# Patient Record
Sex: Female | Born: 1940 | Race: White | Hispanic: No | Marital: Single | Smoking: Former smoker
Health system: Southern US, Community
[De-identification: ages and names within clinical notes are randomized; demographics above are authoritative.]

## PROBLEM LIST (undated history)

## (undated) DIAGNOSIS — E039 Hypothyroidism, unspecified: Secondary | ICD-10-CM

## (undated) DIAGNOSIS — Z860100 Personal history of colon polyps, unspecified: Secondary | ICD-10-CM

## (undated) DIAGNOSIS — K589 Irritable bowel syndrome without diarrhea: Secondary | ICD-10-CM

## (undated) DIAGNOSIS — K5790 Diverticulosis of intestine, part unspecified, without perforation or abscess without bleeding: Secondary | ICD-10-CM

## (undated) DIAGNOSIS — D509 Iron deficiency anemia, unspecified: Secondary | ICD-10-CM

## (undated) DIAGNOSIS — E785 Hyperlipidemia, unspecified: Secondary | ICD-10-CM

## (undated) DIAGNOSIS — I10 Essential (primary) hypertension: Secondary | ICD-10-CM

## (undated) DIAGNOSIS — R7989 Other specified abnormal findings of blood chemistry: Secondary | ICD-10-CM

## (undated) DIAGNOSIS — C801 Malignant (primary) neoplasm, unspecified: Secondary | ICD-10-CM

## (undated) DIAGNOSIS — F419 Anxiety disorder, unspecified: Secondary | ICD-10-CM

## (undated) DIAGNOSIS — R945 Abnormal results of liver function studies: Secondary | ICD-10-CM

## (undated) DIAGNOSIS — M810 Age-related osteoporosis without current pathological fracture: Secondary | ICD-10-CM

## (undated) DIAGNOSIS — M1990 Unspecified osteoarthritis, unspecified site: Secondary | ICD-10-CM

## (undated) DIAGNOSIS — T7840XA Allergy, unspecified, initial encounter: Secondary | ICD-10-CM

## (undated) DIAGNOSIS — K219 Gastro-esophageal reflux disease without esophagitis: Secondary | ICD-10-CM

## (undated) DIAGNOSIS — R011 Cardiac murmur, unspecified: Secondary | ICD-10-CM

## (undated) DIAGNOSIS — H269 Unspecified cataract: Secondary | ICD-10-CM

## (undated) DIAGNOSIS — R519 Headache, unspecified: Secondary | ICD-10-CM

## (undated) DIAGNOSIS — F32A Depression, unspecified: Secondary | ICD-10-CM

## (undated) DIAGNOSIS — F329 Major depressive disorder, single episode, unspecified: Secondary | ICD-10-CM

## (undated) HISTORY — PX: GASTRIC BYPASS: SHX52

## (undated) HISTORY — DX: Cardiac murmur, unspecified: R01.1

## (undated) HISTORY — PX: BLADDER REPAIR: SHX76

## (undated) HISTORY — DX: Essential (primary) hypertension: I10

## (undated) HISTORY — DX: Iron deficiency anemia, unspecified: D50.9

## (undated) HISTORY — DX: Irritable bowel syndrome, unspecified: K58.9

## (undated) HISTORY — DX: Age-related osteoporosis without current pathological fracture: M81.0

## (undated) HISTORY — PX: ABDOMINAL HYSTERECTOMY: SUR658

## (undated) HISTORY — DX: Major depressive disorder, single episode, unspecified: F32.9

## (undated) HISTORY — DX: Unspecified osteoarthritis, unspecified site: M19.90

## (undated) HISTORY — PX: ABDOMINAL HYSTERECTOMY: SHX81

## (undated) HISTORY — DX: Unspecified cataract: H26.9

## (undated) HISTORY — PX: TONSILLECTOMY AND ADENOIDECTOMY: SUR1326

## (undated) HISTORY — DX: Gastro-esophageal reflux disease without esophagitis: K21.9

## (undated) HISTORY — PX: BREAST BIOPSY: SHX20

## (undated) HISTORY — PX: CARPAL TUNNEL RELEASE: SHX101

## (undated) HISTORY — PX: APPENDECTOMY: SHX54

## (undated) HISTORY — DX: Allergy, unspecified, initial encounter: T78.40XA

## (undated) HISTORY — DX: Personal history of colonic polyps: Z86.010

## (undated) HISTORY — PX: COLONOSCOPY: SHX174

## (undated) HISTORY — DX: Diverticulosis of intestine, part unspecified, without perforation or abscess without bleeding: K57.90

## (undated) HISTORY — DX: Anxiety disorder, unspecified: F41.9

## (undated) HISTORY — DX: Hyperlipidemia, unspecified: E78.5

## (undated) HISTORY — PX: CHOLECYSTECTOMY: SHX55

## (undated) HISTORY — DX: Other specified abnormal findings of blood chemistry: R79.89

## (undated) HISTORY — PX: POLYPECTOMY: SHX149

## (undated) HISTORY — DX: Depression, unspecified: F32.A

## (undated) HISTORY — DX: Personal history of colon polyps, unspecified: Z86.0100

## (undated) HISTORY — PX: CORONARY ANGIOPLASTY: SHX604

## (undated) HISTORY — DX: Hypothyroidism, unspecified: E03.9

## (undated) HISTORY — DX: Abnormal results of liver function studies: R94.5

## (undated) HISTORY — PX: CATARACT EXTRACTION: SUR2

---

## 1998-12-22 ENCOUNTER — Other Ambulatory Visit: Admission: RE | Admit: 1998-12-22 | Discharge: 1998-12-22 | Payer: Self-pay | Admitting: Gynecology

## 2000-03-10 ENCOUNTER — Other Ambulatory Visit: Admission: RE | Admit: 2000-03-10 | Discharge: 2000-03-10 | Payer: Self-pay | Admitting: Gynecology

## 2001-05-24 ENCOUNTER — Other Ambulatory Visit: Admission: RE | Admit: 2001-05-24 | Discharge: 2001-05-24 | Payer: Self-pay | Admitting: Gynecology

## 2002-10-29 ENCOUNTER — Other Ambulatory Visit: Admission: RE | Admit: 2002-10-29 | Discharge: 2002-10-29 | Payer: Self-pay | Admitting: Gynecology

## 2003-07-07 ENCOUNTER — Emergency Department (HOSPITAL_COMMUNITY): Admission: EM | Admit: 2003-07-07 | Discharge: 2003-07-07 | Payer: Self-pay | Admitting: Emergency Medicine

## 2003-07-07 ENCOUNTER — Encounter: Payer: Self-pay | Admitting: Emergency Medicine

## 2004-05-05 ENCOUNTER — Inpatient Hospital Stay (HOSPITAL_BASED_OUTPATIENT_CLINIC_OR_DEPARTMENT_OTHER): Admission: RE | Admit: 2004-05-05 | Discharge: 2004-05-05 | Payer: Self-pay | Admitting: *Deleted

## 2004-05-26 ENCOUNTER — Other Ambulatory Visit: Admission: RE | Admit: 2004-05-26 | Discharge: 2004-05-26 | Payer: Self-pay | Admitting: Gynecology

## 2004-06-19 ENCOUNTER — Encounter (INDEPENDENT_AMBULATORY_CARE_PROVIDER_SITE_OTHER): Payer: Self-pay | Admitting: *Deleted

## 2004-06-19 ENCOUNTER — Encounter: Admission: RE | Admit: 2004-06-19 | Discharge: 2004-06-19 | Payer: Self-pay | Admitting: Internal Medicine

## 2004-10-07 ENCOUNTER — Ambulatory Visit: Payer: Self-pay | Admitting: Internal Medicine

## 2004-10-26 ENCOUNTER — Ambulatory Visit: Payer: Self-pay | Admitting: Endocrinology

## 2004-11-04 ENCOUNTER — Ambulatory Visit: Payer: Self-pay | Admitting: Internal Medicine

## 2004-11-19 ENCOUNTER — Ambulatory Visit: Payer: Self-pay | Admitting: Family Medicine

## 2005-01-21 ENCOUNTER — Ambulatory Visit: Payer: Self-pay | Admitting: Family Medicine

## 2005-03-11 ENCOUNTER — Ambulatory Visit: Payer: Self-pay | Admitting: Family Medicine

## 2005-05-12 ENCOUNTER — Ambulatory Visit: Payer: Self-pay | Admitting: Internal Medicine

## 2005-05-12 ENCOUNTER — Other Ambulatory Visit: Admission: RE | Admit: 2005-05-12 | Discharge: 2005-05-12 | Payer: Self-pay | Admitting: Gynecology

## 2005-05-31 ENCOUNTER — Ambulatory Visit: Payer: Self-pay | Admitting: Internal Medicine

## 2005-06-04 ENCOUNTER — Ambulatory Visit: Payer: Self-pay | Admitting: Endocrinology

## 2005-06-22 ENCOUNTER — Ambulatory Visit: Payer: Self-pay | Admitting: Internal Medicine

## 2005-06-23 ENCOUNTER — Ambulatory Visit: Payer: Self-pay | Admitting: Internal Medicine

## 2005-06-23 ENCOUNTER — Encounter (INDEPENDENT_AMBULATORY_CARE_PROVIDER_SITE_OTHER): Payer: Self-pay | Admitting: Specialist

## 2005-06-23 ENCOUNTER — Encounter (INDEPENDENT_AMBULATORY_CARE_PROVIDER_SITE_OTHER): Payer: Self-pay | Admitting: *Deleted

## 2005-07-06 ENCOUNTER — Ambulatory Visit: Payer: Self-pay | Admitting: Family Medicine

## 2005-07-19 ENCOUNTER — Ambulatory Visit: Payer: Self-pay | Admitting: Family Medicine

## 2005-07-29 ENCOUNTER — Ambulatory Visit: Payer: Self-pay | Admitting: Family Medicine

## 2005-09-20 ENCOUNTER — Ambulatory Visit: Payer: Self-pay | Admitting: Internal Medicine

## 2005-11-10 ENCOUNTER — Ambulatory Visit: Payer: Self-pay | Admitting: Family Medicine

## 2005-11-18 ENCOUNTER — Ambulatory Visit: Payer: Self-pay | Admitting: Family Medicine

## 2005-11-30 ENCOUNTER — Ambulatory Visit: Payer: Self-pay

## 2006-01-04 ENCOUNTER — Other Ambulatory Visit: Admission: RE | Admit: 2006-01-04 | Discharge: 2006-01-04 | Payer: Self-pay | Admitting: Gynecology

## 2006-01-06 ENCOUNTER — Ambulatory Visit: Payer: Self-pay | Admitting: Family Medicine

## 2006-01-25 ENCOUNTER — Ambulatory Visit: Payer: Self-pay | Admitting: Family Medicine

## 2006-02-02 ENCOUNTER — Ambulatory Visit: Payer: Self-pay | Admitting: Hematology and Oncology

## 2006-03-03 LAB — UIFE/LIGHT CHAINS/TP QN, 24-HR UR
Free Kappa Lt Chains,Ur: 0.84 mg/dL (ref 0.04–1.51)
Free Kappa/Lambda Ratio: 4.94 ratio — ABNORMAL HIGH (ref 0.46–4.00)
Free Lt Chn Excr Rate: 21 mg/d
Total Protein, Urine: 1.3 mg/dL
Volume, Urine: 2500 mL

## 2006-03-18 ENCOUNTER — Ambulatory Visit: Payer: Self-pay | Admitting: Internal Medicine

## 2006-03-21 ENCOUNTER — Ambulatory Visit: Payer: Self-pay | Admitting: Hematology and Oncology

## 2006-03-21 ENCOUNTER — Ambulatory Visit (HOSPITAL_COMMUNITY): Admission: RE | Admit: 2006-03-21 | Discharge: 2006-03-21 | Payer: Self-pay | Admitting: Hematology and Oncology

## 2006-03-21 LAB — CBC WITH DIFFERENTIAL/PLATELET
BASO%: 0.5 % (ref 0.0–2.0)
Basophils Absolute: 0 10*3/uL (ref 0.0–0.1)
EOS%: 1.7 % (ref 0.0–7.0)
Eosinophils Absolute: 0.1 10*3/uL (ref 0.0–0.5)
HCT: 35.6 % (ref 34.8–46.6)
HGB: 12.1 g/dL (ref 11.6–15.9)
LYMPH%: 22.8 % (ref 14.0–48.0)
MCH: 30.1 pg (ref 26.0–34.0)
MCHC: 34.1 g/dL (ref 32.0–36.0)
MCV: 88.2 fL (ref 81.0–101.0)
MONO#: 0.4 10*3/uL (ref 0.1–0.9)
MONO%: 6.2 % (ref 0.0–13.0)
NEUT#: 4.5 10*3/uL (ref 1.5–6.5)
NEUT%: 68.8 % (ref 39.6–76.8)
Platelets: 159 10*3/uL (ref 145–400)
RBC: 4.04 10*6/uL (ref 3.70–5.32)
RDW: 15.3 % — ABNORMAL HIGH (ref 11.3–14.5)
WBC: 6.6 10*3/uL (ref 3.9–10.0)
lymph#: 1.5 10*3/uL (ref 0.9–3.3)

## 2006-03-21 LAB — IRON AND TIBC: Iron: 133 ug/dL (ref 42–145)

## 2006-03-21 LAB — FERRITIN: Ferritin: 73 ng/mL (ref 10–291)

## 2006-04-04 ENCOUNTER — Ambulatory Visit: Payer: Self-pay | Admitting: Family Medicine

## 2006-04-21 ENCOUNTER — Ambulatory Visit: Payer: Self-pay | Admitting: Family Medicine

## 2006-04-21 LAB — CBC WITH DIFFERENTIAL/PLATELET
Eosinophils Absolute: 0.1 10*3/uL (ref 0.0–0.5)
MCV: 90.2 fL (ref 81.0–101.0)
MONO%: 5.7 % (ref 0.0–13.0)
NEUT#: 4.6 10*3/uL (ref 1.5–6.5)
RBC: 4 10*6/uL (ref 3.70–5.32)
RDW: 14.6 % — ABNORMAL HIGH (ref 11.3–14.5)
WBC: 6.4 10*3/uL (ref 3.9–10.0)
lymph#: 1.4 10*3/uL (ref 0.9–3.3)

## 2006-04-21 LAB — FERRITIN: Ferritin: 77 ng/mL (ref 10–291)

## 2006-04-21 LAB — VITAMIN B12: Vitamin B-12: 482 pg/mL (ref 211–911)

## 2006-04-28 ENCOUNTER — Ambulatory Visit: Payer: Self-pay | Admitting: Family Medicine

## 2006-05-13 ENCOUNTER — Ambulatory Visit: Payer: Self-pay | Admitting: Hematology and Oncology

## 2006-05-17 LAB — CBC WITH DIFFERENTIAL/PLATELET
Eosinophils Absolute: 0.1 10*3/uL (ref 0.0–0.5)
HCT: 36.5 % (ref 34.8–46.6)
LYMPH%: 15.5 % (ref 14.0–48.0)
MCHC: 34.6 g/dL (ref 32.0–36.0)
MONO#: 0.3 10*3/uL (ref 0.1–0.9)
NEUT#: 5.5 10*3/uL (ref 1.5–6.5)
NEUT%: 77.7 % — ABNORMAL HIGH (ref 39.6–76.8)
Platelets: 155 10*3/uL (ref 145–400)
WBC: 7 10*3/uL (ref 3.9–10.0)

## 2006-06-27 ENCOUNTER — Other Ambulatory Visit: Admission: RE | Admit: 2006-06-27 | Discharge: 2006-06-27 | Payer: Self-pay | Admitting: Gynecology

## 2006-08-30 ENCOUNTER — Ambulatory Visit: Payer: Self-pay | Admitting: Endocrinology

## 2006-11-03 ENCOUNTER — Ambulatory Visit: Payer: Self-pay | Admitting: Family Medicine

## 2007-01-31 ENCOUNTER — Other Ambulatory Visit: Admission: RE | Admit: 2007-01-31 | Discharge: 2007-01-31 | Payer: Self-pay | Admitting: Gynecology

## 2007-03-14 DIAGNOSIS — J45909 Unspecified asthma, uncomplicated: Secondary | ICD-10-CM | POA: Insufficient documentation

## 2007-03-14 DIAGNOSIS — J3800 Paralysis of vocal cords and larynx, unspecified: Secondary | ICD-10-CM

## 2007-03-14 DIAGNOSIS — J384 Edema of larynx: Secondary | ICD-10-CM | POA: Insufficient documentation

## 2007-03-14 DIAGNOSIS — I10 Essential (primary) hypertension: Secondary | ICD-10-CM

## 2007-03-14 DIAGNOSIS — D126 Benign neoplasm of colon, unspecified: Secondary | ICD-10-CM | POA: Insufficient documentation

## 2007-03-14 DIAGNOSIS — E785 Hyperlipidemia, unspecified: Secondary | ICD-10-CM | POA: Insufficient documentation

## 2007-03-14 DIAGNOSIS — K219 Gastro-esophageal reflux disease without esophagitis: Secondary | ICD-10-CM

## 2007-03-14 DIAGNOSIS — Z9089 Acquired absence of other organs: Secondary | ICD-10-CM

## 2007-03-14 DIAGNOSIS — D509 Iron deficiency anemia, unspecified: Secondary | ICD-10-CM

## 2007-03-14 DIAGNOSIS — Z862 Personal history of diseases of the blood and blood-forming organs and certain disorders involving the immune mechanism: Secondary | ICD-10-CM

## 2007-03-14 DIAGNOSIS — Z8639 Personal history of other endocrine, nutritional and metabolic disease: Secondary | ICD-10-CM

## 2007-06-14 ENCOUNTER — Ambulatory Visit: Payer: Self-pay | Admitting: Internal Medicine

## 2007-06-14 DIAGNOSIS — R3989 Other symptoms and signs involving the genitourinary system: Secondary | ICD-10-CM | POA: Insufficient documentation

## 2007-06-14 DIAGNOSIS — IMO0001 Reserved for inherently not codable concepts without codable children: Secondary | ICD-10-CM

## 2007-06-15 ENCOUNTER — Encounter: Payer: Self-pay | Admitting: Family Medicine

## 2007-06-16 ENCOUNTER — Telehealth: Payer: Self-pay | Admitting: Family Medicine

## 2007-06-19 ENCOUNTER — Encounter (INDEPENDENT_AMBULATORY_CARE_PROVIDER_SITE_OTHER): Payer: Self-pay | Admitting: *Deleted

## 2007-06-20 ENCOUNTER — Ambulatory Visit: Payer: Self-pay | Admitting: Family Medicine

## 2007-06-23 ENCOUNTER — Telehealth (INDEPENDENT_AMBULATORY_CARE_PROVIDER_SITE_OTHER): Payer: Self-pay | Admitting: *Deleted

## 2007-06-23 LAB — CONVERTED CEMR LAB
Alkaline Phosphatase: 58 units/L (ref 39–117)
Basophils Absolute: 0 10*3/uL (ref 0.0–0.1)
Bilirubin, Direct: 0.1 mg/dL (ref 0.0–0.3)
Cholesterol: 160 mg/dL (ref 0–200)
Eosinophils Absolute: 0.1 10*3/uL (ref 0.0–0.6)
Eosinophils Relative: 2.2 % (ref 0.0–5.0)
HDL: 31.5 mg/dL — ABNORMAL LOW (ref >39.0)
Lymphocytes Relative: 41.3 % (ref 12.0–46.0)
MCHC: 34.7 g/dL (ref 30.0–36.0)
MCV: 91.6 fL (ref 78.0–100.0)
Neutro Abs: 1.9 10*3/uL (ref 1.4–7.7)
Platelets: 155 10*3/uL (ref 150–400)
T3, Free: 2.4 pg/mL (ref 2.3–4.2)
TSH: 0.71 microintl units/mL (ref 0.35–5.50)
Total Protein: 6.5 g/dL (ref 6.0–8.3)
Triglycerides: 108 mg/dL (ref 0–149)
WBC: 3.9 10*3/uL — ABNORMAL LOW (ref 4.5–10.5)

## 2007-07-18 ENCOUNTER — Other Ambulatory Visit: Admission: RE | Admit: 2007-07-18 | Discharge: 2007-07-18 | Payer: Self-pay | Admitting: Gynecology

## 2007-09-02 ENCOUNTER — Encounter: Payer: Self-pay | Admitting: *Deleted

## 2007-10-31 ENCOUNTER — Ambulatory Visit: Payer: Self-pay | Admitting: Family Medicine

## 2007-10-31 ENCOUNTER — Telehealth (INDEPENDENT_AMBULATORY_CARE_PROVIDER_SITE_OTHER): Payer: Self-pay | Admitting: *Deleted

## 2007-11-01 ENCOUNTER — Encounter: Payer: Self-pay | Admitting: Endocrinology

## 2008-01-23 ENCOUNTER — Telehealth (INDEPENDENT_AMBULATORY_CARE_PROVIDER_SITE_OTHER): Payer: Self-pay | Admitting: *Deleted

## 2008-01-23 ENCOUNTER — Ambulatory Visit: Payer: Self-pay | Admitting: Family Medicine

## 2008-01-23 LAB — CONVERTED CEMR LAB
Glucose, Urine, Semiquant: NEGATIVE
Ketones, urine, test strip: NEGATIVE
Nitrite: NEGATIVE
Rapid Strep: POSITIVE
WBC Urine, dipstick: NEGATIVE
pH: 6.5

## 2008-02-23 ENCOUNTER — Telehealth (INDEPENDENT_AMBULATORY_CARE_PROVIDER_SITE_OTHER): Payer: Self-pay | Admitting: *Deleted

## 2008-02-26 ENCOUNTER — Telehealth (INDEPENDENT_AMBULATORY_CARE_PROVIDER_SITE_OTHER): Payer: Self-pay | Admitting: *Deleted

## 2008-02-27 ENCOUNTER — Ambulatory Visit: Payer: Self-pay | Admitting: Internal Medicine

## 2008-05-17 ENCOUNTER — Encounter: Payer: Self-pay | Admitting: Family Medicine

## 2008-08-12 ENCOUNTER — Ambulatory Visit: Payer: Self-pay | Admitting: Vascular Surgery

## 2008-08-12 ENCOUNTER — Telehealth (INDEPENDENT_AMBULATORY_CARE_PROVIDER_SITE_OTHER): Payer: Self-pay | Admitting: *Deleted

## 2008-08-12 ENCOUNTER — Emergency Department (HOSPITAL_COMMUNITY): Admission: EM | Admit: 2008-08-12 | Discharge: 2008-08-12 | Payer: Self-pay | Admitting: Emergency Medicine

## 2008-08-12 ENCOUNTER — Encounter (INDEPENDENT_AMBULATORY_CARE_PROVIDER_SITE_OTHER): Payer: Self-pay | Admitting: Emergency Medicine

## 2008-08-13 ENCOUNTER — Telehealth: Payer: Self-pay | Admitting: Family Medicine

## 2008-08-14 ENCOUNTER — Ambulatory Visit: Payer: Self-pay | Admitting: Family Medicine

## 2008-08-14 DIAGNOSIS — R5383 Other fatigue: Secondary | ICD-10-CM | POA: Insufficient documentation

## 2008-08-14 DIAGNOSIS — R5381 Other malaise: Secondary | ICD-10-CM

## 2008-08-14 DIAGNOSIS — J984 Other disorders of lung: Secondary | ICD-10-CM | POA: Insufficient documentation

## 2008-08-14 LAB — CONVERTED CEMR LAB
ALT: 16 units/L (ref 0–35)
Alkaline Phosphatase: 54 units/L (ref 39–117)
Bilirubin, Direct: 0.1 mg/dL (ref 0.0–0.3)
CO2: 29 meq/L (ref 19–32)
Chloride: 109 meq/L (ref 96–112)
Glucose, Bld: 82 mg/dL (ref 70–99)
Lymphocytes Relative: 31.1 % (ref 12.0–46.0)
Monocytes Relative: 6.6 % (ref 3.0–12.0)
Platelets: 150 10*3/uL (ref 150–400)
Potassium: 4.7 meq/L (ref 3.5–5.1)
RDW: 12.5 % (ref 11.5–14.6)
Sodium: 140 meq/L (ref 135–145)
Total Protein: 6.8 g/dL (ref 6.0–8.3)

## 2008-08-15 ENCOUNTER — Encounter (INDEPENDENT_AMBULATORY_CARE_PROVIDER_SITE_OTHER): Payer: Self-pay | Admitting: *Deleted

## 2008-08-26 ENCOUNTER — Encounter (INDEPENDENT_AMBULATORY_CARE_PROVIDER_SITE_OTHER): Payer: Self-pay | Admitting: *Deleted

## 2008-08-26 ENCOUNTER — Telehealth (INDEPENDENT_AMBULATORY_CARE_PROVIDER_SITE_OTHER): Payer: Self-pay | Admitting: *Deleted

## 2008-09-02 ENCOUNTER — Telehealth (INDEPENDENT_AMBULATORY_CARE_PROVIDER_SITE_OTHER): Payer: Self-pay | Admitting: *Deleted

## 2008-09-10 ENCOUNTER — Ambulatory Visit: Payer: Self-pay | Admitting: Family Medicine

## 2008-09-10 DIAGNOSIS — E559 Vitamin D deficiency, unspecified: Secondary | ICD-10-CM

## 2008-09-10 DIAGNOSIS — I8390 Asymptomatic varicose veins of unspecified lower extremity: Secondary | ICD-10-CM

## 2008-10-22 ENCOUNTER — Telehealth (INDEPENDENT_AMBULATORY_CARE_PROVIDER_SITE_OTHER): Payer: Self-pay | Admitting: *Deleted

## 2008-11-14 ENCOUNTER — Ambulatory Visit: Payer: Self-pay | Admitting: Family Medicine

## 2008-11-15 ENCOUNTER — Encounter: Payer: Self-pay | Admitting: Family Medicine

## 2008-11-18 ENCOUNTER — Encounter (INDEPENDENT_AMBULATORY_CARE_PROVIDER_SITE_OTHER): Payer: Self-pay | Admitting: *Deleted

## 2009-02-25 ENCOUNTER — Telehealth (INDEPENDENT_AMBULATORY_CARE_PROVIDER_SITE_OTHER): Payer: Self-pay | Admitting: *Deleted

## 2009-02-25 DIAGNOSIS — Z9884 Bariatric surgery status: Secondary | ICD-10-CM | POA: Insufficient documentation

## 2009-02-27 ENCOUNTER — Ambulatory Visit: Payer: Self-pay | Admitting: Family Medicine

## 2009-03-08 LAB — CONVERTED CEMR LAB
Albumin: 3.9 g/dL (ref 3.5–5.2)
CO2: 30 meq/L (ref 19–32)
Chloride: 105 meq/L (ref 96–112)
Creatinine, Ser: 0.6 mg/dL (ref 0.4–1.2)
Eosinophils Relative: 2 % (ref 0.0–5.0)
HCT: 36.1 % (ref 36.0–46.0)
HDL: 36.1 mg/dL — ABNORMAL LOW (ref >39.00)
Iron: 96 ug/dL (ref 42–145)
LDL Cholesterol: 129 mg/dL — ABNORMAL HIGH (ref 0–99)
Lymphs Abs: 1.4 10*3/uL (ref 0.7–4.0)
MCV: 91.3 fL (ref 78.0–100.0)
Monocytes Absolute: 0.3 10*3/uL (ref 0.1–1.0)
Platelets: 138 10*3/uL — ABNORMAL LOW (ref 150.0–400.0)
Potassium: 4.7 meq/L (ref 3.5–5.1)
Sodium: 140 meq/L (ref 135–145)
Total CHOL/HDL Ratio: 5
Triglycerides: 142 mg/dL (ref 0.0–149.0)
VLDL: 28.4 mg/dL (ref 0.0–40.0)
Vit D, 25-Hydroxy: 38 ng/mL (ref 30–89)
Vitamin B-12: 575 pg/mL (ref 211–911)
WBC: 4.2 10*3/uL — ABNORMAL LOW (ref 4.5–10.5)

## 2009-03-10 ENCOUNTER — Encounter (INDEPENDENT_AMBULATORY_CARE_PROVIDER_SITE_OTHER): Payer: Self-pay | Admitting: *Deleted

## 2009-04-03 ENCOUNTER — Ambulatory Visit: Payer: Self-pay | Admitting: Family Medicine

## 2009-04-03 LAB — CONVERTED CEMR LAB
Basophils Relative: 0.8 % (ref 0.0–3.0)
Hemoglobin: 12.6 g/dL (ref 12.0–15.0)
Lymphocytes Relative: 30.9 % (ref 12.0–46.0)
Monocytes Relative: 4.8 % (ref 3.0–12.0)
Neutro Abs: 2.9 10*3/uL (ref 1.4–7.7)
Neutrophils Relative %: 61.7 % (ref 43.0–77.0)
RBC: 4.01 M/uL (ref 3.87–5.11)
WBC: 4.6 10*3/uL (ref 4.5–10.5)

## 2009-04-04 ENCOUNTER — Encounter (INDEPENDENT_AMBULATORY_CARE_PROVIDER_SITE_OTHER): Payer: Self-pay | Admitting: *Deleted

## 2009-04-21 ENCOUNTER — Encounter: Payer: Self-pay | Admitting: Family Medicine

## 2009-05-14 ENCOUNTER — Ambulatory Visit: Payer: Self-pay | Admitting: Internal Medicine

## 2009-05-16 ENCOUNTER — Encounter: Payer: Self-pay | Admitting: Family Medicine

## 2009-05-26 ENCOUNTER — Telehealth (INDEPENDENT_AMBULATORY_CARE_PROVIDER_SITE_OTHER): Payer: Self-pay | Admitting: *Deleted

## 2009-06-03 ENCOUNTER — Telehealth (INDEPENDENT_AMBULATORY_CARE_PROVIDER_SITE_OTHER): Payer: Self-pay | Admitting: *Deleted

## 2009-06-26 ENCOUNTER — Encounter: Payer: Self-pay | Admitting: Family Medicine

## 2009-07-02 ENCOUNTER — Ambulatory Visit: Payer: Self-pay | Admitting: Family Medicine

## 2009-07-02 ENCOUNTER — Encounter: Admission: RE | Admit: 2009-07-02 | Discharge: 2009-07-02 | Payer: Self-pay | Admitting: Family Medicine

## 2009-07-02 LAB — CONVERTED CEMR LAB
Bilirubin Urine: NEGATIVE
Blood in Urine, dipstick: NEGATIVE
Ketones, urine, test strip: NEGATIVE
Nitrite: NEGATIVE
Specific Gravity, Urine: <1.005
Urobilinogen, UA: 1

## 2009-07-03 ENCOUNTER — Encounter (INDEPENDENT_AMBULATORY_CARE_PROVIDER_SITE_OTHER): Payer: Self-pay | Admitting: *Deleted

## 2009-07-03 ENCOUNTER — Telehealth (INDEPENDENT_AMBULATORY_CARE_PROVIDER_SITE_OTHER): Payer: Self-pay | Admitting: *Deleted

## 2009-07-04 LAB — CONVERTED CEMR LAB
ALT: 153 units/L — ABNORMAL HIGH (ref 0–35)
AST: 189 units/L — ABNORMAL HIGH (ref 0–37)
Albumin: 4.1 g/dL (ref 3.5–5.2)
Amylase: 75 units/L (ref 27–131)
BUN: 9 mg/dL (ref 6–23)
Basophils Relative: 0 % (ref 0.0–3.0)
Chloride: 101 meq/L (ref 96–112)
Eosinophils Relative: 0.3 % (ref 0.0–5.0)
GFR calc non Af Amer: 106.27 mL/min (ref >60)
Glucose, Bld: 115 mg/dL — ABNORMAL HIGH (ref 70–99)
HCT: 36.6 % (ref 36.0–46.0)
Hemoglobin: 12.9 g/dL (ref 12.0–15.0)
Lipase: 30 units/L (ref 11.0–59.0)
Lymphs Abs: 0.6 10*3/uL — ABNORMAL LOW (ref 0.7–4.0)
MCV: 89.7 fL (ref 78.0–100.0)
Monocytes Absolute: 0.3 10*3/uL (ref 0.1–1.0)
Monocytes Relative: 4.9 % (ref 3.0–12.0)
Neutro Abs: 5.5 10*3/uL (ref 1.4–7.7)
Platelets: 132 10*3/uL — ABNORMAL LOW (ref 150.0–400.0)
Potassium: 3.9 meq/L (ref 3.5–5.1)
Sodium: 138 meq/L (ref 135–145)
Total Bilirubin: 1.5 mg/dL — ABNORMAL HIGH (ref 0.3–1.2)
Total Protein: 7.2 g/dL (ref 6.0–8.3)
WBC: 6.4 10*3/uL (ref 4.5–10.5)

## 2009-07-14 ENCOUNTER — Encounter: Payer: Self-pay | Admitting: Family Medicine

## 2009-07-16 ENCOUNTER — Encounter: Admission: RE | Admit: 2009-07-16 | Discharge: 2009-07-16 | Payer: Self-pay | Admitting: General Surgery

## 2009-07-16 ENCOUNTER — Ambulatory Visit: Payer: Self-pay | Admitting: Internal Medicine

## 2009-07-16 LAB — CONVERTED CEMR LAB
Bilirubin Urine: NEGATIVE
Nitrite: POSITIVE
Protein, U semiquant: NEGATIVE
Urobilinogen, UA: 0.2

## 2009-07-21 ENCOUNTER — Telehealth (INDEPENDENT_AMBULATORY_CARE_PROVIDER_SITE_OTHER): Payer: Self-pay | Admitting: *Deleted

## 2009-07-23 ENCOUNTER — Encounter (INDEPENDENT_AMBULATORY_CARE_PROVIDER_SITE_OTHER): Payer: Self-pay | Admitting: *Deleted

## 2009-07-23 ENCOUNTER — Encounter: Payer: Self-pay | Admitting: Family Medicine

## 2009-07-30 ENCOUNTER — Encounter: Payer: Self-pay | Admitting: Family Medicine

## 2009-09-03 ENCOUNTER — Telehealth (INDEPENDENT_AMBULATORY_CARE_PROVIDER_SITE_OTHER): Payer: Self-pay | Admitting: *Deleted

## 2009-09-04 ENCOUNTER — Encounter: Payer: Self-pay | Admitting: Family Medicine

## 2009-09-29 ENCOUNTER — Telehealth: Payer: Self-pay | Admitting: Family Medicine

## 2009-10-09 ENCOUNTER — Ambulatory Visit: Payer: Self-pay | Admitting: Internal Medicine

## 2009-11-19 ENCOUNTER — Ambulatory Visit: Payer: Self-pay | Admitting: Internal Medicine

## 2009-11-24 ENCOUNTER — Encounter (INDEPENDENT_AMBULATORY_CARE_PROVIDER_SITE_OTHER): Payer: Self-pay | Admitting: *Deleted

## 2009-11-25 LAB — CONVERTED CEMR LAB: TSH: 0.59 microintl units/mL (ref 0.35–5.50)

## 2009-12-03 ENCOUNTER — Telehealth (INDEPENDENT_AMBULATORY_CARE_PROVIDER_SITE_OTHER): Payer: Self-pay | Admitting: *Deleted

## 2010-01-14 ENCOUNTER — Ambulatory Visit: Payer: Self-pay | Admitting: Internal Medicine

## 2010-01-14 DIAGNOSIS — E039 Hypothyroidism, unspecified: Secondary | ICD-10-CM

## 2010-01-29 ENCOUNTER — Telehealth (INDEPENDENT_AMBULATORY_CARE_PROVIDER_SITE_OTHER): Payer: Self-pay | Admitting: *Deleted

## 2010-02-16 ENCOUNTER — Telehealth: Payer: Self-pay | Admitting: Family Medicine

## 2010-02-16 DIAGNOSIS — J312 Chronic pharyngitis: Secondary | ICD-10-CM | POA: Insufficient documentation

## 2010-02-20 ENCOUNTER — Encounter: Payer: Self-pay | Admitting: Family Medicine

## 2010-03-09 ENCOUNTER — Encounter: Payer: Self-pay | Admitting: Family Medicine

## 2010-03-17 ENCOUNTER — Encounter: Payer: Self-pay | Admitting: Internal Medicine

## 2010-06-04 ENCOUNTER — Telehealth (INDEPENDENT_AMBULATORY_CARE_PROVIDER_SITE_OTHER): Payer: Self-pay | Admitting: *Deleted

## 2010-06-09 ENCOUNTER — Encounter (INDEPENDENT_AMBULATORY_CARE_PROVIDER_SITE_OTHER): Payer: Self-pay | Admitting: *Deleted

## 2010-08-05 ENCOUNTER — Observation Stay (HOSPITAL_COMMUNITY): Admission: EM | Admit: 2010-08-05 | Discharge: 2010-08-06 | Payer: Self-pay | Admitting: Emergency Medicine

## 2010-08-10 ENCOUNTER — Encounter: Payer: Self-pay | Admitting: Internal Medicine

## 2010-08-26 ENCOUNTER — Ambulatory Visit: Payer: Self-pay | Admitting: Internal Medicine

## 2010-10-13 ENCOUNTER — Ambulatory Visit: Payer: Self-pay | Admitting: Family Medicine

## 2010-10-13 ENCOUNTER — Telehealth: Payer: Self-pay | Admitting: Family Medicine

## 2010-10-13 ENCOUNTER — Encounter (INDEPENDENT_AMBULATORY_CARE_PROVIDER_SITE_OTHER): Payer: Self-pay | Admitting: *Deleted

## 2010-10-13 DIAGNOSIS — R197 Diarrhea, unspecified: Secondary | ICD-10-CM

## 2010-10-13 DIAGNOSIS — F411 Generalized anxiety disorder: Secondary | ICD-10-CM | POA: Insufficient documentation

## 2010-10-13 DIAGNOSIS — R233 Spontaneous ecchymoses: Secondary | ICD-10-CM

## 2010-10-14 ENCOUNTER — Encounter: Payer: Self-pay | Admitting: Family Medicine

## 2010-10-18 LAB — CONVERTED CEMR LAB
ALT: 14 units/L (ref 0–35)
AST: 20 units/L (ref 0–37)
Albumin: 4 g/dL (ref 3.5–5.2)
BUN: 14 mg/dL (ref 6–23)
Basophils Absolute: 0 10*3/uL (ref 0.0–0.1)
Chloride: 100 meq/L (ref 96–112)
Eosinophils Relative: 1.7 % (ref 0.0–5.0)
GFR calc non Af Amer: 84.42 mL/min (ref >60)
Glucose, Bld: 78 mg/dL (ref 70–99)
HCT: 34.4 % — ABNORMAL LOW (ref 36.0–46.0)
Hemoglobin: 11.8 g/dL — ABNORMAL LOW (ref 12.0–15.0)
Lymphs Abs: 1.8 10*3/uL (ref 0.7–4.0)
MCV: 91.9 fL (ref 78.0–100.0)
Monocytes Absolute: 0.3 10*3/uL (ref 0.1–1.0)
Monocytes Relative: 6.7 % (ref 3.0–12.0)
Neutro Abs: 2.8 10*3/uL (ref 1.4–7.7)
Platelets: 187 10*3/uL (ref 150.0–400.0)
Potassium: 4.2 meq/L (ref 3.5–5.1)
Prothrombin Time: 10.3 s (ref 9.7–11.8)
RDW: 13.9 % (ref 11.5–14.6)
Sodium: 134 meq/L — ABNORMAL LOW (ref 135–145)
TSH: 0.73 microintl units/mL (ref 0.35–5.50)

## 2010-11-10 ENCOUNTER — Ambulatory Visit: Payer: Self-pay | Admitting: Family Medicine

## 2010-11-11 ENCOUNTER — Telehealth: Payer: Self-pay | Admitting: Family Medicine

## 2010-12-02 ENCOUNTER — Ambulatory Visit: Admit: 2010-12-02 | Payer: Self-pay | Admitting: Internal Medicine

## 2010-12-17 ENCOUNTER — Telehealth: Payer: Self-pay | Admitting: Family Medicine

## 2010-12-29 NOTE — Progress Notes (Signed)
Summary: Phone-synthroid  Phone Note Call from Patient   Caller: other/Pharmacy Summary of Call: Patient is at the pharmacy stating her prescription for synthroid is suppose to be changed to a new strenght. She has some blood work done.on Dec 22,2011. Patient is requesting a call back. Pharmacy is [phone] and fax # [phone] Initial call taken by: Barb Merino,  December 03, 2009 4:38 PM  Follow-up for Phone Call        spoke w/ pharmacy per Hop recheck labs in 8 weeks..........Marland KitchenDoristine Devoid  December 03, 2009 4:58 PM     New/Updated Medications: SYNTHROID 125 MCG TABS (LEVOTHYROXINE SODIUM) 1 by mouth once daily except 1/2 on wed. Prescriptions: SYNTHROID 125 MCG TABS (LEVOTHYROXINE SODIUM) 1 by mouth once daily except 1/2 on wed.  #30 x 0   Entered by:   Doristine Devoid   Authorized by:   Marga Melnick MD   Signed by:   Doristine Devoid on 12/03/2009   Method used:   Electronically to        [facility]* (retail)       [address]       [place], [place]  [address]       Ph: [phone]       Fax: [phone]   RxID:   [id number]

## 2010-12-29 NOTE — Assessment & Plan Note (Signed)
Summary: digestion problems; bruising per ob-gyn///sph   Vital Signs:  Patient profile:   70 year old female Weight:      171.4 pounds Temp:     98.2 degrees F oral Pulse rate:   64 / minute Pulse rhythm:   regular BP sitting:   110 / 64  (left arm) Cuff size:   large  Vitals Entered By: Almeta Monas CMA Duncan Dull) (October 13, 2010 3:44 PM) CC: c/o anxiety, feeling exhausted, bruising and diarrhea   History of Present Illness: Pt here with multiple complaints.  Pt is still c/o diarrhea ---see last visit.  She has spoken with [facility] (gastric bypass) and they told her to take Immodium and speak to GB surgeon---gb taken out 1 year ago---but she has not spoken to her yet.  Pt did have diarrhea before gb out but not to the extreme it has been since GB surgery.   Pt is also due for colonoscopy.  Pt is also c/o increased anxiety since children and grandchildren have moved back. --- Her children have said she needs to do something about it because she is too nervous around grandchildren.     GyN noticed bruising and asked her to come here to have platlets checked.     Current Medications (verified): 1)  Prilosec Otc 20 Mg Tbec (Omeprazole Magnesium) .... Take 2 Tablets Daily As   Directed To Protectstomach Lining. 2)  Clarinex 5 Mg  Tabs (Desloratadine) .Marland Kitchen.. 1 By Mouth Once Daily Prn 3)  Synthroid 125 Mcg Tabs (Levothyroxine Sodium) .Marland Kitchen.. 1 By Mouth Once Daily . 4)  Proventil Hfa 108 (90 Base) Mcg/act  Aers (Albuterol Sulfate) .... 2 Puffs Qid As Needed 5)  Vitamin D 1000 Unit Tabs (Cholecalciferol) .Marland Kitchen.. 1 By Mouth Once Daily 6)  Symbicort 160-4.5 Mcg/act Aero (Budesonide-Formoterol Fumarate) .Marland Kitchen.. 1-2 Puffs Q 12 Hrs As Needed 7)  Gas-X Extra Strength 62.5 Mg Strp (Simethicone) .... As Needed 8)  Tylenol 325 Mg Tabs (Acetaminophen) .... As Needed 9)  Mucinex 600 Mg Xr12h-Tab (Guaifenesin) .Marland Kitchen.. 1 By Mouth Once Daily 10)  Vitamin B-12 250 Mcg Tabs (Cyanocobalamin) .Marland Kitchen.. 1 By Mouth Once Daily 11)   Multivitamins  Tabs (Multiple Vitamin) .Marland Kitchen.. 1 By Mouth Once Daily 12)  Hyoscyamine Sulfate 0.125 Mg Subl (Hyoscyamine Sulfate) .Marland Kitchen.. 1 Under [place] Every 6 Hrs As Needed For Cramping 13)  Celexa 20 Mg Tabs (Citalopram Hydrobromide) .Marland Kitchen.. 1 By Mouth Once Daily 14)  Alprazolam 0.25 Mg Tabs (Alprazolam) .Marland Kitchen.. 1 By Mouth Three Times A Day As Needed  Allergies (verified): 1)  ! * Seconal 2)  ! Codeine 3)  ! * Aqueous Penicillin 4)  ! * Singulair 5)  ! * Actos 6)  ! * Avandia 7)  ! 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) TONSILLECTOMY AND ADENOIDECTOMY, HX OF (ICD-V45.79) LIVER FUNCTION TESTS, ABNORMAL, HX OF (ICD-V12.2) HYPERTENSION (ICD-401.9) HYPERLIPIDEMIA (ICD-272.4) DIABETES MELLITUS, TYPE II (ICD-250.00) ASTHMA (ICD-493.90) ANEMIA-IRON DEFICIENCY (ICD-280.9)  Past Surgical History: Last updated: 09/02/2007 Hysterectomy Appendectomy Gastric bypass Percutaneous transluminal coronary angioplasty  Risk Factors: Smoking Status: quit (09/02/2007)  Review of Systems      See HPI  Physical Exam  General:  Well-developed,well-nourished,in no acute distress;  alert,appropriate and cooperative throughout examination Abdomen:  soft and non-tender.   Skin:  Intact without suspicious lesions or rashes Psych:  Oriented X3 and normally interactive.     Impression & Recommendations:  Problem # 1:  DIARRHEA, CHRONIC (ICD-787.91)  Orders: Venipuncture (21308) TLB-BMP (Basic Metabolic Panel-BMET) (80048-METABOL) TLB-CBC Platelet  - w/Differential (85025-CBCD) TLB-Hepatic/Liver Function Pnl (80076-HEPATIC) TLB-TSH (Thyroid Stimulating Hormone) (84443-TSH) TLB-PT (Protime) (85610-PTP) TLB-PTT (85730-PTTL) T-Culture, C-Diff Toxin A/B (65784-69629) Specimen Handling (52841) Gastroenterology Referral (GI)  Discussed symptom control and diet. Call if worsening of symptoms or signs of dehydration.   Problem # 2:  SPONTANEOUS ECCHYMOSES (ICD-782.7) Assessment: Improved  Orders: Venipuncture (32440) TLB-BMP (Basic Metabolic Panel-BMET) (80048-METABOL) TLB-CBC Platelet - w/Differential (85025-CBCD) TLB-Hepatic/Liver Function Pnl (80076-HEPATIC) TLB-TSH (Thyroid Stimulating Hormone) (84443-TSH) TLB-PT (Protime) (85610-PTP) TLB-PTT (85730-PTTL) Specimen Handling ([address])  Problem # 3:  GERD (ICD-530.81)  Her updated medication list for this problem includes:    Prilosec Otc 20 Mg Tbec (Omeprazole magnesium) .Marland Kitchen... Take 2 tablets daily as   directed to protectstomach lining.    Hyoscyamine Sulfate 0.125 Mg Subl (Hyoscyamine sulfate) .Marland Kitchen... 1 under tonge every 6 hrs as needed for cramping  Diagnostics Reviewed:  EGD: Location: [facility]  GERD (06/23/2005) Discussed lifestyle modifications, diet, antacids/medications, and preventive measures. Handout provided.   Problem # 4:  ANXIETY STATE, UNSPECIFIED (ICD-300.00)  Her updated medication list for this problem includes:    Celexa 20 Mg Tabs (Citalopram hydrobromide) .Marland Kitchen... 1 by mouth once daily    Alprazolam 0.25 Mg Tabs (Alprazolam) .Marland Kitchen... 1 by mouth three times a day as needed  Discussed medication use and relaxation techniques.   Orders: Prescription Created Electronically [phone]  Complete Medication List: 1)  Prilosec Otc 20 Mg Tbec (Omeprazole magnesium) .... Take 2 tablets daily as   directed to protectstomach lining. 2)  Clarinex 5 Mg Tabs (Desloratadine) .Marland Kitchen.. 1 by mouth once daily prn 3)  Synthroid 125 Mcg Tabs (Levothyroxine sodium) .Marland Kitchen..  1 by mouth once daily . 4)  Proventil Hfa 108 (90 Base) Mcg/act Aers (Albuterol sulfate) .... 2 puffs qid as needed 5)  Vitamin D 1000 Unit Tabs (cholecalciferol)  .Marland Kitchen.. 1 by mouth once daily 6)  Symbicort 160-4.5 Mcg/act Aero (Budesonide-formoterol fumarate) .Marland Kitchen.. 1-2 puffs q 12 hrs as needed 7)  Gas-x Extra Strength 62.5 Mg Strp (Simethicone) .... As needed 8)  Tylenol 325 Mg Tabs (Acetaminophen) .... As needed 9)  Mucinex 600 Mg Xr12h-tab (Guaifenesin) .Marland Kitchen.. 1 by mouth once daily 10)  Vitamin B-12 250 Mcg Tabs (Cyanocobalamin) .Marland Kitchen.. 1 by mouth once daily 11)  Multivitamins Tabs (Multiple vitamin) .Marland Kitchen.. 1 by mouth once daily 12)  Hyoscyamine Sulfate 0.125 Mg Subl (Hyoscyamine sulfate) .Marland Kitchen.. 1 under tonge every 6 hrs as needed for cramping 13)  Celexa 20 Mg Tabs (Citalopram hydrobromide) .Marland Kitchen.. 1 by mouth once daily 14)  Alprazolam 0.25 Mg Tabs (Alprazolam) .Marland Kitchen.. 1 by mouth three times a day as needed  Patient Instructions: 1)  take 1/2 celexa daily for 8 days then increase to 1 tab a day 2)  Please schedule a follow-up appointment in 1 month.  Prescriptions: SYMBICORT 160-4.5 MCG/ACT AERO (BUDESONIDE-FORMOTEROL FUMARATE) 1-2 puffs q 12 hrs as needed  #1 x 5   Entered and Authorized by:   Loreen Freud DO   Signed by:   Loreen Freud DO on 10/13/2010   Method used:   Electronically to        [facility]* (retail)       [address]  [place], [place]  [address]       Ph: [phone]       Fax: [phone]   RxID:   [id number] PROVENTIL HFA 108 (90 BASE) MCG/ACT  AERS (ALBUTEROL SULFATE) 2 puffs qid as needed  #1 x 1   Entered and Authorized by:   Loreen Freud DO   Signed by:   Loreen Freud DO on 10/13/2010   Method used:   Electronically to        [facility]* (retail)       [address]       [place], [place]  [id number]       Ph: [phone]       Fax: [phone]   RxID:   [id number] ALPRAZOLAM 0.25 MG TABS (ALPRAZOLAM) 1 by mouth three  times a day as needed  #60 x 0   Entered and Authorized by:   Loreen Freud DO   Signed by:   Loreen Freud DO on 10/13/2010   Method used:   Print then Give to Patient   RxID:   [id number] CELEXA 20 MG TABS (CITALOPRAM HYDROBROMIDE) 1 by mouth once daily  #30 x 1   Entered and Authorized by:   Loreen Freud DO   Signed by:   Loreen Freud DO on 10/13/2010   Method used:   Electronically to        [facility]* (retail)       [address]       [place], [place]  [address]       Ph: [phone]       Fax: [phone]   RxID:   [id number]    Orders Added: 1)  Venipuncture [93818] 2)  TLB-BMP (Basic Metabolic Panel-BMET) [80048-METABOL] 3)  TLB-CBC Platelet - w/Differential [85025-CBCD] 4)  TLB-Hepatic/Liver Function Pnl [80076-HEPATIC] 5)  TLB-TSH (Thyroid Stimulating Hormone) [84443-TSH] 6)  TLB-PT (Protime) [85610-PTP] 7)  TLB-PTT [85730-PTTL] 8)  T-Culture, C-Diff Toxin A/B [29937-16967] 9)  Specimen Handling [99000] 10)  Gastroenterology Referral [GI] 11)  Est. Patient Level III [89381] 12)  Prescription Created Electronically [phone] [phone]

## 2010-12-29 NOTE — Consult Note (Signed)
Summary: [facility]  [facility]   Imported By: Lanelle Bal 03/23/2010 13:11:52  _____________________________________________________________________  External Attachment:    Type:   Image     Comment:   External Document

## 2010-12-29 NOTE — Progress Notes (Signed)
Summary: -no better  Phone Note Call from Patient Call back at Home Phone [phone]   Caller: Patient Summary of Call: Pt was seen for sinius infection on 01-14-10 and is no bette . pt still c/o throat irrational and chest congestion.pt was rx CEFUROXIME AXETIL 500 MG...............Marland KitchenFelecia Deloach CMA  January 29, 2010 4:08 PM   Follow-up for Phone Call        per dr hopper METRONIDAZOLE 500 MG #21...............Marland KitchenFelecia Deloach CMA  January 29, 2010 4:33 PM   left message to call office...............Marland KitchenFelecia Deloach CMA  January 29, 2010 4:34 PM   pt aware rx sent to pharmacy...........Marland KitchenFelecia Deloach CMA  January 29, 2010 4:39 PM     New/Updated Medications: METRONIDAZOLE 500 MG TABS (METRONIDAZOLE) Take 1 tab three times a day Prescriptions: METRONIDAZOLE 500 MG TABS (METRONIDAZOLE) Take 1 tab three times a day  #21 x 0   Entered by:   Jeremy Johann CMA   Authorized by:   Marga Melnick MD   Signed by:   Jeremy Johann CMA on 01/29/2010   Method used:   Faxed to ...       [facility]* (retail)       [address]       [place], [place]  [address]       Ph: [phone]       Fax: [phone]   RxID:   [id number]

## 2010-12-29 NOTE — Progress Notes (Signed)
Summary: chest pains  Phone Note Call from Patient Call back at [facility] Phone [phone]   Caller: Patient Summary of Call: Dr. Alwyn Ren Patient called yesterday with chest pains.... and it was recommended to her that she go to the ED. She went yesterday and was told to follow up today or tomorrow with her doctor. Michon is still having chest pains today. Initial call taken by: Charolette Child,  August 13, 2008 10:42 AM  Follow-up for Phone Call        Spoke with patient, still with chest pain-same as yesterday and headache. Patient was told to follow-up with primary with-in a day or two (Dr.Hopper is out of the office this week). No avaliable  appointments today. Patient was instructed to return to Emergency Room being that she is still in pain. Patient refused and said she was just there and was told all reports normal. Patient would like to see Dr.Lowne (patient seen Dr.Lowne before). Records from hospital printed and placed on ledge for Dr.Lowne to review . Per Marcelino Duster ok to place patient in 11:15 slot for tomorrow. Follow-up by: Shonna Chock,  August 13, 2008 10:57 AM  Additional Follow-up for Phone Call Additional follow up Details #1::        Spoke with patient again, Dr.Lowne still in room with patient. Patient was instructed to return to emergency room again and refused. Patient said she is ok to see Dr.Lowne @ 11:15am tomorrow. I will still have Dr.Lowne review phone note and records from the hospital and if futher information/instruction given I will call patient back. Dr.Lowne please review. Additional Follow-up by: Shonna Chock,  August 13, 2008 11:02 AM    Additional Follow-up for Phone Call Additional follow up Details #2::    If pt refusing to go back to ER we will see her in the office. Follow-up by: Loreen Freud DO,  August 13, 2008 1:02 PM

## 2010-12-29 NOTE — Progress Notes (Signed)
Summary: REFILL REQUEST  Phone Note Refill Request Call back at [phone] Message from:  Pharmacy on June 04, 2010 2:36 PM  Refills Requested: Medication #1:  SYNTHROID 125 MCG TABS 1 by mouth once daily .   Dosage confirmed as above?Dosage Confirmed   Supply Requested: 3 months   Last Refilled: 03/17/2010 [facility]  Next Appointment Scheduled: NONE Initial call taken by: Lavell Islam,  June 04, 2010 2:36 PM    Prescriptions: SYNTHROID 125 MCG TABS (LEVOTHYROXINE SODIUM) 1 by mouth once daily .  #90 x 1   Entered by:   Jeremy Johann CMA   Authorized by:   Loreen Freud DO   Signed by:   Jeremy Johann CMA on 06/04/2010   Method used:   Re-Faxed to ...       [facility]* (retail)       [address]       [place], [place]  [address]       Ph: [phone]       Fax: [phone]   RxID:   [id number]

## 2010-12-29 NOTE — Letter (Signed)
Summary: New Patient letter  [facility]  [address] [place], [place] [address]   Phone: [phone]  Fax: [phone]       10/13/2010 MRN: [id number]  Stacy Ayers [address] [place], [place]  [address]  Dear [name],  Welcome to the Gastroenterology Division at [facility].    You are scheduled to see Dr. Marina Goodell on 12/02/2010 at 9:15AM on the 3rd floor at [facility], [address]  We ask that you try to arrive at our office 15 minutes prior to your appointment time to allow for check-in.  We would like you to complete the enclosed self-administered evaluation form prior to your visit and bring it with you on the day of your appointment.  We will review it with you.  Also, please bring a complete list of all your medications or, if you prefer, bring the medication bottles and we will list them.  Please bring your insurance card so that we may make a copy of it.  If your insurance requires a referral to see a specialist, please bring your referral form from your primary care physician.  Co-payments are due at the time of your visit and may be paid by cash, check or credit card.     Your office visit will consist of a consult with your physician (includes a physical exam), any laboratory testing he/she may order, scheduling of any necessary diagnostic testing (e.g. x-ray, ultrasound, CT-scan), and scheduling of a procedure (e.g. Endoscopy, Colonoscopy) if required.  Please allow enough time on your schedule to allow for any/all of these possibilities.    If you cannot keep your appointment, please call [phone] to cancel or reschedule prior to your appointment date.  This allows [place] the opportunity to schedule an appointment for another patient in need of care.  If you do not cancel or reschedule by 5 p.m. the business day prior to your appointment date, you will be charged a $50.00 late cancellation/no-show fee.    Thank you for choosing [facility] for your medical needs.  We appreciate the opportunity to care for you.  Please visit [place] at our website  to learn more about our practice.                     Sincerely,                                                             The Gastroenterology Division

## 2010-12-29 NOTE — Assessment & Plan Note (Signed)
Summary: HOSP FOLLOW UP//PH   Vital Signs:  Patient profile:   70 year old female Weight:      168.4 pounds BMI:     25.70 Temp:     98.8 degrees F oral Pulse rate:   64 / minute Resp:     15 per minute BP sitting:   122 / 84  (left arm) Cuff size:   large  Vitals Entered By: Shonna Chock CMA (August 26, 2010 1:14 PM) CC: Hospital Follow-up, Heartburn, Diarrhea   Primary Care Provider:  [facility]  CC:  Hospital Follow-up, Heartburn, and Diarrhea.  History of Present Illness:    D/C Summary reviewed ; symptoms lasted 15 min & occurred  45-60 min after large meal. No recurrence since D/C despite Omeprazole once daily only rather than two times a day as Rxed.  The patient reports occasional  trouble swallowing, but denies acid reflux, sour taste in mouth, epigastric pain, and chest pain.  The patient denies the following alarm features: melena, frank dysphagia, hematemesis, and vomiting.        The patient also presents with Diarrhea intermittently since bypass surgery 2007, but worse after cholecystectomy 06/2009.  The patient reports 3 stools or less per day, watery/unformed stools, voluminous stools, greasy stools, malodorous stools, fecal urgency,  fecal soiling X 1, bloating, gassiness, and gradual onset of symptoms, but denies mucus in stool and fasting diarrhea.  Associated symptoms include abdominal cramps and weight loss.  The patient denies fever, lightheadedness, increased thirst, joint pains, mouth ulcers, and eye redness.  The symptoms are worse  especially after b'fast. The symptoms are better with hypomotility agents (Immodium AD) & Gas-Ex.  Patient has a  history of cholecystectomy and gastric  bypass surgery as noted.    Current Medications (verified): 1)  Prilosec Otc 20 Mg Tbec (Omeprazole Magnesium) .... Take 1 Tablet Daily As   Directed To Protectstomach Lining. 2)  Clarinex 5 Mg  Tabs (Desloratadine) .Marland Kitchen.. 1 By Mouth Once Daily Prn 3)  Synthroid 125 Mcg Tabs  (Levothyroxine Sodium) .Marland Kitchen.. 1 By Mouth Once Daily . 4)  Proventil Hfa 108 (90 Base) Mcg/act  Aers (Albuterol Sulfate) .... 2 Puffs Qid As Needed 5)  Vitamin D 2000 Unit Tabs (Cholecalciferol) .Marland Kitchen.. 1 By Mouth Once Daily 6)  Symbicort 160-4.5 Mcg/act Aero (Budesonide-Formoterol Fumarate) .Marland Kitchen.. 1-2 Puffs Q 12 Hrs As Needed 7)  Gas-X Extra Strength 62.5 Mg Strp (Simethicone) .... As Needed 8)  Tylenol 325 Mg Tabs (Acetaminophen) .... As Needed 9)  Mucinex 600 Mg Xr12h-Tab (Guaifenesin) .Marland Kitchen.. 1 By Mouth Once Daily 10)  Vitamin B-12 250 Mcg Tabs (Cyanocobalamin) .Marland Kitchen.. 1 By Mouth Once Daily 11)  Multivitamins  Tabs (Multiple Vitamin) .Marland Kitchen.. 1 By Mouth Once Daily  Allergies: 1)  ! * Seconal 2)  ! Codeine 3)  ! * Aqueous Penicillin 4)  ! * Singulair 5)  ! * Actos 6)  ! * Avandia 7)  ! Doxycycline 8)  Sulfa 9)  * Cocaine  Physical Exam  General:  well-nourished,in no acute distress; alert,appropriate and cooperative throughout examination Head:  Minor boss R occiput Eyes:  No corneal or conjunctival inflammation noted.Perrla.  No icterus Mouth:  Oral mucosa and oropharynx without lesions or exudates.  Teeth in good repair. No pharyngeal erythema.   Lungs:  Normal respiratory effort, chest expands symmetrically. Lungs are clear to auscultation, no crackles or wheezes. Heart:  regular rhythm, no gallop, no rub, no JVD, no HJR, bradycardia, and grade 1 /6 systolic murmur.  Abdomen:  Bowel sounds positive,abdomen soft and non-tender without masses, organomegaly or hernias noted. Dullness RUQ Pulses:  R and L carotid,radial,dorsalis pedis and posterior tibial pulses are full and equal bilaterally Extremities:  No clubbing, cyanosis, edema. Neurologic:  alert & oriented X3.   Skin:  Intact without suspicious lesions or rashes. No jaundice. Minimal tenting. Abrasion L upper back Cervical Nodes:  No lymphadenopathy noted Axillary Nodes:  No palpable lymphadenopathy Psych:  memory intact for recent and  remote, normally interactive, and good eye contact.     Impression & Recommendations:  Problem # 1:  ABDOMINAL PAIN (ICD-789.00)  resolved Her updated medication list for this problem includes:    Gas-x Extra Strength 62.5 Mg Strp (Simethicone) .Marland Kitchen... As needed  Orders: Prescription Created Electronically [phone])  Problem # 2:  GERD (ICD-530.81)  Her updated medication list for this problem includes:    Prilosec Otc 20 Mg Tbec (Omeprazole magnesium) .Marland Kitchen... Take 1 tablet daily as   directed to protectstomach lining.    Hyoscyamine Sulfate 0.125 Mg Subl (Hyoscyamine sulfate) .Marland Kitchen... 1 under tonge every 6 hrs as needed for cramping  Problem # 3:  DIARRHEA (ICD-787.91) intermittent  Complete Medication List: 1)  Prilosec Otc 20 Mg Tbec (Omeprazole magnesium) .... Take 1 tablet daily as   directed to protectstomach lining. 2)  Clarinex 5 Mg Tabs (Desloratadine) .Marland Kitchen.. 1 by mouth once daily prn 3)  Synthroid 125 Mcg Tabs (Levothyroxine sodium) .Marland Kitchen.. 1 by mouth once daily . 4)  Proventil Hfa 108 (90 Base) Mcg/act Aers (Albuterol sulfate) .... 2 puffs qid as needed 5)  Vitamin D 2000 Unit Tabs (Cholecalciferol) .Marland Kitchen.. 1 by mouth once daily 6)  Symbicort 160-4.5 Mcg/act Aero (Budesonide-formoterol fumarate) .Marland Kitchen.. 1-2 puffs q 12 hrs as needed 7)  Gas-x Extra Strength 62.5 Mg Strp (Simethicone) .... As needed 8)  Tylenol 325 Mg Tabs (Acetaminophen) .... As needed 9)  Mucinex 600 Mg Xr12h-tab (Guaifenesin) .Marland Kitchen.. 1 by mouth once daily 10)  Vitamin B-12 250 Mcg Tabs (Cyanocobalamin) .Marland Kitchen.. 1 by mouth once daily 11)  Multivitamins Tabs (Multiple vitamin) .Marland Kitchen.. 1 by mouth once daily 12)  Hyoscyamine Sulfate 0.125 Mg Subl (Hyoscyamine sulfate) .Marland Kitchen.. 1 under tonge every 6 hrs as needed for cramping  Other Orders: Flu Vaccine 42yrs + MEDICARE PATIENTS (U0454) Administration Flu vaccine - MCR (U9811)  Patient Instructions: 1)  Keep Diary to establish any food triggers .Align once daily until bowels are  normal. 2)  Avoid foods high in acid (tomatoes, citrus juices, spicy foods). Avoid eating within two hours of lying down or before exercising. Do not over eat; try smaller more frequent meals. Elevate head of bed twelve inches when sleeping. Prescriptions: HYOSCYAMINE SULFATE 0.125 MG SUBL (HYOSCYAMINE SULFATE) 1 under tonge every 6 hrs as needed for cramping  #30 x 0   Entered and Authorized by:   Marga Melnick MD   Signed by:   Marga Melnick MD on 08/26/2010   Method used:   Faxed to ...       [facility]* (retail)       [address]       [place], [place]  [address]       Ph: [phone]       Fax: [phone]   RxID:   [id number]  Flu Vaccine Consent Questions     Do you have a history of severe allergic reactions to this vaccine? no    Any prior history of allergic reactions to egg and/or gelatin? no  Do you have a sensitivity to the preservative Thimersol? no    Do you have a past history of Guillan-Barre Syndrome? no    Do you currently have an acute febrile illness? no    Have you ever had a severe reaction to latex? no    Vaccine information given and explained to patient? yes    Are you currently pregnant? no    Lot Number:[id number]   Exp Date:05/29/2011   Site Given  Left Deltoid IM   Immunization History:  Pneumovax Immunization History:    Pneumovax:  historical (08/05/2010)  .lbmedflu

## 2010-12-29 NOTE — Progress Notes (Signed)
  Phone Note From Other Clinic   Caller: lab Call For: lowne Summary of Call: lab called after hours ---INR 1.0  and platlets are normal----rest of labs pending Initial call taken by: Loreen Freud DO,  October 13, 2010 9:16 PM

## 2010-12-29 NOTE — Assessment & Plan Note (Signed)
Summary: sinus infection/laryngitis/kdc   Vital Signs:  Patient profile:   70 year old female Weight:      173.6 pounds Temp:     98.9 degrees F oral Pulse rate:   64 / minute Resp:     14 per minute BP sitting:   116 / 70  (left arm) Cuff size:   large  Vitals Entered By: Shonna Chock (January 14, 2010 10:53 AM) CC: Sinus Infection x 3 days Comments REVIEWED MED LIST, PATIENT AGREED DOSE AND INSTRUCTION CORRECT    Primary Care Provider:  [facility]  CC:  Sinus Infection x 3 days.  History of Present Illness: Onset 01/09/2010 as ST followed by head congestion with green secretions by 02/13. Exposed to grandddaughter with URI  1 week before. Rx: Mucinex, Clarinex , Neti pot   Allergies: 1)  ! * Seconal 2)  ! Codeine 3)  ! * Aqueous Penicillin 4)  ! * Singulair 5)  ! * Actos 6)  ! * Avandia 7)  ! Doxycycline 8)  Sulfa 9)  * Cocaine  Review of Systems General:  Denies chills, fever, and sweats. ENT:  Complains of earache and nasal congestion; denies ear discharge and sinus pressure; No frontal headache or facial pain. Resp:  Complains of cough and wheezing; denies shortness of breath and sputum productive; Wheezing only 1 am.  Physical Exam  General:  in no acute distress; alert,appropriate and cooperative throughout examination Ears:  External ear exam shows no significant lesions or deformities.  Otoscopic examination reveals clear canals, tympanic membranes are intact bilaterally without bulging, retraction, inflammation or discharge. Hearing is grossly normal bilaterally. Nose:  External nasal examination shows no deformity or inflammation. Nasal mucosa are pink and moist without lesions or exudates.  Mouth:  Oral mucosa and oropharynx without lesions or exudates.  Teeth in good repair. Slightly hoarse Lungs:  Normal respiratory effort, chest expands symmetrically. Lungs are clear to auscultation, no crackles or wheezes. Cervical Nodes:  No lymphadenopathy noted Axillary  Nodes:  No palpable lymphadenopathy   Impression & Recommendations:  Problem # 1:  SINUSITIS- ACUTE-NOS (ICD-461.9)  The following medications were removed from the medication list:    Mucinex 600 Mg Xr12h-tab (Guaifenesin) .Marland Kitchen... As needed    Azithromycin 250 Mg Tabs (Azithromycin) .Marland Kitchen... As per pack Her updated medication list for this problem includes:    Mucinex 600 Mg Xr12h-tab (Guaifenesin) .Marland Kitchen... 1 by mouth once daily    Cefuroxime Axetil 500 Mg Tabs (Cefuroxime axetil) .Marland Kitchen... 1 two times a day  Orders: Prescription Created Electronically [id number])  Problem # 2:  HYPOTHYROIDISM (ICD-244.9)  Her updated medication list for this problem includes:    Synthroid 125 Mcg Tabs (Levothyroxine sodium) .Marland Kitchen... 1 by mouth once daily .  Orders: Venipuncture (60[address]) TLB-TSH (Thyroid Stimulating Hormone) (84443-TSH)  Complete Medication List: 1)  Prilosec Otc 20 Mg Tbec (Omeprazole magnesium) .... Take 1 tablet daily as   directed to protectstomach lining. 2)  Clarinex 5 Mg Tabs (Desloratadine) .Marland Kitchen.. 1 by mouth once daily prn 3)  Synthroid 125 Mcg Tabs (Levothyroxine sodium) .Marland Kitchen.. 1 by mouth once daily . 4)  Proventil Hfa 108 (90 Base) Mcg/act Aers (Albuterol sulfate) .... 2 puffs qid as needed 5)  Vitamin D 2000 Unit Tabs (Cholecalciferol) .Marland Kitchen.. 1 by mouth once daily 6)  Symbicort 160-4.5 Mcg/act Aero (Budesonide-formoterol fumarate) .Marland Kitchen.. 1-2 puffs q 12 hrs as needed 7)  Gas-x Extra Strength 62.5 Mg Strp (Simethicone) .... As needed 8)  Tylenol 325 Mg Tabs (Acetaminophen) .Marland KitchenMarland KitchenMarland Kitchen  As needed 9)  Mucinex 600 Mg Xr12h-tab (Guaifenesin) .Marland Kitchen.. 1 by mouth once daily 10)  Vitamin B-12 250 Mcg Tabs (Cyanocobalamin) .Marland Kitchen.. 1 by mouth once daily 11)  Multivitamins Tabs (Multiple vitamin) .Marland Kitchen.. 1 by mouth once daily 12)  Cefuroxime Axetil 500 Mg Tabs (Cefuroxime axetil) .Marland Kitchen.. 1 two times a day  Patient Instructions: 1)  Neti pot once daily as needed . 2)  Drink as much fluid as you can tolerate for the next few  days. Prescriptions: SYNTHROID 125 MCG TABS (LEVOTHYROXINE SODIUM) 1 by mouth once daily .  #90 x 1   Entered and Authorized by:   Marga Melnick MD   Signed by:   Marga Melnick MD on 01/14/2010   Method used:   Print then Give to Patient   RxID:   [id number] CEFUROXIME AXETIL 500 MG TABS (CEFUROXIME AXETIL) 1 two times a day  #20 x 0   Entered and Authorized by:   Marga Melnick MD   Signed by:   Marga Melnick MD on 01/14/2010   Method used:   Faxed to ...       [facility]* (retail)       [address]       [place], [place]  [address]       Ph: [phone]       Fax: [phone]   RxID:   [id number]

## 2010-12-29 NOTE — Letter (Signed)
Summary: Colonoscopy Letter  [facility]  [address] [place], [place] [address]   Phone: [phone]  Fax: [phone]      June 09, 2010 MRN: [id number]   [facility] [address] [place], [place]  [address]   Dear Ms. Guandique,   According to your medical record, it is time for you to schedule a Colonoscopy. The American Cancer Society recommends this procedure as a method to detect early colon cancer. Patients with a family history of colon cancer, or a personal history of colon polyps or inflammatory bowel disease are at increased risk.  This letter has been generated based on the recommendations made at the time of your procedure. If you feel that in your particular situation this may no longer apply, please contact our office.  Please call our office at [phone] to schedule this appointment or to update your records at your earliest convenience.  Thank you for cooperating with [place] to provide you with the very best care possible.   Sincerely,  Wilhemina Bonito. Marina Goodell, M.D.  [facility]enterology [facility] [phone]

## 2010-12-29 NOTE — Letter (Signed)
Summary: Encounter Notice/[facility]  Encounter [facility]   Imported By: Lanelle Bal 08/19/2010 10:47:09  _____________________________________________________________________  External Attachment:    Type:   Image     Comment:   External Document

## 2010-12-29 NOTE — Progress Notes (Signed)
Summary: Swollen Vocal Cords  Phone Note Call from Patient Call back at Home Phone [phone]   Caller: Patient Call For: Loreen Freud DO Summary of Call: Patient has completed her antibiotics but her vocal cords are still swollen.  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  What can she do about her swollen vocal cords?  Initial call taken by: Barnie Mort,  February 16, 2010 3:08 PM  Follow-up for Phone Call        we will refer to ent Follow-up by: Loreen Freud DO,  February 16, 2010 3:22 PM  Additional Follow-up for Phone Call Additional follow up Details #1::        Pt is aware. Army Fossa CMA  February 16, 2010 3:44 PM   New Problems: PHARYNGITIS, CHRONIC (ICD-472.1)   New Problems: PHARYNGITIS, CHRONIC (ICD-472.1)

## 2010-12-29 NOTE — Letter (Signed)
Summary: [facility]  [facility]   Imported By: Lanelle Bal 04/07/2010 07:50:35  _____________________________________________________________________  External Attachment:    Type:   Image     Comment:   External Document

## 2010-12-29 NOTE — Letter (Signed)
Summary: [facility]  [facility]   Imported By: Lanelle Bal 03/11/2010 09:11:54  _____________________________________________________________________  External Attachment:    Type:   Image     Comment:   External Document

## 2010-12-31 NOTE — Progress Notes (Signed)
Summary: med too strong  Phone Note Call from Patient Call back at [facility] Phone [phone]   Summary of Call: Pt left VM that she is currently taking CELEXA 20 MG TABS 1 1/2  by mouth once daily. Pt thinks that this is too much and would like to decrease back to 1 tab daily instead. Pt c/o loss of energy/fatigue, and increase in sleeping while on current regimen. Pls advise..............Marland KitchenFelecia Deloach CMA  December 17, 2010 8:58 AM   Follow-up for Phone Call        ok to decrease to 1 tab Follow-up by: Loreen Freud DO,  December 17, 2010 9:28 AM  Additional Follow-up for Phone Call Additional follow up Details #1::        spoke with patient and made her aware of the above...Marland KitchenMarland KitchenMarland Kitchen Almeta Monas CMA Duncan Dull)  December 17, 2010 9:41 AM     New/Updated Medications: CELEXA 20 MG TABS (CITALOPRAM HYDROBROMIDE) 1 by mouth once daily

## 2010-12-31 NOTE — Progress Notes (Signed)
Summary: Wants treatment for Diarrhea  Phone Note Call from Patient   Caller: Patient Call For: Loreen Freud DO Complaint: Breathing Problems Details for Reason: Wants Dr.Lowne to treat Diarrhea Summary of Call: Rcv'd mss from patient and she stated she had called Dr.Amber Freida Busman and her nurse said the patient needs to f/u with Dr.Lowne on Diarrhea and Dr.Lowne can go ahead and treat the patient for the Diarrhea.  Dr.Allen is the sureon who did the gall bladder surery. Pt c/b# is [phone]. Please advise.   Initial call taken by: Almeta Monas CMA Duncan Dull),  November 11, 2010 1:53 PM  Follow-up for Phone Call        cholestyramine 4 g by mouth two times a day  # 1 month2 refills Follow-up by: Loreen Freud DO,  November 11, 2010 2:04 PM    New/Updated Medications: CHOLESTYRAMINE 4 GM/DOSE POWD (CHOLESTYRAMINE) by mouth two times a day Prescriptions: CHOLESTYRAMINE 4 GM/DOSE POWD (CHOLESTYRAMINE) by mouth two times a day  #1 mo x 2   Entered by:   Almeta Monas CMA (AAMA)   Authorized by:   Loreen Freud DO   Signed by:   Almeta Monas CMA (AAMA) on 11/11/2010   Method used:   Faxed to ...       [facility]* (retail)       [address]       [place], [place]  [address]       Ph: [phone]       Fax: [phone]   RxID:   [id number]   Appended Document: Wants treatment for Diarrhea pt aware.Marland KitchenMarland KitchenMarland Kitchen

## 2010-12-31 NOTE — Assessment & Plan Note (Signed)
Summary: rto 1 month/cbs   Vital Signs:  Patient profile:   70 year old female Weight:      167.6 pounds Temp:     98.7 degrees F oral BP sitting:   114 / 60  (right arm) Cuff size:   large  Vitals Entered By: Almeta Monas CMA Duncan Dull) (November 10, 2010 1:13 PM) CC: 1 mo f/u on meds--- still having diarrhea   History of Present Illness: Pt here f/u anxiety.  Pt is better but still c/o anxiety.  Pt would like to increase dose of meds.    Current Medications (verified): 1)  Prilosec Otc 20 Mg Tbec (Omeprazole Magnesium) .... Take 2 Tablets Daily As   Directed To Protectstomach Lining. 2)  Clarinex 5 Mg  Tabs (Desloratadine) .Marland Kitchen.. 1 By Mouth Once Daily Prn 3)  Synthroid 125 Mcg Tabs (Levothyroxine Sodium) .Marland Kitchen.. 1 By Mouth Once Daily . 4)  Proventil Hfa 108 (90 Base) Mcg/act  Aers (Albuterol Sulfate) .... 2 Puffs Qid As Needed 5)  Vitamin D 1000 Unit Tabs (Cholecalciferol) .Marland Kitchen.. 1 By Mouth Once Daily 6)  Symbicort 160-4.5 Mcg/act Aero (Budesonide-Formoterol Fumarate) .Marland Kitchen.. 1-2 Puffs Q 12 Hrs As Needed 7)  Gas-X Extra Strength 62.5 Mg Strp (Simethicone) .... As Needed 8)  Tylenol 325 Mg Tabs (Acetaminophen) .... As Needed 9)  Mucinex 600 Mg Xr12h-Tab (Guaifenesin) .Marland Kitchen.. 1 By Mouth Once Daily 10)  Vitamin B-12 250 Mcg Tabs (Cyanocobalamin) .Marland Kitchen.. 1 By Mouth Once Daily 11)  Multivitamins  Tabs (Multiple Vitamin) .Marland Kitchen.. 1 By Mouth Once Daily 12)  Hyoscyamine Sulfate 0.125 Mg Subl (Hyoscyamine Sulfate) .Marland Kitchen.. 1 Under [place] Every 6 Hrs As Needed For Cramping 13)  Celexa 20 Mg Tabs (Citalopram Hydrobromide) .Marland Kitchen.. 1 1/2  By Mouth Once Daily 14)  Alprazolam 0.25 Mg Tabs (Alprazolam) .Marland Kitchen.. 1 By Mouth Three Times A Day As Needed  Allergies (verified): 1)  ! * Seconal 2)  ! Codeine 3)  ! * Aqueous Penicillin 4)  ! * Singulair 5)  ! * Actos 6)  ! * Avandia 7)  ! 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) TONSILLECTOMY AND ADENOIDECTOMY, HX OF (ICD-V45.79) LIVER FUNCTION TESTS, ABNORMAL, HX OF (ICD-V12.2) HYPERTENSION (ICD-401.9) HYPERLIPIDEMIA (ICD-272.4) DIABETES MELLITUS, TYPE II (ICD-250.00) ASTHMA (ICD-493.90) ANEMIA-IRON DEFICIENCY (ICD-280.9)  Past Surgical History: Last updated: 09/02/2007 Hysterectomy Appendectomy Gastric bypass Percutaneous transluminal coronary angioplasty  Risk Factors: Smoking Status: quit (09/02/2007)  Review of Systems      See HPI  Physical Exam  General:  Well-developed,well-nourished,in no acute distress; alert,appropriate and cooperative throughout examination Psych:  Oriented X3 and normally interactive.     Impression & Recommendations:  Problem # 1:  DIARRHEA, CHRONIC (ICD-787.91) use hycosamine call surgeon ----may try cholestyramine  Discussed symptom control and diet. Call if worsening of symptoms or signs of dehydration.   Problem # 2:  ANXIETY STATE, UNSPECIFIED (ICD-300.00)  Her updated medication list for this problem includes:    Celexa 20 Mg Tabs (Citalopram hydrobromide) .Marland Kitchen... 1 1/2  by mouth once daily    Alprazolam 0.25 Mg Tabs (Alprazolam) .Marland Kitchen... 1 by mouth three times a day as needed  Discussed medication use and relaxation techniques.   Complete Medication  List: 1)  Prilosec Otc 20 Mg Tbec (Omeprazole magnesium) .... Take 2 tablets daily as   directed to protectstomach lining. 2)  Clarinex 5 Mg Tabs (Desloratadine) .Marland Kitchen.. 1 by mouth once daily prn 3)  Synthroid 125 Mcg Tabs (Levothyroxine sodium) .Marland Kitchen.. 1 by mouth once daily . 4)   Proventil Hfa 108 (90 Base) Mcg/act Aers (Albuterol sulfate) .... 2 puffs qid as needed 5)  Vitamin D 1000 Unit Tabs (cholecalciferol)  .Marland Kitchen.. 1 by mouth once daily 6)  Symbicort 160-4.5 Mcg/act Aero (Budesonide-formoterol fumarate) .Marland Kitchen.. 1-2 puffs q 12 hrs as needed 7)  Gas-x Extra Strength 62.5 Mg Strp (Simethicone) .... As needed 8)  Tylenol 325 Mg Tabs (Acetaminophen) .... As needed 9)  Mucinex 600 Mg Xr12h-tab (Guaifenesin) .Marland Kitchen.. 1 by mouth once daily 10)  Vitamin B-12 250 Mcg Tabs (Cyanocobalamin) .Marland Kitchen.. 1 by mouth once daily 11)  Multivitamins Tabs (Multiple vitamin) .Marland Kitchen.. 1 by mouth once daily 12)  Hyoscyamine Sulfate 0.125 Mg Subl (Hyoscyamine sulfate) .Marland Kitchen.. 1 under tonge every 6 hrs as needed for cramping 13)  Celexa 20 Mg Tabs (Citalopram hydrobromide) .Marland Kitchen.. 1 1/2  by mouth once daily 14)  Alprazolam 0.25 Mg Tabs (Alprazolam) .Marland Kitchen.. 1 by mouth three times a day as needed  Patient Instructions: 1)  f/u surgeon about diarrhea 2)  increase celexa 1 1/2 tabs daily 3)  Please schedule a follow-up appointment in 3 months .  Prescriptions: CELEXA 20 MG TABS (CITALOPRAM HYDROBROMIDE) 1 1/2  by mouth once daily  #45 x 3   Entered and Authorized by:   Loreen Freud DO   Signed by:   Loreen Freud DO on 11/10/2010   Method used:   Electronically to        [facility]* (retail)       [address]       [place], [place]  [address]       Ph: [phone]       Fax: [phone]   RxID:   [id number]    Orders Added: 1)  Est. Patient Level III [[address]]

## 2011-01-18 ENCOUNTER — Ambulatory Visit (INDEPENDENT_AMBULATORY_CARE_PROVIDER_SITE_OTHER): Payer: Medicare Other | Admitting: Internal Medicine

## 2011-01-18 ENCOUNTER — Encounter: Payer: Self-pay | Admitting: Internal Medicine

## 2011-01-18 DIAGNOSIS — R1084 Generalized abdominal pain: Secondary | ICD-10-CM | POA: Insufficient documentation

## 2011-01-18 DIAGNOSIS — Z860100 Personal history of colon polyps, unspecified: Secondary | ICD-10-CM | POA: Insufficient documentation

## 2011-01-18 DIAGNOSIS — R197 Diarrhea, unspecified: Secondary | ICD-10-CM

## 2011-01-21 ENCOUNTER — Other Ambulatory Visit: Payer: Self-pay | Admitting: Internal Medicine

## 2011-01-21 ENCOUNTER — Other Ambulatory Visit (AMBULATORY_SURGERY_CENTER): Payer: Medicare Other | Admitting: Internal Medicine

## 2011-01-21 DIAGNOSIS — D126 Benign neoplasm of colon, unspecified: Secondary | ICD-10-CM

## 2011-01-21 DIAGNOSIS — K5730 Diverticulosis of large intestine without perforation or abscess without bleeding: Secondary | ICD-10-CM

## 2011-01-21 DIAGNOSIS — R197 Diarrhea, unspecified: Secondary | ICD-10-CM

## 2011-01-21 DIAGNOSIS — Z86010 Personal history of colonic polyps: Secondary | ICD-10-CM

## 2011-01-25 ENCOUNTER — Encounter: Payer: Self-pay | Admitting: Internal Medicine

## 2011-01-26 ENCOUNTER — Encounter: Payer: Self-pay | Admitting: Internal Medicine

## 2011-01-26 NOTE — Procedures (Addendum)
Summary: Colonoscopy  Patient: Stacy Ayers Note: All result statuses are Final unless otherwise noted.  Tests: (1) Colonoscopy (COL)   COL Colonoscopy           DONE     [facility]     [address]     [place], [place]  [address]           COLONOSCOPY PROCEDURE REPORT           PATIENT:  Stacy Ayers, Stacy Ayers  MR#:  [id number]     BIRTHDATE:  1941/07/27, 69 yrs. old  GENDER:  female     ENDOSCOPIST:  Wilhemina Bonito. Eda Keys, MD     REF. BY:  Surveillance Program Recall,     PROCEDURE DATE:  01/21/2011     PROCEDURE:  Colonoscopy with biopsies,     Colonoscopy with snare polypectomy     x 3     ASA CLASS:  Class II     INDICATIONS:  history of pre-cancerous (adenomatous) colon polyps,     surveillance and high-risk screening, unexplained diarrhea ; index     05-2005 w/ TA     MEDICATIONS:   Fentanyl 100 mcg IV, Versed 10 mg IV           DESCRIPTION OF PROCEDURE:   After the risks benefits and     alternatives of the procedure were thoroughly explained, informed     consent was obtained.  Digital rectal exam was performed and     revealed no abnormalities.   The LB 180AL [id number] endoscope was     introduced through the anus and advanced to the cecum, which was     identified by both the appendix and ileocecal valve, without     limitations.Time to cecum = 5:05 min.  The quality of the prep was     excellent, using MoviPrep.  The instrument was then slowly     withdrawn (time = 15:53 min) as the colon was fully examined.     <<PROCEDUREIMAGES>>           FINDINGS:  The terminal ileum appeared normal.  A 5mm sessile     polyp was found in the ascending colon. Polyp was snared without     cautery. Retrieval was successful.  Two 2mm polyps were found in     the proximal transverse colon. Polyps were snared without cautery.     Retrieval was successful.  Mild diverticulosis was found found     scattered throught the colon. The colonic mucosa was normal.     Random colon bx taken to  evaluated diarrhea.  Retroflexed views in     the rectum revealed no abnormalities.    The scope was then     withdrawn from the patient and the procedure completed.           COMPLICATIONS:  None     ENDOSCOPIC IMPRESSION:     1) Normal terminal ileum     2) Sessile polyp in the ascending colon - removed     3) Two polyps in the proximal transverse colon - removed     4) Mild diverticulosis found scattered throught the colon     5) Diarrhea           RECOMMENDATIONS:     1) Follow up colonoscopy in 5 years     2) Follow up biopsies     3) finish previously prescribed medication     4) Call  to make a follow up office visit with Dr. Marina Goodell in 4-6     weeks     ______________________________     Wilhemina Bonito. Eda Keys, MD           CC:  Lelon Perla, DO; The Patient           n.     eSIGNED:   Aleila Syverson N. Eda Keys at 01/21/2011 09:24 AM           Derrick Ravel, [id number]  Note: An exclamation mark (!) indicates a result that was not dispersed into the flowsheet. Document Creation Date: 01/21/2011 9:24 AM _______________________________________________________________________  (1) Order result status: Final Collection or observation date-time: 01/21/2011 09:14 Requested date-time:  Receipt date-time:  Reported date-time:  Referring Physician:   Ordering Physician: Fransico Setters [phone]) Specimen Source:  Source: Launa Grill Order Number: [id number] Lab site:   Appended Document: Colonoscopy recall 5 yrs     Procedures Next Due Date:    Colonoscopy: 12/2015

## 2011-01-26 NOTE — Assessment & Plan Note (Signed)
Summary: CHRONIC DIARRHEA.  Medications Added IMODIUM A-D 2 MG TABS (LOPERAMIDE HCL) take 1 tablet by mouth once daily FERROUS SULFATE 325 (65 FE) MG TBEC (FERROUS SULFATE) take 1 tablet by mouth once daily MOVIPREP 100 GM  SOLR (PEG-KCL-NACL-NASULF-NA ASC-C) As per prep instructions. 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 the patient was last seen in July of 2006 for screening colonoscopy. she was found to have mild sigmoid diverticulosis as well as a small polyp which was removed and found to be an adenoma. followup in 5 years recommended. recall letter previously sent. she presents today regarding problems with diarrhea. The patient tells me that her problems began postcholecystectomy approximately 18 months ago. She describes between zero and 6 bowel movements per day. There is cramping and urgency prior to defecation. Stools are mostly soft or loose. There is a postprandial component. No nocturnal component. She has been treated with probiotic  and  Levsin without relief. she does take 2 Imodium per day. she is most concerned with incontinent episodes. She has had 3 such episodes. Chills or reports 12 pound weight loss. Some mucus. No bleeding.. Review of outside records from November 2011 revealed an unremarkable  CBC, comprehensive metabolic panel, TSH, and pro time. also negative stool studies for C. difficile and enteric pathogens   GI Review of Systems    Reports abdominal pain,  bloating, and  nausea.     Location of  Abdominal pain: lower abdomen.    Denies acid reflux, belching, chest pain, dysphagia with liquids, dysphagia with solids, heartburn, loss of appetite, vomiting, vomiting blood, weight loss, and  weight gain.      Reports diarrhea and  fecal incontinence.     Denies anal fissure, black tarry stools, change in bowel habit, constipation, diverticulosis, heme positive stool, hemorrhoids, irritable bowel syndrome, jaundice, light color stool, liver problems, rectal bleeding, and  rectal pain. Preventive Screening-Counseling & Management  Alcohol-Tobacco     Smoking Status: quit      Drug Use:  no.      Current Medications (verified): 1)  Prilosec Otc 20 Mg Tbec (Omeprazole Magnesium) .... Take 2 Tablets Daily As   Directed To Protectstomach Lining. 2)  Clarinex 5 Mg  Tabs (Desloratadine) .Marland Kitchen.. 1 By Mouth Once Daily Prn 3)  Synthroid 125 Mcg Tabs (Levothyroxine Sodium) .Marland Kitchen.. 1 By Mouth Once Daily . 4)  Proventil Hfa 108 (90 Base) Mcg/act  Aers (Albuterol Sulfate) .... 2 Puffs Qid As Needed 5)  Vitamin D 1000 Unit Tabs (Cholecalciferol) .Marland Kitchen.. 1 By Mouth Once Daily 6)  Symbicort 160-4.5 Mcg/act Aero (Budesonide-Formoterol Fumarate) .Marland Kitchen.. 1-2 Puffs Q 12 Hrs As Needed 7)  Gas-X Extra Strength 62.5 Mg Strp (Simethicone) .... As Needed 8)  Tylenol 325 Mg Tabs (Acetaminophen) .... As Needed 9)  Mucinex 600 Mg Xr12h-Tab (Guaifenesin) .Marland Kitchen.. 1 By Mouth Once Daily 10)  Vitamin B-12 250 Mcg Tabs (Cyanocobalamin) .Marland Kitchen.. 1 By Mouth Once Daily 11)  Multivitamins  Tabs (Multiple Vitamin) .Marland Kitchen.. 1 By Mouth Once Daily 12)  Hyoscyamine Sulfate  0.125 Mg Subl (Hyoscyamine Sulfate) .Marland Kitchen.. 1 Under [place] Every 6 Hrs As Needed For Cramping 13)  Celexa 20 Mg Tabs (Citalopram Hydrobromide) .Marland Kitchen.. 1 By Mouth Once Daily 14)  Alprazolam 0.25 Mg Tabs (Alprazolam) .Marland Kitchen.. 1 By Mouth Three Times A Day As Needed 15)  Cholestyramine 4 Gm/dose Powd (Cholestyramine) .... By Mouth Two Times A Day 16)   Imodium A-D 2 Mg Tabs (Loperamide Hcl) .... Take 1 Tablet By Mouth Once Daily 17)  Ferrous Sulfate 325 (65 Fe) Mg Tbec (Ferrous Sulfate) .... Take 1 Tablet By Mouth Once Daily  Allergies (verified): 1)  ! * Seconal 2)  ! Codeine 3)  ! * Aqueous Penicillin 4)  ! * Singulair 5)  ! * Actos 6)  ! * Avandia 7)  ! 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  Alcohol Use - yes Daily Caffeine Use Illicit Drug Use - no Drug Use:  no  Review of Systems       The patient complains of allergy/sinus, anxiety-new, arthritis/joint pain, back pain, fatigue, and headaches-new.  The patient denies anemia, blood in urine, breast changes/lumps, change in vision, confusion, cough, coughing up blood, depression-new, fainting, fever, hearing problems, heart murmur, heart rhythm changes, itching, menstrual pain, muscle pains/cramps, night sweats, nosebleeds, pregnancy symptoms, shortness of breath, skin rash, sleeping problems, sore  throat, swelling of feet/legs, swollen lymph glands, thirst - excessive , urination - excessive , urination changes/pain, urine leakage, vision changes, and voice change.    Vital Signs:  Patient profile:   70 year old female Height:      68 inches Weight:      168 pounds BMI:     25.64 Pulse rate:   80 / minute Pulse rhythm:   regular BP sitting:   116 / 68  (left arm) Cuff size:   regular  Vitals Entered By: June McMurray CMA Duncan Dull) (January 18, 2011 9:31 AM)  Physical Exam  General:  Well developed, well nourished, no acute distress. Head:  Normocephalic and atraumatic. Eyes:  PERRLA, no icterus. Nose:  No deformity, discharge,  or lesions. Mouth:  No deformity or lesions, Neck:  Supple; no masses or thyromegaly. Lungs:  Clear throughout to auscultation. Heart:  Regular rate and rhythm; no murmurs, rubs,  or bruits. Abdomen:  Soft, nontender and nondistended. No masses, hepatosplenomegaly or hernias noted. Normal bowel sounds. Rectal:  DEFERRED Msk:  Symmetrical with no gross deformities. Normal posture. Pulses:  Normal pulses noted. Extremities:  No clubbing, cyanosis, edema or deformities noted. Neurologic:  Alert and  oriented x4 Skin:  Intact without significant lesions or rashes. Psych:  Alert and cooperative. Normal mood and affect.   Impression & Recommendations:  Problem # 1:  DIARRHEA, CHRONIC (ICD-787.91)  chronic problems with cramping, urgency, and loose stools. seems to be temporally related to  cholecystectomy. possible bile salt induced diarrhea. has irritable bowel features. Other possibilities include bacterial overgrowth post gastric bypass surgery , microscopic colitis , or irritable bowel.   Plan : #1. Empiric trial of metronidazole 250 mg 4 times a day x10 days  #2. Schedule colonoscopy with biopsies  #3. Office followup thereafter.  Problem # 2:  PERSONAL HISTORY OF COLONIC POLYPS (ICD-V12.72)  history of adenomatous colon polyps. due for  followup.   Plan : #1. colonoscopy. The nature of the procedure as well as the risks, benefits, and alternatives were reviewed. she understood and agreed to proceed  #2. movie prep prescribed. The patient instructed on  its use  Other Orders: Colonoscopy (Colon)  Patient Instructions: 1)  colonoscopy [facility] 01/21/11 8:30 am arrive at 7:30 am on 4 th floor 2)  Movi prep instructions given 3)  Movi prep prescription sent to pharmacy and Metronidazole 250 mg  sent to pharmacy for you to pick up. 4)  Colonoscopy and Flexible Sigmoidoscopy brochure given.  5)  Copy sent to : Loreen Freud DO 6)  The medication list was reviewed and reconciled.  All changed / newly prescribed medications were explained.  A complete medication list was provided to the patient / caregiver. Prescriptions: METRONIDAZOLE 250 MG TABS (METRONIDAZOLE) take 1 by mouth four times daily  #40 x 0   Entered by:   Milford Cage NCMA   Authorized by:   Hilarie Fredrickson MD   Signed by:   Milford Cage NCMA on 01/18/2011   Method used:   Electronically to        [facility]* (retail)       [address]       [place], [place]  [id number]       Ph: [phone]       Fax: [phone]   RxID:   [id number] MOVIPREP 100 GM  SOLR (PEG-KCL-NACL-NASULF-NA ASC-C) As per prep instructions.  #1 x 0   Entered by:   Milford Cage NCMA   Authorized by:   Hilarie Fredrickson MD   Signed by:   Milford Cage NCMA on 01/18/2011   Method used:   Electronically to        [facility]* (retail)       [address]       [place], [place]  [id number]       Ph: [phone]       Fax: [phone]   RxID:   [id number]

## 2011-01-26 NOTE — Letter (Signed)
Summary: [facility] Instructions  [facility]  [address] [place], [place] [address]   Phone: [phone]  Fax: [phone]       JAUNICE Ayers    68-30-1942    MRN: [id number]        Procedure Day /Date:THURSDAY 01/21/11     Arrival Time:7:30 AM     Procedure Time:8:30 AM     Location of Procedure:                    X  [facility] (4th Floor)  PREPARATION FOR COLONOSCOPY WITH MOVIPREP    STARTING TODAY do not eat nuts, seeds, popcorn, corn, beans, peas,  salads, or any raw vegetables.  Do not take any fiber supplements (e.g. Metamucil, Citrucel, and Benefiber).  THE DAY BEFORE YOUR PROCEDURE         DATE:01/20/11 DAY: WEDNESDAY  1.  Drink clear liquids the entire day-NO SOLID FOOD  2.  Do not drink anything colored red or purple.  Avoid juices with pulp.  No orange juice.  3.  Drink at least 64 oz. (8 glasses) of fluid/clear liquids during the day to prevent dehydration and help the prep work efficiently.  CLEAR LIQUIDS INCLUDE: Water Jello Ice Popsicles Tea (sugar ok, no milk/cream) Powdered fruit flavored drinks Coffee (sugar ok, no milk/cream) Gatorade Juice: apple, white grape, white cranberry  Lemonade Clear bullion, consomm, broth Carbonated beverages (any kind) Strained chicken noodle soup Hard Candy                             4.  In the morning, mix first dose of MoviPrep solution:    Empty 1 Pouch A and 1 Pouch B into the disposable container    Add lukewarm drinking water to the top line of the container. Mix to dissolve    Refrigerate (mixed solution should be used within 24 hrs)  5.  Begin drinking the prep at 5:00 p.m. The MoviPrep container is divided by 4 marks.   Every 15 minutes drink the solution down to the next mark (approximately 8 oz) until the full liter is complete.   6.  Follow completed prep with 16 oz of clear liquid of your choice (Nothing red or purple).  Continue to drink clear liquids until  bedtime.  7.  Before going to bed, mix second dose of MoviPrep solution:    Empty 1 Pouch A and 1 Pouch B into the disposable container    Add lukewarm drinking water to the top line of the container. Mix to dissolve    Refrigerate  THE DAY OF YOUR PROCEDURE      DATE: 01/21/11 DAY: THURSDAY_  Beginning at 3:30 a.m. (5 hours before procedure):         1. Every 15 minutes, drink the solution down to the next mark (approx 8 oz) until the full liter is complete.  2. Follow completed prep with 16 oz. of clear liquid of your choice.    3. You may drink clear liquids until 6:30 AM (2 HOURS BEFORE PROCEDURE).   MEDICATION INSTRUCTIONS  Unless otherwise instructed, you should take regular prescription medications with a small sip of water   as early as possible the morning of your procedure.         OTHER INSTRUCTIONS  You will need a responsible adult at least 68 years of age to accompany you and drive you home.  This person must remain in the waiting room during your procedure.  Wear loose fitting clothing that is easily removed.  Leave jewelry and other valuables at home.  However, you may wish to bring a book to read or  an iPod/MP3 player to listen to music as you wait for your procedure to start.  Remove all body piercing jewelry and leave at home.  Total time from sign-in until discharge is approximately 2-3 hours.  You should go home directly after your procedure and rest.  You can resume normal activities the  day after your procedure.  The day of your procedure you should not:   Drive   Make legal decisions   Operate machinery   Drink alcohol   Return to work  You will receive specific instructions about eating, activities and medications before you leave.    The above instructions have been reviewed and explained to me by   _______________________    I fully understand and can verbalize these instructions _____________________________ Date  _________

## 2011-02-04 NOTE — Letter (Signed)
Summary: Appt Reminder 2  [facility]  [address]   [place], [place] [address]   Phone: [phone]  Fax: [phone]        January 25, 2011 MRN: [id number]    [facility] [address] [place], [place]  [address]    Dear [name],   You have a return appointment with Dr. Marina Goodell on 03/04/11 at 8:30am.  Please remember to bring a complete list of the medicines you are taking, your insurance card and your co-pay.  If you have to cancel or reschedule this appointment, please call before 5:00 pm the evening before to avoid a cancellation fee.  If you have any questions or concerns, please call [phone].    Sincerely,    Selinda Michaels RN  Appended Document: Appt Reminder 2 .letter  Appended Document: Appt Reminder 2 Letter is mailed to the patient's home address

## 2011-02-04 NOTE — Letter (Signed)
Summary: Patient Notice- Polyp Results  [facility]  [address] [place], [place] [address]   Phone: [phone]  Fax: [phone]        January 26, 2011 MRN: [id number]    [facility] [address] [place], [place]  [address]    Dear Stacy Ayers,  I am pleased to inform you that the colon polyps removed during your recent colonoscopy were found to be benign (no cancer detected) upon pathologic examination.  Also, the random colon biopsies (to evaluate diarrhea) were normal.  I recommend you have a repeat colonoscopy examination in 5 years to look for recurrent polyps, as having colon polyps increases your risk for having recurrent polyps or even colon cancer in the future.  Should you develop new or worsening symptoms of abdominal pain, bowel habit changes or bleeding from the rectum or bowels, please schedule an evaluation with either your primary care physician or with me.  Additional information/recommendations:  __  Please follow-up with your primary care physician for your other      healthcare needs.  __ Please keep your follow-up visit as as requested.  __ Continue treatment plan as outlined the day of your exam.    Please call us if you are having persistent problems or have questions about your condition that have not been fully answered at this time.  Sincerely,  Hilarie Fredrickson MD  This letter has been electronically signed by your physician.  Appended Document: Patient Notice- Polyp Results letter mailed

## 2011-02-11 LAB — APTT: aPTT: 36 seconds (ref 24–37)

## 2011-02-11 LAB — CK TOTAL AND CKMB (NOT AT ARMC)
CK, MB: 1.6 ng/mL (ref 0.3–4.0)
Total CK: 37 U/L (ref 7–177)

## 2011-02-11 LAB — BASIC METABOLIC PANEL
Chloride: 106 mEq/L (ref 96–112)
GFR calc Af Amer: >60 mL/min (ref >60)
Potassium: 4.2 mEq/L (ref 3.5–5.1)

## 2011-02-11 LAB — COMPREHENSIVE METABOLIC PANEL
ALT: 21 U/L (ref 0–35)
Albumin: 3.5 g/dL (ref 3.5–5.2)
Albumin: 3.9 g/dL (ref 3.5–5.2)
Alkaline Phosphatase: 75 U/L (ref 39–117)
BUN: 11 mg/dL (ref 6–23)
Calcium: 9.1 mg/dL (ref 8.4–10.5)
Chloride: 105 mEq/L (ref 96–112)
Creatinine, Ser: 0.57 mg/dL (ref 0.4–1.2)
Glucose, Bld: 107 mg/dL — ABNORMAL HIGH (ref 70–99)
Potassium: 4.2 mEq/L (ref 3.5–5.1)
Sodium: 138 mEq/L (ref 135–145)
Total Bilirubin: 0.6 mg/dL (ref 0.3–1.2)
Total Protein: 5.9 g/dL — ABNORMAL LOW (ref 6.0–8.3)
Total Protein: 6.5 g/dL (ref 6.0–8.3)

## 2011-02-11 LAB — LIPID PANEL
HDL: 34 mg/dL — ABNORMAL LOW (ref >39)
Total CHOL/HDL Ratio: 3.9 RATIO
Triglycerides: 112 mg/dL (ref <150)

## 2011-02-11 LAB — CBC
HCT: 36.3 % (ref 36.0–46.0)
Hemoglobin: 12.6 g/dL (ref 12.0–15.0)
MCH: 30.8 pg (ref 26.0–34.0)
MCV: 89.4 fL (ref 78.0–100.0)
MCV: 89.6 fL (ref 78.0–100.0)
Platelets: 156 10*3/uL (ref 150–400)
RBC: 4.06 MIL/uL (ref 3.87–5.11)
RDW: 13.5 % (ref 11.5–15.5)
WBC: 6.1 10*3/uL (ref 4.0–10.5)

## 2011-02-11 LAB — CARDIAC PANEL(CRET KIN+CKTOT+MB+TROPI)
Relative Index: INVALID (ref 0.0–2.5)
Total CK: 35 U/L (ref 7–177)
Troponin I: 0.02 ng/mL (ref 0.00–0.06)

## 2011-02-11 LAB — DIFFERENTIAL
Basophils Absolute: 0 10*3/uL (ref 0.0–0.1)
Basophils Absolute: 0 10*3/uL (ref 0.0–0.1)
Basophils Relative: 1 % (ref 0–1)
Eosinophils Relative: 2 % (ref 0–5)
Lymphocytes Relative: 45 % (ref 12–46)
Lymphs Abs: 2 10*3/uL (ref 0.7–4.0)
Monocytes Absolute: 0.3 10*3/uL (ref 0.1–1.0)
Monocytes Absolute: 0.4 10*3/uL (ref 0.1–1.0)
Monocytes Relative: 6 % (ref 3–12)
Monocytes Relative: 7 % (ref 3–12)
Neutro Abs: 2 10*3/uL (ref 1.7–7.7)
Neutro Abs: 3.6 10*3/uL (ref 1.7–7.7)

## 2011-02-11 LAB — TROPONIN I: Troponin I: 0.01 ng/mL (ref 0.00–0.06)

## 2011-02-11 LAB — D-DIMER, QUANTITATIVE: D-Dimer, Quant: 0.22 ug/mL-FEU (ref 0.00–0.48)

## 2011-02-15 ENCOUNTER — Telehealth: Payer: Self-pay | Admitting: Family Medicine

## 2011-02-25 ENCOUNTER — Encounter: Payer: Self-pay | Admitting: *Deleted

## 2011-02-25 NOTE — Progress Notes (Signed)
Summary: reaction to med  Phone Note Refill Request Call back at [facility] Phone [phone] Message from:  Patient  Refills Requested: Medication #1:  CELEXA 20 MG TABS 1 by mouth once daily Pt states that med is causing her to be very tired and sleepy. Pt would like to decrease med or possible change to something else. Pt use [facility]. Pls advise.Marti Sleigh Deloach CMA  February 15, 2011 11:54 AM    Follow-up for Phone Call        decrease celexa to 1/2 daily for 1 week ----start prozac 20 mg 1 by mouth once daily #30  2 refills-----ov 4-6 weeks or sooner prn Follow-up by: Loreen Freud DO,  February 15, 2011 12:01 PM  Additional Follow-up for Phone Call Additional follow up Details #1::        Pt aware of the above and she voiced understanding.... Almeta Monas CMA Duncan Dull)  February 15, 2011 5:15 PM     New/Updated Medications: PROZAC 20 MG CAPS (FLUOXETINE HCL) 1 by mouth once daily Prescriptions: PROZAC 20 MG CAPS (FLUOXETINE HCL) 1 by mouth once daily  #30 x 2   Entered by:   Almeta Monas CMA (AAMA)   Authorized by:   Loreen Freud DO   Signed by:   Almeta Monas CMA (AAMA) on 02/15/2011   Method used:   Faxed to ...       [facility]* (retail)       [address]       [place], [place]  [id number]       Ph: [phone]       Fax: [phone]   RxID:   [id number]

## 2011-03-01 ENCOUNTER — Telehealth: Payer: Self-pay

## 2011-03-01 NOTE — Telephone Encounter (Signed)
Rcv'd letter from patient advising she is going on a mission trip, per Dr.Lowne she may need her 3rd Hep B, ? Hep A and Boosterix. She also documented that patient may need to call the health department and find out what vaccines are required for her trip out of the country.     KP

## 2011-03-03 ENCOUNTER — Encounter: Payer: Self-pay | Admitting: Internal Medicine

## 2011-03-03 ENCOUNTER — Ambulatory Visit (INDEPENDENT_AMBULATORY_CARE_PROVIDER_SITE_OTHER): Payer: Medicare Other | Admitting: Internal Medicine

## 2011-03-03 VITALS — BP 110/56 | HR 72 | Ht 68.0 in | Wt 170.6 lb

## 2011-03-03 DIAGNOSIS — R197 Diarrhea, unspecified: Secondary | ICD-10-CM

## 2011-03-03 DIAGNOSIS — Z860100 Personal history of colon polyps, unspecified: Secondary | ICD-10-CM

## 2011-03-03 MED ORDER — DIPHENOXYLATE-ATROPINE 2.5-0.025 MG PO TABS: 1.0000 | ORAL_TABLET | Freq: Three times a day (TID) | ORAL | Status: DC | PRN

## 2011-03-03 MED ORDER — DIPHENOXYLATE-ATROPINE 2.5-0.025 MG PO TABS: 1.0000 | ORAL_TABLET | Freq: Three times a day (TID) | ORAL | Status: AC | PRN

## 2011-03-03 NOTE — Patient Instructions (Signed)
Please take prescription of Lomotil to your pharmacy and take as directed. Follow-up as needed with Dr. Marina Goodell.

## 2011-03-03 NOTE — Progress Notes (Signed)
HISTORY OF PRESENT ILLNESS:  Stacy Ayers is a 70 y.o. 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 Patient was most recently evaluated in this office 01/26/2011 regarding a 6 month history of chronic diarrhea. See that dictation. She subsequently underwent colonoscopy. Diminutive polyps, both adenomatous and non-adenomatous were removed. Mild diverticulosis present. Normal terminal ileum. Normal colonic mucosa with normal biopsies. Followup in 5 years recommended. She was empirically treated with metronidazole 250 mg 4 times a day x10 days. She presents now for followup. Patient reports that she may have had some modest improvement in symptoms on metronidazole. She continues to take Imodium 2 or 3 daily. This helps. However, still with urgency and loose stools, generally postprandially. Reports about 3 bowel movements per day. As previously noted, one month of Questran did not help. No new complaints.  REVIEW OF SYSTEMS:  Non-GI review of systems remarkable for allergy and sinuses, anxiety, arthritis, back pain, cough, depression, fatigue, headaches, muscle pains, insomnia, and urinary leakage. All other systems negative  Past Medical History  Diagnosis Date  . Iron deficiency anemia   . Asthma   . Diabetes mellitus   . Hyperlipidemia   . History of colon polyps     adenomatous  . Hypothyroidism   . Abnormal liver function tests   . Hypertension   . GERD (gastroesophageal reflux disease)     Past Surgical History  Procedure Date  . Abdominal hysterectomy   . Appendectomy   . Gastric bypass   . Coronary angioplasty   . Cholecystectomy   . Tonsillectomy and adenoidectomy   . Bladder repair     Social History Stacy Ayers  reports that she has quit smoking. Her smoking use included Cigarettes. She has never used smokeless tobacco.  She reports that she drinks alcohol. She reports that she does not use illicit drugs.  family history includes Diabetes in her sister; Heart disease in her maternal grandmother; and Stroke in her mother.  Allergies  Allergen Reactions  . Codeine   . Doxycycline     REACTION: GI UPSET  . Montelukast Sodium   . Penicillins   . Pioglitazone   . Rosiglitazone Maleate   . Secobarbital Sodium   . Sulfonamide Derivatives        PHYSICAL EXAMINATION:   Vital signs: BP 110/56  Pulse 72  Ht 5\' 8"  (1.727 m)  Wt 170 lb 9.6 oz (77.384 kg)  BMI 25.94 kg/m2 General: Well-developed, well-nourished, no acute distress HEENT: Sclerae are anicteric, conjunctiva pink. Oral mucosa intact Lungs: Clear Heart: Regular Abdomen: soft, nontender, nondistended, no obvious ascites, no peritoneal signs, normal bowel sounds. No organomegaly. Extremities: No edema Psychiatric: alert and oriented x3. Cooperative     ASSESSMENT AND PLAN: #1). Diarrhea. Sounds like irritable bowel. Modest improvement with metronidazole. No alarm features. Negative colonoscopy.  Plan: #1. Prescribed Lomotil one or 2 by mouth 3 times a day when necessary. Titrate to need #2. GI followup when necessary  #2). Adenomatous polyps. Due for followup in 5 years

## 2011-03-04 ENCOUNTER — Ambulatory Visit: Payer: Medicare Other | Admitting: Internal Medicine

## 2011-03-04 NOTE — Telephone Encounter (Signed)
2nd time.Marland KitchenMarland KitchenMarland KitchenLeft message to call back     KP

## 2011-03-11 NOTE — Telephone Encounter (Signed)
Mssg left to call the office.---3rd attempt to contact patient, will mail letter at the end of day if patient doesn't return the call.... KP

## 2011-03-15 NOTE — Telephone Encounter (Signed)
Letter mailed to contact the office.     KP 

## 2011-04-16 NOTE — Cardiovascular Report (Signed)
NAME:  Stacy Ayers, Stacy Ayers                           ACCOUNT NO.:  [id number]   MEDICAL RECORD NO.:  [id number]                   PATIENT TYPE:  OIB   LOCATION:  6501                                 FACILITY:  [facility]   PHYSICIAN:  Carole Binning, M.D. [facility]         DATE OF BIRTH:  02-16-1941   DATE OF PROCEDURE:  05/05/2004  DATE OF DISCHARGE:  05/05/2004                              CARDIAC CATHETERIZATION   PROCEDURE PERFORMED:  Right and left heart catheterization with coronary  angiography and left ventriculography.   INDICATION:  [name] is a 70 year old woman with multiple cardiac risk  factors including diabetes, hypertension and hyperlipidemia.  She has had  progressive exertional dyspnea occurring with daily activity as well as  episodes of neck pain.  Because of the progressive nature of her dyspnea and  her multiple cardiac risk factors, we opted to proceed with cardiac  catheterization to assess her hemodynamics and rule out coronary artery  disease.   PROCEDURAL NOTE:  A 7 French sheath was placed in the right femoral vein, 4  French sheath in the right femoral artery.  Right heart catheterization was  performed with a Swan-Ganz catheter.  Left ventriculography was performed  with an angled pigtail catheter.  Coronary angiography was performed with  standard Judkins 4 French catheters.  Contrast was Omnipaque.  There were no  complications.   CATHETERIZATION RESULTS:   HEMODYNAMICS:  1. Right atrial pressure A 10, V 6, mean 6.  2. Right ventricular 27/7.  3. Pulmonary artery 24/12.  4. Pulmonary capillary wedge pressure A 10, V 8, mean 7.  5. Left ventricular pressure 122/12.  6. Aortic pressure 122/65.  7. There is no significant gradient measured across the mitral or aortic     valves.  8. Cardiac output by the thermodilution method is 6.1, cardiac index 2.8.  9. Cardiac output by the Fick method is 5.8, cardiac index 2.7.   LEFT VENTRICULOGRAM:  Wall motion is  normal.  Ejection fraction estimated at  greater than or equal to 60%.  There was no mitral regurgitation.   CORONARY ARTERIOGRAPHY (RIGHT DOMINANT):  Left main is normal.   Left anterior descending artery has minor luminal irregularities in the mid  vessel.  This LAD is otherwise normal giving rise to a large single diagonal  branch.   Left circumflex gives rise to a small ramus intermedius and ends as a large  obtuse marginal branch.  The circumflex is normal.   Right coronary artery is a large dominant vessel.  The distal right coronary  artery gives rise to a large bifurcating posterior descending artery and two  small posterior lateral branches.  The right coronary artery is normal.   IMPRESSION:  1. Normal right and left heart filling pressures with normal pulmonary     artery pressures.  2. Normal left ventricular systolic function.  3. No significant coronary artery disease identified.   In  summary, this is essentially normal catheterization.  The patient's  symptoms appear to be noncardiac in etiology.                                               Carole Binning, M.D. [facility]    MWP/MEDQ  D:  05/05/2004  T:  05/06/2004  Job:  [id number]   cc:   Titus Dubin. Alwyn Ren, M.D. [facility]

## 2011-05-22 ENCOUNTER — Other Ambulatory Visit: Payer: Self-pay | Admitting: Family Medicine

## 2011-06-28 ENCOUNTER — Other Ambulatory Visit: Payer: Self-pay | Admitting: Family Medicine

## 2011-07-12 ENCOUNTER — Encounter: Payer: Self-pay | Admitting: Family Medicine

## 2011-07-12 ENCOUNTER — Ambulatory Visit (INDEPENDENT_AMBULATORY_CARE_PROVIDER_SITE_OTHER): Payer: Medicare Other | Admitting: Family Medicine

## 2011-07-12 VITALS — BP 120/80 | HR 72 | Ht 67.5 in | Wt 173.4 lb

## 2011-07-12 DIAGNOSIS — I998 Other disorder of circulatory system: Secondary | ICD-10-CM

## 2011-07-12 DIAGNOSIS — R58 Hemorrhage, not elsewhere classified: Secondary | ICD-10-CM

## 2011-07-12 DIAGNOSIS — E039 Hypothyroidism, unspecified: Secondary | ICD-10-CM

## 2011-07-12 DIAGNOSIS — Z0000 Encounter for general adult medical examination without abnormal findings: Secondary | ICD-10-CM

## 2011-07-12 DIAGNOSIS — Z136 Encounter for screening for cardiovascular disorders: Secondary | ICD-10-CM

## 2011-07-12 DIAGNOSIS — E785 Hyperlipidemia, unspecified: Secondary | ICD-10-CM

## 2011-07-12 DIAGNOSIS — K219 Gastro-esophageal reflux disease without esophagitis: Secondary | ICD-10-CM

## 2011-07-12 DIAGNOSIS — Z23 Encounter for immunization: Secondary | ICD-10-CM

## 2011-07-12 LAB — CBC WITH DIFFERENTIAL/PLATELET
Basophils Absolute: 0 10*3/uL (ref 0.0–0.1)
Lymphocytes Relative: 30.7 % (ref 12.0–46.0)
Monocytes Relative: 5.9 % (ref 3.0–12.0)
Neutrophils Relative %: 60.5 % (ref 43.0–77.0)
Platelets: 167 10*3/uL (ref 150.0–400.0)
RDW: 13.2 % (ref 11.5–14.6)

## 2011-07-12 LAB — LIPID PANEL
Cholesterol: 189 mg/dL (ref 0–200)
HDL: 57.2 mg/dL (ref >39.00)
LDL Cholesterol: 107 mg/dL — ABNORMAL HIGH (ref 0–99)
Total CHOL/HDL Ratio: 3
Triglycerides: 122 mg/dL (ref 0.0–149.0)
VLDL: 24.4 mg/dL (ref 0.0–40.0)

## 2011-07-12 LAB — PROTIME-INR
INR: 1 ratio (ref 0.8–1.0)
Prothrombin Time: 11.6 s (ref 10.2–12.4)

## 2011-07-12 LAB — POCT URINALYSIS DIPSTICK
Protein, UA: NEGATIVE
Spec Grav, UA: 1.01
Urobilinogen, UA: 0.2

## 2011-07-12 LAB — HEPATIC FUNCTION PANEL
AST: 20 U/L (ref 0–37)
Alkaline Phosphatase: 62 U/L (ref 39–117)
Bilirubin, Direct: 0.1 mg/dL (ref 0.0–0.3)
Total Bilirubin: 0.9 mg/dL (ref 0.3–1.2)

## 2011-07-12 LAB — BASIC METABOLIC PANEL
Calcium: 9 mg/dL (ref 8.4–10.5)
GFR: 101.7 mL/min (ref >60.00)
Glucose, Bld: 95 mg/dL (ref 70–99)
Sodium: 136 mEq/L (ref 135–145)

## 2011-07-12 LAB — APTT: aPTT: 26 s (ref 21.7–28.8)

## 2011-07-12 LAB — TSH: TSH: 3.49 u[IU]/mL (ref 0.35–5.50)

## 2011-07-12 MED ORDER — OMEPRAZOLE 40 MG PO CPDR: 40.0000 mg | DELAYED_RELEASE_CAPSULE | Freq: Two times a day (BID) | ORAL | Status: DC

## 2011-07-12 MED ORDER — ZOSTER VACCINE LIVE 19400 UNT/0.65ML ~~LOC~~ SOLR: 0.6500 mL | Freq: Once | SUBCUTANEOUS | Status: DC

## 2011-07-12 NOTE — Patient Instructions (Signed)

## 2011-07-12 NOTE — Progress Notes (Signed)
Subjective:     Stacy Ayers is a 70 y.o. female and is here for a comprehensive physical exam. The patient reports no problems.  History   Social History  . Marital Status: Divorced    Spouse Name: N/A    Number of Children: 1  . Years of Education: N/A   Occupational History  . retired Runner, broadcasting/film/video   .     Social History Main Topics  . Smoking status: Former Smoker -- 0.3 packs/day for 2 years    Types: Cigarettes    Quit date: 07/11/1962  . Smokeless tobacco: Never Used  . Alcohol Use: 8.4 oz/week    14 Glasses of wine per week  . Drug Use: No  . Sexually Active: Not Currently   Other Topics Concern  . Not on file   Social History Narrative   Daily caffeine   Health Maintenance  Topic Date Due  . Zostavax  05/28/2003  . Influenza Vaccine  08/30/2011  . Mammogram  07/11/2012  . Tetanus/tdap  07/11/2013  . Colonoscopy  07/11/2020  . Pneumococcal Polysaccharide Vaccine Age 55 And Over  Completed    The following portions of the patient's history were reviewed and updated as appropriate: allergies, current medications, past family history, past medical history, past social history, past surgical history and problem list.  Review of Systems  Review of Systems  Constitutional: Negative for activity change, appetite change and fatigue.  HENT: Negative for hearing loss, congestion, tinnitus and ear discharge.  dentist due Eyes: Negative for visual disturbance (see optho q1y -- vision corrected with contacts ).  Respiratory: Negative for cough, chest tightness and shortness of breath.   Cardiovascular: Negative for chest pain, palpitations and leg swelling.  Gastrointestinal: Negative for abdominal pain, diarrhea, constipation and abdominal distention.  Genitourinary: Negative for urgency, frequency, decreased urine volume and difficulty urinating.  Musculoskeletal: Negative for back pain, arthralgias and gait problem.  Skin: Negative for color change, pallor and rash.    Neurological: Negative for dizziness, light-headedness, numbness and headaches.  Hematological: Negative for adenopathy. Does not bruise/bleed easily.  Psychiatric/Behavioral: Negative for suicidal ideas, confusion, sleep disturbance, self-injury, dysphoric mood, decreased concentration and agitation.  Pt is able to read and write and can do all ADLs No risk for falling No abuse/ violence in home     Objective:    BP 120/80  Pulse 72  Ht 5' 7.5" (1.715 m)  Wt 173 lb 6.4 oz (78.654 kg)  BMI 26.76 kg/m2 General appearance: alert, cooperative, appears stated age and no distress Head: Normocephalic, without obvious abnormality, atraumatic Eyes: conjunctivae/corneas clear. PERRL, EOM's intact. Fundi benign. Ears: normal TM's and external ear canals both ears Nose: Nares normal. Septum midline. Mucosa normal. No drainage or sinus tenderness. Throat: lips, mucosa, and tongue normal; teeth and gums normal Neck: no adenopathy, no carotid bruit, no JVD, supple, symmetrical, trachea midline and thyroid not enlarged, symmetric, no tenderness/mass/nodules Back: symmetric, no curvature. ROM normal. No CVA tenderness. Lungs: clear to auscultation bilaterally Breasts: normal appearance, no masses or tenderness Heart: regular rate and rhythm, S1, S2 normal, no murmur, click, rub or gallop Abdomen: soft, non-tender; bowel sounds normal; no masses,  no organomegaly Pelvic: gyn Extremities: extremities normal, atraumatic, no cyanosis or edema Pulses: 2+ and symmetric Skin: Skin color, texture, turgor normal. No rashes or lesions Lymph nodes: Cervical, supraclavicular, and axillary nodes normal. Neurologic: Alert and oriented X 3, normal strength and tone. Normal symmetric reflexes. Normal coordination and gait psych--no depression, anxiety  Assessment:    Healthy female exam.  Hypothyroidism Hyperlipidemia GERD--- refill omeprazole --if symptoms do not subside after restarting meds---f/u  GI   Plan:  check fasting labs  ghm utd con't current meds See After Visit Summary for Counseling Recommendations

## 2011-07-13 ENCOUNTER — Encounter: Payer: Self-pay | Admitting: *Deleted

## 2011-07-30 ENCOUNTER — Other Ambulatory Visit: Payer: Self-pay | Admitting: Family Medicine

## 2011-07-30 MED ORDER — FLUOXETINE HCL 20 MG PO CAPS: 20.0000 mg | ORAL_CAPSULE | Freq: Every day | ORAL | Status: DC

## 2011-07-30 NOTE — Telephone Encounter (Signed)
done

## 2011-08-03 ENCOUNTER — Encounter: Payer: Self-pay | Admitting: Internal Medicine

## 2011-08-03 ENCOUNTER — Ambulatory Visit (INDEPENDENT_AMBULATORY_CARE_PROVIDER_SITE_OTHER): Payer: Medicare Other | Admitting: Internal Medicine

## 2011-08-03 VITALS — BP 118/68 | HR 82 | Ht 67.0 in | Wt 175.0 lb

## 2011-08-03 DIAGNOSIS — Z86010 Personal history of colonic polyps: Secondary | ICD-10-CM

## 2011-08-03 DIAGNOSIS — K589 Irritable bowel syndrome without diarrhea: Secondary | ICD-10-CM

## 2011-08-03 DIAGNOSIS — R197 Diarrhea, unspecified: Secondary | ICD-10-CM

## 2011-08-03 NOTE — Patient Instructions (Signed)
Follow up as needed

## 2011-08-03 NOTE — Progress Notes (Signed)
HISTORY OF PRESENT ILLNESS:  Stacy Ayers is a 70 y.o. 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 She presents today for followup regarding chronic problems with diarrhea. She has had that problem for at least 1 year. Was initially evaluated in February 2012. She underwent complete colonoscopy with ileal intubation. Random colon biopsies were normal. Adenomatous non-adenomatous polyps removed. She was empirically treated with metronidazole for 10 days with transient modest improvement. She had been using Imodium when necessary. Questran did not help. She was last seen in April 2012 for followup. Lomotil prescribed. She did not feel this helped. Because of ongoing problems, she presents today. No bleeding or weight loss. She describes on average 3 bowel movements per day. 5-8 bowel movements on a "bad day". Bowel movements are generally the morning and occur 20 minutes after a meal. There is significant urgency. Occasional symptoms in the afternoon. She takes Imodium 4-5 times per week, generally 2 per day no more than 3. She did notice that her symptoms improved after discontinuing the artificial sweetener, splenda. She does not take Levsin. She does take magnesium tablets daily for cramps. She denies having a day without a bowel movement and denies constipation. She has had some incontinent episodes. Review of outside blood work from August 2012 finds a normal CBC, comprehensive metabolic panel, and thyroid stimulating hormone.  REVIEW OF SYSTEMS:  All non-GI ROS negative.  Past Medical History  Diagnosis Date  . Iron deficiency anemia   . Asthma   . Diabetes mellitus   . Hyperlipidemia   . History of colon polyps     adenomatous  . Hypothyroidism   . Abnormal liver function tests   . Hypertension   . GERD (gastroesophageal reflux disease)       Past Surgical History  Procedure Date  . Abdominal hysterectomy   . Appendectomy   . Gastric bypass   . Coronary angioplasty   . Cholecystectomy   . Tonsillectomy and adenoidectomy   . Bladder repair   . Abdominal hysterectomy     Social History Stacy Ayers  reports that she quit smoking about 49 years ago. Her smoking use included Cigarettes. She has a .6 pack-year smoking history. She has never used smokeless tobacco. She reports that she drinks about 8.4 ounces of alcohol per week. She reports that she does not use illicit drugs.  family history includes Diabetes in her sister; Heart disease in her maternal grandmother; and Stroke in her mother.  Allergies  Allergen Reactions  . Codeine   . Doxycycline     REACTION: GI UPSET  . Montelukast Sodium   . Penicillins   . Pioglitazone   . Rosiglitazone Maleate   . Secobarbital Sodium   . Sulfonamide Derivatives        PHYSICAL EXAMINATION:  Vital signs: BP 118/68  Pulse 82  Ht 5\' 7"  (1.702 m)  Wt 175 lb (79.379 kg)  BMI 27.41 kg/m2 General: Well-developed, well-nourished, no acute distress HEENT: Sclerae are anicteric, conjunctiva pink. Oral mucosa intact Lungs: Clear Heart: Regular Abdomen: soft, nontender, nondistended, no obvious ascites, no peritoneal signs, normal bowel sounds. No organomegaly. Extremities: No edema Psychiatric: alert and oriented x3. Cooperative    ASSESSMENT:  #1. Diarrhea predominant irritable bowel syndrome #2. History of adenomatous colon polyps #3. Status post Roux-en-Y gastric bypass 2007   PLAN:  #1. Stop magnesium as this can exacerbate diarrhea #2.  Use Imodium more regularly. Recommend to the morning and 2 in the afternoon to start. Hold for constipation #3. Surveillance colonoscopy due around 2017 #4. GI followup as needed

## 2011-08-05 ENCOUNTER — Encounter: Payer: Self-pay | Admitting: Internal Medicine

## 2011-09-01 LAB — COMPREHENSIVE METABOLIC PANEL
ALT: 17
AST: 22
CO2: 28
Chloride: 106
Creatinine, Ser: 0.62
GFR calc Af Amer: >60
GFR calc non Af Amer: >60
Glucose, Bld: 90
Total Bilirubin: 0.7

## 2011-09-01 LAB — URINALYSIS, ROUTINE W REFLEX MICROSCOPIC
Bilirubin Urine: NEGATIVE
Glucose, UA: NEGATIVE
Hgb urine dipstick: NEGATIVE
Protein, ur: NEGATIVE

## 2011-09-01 LAB — DIFFERENTIAL
Basophils Relative: 1
Lymphocytes Relative: 35
Lymphs Abs: 1.6
Monocytes Absolute: 0.3
Monocytes Relative: 7
Neutro Abs: 2.6

## 2011-09-01 LAB — POCT I-STAT, CHEM 8
BUN: 15
Calcium, Ion: 1.28
HCT: 35 — ABNORMAL LOW
Hemoglobin: 11.9 — ABNORMAL LOW
Sodium: 140
TCO2: 29

## 2011-09-01 LAB — POCT CARDIAC MARKERS: Myoglobin, poc: 34.8

## 2011-09-01 LAB — CBC
Hemoglobin: 12.2
MCHC: 34.4
RBC: 3.93
WBC: 4.6

## 2011-09-01 LAB — APTT: aPTT: 33

## 2011-09-08 ENCOUNTER — Ambulatory Visit (INDEPENDENT_AMBULATORY_CARE_PROVIDER_SITE_OTHER): Payer: Medicare Other

## 2011-09-08 DIAGNOSIS — Z23 Encounter for immunization: Secondary | ICD-10-CM

## 2011-10-27 ENCOUNTER — Other Ambulatory Visit: Payer: Self-pay | Admitting: Family Medicine

## 2011-12-13 ENCOUNTER — Other Ambulatory Visit: Payer: Self-pay | Admitting: Family Medicine

## 2012-02-03 ENCOUNTER — Other Ambulatory Visit: Payer: Self-pay | Admitting: Family Medicine

## 2012-03-02 ENCOUNTER — Other Ambulatory Visit: Payer: Self-pay | Admitting: Family Medicine

## 2012-03-07 ENCOUNTER — Ambulatory Visit (INDEPENDENT_AMBULATORY_CARE_PROVIDER_SITE_OTHER): Payer: Medicare Other | Admitting: Family Medicine

## 2012-03-07 ENCOUNTER — Encounter: Payer: Self-pay | Admitting: Family Medicine

## 2012-03-07 VITALS — BP 116/64 | HR 68 | Temp 98.5°F | Wt 167.2 lb

## 2012-03-07 DIAGNOSIS — R6889 Other general symptoms and signs: Secondary | ICD-10-CM

## 2012-03-07 DIAGNOSIS — R5383 Other fatigue: Secondary | ICD-10-CM

## 2012-03-07 DIAGNOSIS — Z9884 Bariatric surgery status: Secondary | ICD-10-CM

## 2012-03-07 DIAGNOSIS — R52 Pain, unspecified: Secondary | ICD-10-CM

## 2012-03-07 DIAGNOSIS — E039 Hypothyroidism, unspecified: Secondary | ICD-10-CM

## 2012-03-07 LAB — VITAMIN B12: Vitamin B-12: 193 pg/mL — ABNORMAL LOW (ref 211–911)

## 2012-03-07 LAB — CBC WITH DIFFERENTIAL/PLATELET
Basophils Relative: 0.8 % (ref 0.0–3.0)
Eosinophils Relative: 1.6 % (ref 0.0–5.0)
HCT: 37.7 % (ref 36.0–46.0)
Lymphs Abs: 1.2 10*3/uL (ref 0.7–4.0)
MCV: 95.6 fl (ref 78.0–100.0)
Monocytes Absolute: 0.3 10*3/uL (ref 0.1–1.0)
RBC: 3.94 Mil/uL (ref 3.87–5.11)
WBC: 4.3 10*3/uL — ABNORMAL LOW (ref 4.5–10.5)

## 2012-03-07 LAB — HEPATIC FUNCTION PANEL
ALT: 16 U/L (ref 0–35)
Total Protein: 6.8 g/dL (ref 6.0–8.3)

## 2012-03-07 LAB — TSH: TSH: 0.68 u[IU]/mL (ref 0.35–5.50)

## 2012-03-07 LAB — BASIC METABOLIC PANEL
Chloride: 103 mEq/L (ref 96–112)
Potassium: 4.1 mEq/L (ref 3.5–5.1)

## 2012-03-07 NOTE — Patient Instructions (Signed)

## 2012-03-07 NOTE — Progress Notes (Signed)
  Subjective:     Stacy Ayers is a 71 y.o. female who presents for evaluation of fatigue. Symptoms began several months ago. The patient feels the fatigue began with: exercise intolerance and loose stools. Symptoms of her fatigue have been diffuse soft tissue aches and pains. Patient describes the following psychological symptoms: none. Patient denies change in hair texture, cold intolerance, constipation, fever, GI blood loss, symptoms of arthritis, unusual rashes and witnessed or suspected sleep apnea. Symptoms have stabilized. Symptom severity: symptoms bothersome, but easily able to carry out all usual work/school/family activities. Previous visits for this problem: hx throid problems.   The following portions of the patient's history were reviewed and updated as appropriate: allergies, current medications, past family history, past medical history, past social history, past surgical history and problem list.  Review of Systems Pertinent items are noted in HPI.    Objective:    BP 116/64  Pulse 68  Temp(Src) 98.5 F (36.9 C) (Oral)  Wt 167 lb 3.2 oz (75.841 kg)  SpO2 97% General appearance: alert, cooperative, appears stated age and no distress Neck: no adenopathy, no carotid bruit, no JVD, supple, symmetrical, trachea midline and thyroid not enlarged, symmetric, no tenderness/mass/nodules Lungs: clear to auscultation bilaterally Heart: regular rate and rhythm, S1, S2 normal, no murmur, click, rub or gallop Extremities: extremities normal, atraumatic, no cyanosis or edema Skin: ganglion right wrist x2---mole on chest -- ? SCC Lymph nodes: Cervical, supraclavicular, and axillary nodes normal.    Assessment:    Fatigue, organic cause likely.  Differential diagnoses includes: hypothyroidism.   ganglion cyst-- R wrist--pt does not wish to do anything about it at this time Plan:    Discussed diagnosis with patient. See orders for lab evaluation. Follow up in 6 months or as needed.

## 2012-03-14 ENCOUNTER — Ambulatory Visit (INDEPENDENT_AMBULATORY_CARE_PROVIDER_SITE_OTHER): Payer: Medicare Other | Admitting: *Deleted

## 2012-03-14 DIAGNOSIS — E538 Deficiency of other specified B group vitamins: Secondary | ICD-10-CM

## 2012-03-14 MED ORDER — CYANOCOBALAMIN 1000 MCG/ML IJ SOLN
1000.0000 ug | Freq: Once | INTRAMUSCULAR | Status: AC
  Administered 2012-03-14: 1000 ug via INTRAMUSCULAR

## 2012-03-16 ENCOUNTER — Other Ambulatory Visit: Payer: Self-pay | Admitting: Family Medicine

## 2012-03-17 NOTE — Telephone Encounter (Signed)
Refill done.  

## 2012-03-21 ENCOUNTER — Telehealth: Payer: Self-pay | Admitting: *Deleted

## 2012-03-21 ENCOUNTER — Ambulatory Visit (INDEPENDENT_AMBULATORY_CARE_PROVIDER_SITE_OTHER): Payer: Medicare Other

## 2012-03-21 DIAGNOSIS — D518 Other vitamin B12 deficiency anemias: Secondary | ICD-10-CM

## 2012-03-21 MED ORDER — CYANOCOBALAMIN 1000 MCG/ML IJ SOLN
1000.0000 ug | Freq: Once | INTRAMUSCULAR | Status: AC
  Administered 2012-03-21: 1000 ug via INTRAMUSCULAR

## 2012-03-21 NOTE — Telephone Encounter (Signed)
Prior Auth approved 02-25-12 until 03-17-13, approval letter scan to chart, pharmacy notified via fax.

## 2012-03-22 ENCOUNTER — Telehealth: Payer: Self-pay

## 2012-03-22 NOTE — Telephone Encounter (Signed)
PA--Omeprazole approved from 02/28/2012 until 03/21/2013. Case ID # [id number].    Letter sent to be scanned       KP

## 2012-03-28 ENCOUNTER — Ambulatory Visit (INDEPENDENT_AMBULATORY_CARE_PROVIDER_SITE_OTHER): Payer: Medicare Other

## 2012-03-28 DIAGNOSIS — D518 Other vitamin B12 deficiency anemias: Secondary | ICD-10-CM

## 2012-03-28 MED ORDER — CYANOCOBALAMIN 1000 MCG/ML IJ SOLN
1000.0000 ug | Freq: Once | INTRAMUSCULAR | Status: AC
  Administered 2012-03-28: 1000 ug via INTRAMUSCULAR

## 2012-04-04 ENCOUNTER — Ambulatory Visit (INDEPENDENT_AMBULATORY_CARE_PROVIDER_SITE_OTHER): Payer: Medicare Other | Admitting: *Deleted

## 2012-04-04 DIAGNOSIS — E538 Deficiency of other specified B group vitamins: Secondary | ICD-10-CM

## 2012-04-04 MED ORDER — CYANOCOBALAMIN 1000 MCG/ML IJ SOLN
1000.0000 ug | Freq: Once | INTRAMUSCULAR | Status: AC
  Administered 2012-04-04: 1000 ug via INTRAMUSCULAR

## 2012-04-05 ENCOUNTER — Ambulatory Visit (INDEPENDENT_AMBULATORY_CARE_PROVIDER_SITE_OTHER): Payer: Medicare Other | Admitting: Family Medicine

## 2012-04-05 ENCOUNTER — Encounter: Payer: Self-pay | Admitting: Family Medicine

## 2012-04-05 VITALS — BP 114/72 | HR 67 | Temp 98.6°F | Wt 168.6 lb

## 2012-04-05 DIAGNOSIS — F418 Other specified anxiety disorders: Secondary | ICD-10-CM | POA: Insufficient documentation

## 2012-04-05 DIAGNOSIS — F329 Major depressive disorder, single episode, unspecified: Secondary | ICD-10-CM

## 2012-04-05 MED ORDER — FLUOXETINE HCL 10 MG PO CAPS: ORAL_CAPSULE | ORAL | Status: DC

## 2012-04-05 NOTE — Patient Instructions (Signed)

## 2012-04-05 NOTE — Assessment & Plan Note (Signed)
Increase prozac 10 mg  3 po qd  F/u 4-6 weeks

## 2012-04-05 NOTE — Progress Notes (Signed)
  Subjective:    Patient ID: Stacy Ayers, female    DOB: August 22, 1941, 71 y.o.   MRN: [id number]  HPI Pt here c/o feeling a little more depressed and she would like to increase her prozac a little.   No other complaints.   Pt is not suicidal.    Review of Systems    as above Objective:   Physical Exam  Constitutional: She is oriented to person, place, and time. She appears well-developed and well-nourished.  Neurological: She is alert and oriented to person, place, and time.  Psychiatric: She has a normal mood and affect. Her behavior is normal. Judgment and thought content normal.          Assessment & Plan:

## 2012-04-11 ENCOUNTER — Other Ambulatory Visit: Payer: Medicare Other

## 2012-04-13 ENCOUNTER — Other Ambulatory Visit: Payer: Self-pay | Admitting: Family Medicine

## 2012-04-25 ENCOUNTER — Encounter: Payer: Self-pay | Admitting: Family Medicine

## 2012-04-25 ENCOUNTER — Ambulatory Visit (INDEPENDENT_AMBULATORY_CARE_PROVIDER_SITE_OTHER): Payer: Medicare Other | Admitting: Family Medicine

## 2012-04-25 VITALS — BP 122/70 | HR 96 | Temp 98.8°F | Wt 167.2 lb

## 2012-04-25 DIAGNOSIS — R062 Wheezing: Secondary | ICD-10-CM

## 2012-04-25 DIAGNOSIS — J209 Acute bronchitis, unspecified: Secondary | ICD-10-CM

## 2012-04-25 DIAGNOSIS — J329 Chronic sinusitis, unspecified: Secondary | ICD-10-CM

## 2012-04-25 DIAGNOSIS — J40 Bronchitis, not specified as acute or chronic: Secondary | ICD-10-CM

## 2012-04-25 DIAGNOSIS — H109 Unspecified conjunctivitis: Secondary | ICD-10-CM

## 2012-04-25 MED ORDER — MOXIFLOXACIN HCL 400 MG PO TABS: 400.0000 mg | ORAL_TABLET | Freq: Every day | ORAL | Status: AC

## 2012-04-25 MED ORDER — PREDNISONE 10 MG PO TABS: ORAL_TABLET | ORAL | Status: DC

## 2012-04-25 MED ORDER — ALBUTEROL SULFATE HFA 108 (90 BASE) MCG/ACT IN AERS: 2.0000 | INHALATION_SPRAY | Freq: Four times a day (QID) | RESPIRATORY_TRACT | Status: DC | PRN

## 2012-04-25 MED ORDER — ALBUTEROL SULFATE (5 MG/ML) 0.5% IN NEBU
2.5000 mg | INHALATION_SOLUTION | Freq: Once | RESPIRATORY_TRACT | Status: AC
  Administered 2012-04-25: 2.5 mg via RESPIRATORY_TRACT

## 2012-04-25 MED ORDER — BUDESONIDE-FORMOTEROL FUMARATE 160-4.5 MCG/ACT IN AERO: 2.0000 | INHALATION_SPRAY | Freq: Two times a day (BID) | RESPIRATORY_TRACT | Status: DC

## 2012-04-25 MED ORDER — MOXIFLOXACIN HCL 0.5 % OP SOLN: 1.0000 [drp] | Freq: Three times a day (TID) | OPHTHALMIC | Status: AC

## 2012-04-25 MED ORDER — METHYLPREDNISOLONE ACETATE 80 MG/ML IJ SUSP
80.0000 mg | Freq: Once | INTRAMUSCULAR | Status: AC
  Administered 2012-04-25: 80 mg via INTRAMUSCULAR

## 2012-04-25 NOTE — Progress Notes (Signed)
  Subjective:     Stacy Ayers is a 71 y.o. female who presents for evaluation of sinus pain. Symptoms include: congestion, cough, facial pain, headaches, nasal congestion and sinus pressure. Onset of symptoms was 7 days ago. Symptoms have been gradually worsening since that time. Past history is significant for no history of pneumonia or bronchitis. Patient is a non-smoker.  The following portions of the patient's history were reviewed and updated as appropriate: allergies, current medications, past family history, past medical history, past social history, past surgical history and problem list.  Review of Systems Pertinent items are noted in HPI.   Objective:    BP 122/70  Pulse 96  Temp(Src) 98.8 F (37.1 C) (Oral)  Wt 167 lb 3.2 oz (75.841 kg)  SpO2 95% General appearance: alert, cooperative, appears stated age and no distress Eyes: positive findings: + green d/c and eye injected Ears: normal TM's and external ear canals both ears Nose: green discharge, moderate congestion, turbinates red, swollen, sinus tenderness bilateral Throat: lips, mucosa, and tongue normal; teeth and gums normal Neck: mild anterior cervical adenopathy, supple, symmetrical, trachea midline and thyroid not enlarged, symmetric, no tenderness/mass/nodules Lungs: diminished breath sounds bilaterally and wheezes bilaterally Heart: S1, S2 normal    Assessment:    Acute bacterial sinusitis.  bronchitis  Plan:    Nasal steroids per medication orders. Antihistamines per medication orders. avelox per medication orders. depomedrol and pred taper

## 2012-04-25 NOTE — Patient Instructions (Signed)

## 2012-05-05 ENCOUNTER — Ambulatory Visit (INDEPENDENT_AMBULATORY_CARE_PROVIDER_SITE_OTHER): Payer: Medicare Other

## 2012-05-05 ENCOUNTER — Other Ambulatory Visit (INDEPENDENT_AMBULATORY_CARE_PROVIDER_SITE_OTHER): Payer: Medicare Other

## 2012-05-05 DIAGNOSIS — D518 Other vitamin B12 deficiency anemias: Secondary | ICD-10-CM

## 2012-05-05 DIAGNOSIS — D509 Iron deficiency anemia, unspecified: Secondary | ICD-10-CM

## 2012-05-05 LAB — VITAMIN B12: Vitamin B-12: 317 pg/mL (ref 211–911)

## 2012-05-05 MED ORDER — CYANOCOBALAMIN 1000 MCG/ML IJ SOLN
1000.0000 ug | Freq: Once | INTRAMUSCULAR | Status: AC
  Administered 2012-05-05: 1000 ug via INTRAMUSCULAR

## 2012-05-08 ENCOUNTER — Other Ambulatory Visit: Payer: Medicare Other

## 2012-08-07 LAB — HM DEXA SCAN

## 2012-08-14 ENCOUNTER — Other Ambulatory Visit: Payer: Self-pay | Admitting: Family Medicine

## 2012-08-25 ENCOUNTER — Encounter: Payer: Self-pay | Admitting: Family Medicine

## 2012-08-25 ENCOUNTER — Ambulatory Visit (INDEPENDENT_AMBULATORY_CARE_PROVIDER_SITE_OTHER): Payer: Medicare Other | Admitting: Family Medicine

## 2012-08-25 VITALS — BP 110/70 | HR 68 | Temp 98.6°F | Wt 169.2 lb

## 2012-08-25 DIAGNOSIS — R1013 Epigastric pain: Secondary | ICD-10-CM

## 2012-08-25 DIAGNOSIS — Z23 Encounter for immunization: Secondary | ICD-10-CM

## 2012-08-25 DIAGNOSIS — K3189 Other diseases of stomach and duodenum: Secondary | ICD-10-CM

## 2012-08-25 LAB — HEPATIC FUNCTION PANEL
AST: 88 U/L — ABNORMAL HIGH (ref 0–37)
Albumin: 4.1 g/dL (ref 3.5–5.2)
Alkaline Phosphatase: 171 U/L — ABNORMAL HIGH (ref 39–117)
Bilirubin, Direct: 0.1 mg/dL (ref 0.0–0.3)
Total Bilirubin: 0.7 mg/dL (ref 0.3–1.2)

## 2012-08-25 LAB — CBC WITH DIFFERENTIAL/PLATELET
Basophils Absolute: 0 10*3/uL (ref 0.0–0.1)
Hemoglobin: 12.3 g/dL (ref 12.0–15.0)
Lymphocytes Relative: 35.2 % (ref 12.0–46.0)
Monocytes Relative: 6 % (ref 3.0–12.0)
Neutro Abs: 2.7 10*3/uL (ref 1.4–7.7)
Neutrophils Relative %: 55.7 % (ref 43.0–77.0)
RDW: 13.1 % (ref 11.5–14.6)

## 2012-08-25 LAB — BASIC METABOLIC PANEL
Calcium: 9.2 mg/dL (ref 8.4–10.5)
GFR: 94.31 mL/min (ref >60.00)
Glucose, Bld: 102 mg/dL — ABNORMAL HIGH (ref 70–99)
Sodium: 137 mEq/L (ref 135–145)

## 2012-08-25 MED ORDER — GI COCKTAIL ~~LOC~~
30.0000 mL | Freq: Once | ORAL | Status: AC
  Administered 2012-08-25: 30 mL via ORAL

## 2012-08-25 MED ORDER — PANTOPRAZOLE SODIUM 40 MG PO TBEC: 40.0000 mg | DELAYED_RELEASE_TABLET | Freq: Every day | ORAL | Status: DC

## 2012-08-25 NOTE — Patient Instructions (Signed)
Diet for GERD or PUD Nutrition therapy can help ease the discomfort of gastroesophageal reflux disease (GERD) and peptic ulcer disease (PUD).  HOME CARE INSTRUCTIONS   Eat your meals slowly, in a relaxed setting.   Eat 5 to 6 small meals per day.   If a food causes distress, stop eating it for a period of time.  FOODS TO AVOID  Coffee, regular or decaffeinated.   Cola beverages, regular or low calorie.   Tea, regular or decaffeinated.   Pepper.   Cocoa.   High fat foods, including meats.   Butter, margarine, hydrogenated oil (trans fats).   Peppermint or spearmint (if you have GERD).   Fruits and vegetables if not tolerated.   Alcohol.   Nicotine (smoking or chewing). This is one of the most potent stimulants to acid production in the gastrointestinal tract.   Any food that seems to aggravate your condition.  If you have questions regarding your diet, ask your caregiver or a registered dietitian. TIPS  Lying flat may make symptoms worse. Keep the head of your bed raised 6 to 9 inches (15 to 23 cm) by using a foam wedge or blocks under the legs of the bed.   Do not lay down until 3 hours after eating a meal.   Daily physical activity may help reduce symptoms.  MAKE SURE YOU:   Understand these instructions.   Will watch your condition.   Will get help right away if you are not doing well or get worse.  Document Released: 11/15/2005 Document Revised: 11/04/2011 Document Reviewed: 10/01/2011 [employer] Patient Information 2012 [employer]. 

## 2012-08-26 ENCOUNTER — Encounter: Payer: Self-pay | Admitting: Family Medicine

## 2012-08-26 NOTE — Progress Notes (Signed)
  Subjective:     Stacy Ayers is an 71 y.o. female who presents for evaluation of heartburn. This has been associated with midepigastric pain and pressure at base of neck.  . She denies n/v, weight loss. . Symptoms have been present for several weeks. She c/o dysphagia. She denies weight loss.. She denies bleeding.   Medical therapy in the past has included: omeprazole. Past Medical History  Diagnosis Date  . Iron deficiency anemia   . Asthma   . Diabetes mellitus   . Hyperlipidemia   . History of colon polyps     adenomatous  . Hypothyroidism   . Abnormal liver function tests   . Hypertension   . GERD (gastroesophageal reflux disease)    Current Outpatient Prescriptions on File Prior to Visit  Medication Sig Dispense Refill  . albuterol (PROVENTIL HFA) 108 (90 BASE) MCG/ACT inhaler Inhale 2 puffs into the lungs every 6 (six) hours as needed.  1 Inhaler  2  . budesonide-formoterol (SYMBICORT) 160-4.5 MCG/ACT inhaler Inhale 2 puffs into the lungs 2 (two) times daily.  1 Inhaler  5  . cholecalciferol (VITAMIN D) 1000 UNITS tablet Take 1,000 Units by mouth daily.        Marland Kitchen desloratadine (CLARINEX) 5 MG tablet Take 5 mg by mouth daily.        Marland Kitchen FLUoxetine (PROZAC) 10 MG capsule 3 po qd  30 capsule  11  . loperamide (IMODIUM) 2 MG capsule Take 2 mg by mouth as needed. daily      . PRILOSEC 40 MG capsule TAKE (1) CAPSULE TWICE DAILY.  60 capsule  2  . Probiotic Product (ACIDOPHILUS PROBIOTIC BLEND PO) Take by mouth.        . Simethicone (GAS-X EXTRA STRENGTH) 62.5 MG STRP Take by mouth. As needed       . SYNTHROID 125 MCG tablet TAKE 1 TABLET EACH DAY.  90 each  3  . zoster vaccine live, PF, (ZOSTAVAX) 16109 UNT/0.65ML injection Inject 19,400 Units into the skin once.  1 vial  0  . ferrous sulfate 325 (65 FE) MG tablet Take 325 mg by mouth daily with breakfast.        . guaiFENesin (MUCINEX) 600 MG 12 hr tablet Take 600 mg by mouth as needed. daily      . hyoscyamine (LEVSIN SL) 0.125 MG SL  tablet Place 0.125 mg under the tongue every 6 (six) hours as needed.        . Magnesium 250 MG TABS Take by mouth.        . pantoprazole (PROTONIX) 40 MG tablet Take 1 tablet (40 mg total) by mouth daily.  30 tablet  3  . predniSONE (DELTASONE) 10 MG tablet 3 po qd for 3 days then 2 po qd for 3 days the 1 po qd for 3 days  18 tablet  0     Review of Systems As above  Objective:     BP 110/70  Pulse 68  Temp 98.6 F (37 C) (Oral)  Wt 169 lb 3.2 oz (76.749 kg)  SpO2 98% /gen--  AAOx3 nad Cor--S1S2 normal Lungs---CTAB/L   Abd--soft ,  + midepigastric tenderness   Assessment:    Gastroesophageal Reflux Disease, worsening    Plan:    check labs Refer GI protonix  See AVS

## 2012-08-31 ENCOUNTER — Telehealth: Payer: Self-pay

## 2012-08-31 NOTE — Telephone Encounter (Signed)
Advised pt of results. Scheduled recheck LFT and referred to [facility] to schedule [place] abd. Pt stated understanding.      MW

## 2012-09-04 ENCOUNTER — Other Ambulatory Visit: Payer: Medicare Other

## 2012-09-05 ENCOUNTER — Ambulatory Visit (INDEPENDENT_AMBULATORY_CARE_PROVIDER_SITE_OTHER)
Admission: RE | Admit: 2012-09-05 | Discharge: 2012-09-05 | Disposition: A | Discharge status: Home / Self Care | Payer: BC Managed Care – PPO | Source: Ambulatory Visit | Attending: Family Medicine | Admitting: Family Medicine

## 2012-09-05 ENCOUNTER — Ambulatory Visit (INDEPENDENT_AMBULATORY_CARE_PROVIDER_SITE_OTHER): Payer: Medicare Other | Admitting: Family Medicine

## 2012-09-05 ENCOUNTER — Encounter: Payer: Self-pay | Admitting: Family Medicine

## 2012-09-05 VITALS — BP 120/68 | HR 66 | Temp 98.9°F | Wt 169.2 lb

## 2012-09-05 DIAGNOSIS — M79609 Pain in unspecified limb: Secondary | ICD-10-CM

## 2012-09-05 DIAGNOSIS — W19XXXA Unspecified fall, initial encounter: Secondary | ICD-10-CM

## 2012-09-05 DIAGNOSIS — D509 Iron deficiency anemia, unspecified: Secondary | ICD-10-CM

## 2012-09-05 DIAGNOSIS — M25559 Pain in unspecified hip: Secondary | ICD-10-CM

## 2012-09-05 DIAGNOSIS — M79672 Pain in left foot: Secondary | ICD-10-CM

## 2012-09-05 DIAGNOSIS — D518 Other vitamin B12 deficiency anemias: Secondary | ICD-10-CM

## 2012-09-05 LAB — VITAMIN B12: Vitamin B-12: 231 pg/mL (ref 211–911)

## 2012-09-05 LAB — HEPATIC FUNCTION PANEL
ALT: 42 U/L — ABNORMAL HIGH (ref 0–35)
Bilirubin, Direct: 0.1 mg/dL (ref 0.0–0.3)
Total Bilirubin: 0.8 mg/dL (ref 0.3–1.2)

## 2012-09-05 NOTE — Progress Notes (Signed)
  Subjective:    Patient ID: Stacy Ayers, female    DOB: 1941-01-31, 71 y.o.   MRN: [id number]  HPI Pt here c/o fall on Saturday night.  She was trying to save a stray dog and he pulled her down and she landed on her left hip.  Pain in L hip and L foot, the toes.  Pt states she has fallen 3 x in the last month.  Prior to Saturday she tripped on a curb and on steps.   She is concerned about inc falls even thought there are good reasons for them.    Pt leaving tomorrow until 24th ---to [place]     Review of Systems As above    Objective:   Physical Exam  Constitutional: She is oriented to person, place, and time. She appears well-developed and well-nourished.  Musculoskeletal:       Legs: Neurological: She is alert and oriented to person, place, and time.   Filed Vitals:   09/05/12 1012  BP: 120/68  Pulse: 66  Temp: 98.9 F (37.2 C)  TempSrc: Oral  Weight: 169 lb 3.2 oz (76.749 kg)  SpO2: 98%         Assessment & Plan:

## 2012-09-05 NOTE — Patient Instructions (Addendum)
Fall Prevention and Home Safety  Falls cause injuries and can affect all age groups. It is possible to prevent falls.    HOW TO PREVENT FALLS   Wear shoes with rubber soles that do not have an opening for your toes.   Keep the inside and outside of your house well lit.   Use night lights throughout your home.   Remove clutter from floors.   Clean up floor spills.   Remove throw rugs or fasten them to the floor with carpet tape.   Do not place electrical cords across pathways.   Put grab bars by your tub, shower, and toilet. Do not use towel bars as grab bars.   Put handrails on both sides of the stairway. Fix loose handrails.   Do not climb on stools or stepladders, if possible.   Do not wax your floors.   Repair uneven or unsafe sidewalks, walkways, or stairs.   Keep items you use a lot within reach.   Be aware of pets.   Keep emergency numbers next to the telephone.   Put smoke detectors in your home and near bedrooms.  Ask your doctor what other things you can do to prevent falls.  Document Released: 09/11/2009 Document Revised: 05/16/2012 Document Reviewed: 02/15/2012  ExitCare Patient Information 2013 [employer].

## 2012-09-06 DIAGNOSIS — M79672 Pain in left foot: Secondary | ICD-10-CM | POA: Insufficient documentation

## 2012-09-06 NOTE — Assessment & Plan Note (Signed)
Check xrays Ace, boot given Rest, elevation and ice

## 2012-09-15 ENCOUNTER — Ambulatory Visit: Payer: Medicare Other | Admitting: Internal Medicine

## 2012-09-22 ENCOUNTER — Ambulatory Visit: Payer: Medicare Other | Admitting: Internal Medicine

## 2012-09-26 ENCOUNTER — Ambulatory Visit (INDEPENDENT_AMBULATORY_CARE_PROVIDER_SITE_OTHER): Payer: Medicare Other

## 2012-09-26 DIAGNOSIS — E538 Deficiency of other specified B group vitamins: Secondary | ICD-10-CM

## 2012-09-26 MED ORDER — CYANOCOBALAMIN 1000 MCG/ML IJ SOLN
1000.0000 ug | Freq: Once | INTRAMUSCULAR | Status: AC
  Administered 2012-09-26: 1000 ug via INTRAMUSCULAR

## 2012-10-03 ENCOUNTER — Ambulatory Visit (INDEPENDENT_AMBULATORY_CARE_PROVIDER_SITE_OTHER): Payer: Medicare Other

## 2012-10-03 DIAGNOSIS — E538 Deficiency of other specified B group vitamins: Secondary | ICD-10-CM

## 2012-10-03 MED ORDER — CYANOCOBALAMIN 1000 MCG/ML IJ SOLN
1000.0000 ug | Freq: Once | INTRAMUSCULAR | Status: AC
  Administered 2012-10-03: 1000 ug via INTRAMUSCULAR

## 2012-10-06 ENCOUNTER — Ambulatory Visit: Payer: BC Managed Care – PPO

## 2012-10-06 ENCOUNTER — Encounter: Payer: Self-pay | Admitting: Internal Medicine

## 2012-10-06 ENCOUNTER — Ambulatory Visit (INDEPENDENT_AMBULATORY_CARE_PROVIDER_SITE_OTHER): Payer: Medicare Other | Admitting: Internal Medicine

## 2012-10-06 VITALS — BP 110/68 | HR 76 | Ht 67.0 in | Wt 168.0 lb

## 2012-10-06 DIAGNOSIS — Z86010 Personal history of colonic polyps: Secondary | ICD-10-CM

## 2012-10-06 DIAGNOSIS — R1013 Epigastric pain: Secondary | ICD-10-CM

## 2012-10-06 DIAGNOSIS — K219 Gastro-esophageal reflux disease without esophagitis: Secondary | ICD-10-CM

## 2012-10-06 NOTE — Progress Notes (Signed)
HISTORY OF PRESENT ILLNESS:  Stacy Ayers is a 71 y.o. 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 Patient presents today regarding new problems with upper abdominal pain. She reports developing problems with significant epigastric pain with radiation to the right side and right back. This began about 6 months ago. She states that the discomfort is reminiscent of "gallbladder pain". She underwent cholecystectomy in August of 2010. At that time, a cholangiogram was performed. Initial cholangiogram questioned distal filling defect. Subsequent cholangiogram reported as being adequate without obvious stone. She also had a suboptimal liver biopsy of the subcapsular region revealing nonspecific lymphocytic infiltrate. I last saw the patient in September 2012 regarding chronic diarrhea. She that dictation. She did undergo upper endoscopy in 2006 prior to her bypass surgery. This was unremarkable she does have a history of adenomatous colon polyps and has undergone previous colonoscopy in 2006 and most recently 2012. She is due for routine followup in 2017. Recent evaluation with her primary provider during episode of pain was performed. At that time (08/25/2012) her liver tests were abnormal with AST 88, ALT 131, alkaline phosphatase 171, total bilirubin 0.7. Patient had repeat liver tests when she was pain-free on 07/06/2012. These were normal except for mildly elevated ALT of 42. She states that episodes of pain generally last 20-30 minutes and then abruptly resolved. She has had no jaundice, dark urine, nausea, or vomiting. She did have her PPI change. She thinks this helps. Episodes of pain can occur several times per week her several times per day. There unpredictable in terms of time a day. No relation to food. May awaken her at night. Weight has been  stable. Possibly some transient fevers when she was on vacation recently in [place]. She continues with chronic diarrhea managed with antidiarrheals.  REVIEW OF SYSTEMS:  All non-GI ROS negative except for Back pain  Past Medical History  Diagnosis Date  . Iron deficiency anemia   . Asthma   . Diabetes mellitus   . Hyperlipidemia   . History of colon polyps     adenomatous  . Hypothyroidism   . Abnormal liver function tests   . Hypertension   . GERD (gastroesophageal reflux disease)   . IBS (irritable bowel syndrome)   . Diverticulosis     Past Surgical History  Procedure Date  . Abdominal hysterectomy   . Appendectomy   . Gastric bypass   . Coronary angioplasty   . Cholecystectomy   . Tonsillectomy and adenoidectomy   . Bladder repair   . Abdominal hysterectomy     Social History Stacy Ayers  reports that she quit smoking about 50 years ago. Her smoking use included Cigarettes. She has a .6 pack-year smoking history. She has never used smokeless tobacco. She reports that she drinks about 8.4 ounces of alcohol per week. She reports that she does not use illicit drugs.  family history includes Diabetes in her sister; Heart disease in her maternal grandmother; and Stroke in her mother.  Allergies  Allergen Reactions  . Codeine   . Doxycycline     REACTION: GI UPSET  . Montelukast Sodium   . Penicillins   . Pioglitazone   . Rosiglitazone Maleate   . Secobarbital Sodium   . Sulfonamide Derivatives        PHYSICAL EXAMINATION: Vital signs: BP 110/68  Pulse 76  Ht 5\' 7"  (1.702 m)  Wt 168 lb (76.204 kg)  BMI 26.31 kg/m2  Constitutional: generally well-appearing, no acute distress Psychiatric: alert and oriented x3, cooperative Eyes: extraocular movements intact, anicteric, conjunctiva pink Mouth: oral pharynx moist, no lesions Neck: supple no lymphadenopathy Cardiovascular: heart regular rate and rhythm, no murmur Lungs: clear to auscultation  bilaterally Abdomen: soft, mild epigastric right upper quadrant tenderness without guarding or rebound, nondistended, no obvious ascites, no peritoneal signs, normal bowel sounds, no organomegaly Extremities: no lower extremity edema bilaterally Skin: no lesions on visible extremities Neuro: No focal deficits.  ASSESSMENT:  #1. Recurrent epigastric, right upper quadrant, and right subscapular pain as described. Episode associated with elevated liver tests. Highly suspicious of choledocholithiasis. #2. Status post Roux-en-Y gastric bypass 2007 #3. GERD. On PPI. #4. History of adenomatous colon polyps. Surveillance up-to-date #5. Chronic diarrhea previously evaluated   PLAN:  #1. Normal ultrasound to rule out choledocholithiasis #2. If ultrasound negative, then MRCP #3. Should the patient need ERCP, then tertiary referral given her altered anatomy. She had her bypass at [facility] #4. Continue PPI  #5. Surveillance colonoscopy around 2017.

## 2012-10-06 NOTE — Patient Instructions (Addendum)
You have been scheduled for an abdominal ultrasound at [facility] (1st floor of hospital) on 10-13-12 at 9:00am. Please arrive 15 minutes prior to your appointment for registration. Make certain not to have anything to eat or drink after midnight prior to your appointment. Should you need to reschedule your appointment, please contact radiology at [phone]. This test typically takes about 30 minutes to perform.

## 2012-10-09 ENCOUNTER — Ambulatory Visit: Payer: BC Managed Care – PPO

## 2012-10-12 ENCOUNTER — Ambulatory Visit: Payer: BC Managed Care – PPO

## 2012-10-13 ENCOUNTER — Telehealth: Payer: Self-pay | Admitting: Internal Medicine

## 2012-10-13 ENCOUNTER — Other Ambulatory Visit: Payer: Self-pay | Admitting: Internal Medicine

## 2012-10-13 ENCOUNTER — Ambulatory Visit (INDEPENDENT_AMBULATORY_CARE_PROVIDER_SITE_OTHER): Payer: Medicare Other

## 2012-10-13 ENCOUNTER — Ambulatory Visit (HOSPITAL_COMMUNITY)
Admission: RE | Admit: 2012-10-13 | Discharge: 2012-10-13 | Disposition: A | Discharge status: Home / Self Care | Payer: Medicare Other | Source: Ambulatory Visit | Attending: Internal Medicine | Admitting: Internal Medicine

## 2012-10-13 DIAGNOSIS — Z9089 Acquired absence of other organs: Secondary | ICD-10-CM | POA: Insufficient documentation

## 2012-10-13 DIAGNOSIS — E538 Deficiency of other specified B group vitamins: Secondary | ICD-10-CM

## 2012-10-13 DIAGNOSIS — K8050 Calculus of bile duct without cholangitis or cholecystitis without obstruction: Secondary | ICD-10-CM

## 2012-10-13 DIAGNOSIS — R1013 Epigastric pain: Secondary | ICD-10-CM

## 2012-10-13 DIAGNOSIS — Q619 Cystic kidney disease, unspecified: Secondary | ICD-10-CM | POA: Insufficient documentation

## 2012-10-13 MED ORDER — CYANOCOBALAMIN 1000 MCG/ML IJ SOLN
1000.0000 ug | Freq: Once | INTRAMUSCULAR | Status: AC
  Administered 2012-10-13: 1000 ug via INTRAMUSCULAR

## 2012-10-13 NOTE — Telephone Encounter (Signed)
Spoke with pt regarding results, see result note. 

## 2012-10-18 ENCOUNTER — Telehealth: Payer: Self-pay | Admitting: Internal Medicine

## 2012-10-18 MED ORDER — TRAMADOL HCL 50 MG PO TABS: ORAL_TABLET | ORAL | Status: DC

## 2012-10-18 NOTE — Telephone Encounter (Signed)
Please increase Protonix to 40 mg po bid, send Tramadol 50mg , #15, 1 or 2 po q 4-6 hrs prn pain, keep her appointment for MRCP

## 2012-10-18 NOTE — Telephone Encounter (Signed)
Pt aware and rx sent to the pharmacy. 

## 2012-10-18 NOTE — Telephone Encounter (Signed)
Pt states she is having pain she thinks from a gallstone. Pt had an ultrasound that did not show a cause for the pain but she has an MRCP scheduled for tomorrow. Pt states the pain is on her right side under her shoulder blade area. Pt wants to know what she can do for the pain and discomfort. States that every time she eats she gets nauseated and she has the pain. States the pain episodes are starting to last longer. Dr. Juanda Chance as doc of the day please advise.

## 2012-10-19 ENCOUNTER — Ambulatory Visit (HOSPITAL_COMMUNITY)
Admission: RE | Admit: 2012-10-19 | Discharge: 2012-10-19 | Disposition: A | Discharge status: Home / Self Care | Payer: Medicare Other | Source: Ambulatory Visit | Attending: Internal Medicine | Admitting: Internal Medicine

## 2012-10-19 ENCOUNTER — Other Ambulatory Visit: Payer: Self-pay | Admitting: Internal Medicine

## 2012-10-19 ENCOUNTER — Other Ambulatory Visit: Payer: Self-pay | Admitting: *Deleted

## 2012-10-19 ENCOUNTER — Encounter: Payer: Self-pay | Admitting: *Deleted

## 2012-10-19 DIAGNOSIS — K8050 Calculus of bile duct without cholangitis or cholecystitis without obstruction: Secondary | ICD-10-CM

## 2012-10-19 DIAGNOSIS — Z9089 Acquired absence of other organs: Secondary | ICD-10-CM | POA: Insufficient documentation

## 2012-10-19 DIAGNOSIS — N281 Cyst of kidney, acquired: Secondary | ICD-10-CM | POA: Insufficient documentation

## 2012-10-19 DIAGNOSIS — K838 Other specified diseases of biliary tract: Secondary | ICD-10-CM | POA: Insufficient documentation

## 2012-10-19 DIAGNOSIS — R7989 Other specified abnormal findings of blood chemistry: Secondary | ICD-10-CM | POA: Insufficient documentation

## 2012-10-19 DIAGNOSIS — D3000 Benign neoplasm of unspecified kidney: Secondary | ICD-10-CM | POA: Insufficient documentation

## 2012-10-19 LAB — CREATININE, SERUM: Creatinine, Ser: 0.7 mg/dL (ref 0.50–1.10)

## 2012-10-19 MED ORDER — GADOBENATE DIMEGLUMINE 529 MG/ML IV SOLN
16.0000 mL | Freq: Once | INTRAVENOUS | Status: AC | PRN
  Administered 2012-10-19: 16 mL via INTRAVENOUS

## 2012-10-19 NOTE — Telephone Encounter (Signed)
error 

## 2012-10-20 ENCOUNTER — Ambulatory Visit (INDEPENDENT_AMBULATORY_CARE_PROVIDER_SITE_OTHER): Payer: Medicare Other | Admitting: Internal Medicine

## 2012-10-20 ENCOUNTER — Telehealth: Payer: Self-pay | Admitting: Internal Medicine

## 2012-10-20 ENCOUNTER — Encounter: Payer: Self-pay | Admitting: Internal Medicine

## 2012-10-20 ENCOUNTER — Ambulatory Visit: Payer: BC Managed Care – PPO

## 2012-10-20 ENCOUNTER — Other Ambulatory Visit (INDEPENDENT_AMBULATORY_CARE_PROVIDER_SITE_OTHER): Payer: Medicare Other

## 2012-10-20 VITALS — BP 128/80 | HR 78 | Temp 98.6°F | Ht 67.0 in | Wt 165.2 lb

## 2012-10-20 DIAGNOSIS — K8020 Calculus of gallbladder without cholecystitis without obstruction: Secondary | ICD-10-CM

## 2012-10-20 DIAGNOSIS — R1011 Right upper quadrant pain: Secondary | ICD-10-CM

## 2012-10-20 DIAGNOSIS — R7401 Elevation of levels of liver transaminase levels: Secondary | ICD-10-CM

## 2012-10-20 DIAGNOSIS — K8050 Calculus of bile duct without cholangitis or cholecystitis without obstruction: Secondary | ICD-10-CM

## 2012-10-20 LAB — HEPATIC FUNCTION PANEL
ALT: 299 U/L — ABNORMAL HIGH (ref 0–35)
Bilirubin, Direct: 0.5 mg/dL — ABNORMAL HIGH (ref 0.0–0.3)
Total Bilirubin: 1.6 mg/dL — ABNORMAL HIGH (ref 0.3–1.2)

## 2012-10-20 LAB — CBC WITH DIFFERENTIAL/PLATELET
Basophils Absolute: 0 10*3/uL (ref 0.0–0.1)
Eosinophils Absolute: 0.1 10*3/uL (ref 0.0–0.7)
Eosinophils Relative: 1 % (ref 0.0–5.0)
HCT: 37.8 % (ref 36.0–46.0)
Lymphs Abs: 0.8 10*3/uL (ref 0.7–4.0)
MCV: 93.7 fl (ref 78.0–100.0)
Monocytes Absolute: 0.4 10*3/uL (ref 0.1–1.0)
Neutrophils Relative %: 74.4 % (ref 43.0–77.0)
Platelets: 142 10*3/uL — ABNORMAL LOW (ref 150.0–400.0)
RDW: 13.5 % (ref 11.5–14.6)
WBC: 5.3 10*3/uL (ref 4.5–10.5)

## 2012-10-20 MED ORDER — CIPROFLOXACIN HCL 500 MG PO TABS: 500.0000 mg | ORAL_TABLET | Freq: Two times a day (BID) | ORAL | Status: DC

## 2012-10-20 NOTE — Progress Notes (Signed)
HISTORY OF PRESENT ILLNESS:  Stacy Ayers is a 71 y.o. female who has been seen in this office for adenomatous colon polyps. More recently for recurrent upper abdominal pain (see that dictation) associated with elevated liver tests. She is status post cholecystectomy as well as Roux-en-Y gastric bypass. Abdominal ultrasound was negative. MRCP revealed choledocholithiasis. Because of her surgically altered anatomy, I referred her to [facility] biliary service, Dr. Wyline Mood. She is to see him Monday. However, yesterday she developed abdominal pain and subjective fevers. Objective temperature measurement of 100. Still with symptoms today. Told to come in for blood work and an office visit. Blood work today reveals elevated liver tests. Normal white blood cell count. Temperature 99.6. She is accompanied by her daughter. No vomiting.  REVIEW OF SYSTEMS:  All non-GI ROS negative except for  Past Medical History  Diagnosis Date  . Iron deficiency anemia   . Asthma   . Diabetes mellitus   . Hyperlipidemia   . History of colon polyps     adenomatous  . Hypothyroidism   . Abnormal liver function tests   . Hypertension   . GERD (gastroesophageal reflux disease)   . IBS (irritable bowel syndrome)   . Diverticulosis     Past Surgical History  Procedure Date  . Abdominal hysterectomy   . Appendectomy   . Gastric bypass   . Coronary angioplasty   . Cholecystectomy   . Tonsillectomy and adenoidectomy   . Bladder repair   . Abdominal hysterectomy     Social History Stacy Ayers  reports that she quit smoking about 50 years ago. Her smoking use included Cigarettes. She has a .6 pack-year smoking history. She has never used smokeless tobacco. She reports that she drinks about 8.4 ounces of alcohol per week. She reports that she does not use illicit drugs.  family history includes Diabetes in her sister; Heart disease in her maternal grandmother; and Stroke in her mother.  Allergies  Allergen  Reactions  . Codeine   . Doxycycline     REACTION: GI UPSET  . Montelukast Sodium   . Penicillins   . Pioglitazone   . Rosiglitazone Maleate   . Secobarbital Sodium   . Sulfonamide Derivatives        PHYSICAL EXAMINATION:  Vital signs: BP 128/80  Pulse 78  Temp 98.6 F (37 C)  Ht 5\' 7"  (1.702 m)  Wt 165 lb 3.2 oz (74.934 kg)  BMI 25.87 kg/m2  SpO2 98% General: Somewhat fatigued appearing but otherwise Well-developed, well-nourished, no acute distress HEENT: Sclerae are anicteric, conjunctiva pink. Oral mucosa intact Lungs: Clear Heart: Regular Abdomen: soft, moderate tenderness in the right upper quadrant without rebound, nondistended, no obvious ascites, no peritoneal signs, normal bowel sounds. No organomegaly. Extremities: No edema Psychiatric: alert and oriented x3. Cooperative    ASSESSMENT:  #1. Symptomatic choledocholithiasis. Low-grade fever, rule out early cholangitis. #2. Status post remote cholecystectomy #3. Status post Roux-en-Y gastric bypass #4. History of adenomatous colon polyps  PLAN:  #1. Prescribe Cipro 500 mg twice a day. Take 2 doses today #2. Advise regarding signs or symptoms of significant cholangitis. If such, ER immediately. My partner, Dr. Christella Hartigan is on this weekend and is aware. There are where that he is aware and have been instructed to call for questions or problems. #3. Keep appointment at [facility] on Monday. They have copies of my office note, MRCP report, and labs

## 2012-10-20 NOTE — Telephone Encounter (Signed)
Spoke with pt and she is coming for labs now and will be seen by Dr. Marina Goodell at 2:30pm.

## 2012-10-20 NOTE — Telephone Encounter (Signed)
What?Fevers, chills, and pain???. She needs to come in today to be seen. Have her go to the lab for a CBC and liver function tests now. Send stat. Put her on my schedule for about 2:30 as an add on.

## 2012-10-20 NOTE — Patient Instructions (Addendum)
We have sent the following medications to your pharmacy for you to pick up at your convenience:  Cipro    

## 2012-10-20 NOTE — Telephone Encounter (Signed)
Spoke with pt. And let her know that I just spoke with [facility]. [facility] is going to call the pt today and get her in on Monday to be seen. Per [facility] pt is to take Tylenol for her fever and if it responds she is good. If the fever does not respond to Tylenol they advised pt to go to the ER. Pt verbalized understanding. Spoke with Candace at [facility] [phone].  Instructed pt to call me back if she did not hear from [facility] today, she verbalized understanding.

## 2012-11-03 ENCOUNTER — Telehealth: Payer: Self-pay | Admitting: Family Medicine

## 2012-11-03 MED ORDER — GLUCOSE BLOOD VI STRP: ORAL_STRIP | Status: DC

## 2012-11-03 NOTE — Telephone Encounter (Signed)
Pt states she needs test strips for the one touch ultra mini that Dr. Laury Axon gave her. Pt uses [employer]

## 2012-11-06 ENCOUNTER — Ambulatory Visit (INDEPENDENT_AMBULATORY_CARE_PROVIDER_SITE_OTHER): Payer: Medicare Other

## 2012-11-06 DIAGNOSIS — D518 Other vitamin B12 deficiency anemias: Secondary | ICD-10-CM

## 2012-11-06 MED ORDER — CYANOCOBALAMIN 1000 MCG/ML IJ SOLN
1000.0000 ug | Freq: Once | INTRAMUSCULAR | Status: AC
  Administered 2012-11-06: 1000 ug via INTRAMUSCULAR

## 2012-11-09 ENCOUNTER — Other Ambulatory Visit: Payer: Self-pay | Admitting: Family Medicine

## 2012-11-30 ENCOUNTER — Telehealth: Payer: Self-pay

## 2012-11-30 NOTE — Telephone Encounter (Signed)
Spoke with Marylene Land at [facility] regarding records we have been anticipating on pt.  Marylene Land stated Wyatt Mage was the correct contact and that she would send a message to [place] telling her the records we needed.  I gave Marylene Land the phone and fax number and she agreed to give this information to [place].

## 2012-12-25 ENCOUNTER — Other Ambulatory Visit: Payer: Self-pay | Admitting: Family Medicine

## 2013-03-29 ENCOUNTER — Other Ambulatory Visit: Payer: Self-pay | Admitting: Family Medicine

## 2013-04-18 ENCOUNTER — Encounter: Payer: Self-pay | Admitting: Family Medicine

## 2013-04-18 ENCOUNTER — Ambulatory Visit (INDEPENDENT_AMBULATORY_CARE_PROVIDER_SITE_OTHER): Payer: Medicare Other | Admitting: Family Medicine

## 2013-04-18 VITALS — BP 112/60 | HR 73 | Temp 99.0°F | Wt 176.4 lb

## 2013-04-18 DIAGNOSIS — R252 Cramp and spasm: Secondary | ICD-10-CM

## 2013-04-18 DIAGNOSIS — R5381 Other malaise: Secondary | ICD-10-CM

## 2013-04-18 DIAGNOSIS — R5383 Other fatigue: Secondary | ICD-10-CM

## 2013-04-18 DIAGNOSIS — F329 Major depressive disorder, single episode, unspecified: Secondary | ICD-10-CM

## 2013-04-18 DIAGNOSIS — M791 Myalgia, unspecified site: Secondary | ICD-10-CM

## 2013-04-18 DIAGNOSIS — IMO0001 Reserved for inherently not codable concepts without codable children: Secondary | ICD-10-CM

## 2013-04-18 DIAGNOSIS — R209 Unspecified disturbances of skin sensation: Secondary | ICD-10-CM

## 2013-04-18 LAB — CBC WITH DIFFERENTIAL/PLATELET
Basophils Absolute: 0 10*3/uL (ref 0.0–0.1)
HCT: 34.3 % — ABNORMAL LOW (ref 36.0–46.0)
Hemoglobin: 12 g/dL (ref 12.0–15.0)
Lymphs Abs: 1.4 10*3/uL (ref 0.7–4.0)
MCV: 93 fl (ref 78.0–100.0)
Monocytes Absolute: 0.2 10*3/uL (ref 0.1–1.0)
Monocytes Relative: 5.9 % (ref 3.0–12.0)
Neutro Abs: 2.2 10*3/uL (ref 1.4–7.7)
Platelets: 161 10*3/uL (ref 150.0–400.0)
RDW: 13.5 % (ref 11.5–14.6)

## 2013-04-18 LAB — BASIC METABOLIC PANEL
BUN: 11 mg/dL (ref 6–23)
CO2: 26 mEq/L (ref 19–32)
Chloride: 104 mEq/L (ref 96–112)
GFR: 87.95 mL/min (ref >60.00)
Glucose, Bld: 88 mg/dL (ref 70–99)
Potassium: 4 mEq/L (ref 3.5–5.1)
Sodium: 137 mEq/L (ref 135–145)

## 2013-04-18 LAB — HEPATIC FUNCTION PANEL
ALT: 18 U/L (ref 0–35)
AST: 20 U/L (ref 0–37)
Albumin: 3.8 g/dL (ref 3.5–5.2)

## 2013-04-18 LAB — TSH: TSH: 0.65 u[IU]/mL (ref 0.35–5.50)

## 2013-04-18 MED ORDER — BUPROPION HCL ER (XL) 150 MG PO TB24: ORAL_TABLET | ORAL | Status: DC

## 2013-04-18 MED ORDER — BUPROPION HCL ER (XL) 300 MG PO TB24: 300.0000 mg | ORAL_TABLET | Freq: Every day | ORAL | Status: DC

## 2013-04-18 NOTE — Progress Notes (Signed)
  Subjective:     Stacy Ayers is a 72 y.o. female who presents for evaluation of fatigue. Symptoms began several weeks ago. The patient feels the fatigue began with: a significant change in weight and she also admits to drinking a lot of wine and she is not sleeping well.. Symptoms of her fatigue have been diffuse soft tissue aches and pains and fatigue with paradoxical insomnia. Patient describes the following psychological symptoms: none. Patient denies exercise intolerance and significant change in weight. Symptoms have gradually worsened. Symptom severity: symptoms bothersome, but easily able to carry out all usual work/school/family activities. Previous visits for this problem: none.   The following portions of the patient's history were reviewed and updated as appropriate: allergies, current medications, past family history, past medical history, past social history, past surgical history and problem list.  Review of Systems Pertinent items are noted in HPI.    Objective:    BP 112/60  Pulse 73  Temp(Src) 99 F (37.2 C) (Oral)  Wt 176 lb 6.4 oz (80.015 kg)  BMI 27.62 kg/m2  SpO2 98% General appearance: alert, cooperative, appears stated age and no distress Throat: lips, mucosa, and tongue normal; teeth and gums normal Neck: no adenopathy, no carotid bruit, no JVD, supple, symmetrical, trachea midline and thyroid not enlarged, symmetric, no tenderness/mass/nodules Lungs: clear to auscultation bilaterally Heart: S1, S2 normal Extremities: extremities normal, atraumatic, no cyanosis or edema    Assessment:    fatigue with muscle aches   insomnia Plan:    Discussed diagnosis with patient. See orders for lab evaluation. Follow up in 3 weeks or as needed.  Cut down on wine intake Encouraged her to restart water aerobic Check labs

## 2013-04-18 NOTE — Patient Instructions (Addendum)

## 2013-04-19 LAB — POCT URINALYSIS DIPSTICK
Bilirubin, UA: NEGATIVE
Leukocytes, UA: NEGATIVE
Nitrite, UA: NEGATIVE
Protein, UA: NEGATIVE
pH, UA: 6

## 2013-04-22 LAB — VITAMIN D 1,25 DIHYDROXY
Vitamin D2 1, 25 (OH)2: <8 pg/mL
Vitamin D3 1, 25 (OH)2: 67 pg/mL

## 2013-04-25 ENCOUNTER — Ambulatory Visit (INDEPENDENT_AMBULATORY_CARE_PROVIDER_SITE_OTHER): Payer: Medicare Other | Admitting: *Deleted

## 2013-04-25 DIAGNOSIS — E538 Deficiency of other specified B group vitamins: Secondary | ICD-10-CM

## 2013-04-25 MED ORDER — CYANOCOBALAMIN 1000 MCG/ML IJ SOLN
1000.0000 ug | Freq: Once | INTRAMUSCULAR | Status: AC
  Administered 2013-04-25: 1000 ug via INTRAMUSCULAR

## 2013-04-30 ENCOUNTER — Other Ambulatory Visit: Payer: Self-pay | Admitting: Family Medicine

## 2013-05-03 ENCOUNTER — Other Ambulatory Visit: Payer: Self-pay | Admitting: Dermatology

## 2013-05-14 ENCOUNTER — Telehealth: Payer: Self-pay | Admitting: Family Medicine

## 2013-05-14 NOTE — Telephone Encounter (Signed)
Patient is returning call from office. Missed call on Friday, 6/13.

## 2013-05-15 NOTE — Telephone Encounter (Signed)
Notes Recorded by Lelon Perla, DO on 05/08/2013 at 8:41 AM Benign---- keratosis      Patient aware     KP

## 2013-05-22 ENCOUNTER — Ambulatory Visit (INDEPENDENT_AMBULATORY_CARE_PROVIDER_SITE_OTHER): Payer: Medicare Other | Admitting: Family Medicine

## 2013-05-22 ENCOUNTER — Encounter: Payer: Self-pay | Admitting: Family Medicine

## 2013-05-22 VITALS — BP 140/80 | HR 70 | Temp 98.2°F | Wt 174.4 lb

## 2013-05-22 DIAGNOSIS — F341 Dysthymic disorder: Secondary | ICD-10-CM

## 2013-05-22 DIAGNOSIS — E538 Deficiency of other specified B group vitamins: Secondary | ICD-10-CM

## 2013-05-22 DIAGNOSIS — F419 Anxiety disorder, unspecified: Secondary | ICD-10-CM

## 2013-05-22 DIAGNOSIS — F418 Other specified anxiety disorders: Secondary | ICD-10-CM

## 2013-05-22 DIAGNOSIS — F32A Depression, unspecified: Secondary | ICD-10-CM

## 2013-05-22 DIAGNOSIS — F329 Major depressive disorder, single episode, unspecified: Secondary | ICD-10-CM

## 2013-05-22 MED ORDER — BUPROPION HCL ER (XL) 150 MG PO TB24: 150.0000 mg | ORAL_TABLET | Freq: Every day | ORAL | Status: DC

## 2013-05-22 MED ORDER — VENLAFAXINE HCL ER 37.5 MG PO CP24: ORAL_CAPSULE | ORAL | Status: DC

## 2013-05-22 MED ORDER — VENLAFAXINE HCL ER 75 MG PO CP24: 75.0000 mg | ORAL_CAPSULE | Freq: Every day | ORAL | Status: DC

## 2013-05-22 MED ORDER — CYANOCOBALAMIN 1000 MCG/ML IJ SOLN
1000.0000 ug | Freq: Once | INTRAMUSCULAR | Status: AC
  Administered 2013-05-22: 1000 ug via INTRAMUSCULAR

## 2013-05-22 NOTE — Assessment & Plan Note (Signed)
b12 #2 given today rto 1 week

## 2013-05-22 NOTE — Patient Instructions (Signed)

## 2013-05-22 NOTE — Assessment & Plan Note (Signed)
Dec wellbutrin 150 mg for 1-2 weeks Start effexor 37.5 1 po qd for 1 week then 75 mg daily rto 1 month or sooner prn

## 2013-05-22 NOTE — Progress Notes (Signed)
  Subjective:    Patient ID: Stacy Ayers, female    DOB: [birth year], 72 y.o.   MRN: [id number]  HPI Pt here to discuss depression/ anxiety.  The wellbutrin is making her feel anxious and drying out her mouth.  It has been a month and things are getting worse.   Pt also would like her b12 shot if we have it.     Review of Systems As above    Objective:   Physical Exam BP 140/80  Pulse 70  Temp(Src) 98.2 F (36.8 C) (Oral)  Wt 174 lb 6.4 oz (79.107 kg)  BMI 27.31 kg/m2  SpO2 97% General appearance: alert, cooperative, appears stated age and no distress Neurologic: Mental status: Alert, oriented, thought content appropriate Psych-- normal appearance ,  Pt does not anxious or severely depressed at this time                Pt is not suicidal or homicidal       Assessment & Plan:

## 2013-05-23 ENCOUNTER — Other Ambulatory Visit: Payer: Self-pay | Admitting: Family Medicine

## 2013-06-26 ENCOUNTER — Other Ambulatory Visit: Payer: Self-pay | Admitting: Family Medicine

## 2013-06-29 NOTE — Telephone Encounter (Signed)
Refill request for  Last filled by MD on - Last seen: F/U appt: 

## 2013-07-17 ENCOUNTER — Ambulatory Visit (INDEPENDENT_AMBULATORY_CARE_PROVIDER_SITE_OTHER): Payer: Medicare Other | Admitting: Family Medicine

## 2013-07-17 ENCOUNTER — Encounter: Payer: Self-pay | Admitting: Family Medicine

## 2013-07-17 VITALS — BP 128/62 | HR 74 | Temp 98.9°F | Wt 176.2 lb

## 2013-07-17 DIAGNOSIS — M549 Dorsalgia, unspecified: Secondary | ICD-10-CM

## 2013-07-17 DIAGNOSIS — M1710 Unilateral primary osteoarthritis, unspecified knee: Secondary | ICD-10-CM

## 2013-07-17 DIAGNOSIS — E538 Deficiency of other specified B group vitamins: Secondary | ICD-10-CM

## 2013-07-17 DIAGNOSIS — IMO0002 Reserved for concepts with insufficient information to code with codable children: Secondary | ICD-10-CM

## 2013-07-17 MED ORDER — CYANOCOBALAMIN 1000 MCG/ML IJ SOLN
1000.0000 ug | Freq: Once | INTRAMUSCULAR | Status: AC
  Administered 2013-07-17: 1000 ug via INTRAMUSCULAR

## 2013-07-17 MED ORDER — GLUCOSE BLOOD VI STRP: ORAL_STRIP | Status: DC

## 2013-07-17 NOTE — Patient Instructions (Signed)
Back Pain, Adult  Low back pain is very common. About 1 in 5 people have back pain. The cause of low back pain is rarely dangerous. The pain often gets better over time. About half of people with a sudden onset of back pain feel better in just 2 weeks. About 8 in 10 people feel better by 6 weeks.   CAUSES  Some common causes of back pain include:  · Strain of the muscles or ligaments supporting the spine.  · Wear and tear (degeneration) of the spinal discs.  · Arthritis.  · Direct injury to the back.  DIAGNOSIS  Most of the time, the direct cause of low back pain is not known. However, back pain can be treated effectively even when the exact cause of the pain is unknown. Answering your caregiver's questions about your overall health and symptoms is one of the most accurate ways to make sure the cause of your pain is not dangerous. If your caregiver needs more information, he or she may order lab work or imaging tests (X-rays or MRIs). However, even if imaging tests show changes in your back, this usually does not require surgery.  HOME CARE INSTRUCTIONS  For many people, back pain returns. Since low back pain is rarely dangerous, it is often a condition that people can learn to manage on their own.   · Remain active. It is stressful on the back to sit or stand in one place. Do not sit, drive, or stand in one place for more than 30 minutes at a time. Take short walks on level surfaces as soon as pain allows. Try to increase the length of time you walk each day.  · Do not stay in bed. Resting more than 1 or 2 days can delay your recovery.  · Do not avoid exercise or work. Your body is made to move. It is not dangerous to be active, even though your back may hurt. Your back will likely heal faster if you return to being active before your pain is gone.  · Pay attention to your body when you  bend and lift. Many people have less discomfort when lifting if they bend their knees, keep the load close to their bodies, and  avoid twisting. Often, the most comfortable positions are those that put less stress on your recovering back.  · Find a comfortable position to sleep. Use a firm mattress and lie on your side with your knees slightly bent. If you lie on your back, put a pillow under your knees.  · Only take over-the-counter or prescription medicines as directed by your caregiver. Over-the-counter medicines to reduce pain and inflammation are often the most helpful. Your caregiver may prescribe muscle relaxant drugs. These medicines help dull your pain so you can more quickly return to your normal activities and healthy exercise.  · Put ice on the injured area.  · Put ice in a plastic bag.  · Place a towel between your skin and the bag.  · Leave the ice on for 15-20 minutes, 3-4 times a day for the first 2 to 3 days. After that, ice and heat may be alternated to reduce pain and spasms.  · Ask your caregiver about trying back exercises and gentle massage. This may be of some benefit.  · Avoid feeling anxious or stressed. Stress increases muscle tension and can worsen back pain. It is important to recognize when you are anxious or stressed and learn ways to manage it. Exercise is a great option.  SEEK MEDICAL CARE IF:  · You have pain that is not relieved with rest or   medicine.  · You have pain that does not improve in 1 week.  · You have new symptoms.  · You are generally not feeling well.  SEEK IMMEDIATE MEDICAL CARE IF:   · You have pain that radiates from your back into your legs.  · You develop new bowel or bladder control problems.  · You have unusual weakness or numbness in your arms or legs.  · You develop nausea or vomiting.  · You develop abdominal pain.  · You feel faint.  Document Released: 11/15/2005 Document Revised: 05/16/2012 Document Reviewed: 04/05/2011  ExitCare® Patient Information ©2014 [employer].

## 2013-07-17 NOTE — Progress Notes (Signed)
  Subjective:    Stacy Ayers is a 72 y.o. female who presents for follow up of low back problems. Current symptoms include: pain in L hip (burning in character; 5/10 in severity) and weakness in L leg with L knee pain too.. Symptoms have significantly worsened from the previous visit. Exacerbating factors identified by the patient are walking steps and getting up and down.. Pt knees has bothered her for years.   No known injury.     The following portions of the patient's history were reviewed and updated as appropriate: allergies, current medications, past family history, past medical history, past social history, past surgical history and problem list.    Objective:    BP 128/62  Pulse 74  Temp(Src) 98.9 F (37.2 C) (Oral)  Wt 176 lb 3.2 oz (79.924 kg)  BMI 27.59 kg/m2  SpO2 97% General appearance: alert, cooperative, appears stated age and no distress Extremities: extremities normal, atraumatic, no cyanosis or edema Neurologic: Motor: + weakness L hip flexors and Llow ext  Reflexes: 2+ and symmetric Gait: Antalgic    Assessment:    Nonspecific acute low back pain    Plan:    Short (2-4 day) period of relative rest recommended until acute symptoms improve. Ice to affected area as needed for local pain relief. Heat to affected area as needed for local pain relief. MRI of the affected area due to presence of L leg weakness and radiculopathy. Orthopedic referral due to pain and weakness.

## 2013-07-21 ENCOUNTER — Ambulatory Visit (HOSPITAL_BASED_OUTPATIENT_CLINIC_OR_DEPARTMENT_OTHER)
Admission: RE | Admit: 2013-07-21 | Discharge: 2013-07-21 | Disposition: A | Discharge status: Home / Self Care | Payer: Medicare Other | Source: Ambulatory Visit | Attending: Family Medicine | Admitting: Family Medicine

## 2013-07-21 DIAGNOSIS — M5137 Other intervertebral disc degeneration, lumbosacral region: Secondary | ICD-10-CM | POA: Insufficient documentation

## 2013-07-21 DIAGNOSIS — M5380 Other specified dorsopathies, site unspecified: Secondary | ICD-10-CM | POA: Insufficient documentation

## 2013-07-21 DIAGNOSIS — M51379 Other intervertebral disc degeneration, lumbosacral region without mention of lumbar back pain or lower extremity pain: Secondary | ICD-10-CM | POA: Insufficient documentation

## 2013-07-21 DIAGNOSIS — M549 Dorsalgia, unspecified: Secondary | ICD-10-CM

## 2013-08-01 ENCOUNTER — Other Ambulatory Visit: Payer: Self-pay | Admitting: Family Medicine

## 2013-08-01 DIAGNOSIS — M545 Low back pain: Secondary | ICD-10-CM

## 2013-10-04 ENCOUNTER — Other Ambulatory Visit: Payer: Self-pay

## 2013-11-24 ENCOUNTER — Other Ambulatory Visit: Payer: Self-pay | Admitting: Family Medicine

## 2013-12-13 ENCOUNTER — Encounter: Payer: Self-pay | Admitting: Family Medicine

## 2013-12-13 ENCOUNTER — Ambulatory Visit (INDEPENDENT_AMBULATORY_CARE_PROVIDER_SITE_OTHER): Payer: Medicare Other | Admitting: Family Medicine

## 2013-12-13 VITALS — BP 120/72 | HR 72 | Temp 98.9°F | Wt 180.4 lb

## 2013-12-13 DIAGNOSIS — J019 Acute sinusitis, unspecified: Secondary | ICD-10-CM

## 2013-12-13 DIAGNOSIS — J45901 Unspecified asthma with (acute) exacerbation: Secondary | ICD-10-CM

## 2013-12-13 MED ORDER — CLARITHROMYCIN ER 500 MG PO TB24
1000.0000 mg | ORAL_TABLET | Freq: Every day | ORAL | Status: AC
Start: 2013-12-13 — End: 2013-12-23

## 2013-12-13 MED ORDER — FLUTICASONE PROPIONATE 50 MCG/ACT NA SUSP: 2.0000 | Freq: Every day | NASAL | Status: DC

## 2013-12-13 NOTE — Progress Notes (Signed)
  Subjective:     Stacy Ayers is a 73 y.o. female who presents for evaluation of sinus pain. Symptoms include: congestion, cough, facial pain, nasal congestion, sinus pressure and wheezing and ear pain. Onset of symptoms was 1 week ago. Symptoms have been gradually worsening since that time.--- getting somewhat better but not completely.  Past history is significant for asthma. Patient is a non-smoker.    The following portions of the patient's history were reviewed and updated as appropriate: allergies, current medications, past family history, past medical history, past social history, past surgical history and problem list.  Review of Systems Pertinent items are noted in HPI.   Objective:    BP 120/72  Pulse 72  Temp(Src) 98.9 F (37.2 C) (Oral)  Wt 180 lb 6.4 oz (81.829 kg)  SpO2 97% General appearance: alert, cooperative, appears stated age and no distress Ears: normal TM's and external ear canals both ears Nose: green discharge, moderate congestion, turbinates red, swollen, sinus tenderness bilateral Throat: lips, mucosa, and tongue normal; teeth and gums normal Neck: mild anterior cervical adenopathy, supple, symmetrical, trachea midline and thyroid not enlarged, symmetric, no tenderness/mass/nodules Lungs: diminished breath sounds bilaterally Heart: S1, S2 normal Extremities: extremities normal, atraumatic, no cyanosis or edema    Assessment:    Acute bacterial sinusitis.    Plan:    Neti pot recommended. Instructions given. Nasal steroids per medication orders. Antihistamines per medication orders. Biaxin per medication orders. f/u prn

## 2013-12-13 NOTE — Progress Notes (Signed)
Pre visit review using our clinic review tool, if applicable. No additional management support is needed unless otherwise documented below in the visit note. 

## 2013-12-13 NOTE — Patient Instructions (Signed)

## 2013-12-19 ENCOUNTER — Ambulatory Visit (INDEPENDENT_AMBULATORY_CARE_PROVIDER_SITE_OTHER): Payer: Medicare Other | Admitting: Family Medicine

## 2013-12-19 VITALS — BP 142/88 | HR 85 | Temp 98.8°F | Resp 14 | Wt 175.4 lb

## 2013-12-19 DIAGNOSIS — J019 Acute sinusitis, unspecified: Secondary | ICD-10-CM

## 2013-12-19 DIAGNOSIS — J309 Allergic rhinitis, unspecified: Secondary | ICD-10-CM | POA: Insufficient documentation

## 2013-12-19 NOTE — Progress Notes (Signed)
Pre visit review using our clinic review tool, if applicable. No additional management support is needed unless otherwise documented below in the visit note. 

## 2013-12-19 NOTE — Assessment & Plan Note (Signed)
Pt currently being treated appropriately for infxn.  Will not change abx as pt needs to give this more time to work.  Add supportive care in form of antihistamine and Coricidin.  Reviewed supportive care and red flags that should prompt return.  Pt expressed understanding and is in agreement w/ plan.

## 2013-12-19 NOTE — Patient Instructions (Signed)
Follow up as needed Finish the Biaxin as directed Start Coricidin HBP for the congestion Add Claritin or Zyrtec daily for the allergy component Drink plenty of fluids REST! Call with any questions or concerns Hang in there!

## 2013-12-19 NOTE — Progress Notes (Signed)
   Subjective:    Patient ID: Stacy Ayers, female    DOB: July 20, 1941, 73 y.o.   MRN: [id number]  HPI URI- was seen earlier w/ laryngitis and sinusitis and tx'd w/ abx.  L side is now 'running and i can't get it to stop'.  Took antihistamine.  L ear pain, swollen glands, throat pain, sinus pressure.  Currently on Biaxin and using Flonase.   Review of Systems For ROS see HPI     Objective:   Physical Exam  Vitals reviewed. Constitutional: She appears well-developed and well-nourished. No distress.  HENT:  Head: Normocephalic and atraumatic.  Right Ear: Tympanic membrane normal.  Left Ear: Tympanic membrane normal.  Nose: Mucosal edema and rhinorrhea present. Right sinus exhibits no maxillary sinus tenderness and no frontal sinus tenderness. Left sinus exhibits maxillary sinus tenderness. Left sinus exhibits no frontal sinus tenderness.  Mouth/Throat: Uvula is midline and mucous membranes are normal. Posterior oropharyngeal erythema present. No oropharyngeal exudate.  Eyes: Conjunctivae and EOM are normal. Pupils are equal, round, and reactive to light.  Neck: Normal range of motion. Neck supple.  Cardiovascular: Normal rate, regular rhythm and normal heart sounds.   Pulmonary/Chest: Effort normal and breath sounds normal. No respiratory distress. She has no wheezes.  Lymphadenopathy:    She has no cervical adenopathy.          Assessment & Plan:

## 2013-12-19 NOTE — Assessment & Plan Note (Signed)
New.  Add Coricidin HBP and OTC antihistamine for better control.  Pt expressed understanding and is in agreement w/ plan.

## 2013-12-31 ENCOUNTER — Telehealth: Payer: Self-pay

## 2013-12-31 NOTE — Telephone Encounter (Signed)
Granddaughter has been diagnosed with the flu.  Wanted to know what she should do to keep from getting the flu.  She was advised to maintain hand hygiene, eat healthy, drink fluids to keep immune system up and to monitor for symptoms.  If she starts to exhibit symptoms, to call for an appointment.  Client stated understanding.

## 2014-02-11 ENCOUNTER — Other Ambulatory Visit: Payer: Self-pay

## 2014-02-11 DIAGNOSIS — F419 Anxiety disorder, unspecified: Principal | ICD-10-CM

## 2014-02-11 DIAGNOSIS — F329 Major depressive disorder, single episode, unspecified: Secondary | ICD-10-CM

## 2014-02-11 MED ORDER — VENLAFAXINE HCL ER 75 MG PO CP24: 75.0000 mg | ORAL_CAPSULE | Freq: Every day | ORAL | Status: DC

## 2014-03-07 ENCOUNTER — Other Ambulatory Visit: Payer: Self-pay | Admitting: Family Medicine

## 2014-03-11 ENCOUNTER — Encounter: Payer: Self-pay | Admitting: Family Medicine

## 2014-03-11 ENCOUNTER — Ambulatory Visit (INDEPENDENT_AMBULATORY_CARE_PROVIDER_SITE_OTHER): Payer: Medicare Other | Admitting: Family Medicine

## 2014-03-11 VITALS — BP 110/72 | HR 71 | Temp 97.5°F | Wt 175.0 lb

## 2014-03-11 DIAGNOSIS — R5383 Other fatigue: Principal | ICD-10-CM

## 2014-03-11 DIAGNOSIS — E2839 Other primary ovarian failure: Secondary | ICD-10-CM

## 2014-03-11 DIAGNOSIS — E538 Deficiency of other specified B group vitamins: Secondary | ICD-10-CM

## 2014-03-11 DIAGNOSIS — R5381 Other malaise: Secondary | ICD-10-CM

## 2014-03-11 LAB — BASIC METABOLIC PANEL
BUN: 17 mg/dL (ref 6–23)
CO2: 26 mEq/L (ref 19–32)
CREATININE: 0.7 mg/dL (ref 0.4–1.2)
Calcium: 9.1 mg/dL (ref 8.4–10.5)
Chloride: 104 mEq/L (ref 96–112)
GFR: 93.89 mL/min (ref >60.00)
GLUCOSE: 90 mg/dL (ref 70–99)
POTASSIUM: 3.9 meq/L (ref 3.5–5.1)
Sodium: 138 mEq/L (ref 135–145)

## 2014-03-11 LAB — CBC WITH DIFFERENTIAL/PLATELET
Basophils Absolute: 0 10*3/uL (ref 0.0–0.1)
Basophils Relative: 0.6 % (ref 0.0–3.0)
Eosinophils Absolute: 0 10*3/uL (ref 0.0–0.7)
Eosinophils Relative: 0.8 % (ref 0.0–5.0)
HEMATOCRIT: 35.7 % — AB (ref 36.0–46.0)
Hemoglobin: 12.1 g/dL (ref 12.0–15.0)
Lymphocytes Relative: 34.2 % (ref 12.0–46.0)
Lymphs Abs: 1.9 10*3/uL (ref 0.7–4.0)
MCHC: 33.9 g/dL (ref 30.0–36.0)
MCV: 94.7 fl (ref 78.0–100.0)
MONO ABS: 0.3 10*3/uL (ref 0.1–1.0)
Monocytes Relative: 5.4 % (ref 3.0–12.0)
NEUTROS PCT: 59 % (ref 43.0–77.0)
Neutro Abs: 3.3 10*3/uL (ref 1.4–7.7)
PLATELETS: 193 10*3/uL (ref 150.0–400.0)
RBC: 3.77 Mil/uL — ABNORMAL LOW (ref 3.87–5.11)
RDW: 12.9 % (ref 11.5–14.6)
WBC: 5.7 10*3/uL (ref 4.5–10.5)

## 2014-03-11 LAB — HEPATIC FUNCTION PANEL
ALT: 26 U/L (ref 0–35)
AST: 29 U/L (ref 0–37)
Albumin: 3.9 g/dL (ref 3.5–5.2)
Alkaline Phosphatase: 54 U/L (ref 39–117)
BILIRUBIN DIRECT: 0.1 mg/dL (ref 0.0–0.3)
BILIRUBIN TOTAL: 0.4 mg/dL (ref 0.3–1.2)
Total Protein: 6.8 g/dL (ref 6.0–8.3)

## 2014-03-11 LAB — TSH: TSH: 1.39 u[IU]/mL (ref 0.35–5.50)

## 2014-03-11 LAB — VITAMIN B12: VITAMIN B 12: 189 pg/mL — AB (ref 211–911)

## 2014-03-11 MED ORDER — CYANOCOBALAMIN 1000 MCG/ML IJ SOLN
1000.0000 ug | Freq: Once | INTRAMUSCULAR | Status: AC
  Administered 2014-03-11: 1000 ug via INTRAMUSCULAR

## 2014-03-11 NOTE — Addendum Note (Signed)
Addended by: Rudene Anda on: 03/11/2014 02:37 PM   Modules accepted: Orders

## 2014-03-11 NOTE — Progress Notes (Signed)
Pre visit review using our clinic review tool, if applicable. No additional management support is needed unless otherwise documented below in the visit note. 

## 2014-03-11 NOTE — Progress Notes (Signed)
  Subjective:     Stacy Ayers is a 73 y.o. female who presents for evaluation of fatigue. Symptoms began several months ago. The patient feels the fatigue began with: exercise intolerance. Symptoms of her fatigue have been general malaise. Patient describes the following psychological symptoms: none. Patient denies cold intolerance, constipation, excessive menstrual bleeding, fever, GI blood loss, significant change in weight, symptoms of arthritis, unusual rashes and witnessed or suspected sleep apnea. Symptoms have gradually worsened. Symptom severity: symptoms bothersome, but easily able to carry out all usual work/school/family activities. Previous visits for this problem: yes-- see last visit. Pt was dx with b12 def but never con't with b12 injections.  The following portions of the patient's history were reviewed and updated as appropriate: allergies, current medications, past family history, past medical history, past social history, past surgical history and problem list.  Review of Systems Pertinent items are noted in HPI.    Objective:    BP 110/72  Pulse 71  Temp(Src) 97.5 F (36.4 C) (Oral)  Wt 175 lb (79.379 kg)  SpO2 96% General appearance: alert, cooperative, appears stated age and no distress Neck: no adenopathy, supple, symmetrical, trachea midline and thyroid not enlarged, symmetric, no tenderness/mass/nodules Lungs: clear to auscultation bilaterally Heart: S1, S2 normal Extremities: extremities normal, atraumatic, no cyanosis or edema    Assessment:    Fatigue, organic cause likely.  Differential diagnoses includes: anemia due to b12 deficiency.    Plan:    Discussed diagnosis with patient. See orders for lab evaluation. Follow up in 1 week or as needed. ---b12 injections

## 2014-03-11 NOTE — Patient Instructions (Signed)

## 2014-03-12 LAB — POCT URINALYSIS DIPSTICK
BILIRUBIN UA: NEGATIVE
Blood, UA: NEGATIVE
Glucose, UA: NEGATIVE
KETONES UA: NEGATIVE
LEUKOCYTES UA: NEGATIVE
Nitrite, UA: NEGATIVE
Protein, UA: NEGATIVE
Urobilinogen, UA: 0.2
pH, UA: 6

## 2014-03-18 ENCOUNTER — Ambulatory Visit (INDEPENDENT_AMBULATORY_CARE_PROVIDER_SITE_OTHER): Payer: Medicare Other | Admitting: *Deleted

## 2014-03-18 DIAGNOSIS — E538 Deficiency of other specified B group vitamins: Secondary | ICD-10-CM

## 2014-03-18 LAB — VITAMIN D 1,25 DIHYDROXY
VITAMIN D 1, 25 (OH) TOTAL: 51 pg/mL (ref 18–72)
VITAMIN D3 1, 25 (OH): 51 pg/mL
Vitamin D2 1, 25 (OH)2: <8 pg/mL

## 2014-03-18 MED ORDER — CYANOCOBALAMIN 1000 MCG/ML IJ SOLN
1000.0000 ug | Freq: Once | INTRAMUSCULAR | Status: AC
  Administered 2014-03-18: 1000 ug via INTRAMUSCULAR

## 2014-03-25 ENCOUNTER — Ambulatory Visit (INDEPENDENT_AMBULATORY_CARE_PROVIDER_SITE_OTHER): Payer: Medicare Other

## 2014-03-25 DIAGNOSIS — E538 Deficiency of other specified B group vitamins: Secondary | ICD-10-CM

## 2014-03-25 MED ORDER — CYANOCOBALAMIN 1000 MCG/ML IJ SOLN
1000.0000 ug | Freq: Once | INTRAMUSCULAR | Status: AC
  Administered 2014-03-25: 1000 ug via INTRAMUSCULAR

## 2014-04-02 ENCOUNTER — Ambulatory Visit (INDEPENDENT_AMBULATORY_CARE_PROVIDER_SITE_OTHER): Payer: Medicare Other

## 2014-04-02 DIAGNOSIS — E538 Deficiency of other specified B group vitamins: Secondary | ICD-10-CM

## 2014-04-02 MED ORDER — CYANOCOBALAMIN 1000 MCG/ML IJ SOLN
1000.0000 ug | Freq: Once | INTRAMUSCULAR | Status: AC
  Administered 2014-04-02: 1000 ug via INTRAMUSCULAR

## 2014-05-03 ENCOUNTER — Ambulatory Visit (INDEPENDENT_AMBULATORY_CARE_PROVIDER_SITE_OTHER): Payer: Medicare Other | Admitting: *Deleted

## 2014-05-03 ENCOUNTER — Telehealth: Payer: Self-pay | Admitting: Family Medicine

## 2014-05-03 DIAGNOSIS — E538 Deficiency of other specified B group vitamins: Secondary | ICD-10-CM

## 2014-05-03 MED ORDER — CYANOCOBALAMIN 1000 MCG/ML IJ SOLN
1000.0000 ug | Freq: Once | INTRAMUSCULAR | Status: AC
  Administered 2014-05-03: 1000 ug via INTRAMUSCULAR

## 2014-05-03 MED ORDER — LEVOTHYROXINE SODIUM 125 MCG PO TABS
ORAL_TABLET | ORAL | Status: DC
Start: 2014-05-03 — End: 2015-07-03

## 2014-05-03 NOTE — Telephone Encounter (Signed)
Pt came in today for her monthly B12 inj and requested at time of check out a refill on her Synthroid.  Her pharmacy is [facility].  Please advise.

## 2014-05-03 NOTE — Telephone Encounter (Signed)
Rx has been faxed.    KP 

## 2014-05-23 ENCOUNTER — Telehealth: Payer: Self-pay | Admitting: Family Medicine

## 2014-05-23 NOTE — Telephone Encounter (Signed)
Patient called to schedule a sick appt for Laryngitis possibly. I informed patient that we did not have any appointments available but we could try other offices. Patient declined.

## 2014-06-04 ENCOUNTER — Ambulatory Visit (INDEPENDENT_AMBULATORY_CARE_PROVIDER_SITE_OTHER): Payer: Medicare Other

## 2014-06-04 DIAGNOSIS — E538 Deficiency of other specified B group vitamins: Secondary | ICD-10-CM

## 2014-06-04 MED ORDER — CYANOCOBALAMIN 1000 MCG/ML IJ SOLN
1000.0000 ug | Freq: Once | INTRAMUSCULAR | Status: AC
  Administered 2014-06-04: 1000 ug via INTRAMUSCULAR

## 2014-06-04 NOTE — Patient Instructions (Signed)
Return on July 09, 2014 @ 9:15 AM for monthly Vitamin B-12 injection.

## 2014-07-09 ENCOUNTER — Ambulatory Visit (INDEPENDENT_AMBULATORY_CARE_PROVIDER_SITE_OTHER): Payer: Medicare Other

## 2014-07-09 DIAGNOSIS — E538 Deficiency of other specified B group vitamins: Secondary | ICD-10-CM

## 2014-07-09 MED ORDER — CYANOCOBALAMIN 1000 MCG/ML IJ SOLN
1000.0000 ug | Freq: Once | INTRAMUSCULAR | Status: AC
  Administered 2014-07-09: 1000 ug via INTRAMUSCULAR

## 2014-07-09 NOTE — Progress Notes (Signed)
Pre visit review using our clinic review tool, if applicable. No additional management support is needed unless otherwise documented below in the visit note. 

## 2014-07-09 NOTE — Progress Notes (Signed)
Pt tolerated injection well. No signs of a reaction noted.

## 2014-07-22 ENCOUNTER — Telehealth: Payer: Self-pay

## 2014-07-22 NOTE — Telephone Encounter (Signed)
Mail box was full.   Will send letter  Diabetic bundle pt.

## 2014-08-09 ENCOUNTER — Ambulatory Visit (INDEPENDENT_AMBULATORY_CARE_PROVIDER_SITE_OTHER): Payer: Medicare Other

## 2014-08-09 DIAGNOSIS — Z23 Encounter for immunization: Secondary | ICD-10-CM

## 2014-08-09 DIAGNOSIS — E538 Deficiency of other specified B group vitamins: Secondary | ICD-10-CM

## 2014-08-09 MED ORDER — CYANOCOBALAMIN 1000 MCG/ML IJ SOLN
1000.0000 ug | Freq: Once | INTRAMUSCULAR | Status: AC
  Administered 2014-08-09: 1000 ug via INTRAMUSCULAR

## 2014-08-09 NOTE — Progress Notes (Signed)
Pt came in for b12 injection and also requested a flu shot.  Both vitamin b12 injection and flu shot given.  Pt tolerated both injections well.  No signs of reaction upon leaving the clinic.

## 2014-08-20 ENCOUNTER — Other Ambulatory Visit: Payer: Self-pay | Admitting: Family Medicine

## 2014-08-21 ENCOUNTER — Telehealth: Payer: Self-pay

## 2014-08-21 NOTE — Telephone Encounter (Signed)
LVM for pt to return my call.   RE: schedule AWV.

## 2014-09-10 ENCOUNTER — Ambulatory Visit (INDEPENDENT_AMBULATORY_CARE_PROVIDER_SITE_OTHER): Payer: Medicare Other

## 2014-09-10 DIAGNOSIS — E538 Deficiency of other specified B group vitamins: Secondary | ICD-10-CM

## 2014-09-10 MED ORDER — CYANOCOBALAMIN 1000 MCG/ML IJ SOLN: 1000.0000 ug | Freq: Once | INTRAMUSCULAR | Status: DC

## 2014-09-10 MED ORDER — CYANOCOBALAMIN 1000 MCG/ML IJ SOLN
1000.0000 ug | Freq: Once | INTRAMUSCULAR | Status: AC
  Administered 2014-09-10: 1000 ug via INTRAMUSCULAR

## 2014-09-15 ENCOUNTER — Other Ambulatory Visit: Payer: Self-pay | Admitting: Family Medicine

## 2014-10-10 ENCOUNTER — Ambulatory Visit (INDEPENDENT_AMBULATORY_CARE_PROVIDER_SITE_OTHER): Payer: Medicare Other

## 2014-10-10 ENCOUNTER — Ambulatory Visit: Payer: Medicare Other

## 2014-10-10 DIAGNOSIS — E538 Deficiency of other specified B group vitamins: Secondary | ICD-10-CM

## 2014-10-10 MED ORDER — CYANOCOBALAMIN 1000 MCG/ML IJ SOLN
1000.0000 ug | Freq: Once | INTRAMUSCULAR | Status: AC
  Administered 2014-10-10: 1000 ug via INTRAMUSCULAR

## 2014-10-10 NOTE — Progress Notes (Signed)
Pt tolerated injection well

## 2014-10-10 NOTE — Progress Notes (Signed)
Pre visit review using our clinic review tool, if applicable. No additional management support is needed unless otherwise documented below in the visit note. 

## 2014-11-08 ENCOUNTER — Ambulatory Visit (INDEPENDENT_AMBULATORY_CARE_PROVIDER_SITE_OTHER): Payer: Medicare Other | Admitting: *Deleted

## 2014-11-08 DIAGNOSIS — E538 Deficiency of other specified B group vitamins: Secondary | ICD-10-CM

## 2014-11-11 MED ORDER — CYANOCOBALAMIN 1000 MCG/ML IJ SOLN
1000.0000 ug | Freq: Once | INTRAMUSCULAR | Status: AC
  Administered 2014-11-11: 1000 ug via INTRAMUSCULAR

## 2014-11-11 NOTE — Progress Notes (Signed)
Pt came for monthly Vit 12 injection.  Pt brought in her own medication,and tolerated injection well.   Pt will schedule next injection in one month.//AB/CMA

## 2014-11-30 ENCOUNTER — Other Ambulatory Visit: Payer: Self-pay | Admitting: Family Medicine

## 2014-12-10 ENCOUNTER — Ambulatory Visit (INDEPENDENT_AMBULATORY_CARE_PROVIDER_SITE_OTHER): Payer: Medicare Other

## 2014-12-10 DIAGNOSIS — E538 Deficiency of other specified B group vitamins: Secondary | ICD-10-CM

## 2014-12-10 MED ORDER — CYANOCOBALAMIN 1000 MCG/ML IJ SOLN
1000.0000 ug | Freq: Once | INTRAMUSCULAR | Status: AC
  Administered 2014-12-10: 1000 ug via INTRAMUSCULAR

## 2014-12-10 NOTE — Progress Notes (Signed)
Pre visit review using our clinic review tool, if applicable. No additional management support is needed unless otherwise documented below in the visit note. 

## 2014-12-10 NOTE — Progress Notes (Signed)
Pt tolerated injection well

## 2014-12-22 ENCOUNTER — Other Ambulatory Visit: Payer: Self-pay | Admitting: Family Medicine

## 2014-12-25 ENCOUNTER — Other Ambulatory Visit: Payer: Self-pay | Admitting: Family Medicine

## 2014-12-26 NOTE — Telephone Encounter (Signed)
Last filled: 12/02/14 Amt: 30, 0 refills Last OV: 03/11/14  Needs OV.    Left a message for call back.

## 2014-12-27 ENCOUNTER — Encounter: Payer: Self-pay | Admitting: Family Medicine

## 2014-12-27 ENCOUNTER — Ambulatory Visit (INDEPENDENT_AMBULATORY_CARE_PROVIDER_SITE_OTHER): Payer: Medicare Other | Admitting: Family Medicine

## 2014-12-27 VITALS — BP 146/86 | HR 74 | Temp 98.4°F | Wt 180.6 lb

## 2014-12-27 DIAGNOSIS — K219 Gastro-esophageal reflux disease without esophagitis: Secondary | ICD-10-CM

## 2014-12-27 DIAGNOSIS — Z23 Encounter for immunization: Secondary | ICD-10-CM

## 2014-12-27 DIAGNOSIS — F32A Depression, unspecified: Secondary | ICD-10-CM

## 2014-12-27 DIAGNOSIS — F418 Other specified anxiety disorders: Secondary | ICD-10-CM

## 2014-12-27 DIAGNOSIS — F329 Major depressive disorder, single episode, unspecified: Secondary | ICD-10-CM

## 2014-12-27 MED ORDER — PANTOPRAZOLE SODIUM 40 MG PO TBEC: 40.0000 mg | DELAYED_RELEASE_TABLET | Freq: Every day | ORAL | Status: DC

## 2014-12-27 MED ORDER — VENLAFAXINE HCL ER 75 MG PO CP24: ORAL_CAPSULE | ORAL | Status: DC

## 2014-12-27 NOTE — Progress Notes (Signed)
Pre visit review using our clinic review tool, if applicable. No additional management support is needed unless otherwise documented below in the visit note. 

## 2014-12-27 NOTE — Progress Notes (Signed)
Subjective:    Patient ID: Stacy Ayers, female    DOB: 1941/01/16, 74 y.o.   MRN: [id number]  HPI  Patient here for f/u depression, gerd.  Past Medical History  Diagnosis Date  . Iron deficiency anemia   . Asthma   . Diabetes mellitus   . Hyperlipidemia   . History of colon polyps     adenomatous  . Hypothyroidism   . Abnormal liver function tests   . Hypertension   . GERD (gastroesophageal reflux disease)   . IBS (irritable bowel syndrome)   . Diverticulosis     Review of Systems  Constitutional: Negative for activity change, appetite change, fatigue and unexpected weight change.  Respiratory: Negative for cough and shortness of breath.   Cardiovascular: Negative for chest pain and palpitations.  Psychiatric/Behavioral: Negative for behavioral problems and dysphoric mood. The patient is not nervous/anxious.        Objective:    Physical Exam  Constitutional: She is oriented to person, place, and time. She appears well-developed and well-nourished. No distress.  HENT:  Right Ear: External ear normal.  Left Ear: External ear normal.  Nose: Nose normal.  Mouth/Throat: Oropharynx is clear and moist.  Eyes: EOM are normal. Pupils are equal, round, and reactive to light.  Neck: Normal range of motion. Neck supple.  Cardiovascular: Normal rate, regular rhythm and normal heart sounds.   No murmur heard. Pulmonary/Chest: Effort normal and breath sounds normal. No respiratory distress. She has no wheezes. She has no rales. She exhibits no tenderness.  Neurological: She is alert and oriented to person, place, and time.  Psychiatric: She has a normal mood and affect. Her behavior is normal. Judgment and thought content normal.    BP 146/86 mmHg  Pulse 74  Temp(Src) 98.4 F (36.9 C) (Oral)  Wt 180 lb 9.6 oz (81.92 kg)  SpO2 98% Wt Readings from Last 3 Encounters:  12/27/14 180 lb 9.6 oz (81.92 kg)  03/11/14 175 lb (79.379 kg)  12/19/13 175 lb 6.4 oz (79.561 kg)      Lab Results  Component Value Date   WBC 5.7 03/11/2014   HGB 12.1 03/11/2014   HCT 35.7* 03/11/2014   PLT 193.0 03/11/2014   GLUCOSE 90 03/11/2014   CHOL 189 07/12/2011   TRIG 122.0 07/12/2011   HDL 57.20 07/12/2011   LDLCALC 107* 07/12/2011   ALT 26 03/11/2014   AST 29 03/11/2014   NA 138 03/11/2014   K 3.9 03/11/2014   CL 104 03/11/2014   CREATININE 0.7 03/11/2014   BUN 17 03/11/2014   CO2 26 03/11/2014   TSH 1.39 03/11/2014   INR 1.0 07/12/2011    Mr Lumbar Spine Wo Contrast  07/22/2013   CLINICAL DATA:  Back pain with radiation to the left hip and buttocks. Patient reports falling last year. No previous relevant surgery.  EXAM: MRI LUMBAR SPINE WITHOUT CONTRAST  TECHNIQUE: Multiplanar, multisequence MR imaging was performed. No intravenous contrast was administered.  COMPARISON:  Lumbar spine radiographs 09/05/2012.  FINDINGS: Radiographs demonstrate 5 lumbar type vertebral bodies. The alignment is stable with a mild convex left scoliosis. There is advanced disc space loss at L4-5 with chronic endplate degeneration. No fracture or pars defect is demonstrated.  The conus medullaris extends to the T12 level and appears normal. No paraspinal abnormalities are identified.  T12-L1: There is a small right paracentral disc protrusion without resulting spinal stenosis or nerve root encroachment.  L1-2:  No significant findings.  L2-3: Mild disc  bulging. No spinal stenosis or nerve root encroachment.  L3-4: There is moderate disc bulging with mild facet and ligamentous hypertrophy. There is mild triangulation of the thecal sac. The lateral recesses and foramina are patent. There is no nerve root encroachment.  L4-5: There is chronic degenerative disc disease with loss of disc height, annular bulging and posterior osteophytes. There is mild facet and ligamentous hypertrophy. There is mild narrowing of the left lateral recess without L5 nerve root encroachment. The foramina are sufficiently  patent.  L5-S1: There is mild disc bulging with left paraspinal osteophyte formation. Although there is no significant foraminal compromise, these osteophytes could encroach on the extra foraminal portion of the left L5 nerve root. There is mild facet and ligamentous hypertrophy. The central canal and lateral recesses are widely patent.  IMPRESSION: 1. No acute findings, significant spinal stenosis or definite nerve root encroachment identified.  2. Chronic paraspinal osteophytes on the left could encroach on the extra foraminal portion of the left L5 nerve root. These are grossly unchanged from prior radiographs.  3. 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  4.  No acute osseous findings or significant malalignment.   Electronically Signed   By: Camie Patience   On: 07/22/2013 14:52       Assessment & Plan:   Problem List Items Addressed This Visit    None    Visit Diagnoses    Depression    -  Primary    Relevant Medications    venlafaxine (EFFEXOR-XR) 24 hr capsule    Gastroesophageal reflux disease without esophagitis        Relevant Medications    pantoprazole (PROTONIX) EC tablet        Garnet Koyanagi, DO

## 2014-12-27 NOTE — Addendum Note (Signed)
Addended by: Leticia Penna A on: 12/27/2014 05:05 PM   Modules accepted: Orders

## 2014-12-27 NOTE — Patient Instructions (Signed)

## 2014-12-27 NOTE — Assessment & Plan Note (Signed)
Refill meds-protonix

## 2014-12-27 NOTE — Assessment & Plan Note (Signed)
con't meds stable 

## 2015-01-14 ENCOUNTER — Ambulatory Visit (INDEPENDENT_AMBULATORY_CARE_PROVIDER_SITE_OTHER): Payer: Medicare Other | Admitting: *Deleted

## 2015-01-14 DIAGNOSIS — E538 Deficiency of other specified B group vitamins: Secondary | ICD-10-CM

## 2015-01-14 MED ORDER — CYANOCOBALAMIN 1000 MCG/ML IJ SOLN
1000.0000 ug | Freq: Once | INTRAMUSCULAR | Status: AC
  Administered 2015-01-14: 1000 ug via INTRAMUSCULAR

## 2015-01-14 NOTE — Progress Notes (Signed)
Pre visit review using our clinic review tool, if applicable. No additional management support is needed unless otherwise documented below in the visit note.  Patient tolerated injection well.  

## 2015-02-04 ENCOUNTER — Other Ambulatory Visit: Payer: Self-pay | Admitting: Family Medicine

## 2015-03-07 ENCOUNTER — Telehealth: Payer: Self-pay | Admitting: *Deleted

## 2015-03-07 ENCOUNTER — Ambulatory Visit (INDEPENDENT_AMBULATORY_CARE_PROVIDER_SITE_OTHER): Payer: Medicare Other | Admitting: *Deleted

## 2015-03-07 DIAGNOSIS — E538 Deficiency of other specified B group vitamins: Secondary | ICD-10-CM

## 2015-03-07 DIAGNOSIS — R5383 Other fatigue: Secondary | ICD-10-CM

## 2015-03-07 DIAGNOSIS — R5381 Other malaise: Secondary | ICD-10-CM

## 2015-03-07 MED ORDER — CYANOCOBALAMIN 1000 MCG/ML IJ SOLN
1000.0000 ug | Freq: Once | INTRAMUSCULAR | Status: AC
  Administered 2015-03-07: 1000 ug via INTRAMUSCULAR

## 2015-03-07 NOTE — Progress Notes (Signed)
Patient requested TSH and B12 rechecked.  [place] with Dr. Etter Sjogren per phone note.  TSH and B12 ordered and lab visit scheduled.

## 2015-03-07 NOTE — Progress Notes (Signed)
Pre visit review using our clinic review tool, if applicable. No additional management support is needed unless otherwise documented below in the visit note.  Patient tolerated injection well.  Next injection scheduled for 04/04/15.   

## 2015-03-07 NOTE — Telephone Encounter (Signed)
Labs ordered and visit scheduled.

## 2015-03-07 NOTE — Addendum Note (Signed)
Addended by: Leticia Penna A on: 03/07/2015 10:51 AM   Modules accepted: Orders

## 2015-03-07 NOTE — Telephone Encounter (Signed)
Patient came in for nurse visit and is requesting labs (TSH and B12) be rechecked.  May she get these drawn at next nurse visit?  Last was 03/11/14 for both.

## 2015-03-07 NOTE — Telephone Encounter (Signed)
That is fine 

## 2015-04-04 ENCOUNTER — Other Ambulatory Visit (INDEPENDENT_AMBULATORY_CARE_PROVIDER_SITE_OTHER): Payer: Medicare Other

## 2015-04-04 ENCOUNTER — Ambulatory Visit (INDEPENDENT_AMBULATORY_CARE_PROVIDER_SITE_OTHER): Payer: Medicare Other | Admitting: *Deleted

## 2015-04-04 DIAGNOSIS — R5383 Other fatigue: Secondary | ICD-10-CM | POA: Diagnosis not present

## 2015-04-04 DIAGNOSIS — E538 Deficiency of other specified B group vitamins: Secondary | ICD-10-CM | POA: Diagnosis not present

## 2015-04-04 DIAGNOSIS — R5381 Other malaise: Secondary | ICD-10-CM

## 2015-04-04 LAB — TSH: TSH: 1.47 u[IU]/mL (ref 0.35–4.50)

## 2015-04-04 LAB — VITAMIN B12: Vitamin B-12: >1500 pg/mL — ABNORMAL HIGH (ref 211–911)

## 2015-04-04 MED ORDER — CYANOCOBALAMIN 1000 MCG/ML IJ SOLN
1000.0000 ug | Freq: Once | INTRAMUSCULAR | Status: AC
  Administered 2015-04-04: 1000 ug via INTRAMUSCULAR

## 2015-04-04 NOTE — Progress Notes (Signed)
Pre visit review using our clinic review tool, if applicable. No additional management support is needed unless otherwise documented below in the visit note.  Patient tolerated injection well.  Escorted patient to lab for appointment.

## 2015-05-06 ENCOUNTER — Ambulatory Visit: Payer: Medicare Other

## 2015-05-08 ENCOUNTER — Ambulatory Visit (INDEPENDENT_AMBULATORY_CARE_PROVIDER_SITE_OTHER): Payer: Medicare Other | Admitting: *Deleted

## 2015-05-08 DIAGNOSIS — E538 Deficiency of other specified B group vitamins: Secondary | ICD-10-CM | POA: Diagnosis not present

## 2015-05-08 MED ORDER — CYANOCOBALAMIN 1000 MCG/ML IJ SOLN
1000.0000 ug | Freq: Once | INTRAMUSCULAR | Status: AC
  Administered 2015-05-08: 1000 ug via INTRAMUSCULAR

## 2015-05-08 NOTE — Progress Notes (Signed)
Pre visit review using our clinic review tool, if applicable. No additional management support is needed unless otherwise documented below in the visit note.  Per lab note:  Notes Recorded by Rosalita Chessman, DO on 04/04/2015 at 8:27 PM Normal--- b12 is high but normally excess is excreted in urine  Patient tolerated injection well.  Next injection scheduled 06/10/15.

## 2015-05-26 ENCOUNTER — Other Ambulatory Visit: Payer: Self-pay

## 2015-06-10 ENCOUNTER — Ambulatory Visit (INDEPENDENT_AMBULATORY_CARE_PROVIDER_SITE_OTHER): Payer: Medicare Other | Admitting: *Deleted

## 2015-06-10 DIAGNOSIS — E538 Deficiency of other specified B group vitamins: Secondary | ICD-10-CM

## 2015-06-10 MED ORDER — CYANOCOBALAMIN 1000 MCG/ML IJ SOLN
1000.0000 ug | Freq: Once | INTRAMUSCULAR | Status: AC
  Administered 2015-06-10: 1000 ug via INTRAMUSCULAR

## 2015-06-10 NOTE — Progress Notes (Signed)
Pre visit review using our clinic review tool, if applicable. No additional management support is needed unless otherwise documented below in the visit note.  Per lab note:     Notes Recorded by Rosalita Chessman, DO on 04/04/2015 at 8:27 PM Normal--- b12 is high but normally excess is excreted in urine       Patient tolerated injection well.  Next injection scheduled 07/08/15.

## 2015-07-03 ENCOUNTER — Other Ambulatory Visit: Payer: Self-pay | Admitting: Family Medicine

## 2015-07-08 ENCOUNTER — Ambulatory Visit (INDEPENDENT_AMBULATORY_CARE_PROVIDER_SITE_OTHER): Payer: Medicare Other

## 2015-07-08 DIAGNOSIS — E538 Deficiency of other specified B group vitamins: Secondary | ICD-10-CM

## 2015-08-07 ENCOUNTER — Telehealth: Payer: Self-pay

## 2015-08-07 NOTE — Telephone Encounter (Signed)
Called to schedule Medicare Wellness Visit with [facility] Coach.  Left a message for call back.  

## 2015-08-08 ENCOUNTER — Ambulatory Visit (INDEPENDENT_AMBULATORY_CARE_PROVIDER_SITE_OTHER): Payer: Medicare Other

## 2015-08-08 VITALS — BP 148/78 | HR 69 | Ht 67.75 in | Wt 171.0 lb

## 2015-08-08 DIAGNOSIS — Z0000 Encounter for general adult medical examination without abnormal findings: Secondary | ICD-10-CM

## 2015-08-08 DIAGNOSIS — Z23 Encounter for immunization: Secondary | ICD-10-CM

## 2015-08-08 DIAGNOSIS — E538 Deficiency of other specified B group vitamins: Secondary | ICD-10-CM

## 2015-08-08 NOTE — Progress Notes (Signed)
Subjective:   Stacy Ayers is a 74 y.o. female who presents for Medicare Annual (Subsequent) preventive examination.  Review of Systems: No ROS  Sleep patterns:  Sleeps at least 7 hours each night. Home Safety/Smoke Alarms:  Feels safe at home.  Lives alone with dog in 2 story townhome.  Looking into moving to [place]. Smoke alarms present.   Firearm Safety: No firearms.   Seat Belt Safety/Bike Helmet:  Always wears seat belt.     Counseling:   Eye Exam- 08/05/15-Dr. [place] regularly. Female:  Pap-UTD      Mammo-Scheduled 08/29/15      Dexa scan- 2-3 years ago     CCS- 01/21/11, repeat in 5 years (12/2015)     Objective:     Vitals: BP 148/78 mmHg  Pulse 69  Ht 5' 7.75" (1.721 m)  Wt 171 lb (77.565 kg)  BMI 26.19 kg/m2  SpO2 97%  Tobacco History  Smoking status  . Former Smoker -- 0.30 packs/day for 2 years  . Types: Cigarettes  . Quit date: 07/11/1962  Smokeless tobacco  . Never Used     Counseling given: Not Answered   Past Medical History  Diagnosis Date  . Iron deficiency anemia   . Asthma   . Diabetes mellitus   . Hyperlipidemia   . History of colon polyps     adenomatous  . Hypothyroidism   . Abnormal liver function tests   . Hypertension   . GERD (gastroesophageal reflux disease)   . IBS (irritable bowel syndrome)   . Diverticulosis    Past Surgical History  Procedure Laterality Date  . Abdominal hysterectomy    . Appendectomy    . Gastric bypass    . Coronary angioplasty    . Cholecystectomy    . Tonsillectomy and adenoidectomy    . Bladder repair    . Abdominal hysterectomy     Family History  Problem Relation Age of Onset  . Diabetes Sister   . Heart disease Maternal Grandmother   . Stroke Mother    History  Sexual Activity  . Sexual Activity: Not Currently    Outpatient Encounter Prescriptions as of 08/08/2015  Medication Sig  . acetaminophen (TYLENOL) 650 MG CR tablet Take 650 mg by mouth every 8 (eight) hours  as needed for pain.  Marland Kitchen albuterol (PROVENTIL HFA) 108 (90 BASE) MCG/ACT inhaler Inhale 2 puffs into the lungs every 6 (six) hours as needed.  . budesonide-formoterol (SYMBICORT) 160-4.5 MCG/ACT inhaler Inhale 2 puffs into the lungs 2 (two) times daily.  . cholecalciferol (VITAMIN D) 1000 UNITS tablet Take 1,000 Units by mouth daily.    Marland Kitchen desloratadine (CLARINEX) 5 MG tablet Take 5 mg by mouth daily.    . fluticasone (FLONASE) 50 MCG/ACT nasal spray USE 2 SPRAYS IN EACH NOSTRIL ONCE DAILY  . Inulin (FIBER CHOICE FRUITY BITES PO) Take 2 tablets by mouth daily.  Marland Kitchen loperamide (IMODIUM) 2 MG capsule Take 2 mg by mouth as needed. 2 in the morning and 2 in the afternoon  . Multiple Vitamins-Minerals (MULTIVITAMIN WITH MINERALS) tablet Take 2 tablets by mouth daily.  . pantoprazole (PROTONIX) 40 MG tablet Take 1 tablet (40 mg total) by mouth daily. Office visit due now  . Probiotic Product (ACIDOPHILUS PROBIOTIC BLEND PO) Take by mouth.    . Simethicone (GAS-X EXTRA STRENGTH) 62.5 MG STRP Take by mouth. As needed   . SYNTHROID 125 MCG tablet TAKE 1 TABLET EACH DAY.  Marland Kitchen venlafaxine  XR (EFFEXOR-XR) 75 MG 24 hr capsule TAKE 1 CAPSULE EVERY DAY.  Marland Kitchen glucose blood (ONE TOUCH ULTRA TEST) test strip Check Blood sugar daily (Patient not taking: Reported on 08/08/2015)  . [DISCONTINUED] acetaminophen (TYLENOL) 325 MG tablet Take 650 mg by mouth every 6 (six) hours as needed.   No facility-administered encounter medications on file as of 08/08/2015.    Activities of Daily Living In your present state of health, do you have any difficulty performing the following activities: 08/08/2015  Hearing? N  Vision? Y  Difficulty concentrating or making decisions? Y  Walking or climbing stairs? Y  Dressing or bathing? N  Doing errands, shopping? N  Preparing Food and eating ? N  Using the Toilet? N  In the past six months, have you accidently leaked urine? Y  Do you have problems with loss of bowel control? Y  Managing  your Medications? N  Managing your Finances? N  Housekeeping or managing your Housekeeping? N    Patient Care Team: Rosalita Chessman, DO as PCP - General Delila Pereyra, MD as Consulting Physician (Gynecology) Amy Martinique, MD as Consulting Physician (Dermatology) Gaynelle Arabian, MD as Consulting Physician (Orthopedic Surgery) Marica Otter, [place] as Consulting Physician (Optometry) Nyra Capes as Consulting Physician (Dentistry)      Assessment:  Hypertension-slightly elevated.  Not currently on medication.  Encouraged low sodium heart healthy diet and exercise.  Will follow up with Dr. Etter Sjogren.   Hyperlipidemia- Last lipid panel 07/12/11. LDL slightly elevated. Encouraged heart healthy diet and exercise and limit saturated fats and simple sugars.    Exercise Activities and Dietary recommendations Current Exercise Habits:: The patient does not participate in regular exercise at present (Has [employer] membership)   Diet- Medi-fast Diet; eats relatively healthy except wine.  Admits to drinking a lot of wine lately.    Goals    . Increase physical activity    . Lose 20 lbs by next year.       Fall Risk Fall Risk  08/08/2015 12/27/2014 12/19/2013 04/18/2013  Falls in the past year? No Yes No Yes  Number falls in past yr: - 2 or more - 2 or more  Risk for fall due to : - - - Impaired balance/gait   Depression Screen PHQ 2/9 Scores 08/08/2015 12/27/2014 12/19/2013  PHQ - 2 Score 0 0 0     Cognitive Testing MMSE - Mini Mental State Exam 08/08/2015  Orientation to time 5  Orientation to Place 5  Registration 3  Attention/ Calculation 5  Recall 3  Language- name 2 objects 2  Language- repeat 1  Language- follow 3 step command 3  Language- read & follow direction 1  Write a sentence 1  Copy design 1  Total score 30    Immunization History  Administered Date(s) Administered  . Influenza Split 09/08/2011, 08/25/2012  . Influenza Whole 08/26/2010  . Influenza,inj,Quad PF,36+ Mos 08/09/2014,  08/08/2015  . Pneumococcal Conjugate-13 12/27/2014  . Pneumococcal Polysaccharide-23 08/05/2010  . Tdap 07/12/2011  . Zoster 07/21/2011   Screening Tests Health Maintenance  Topic Date Due  . HEMOGLOBIN A1C  December 19, 1940  . URINE MICROALBUMIN  05/27/1953  . MAMMOGRAM  07/11/2012  . OPHTHALMOLOGY EXAM  12/25/2015  . INFLUENZA VACCINE  06/29/2016  . FOOT EXAM  08/07/2016  . COLONOSCOPY  07/11/2020  . TETANUS/TDAP  07/11/2021  . DEXA SCAN  Completed  . ZOSTAVAX  Completed  . PNA vac Low Risk Adult  Completed      Plan:  Follow up with Dr. Etter Sjogren as scheduled.  Increase physical activity (walking, water aerobics, dancing, yoga, etc.)  Continue healthy eating.    Schedule colonoscopy and mammogram.   During the course of the visit the patient was educated and counseled about the following appropriate screening and preventive services:   Vaccines to include Pneumoccal, Influenza, Hepatitis B, Td, Zostavax, HCV  Electrocardiogram  Cardiovascular Disease  Colorectal cancer screening  Bone density screening  Diabetes screening  Glaucoma screening  Mammography/PAP  Nutrition counseling   Patient Instructions (the written plan) was given to the patient.   Rudene Anda, RN  08/08/2015

## 2015-08-08 NOTE — Progress Notes (Signed)
Pre visit review using our clinic review tool, if applicable. No additional management support is needed unless otherwise documented below in the visit note. 

## 2015-08-08 NOTE — Patient Instructions (Addendum)
Follow up with Dr. Etter Sjogren as scheduled.  Increase physical activity (walking, water aerobics, dancing, yoga, etc.)  Continue healthy eating.    Schedule colonoscopy and mammogram.     Diabetes and Foot Care Diabetes may cause you to have problems because of poor blood supply (circulation) to your feet and legs. This may cause the skin on your feet to become thinner, break easier, and heal more slowly. Your skin may become dry, and the skin may peel and crack. You may also have nerve damage in your legs and feet causing decreased feeling in them. You may not notice minor injuries to your feet that could lead to infections or more serious problems. Taking care of your feet is one of the most important things you can do for yourself.  HOME CARE INSTRUCTIONS  Wear shoes at all times, even in the house. Do not go barefoot. Bare feet are easily injured.  Check your feet daily for blisters, cuts, and redness. If you cannot see the bottom of your feet, use a mirror or ask someone for help.  Wash your feet with warm water (do not use hot water) and mild soap. Then pat your feet and the areas between your toes until they are completely dry. Do not soak your feet as this can dry your skin.  Apply a moisturizing lotion or petroleum jelly (that does not contain alcohol and is unscented) to the skin on your feet and to dry, brittle toenails. Do not apply lotion between your toes.  Trim your toenails straight across. Do not dig under them or around the cuticle. File the edges of your nails with an emery board or nail file.  Do not cut corns or calluses or try to remove them with medicine.  Wear clean socks or stockings every day. Make sure they are not too tight. Do not wear knee-high stockings since they may decrease blood flow to your legs.  Wear shoes that fit properly and have enough cushioning. To break in new shoes, wear them for just a few hours a day. This prevents you from injuring your feet.  Always look in your shoes before you put them on to be sure there are no objects inside.  Do not cross your legs. This may decrease the blood flow to your feet.  If you find a minor scrape, cut, or break in the skin on your feet, keep it and the skin around it clean and dry. These areas may be cleansed with mild soap and water. Do not cleanse the area with peroxide, alcohol, or iodine.  When you remove an adhesive bandage, be sure not to damage the skin around it.  If you have a wound, look at it several times a day to make sure it is healing.  Do not use heating pads or hot water bottles. They may burn your skin. If you have lost feeling in your feet or legs, you may not know it is happening until it is too late.  Make sure your health care provider performs a complete foot exam at least annually or more often if you have foot problems. Report any cuts, sores, or bruises to your health care provider immediately. SEEK MEDICAL CARE IF:   You have an injury that is not healing.  You have cuts or breaks in the skin.  You have an ingrown nail.  You notice redness on your legs or feet.  You feel burning or tingling in your legs or feet.  You have  pain or cramps in your legs and feet.  Your legs or feet are numb.  Your feet always feel cold. SEEK IMMEDIATE MEDICAL CARE IF:   There is increasing redness, swelling, or pain in or around a wound.  There is a red line that goes up your leg.  Pus is coming from a wound.  You develop a fever or as directed by your health care provider.  You notice a bad smell coming from an ulcer or wound. Document Released: 11/12/2000 Document Revised: 07/18/2013 Document Reviewed: 04/24/2013 [facility] Patient Information 2015 [place], [place]. This information is not intended to replace advice given to you by your health care provider. Make sure you discuss any questions you have with your health care provider.  Screening for Type 2 Diabetes Screening  is a way to check for type 2 diabetes in people who do not have symptoms of the disease, but who may likely develop diabetes in the future. Diabetes can lead to serious health problems, but finding diabetes early allows for early treatment. DIABETES RISK FACTORS   Family history of diabetes.  Diseases of the pancreas.  Obesity or being overweight.  Certain racial or ethnic groups:  American [place].  Pacific Islander.  Hispanic.  Asian.  African American.  High blood pressure (hypertension).  History of diabetes while pregnant (gestational diabetes).  Delivering a baby that weighed over 9 pounds.  Being inactive.  High cholesterol or triglycerides.  Age, especially over 19 years of age. WHO IS SCREENED Adults  Adults who have no risk factors and no symptoms should be screened starting at age 1. If the screening tests are normal, they should be repeated every 3 years.  Adults who do not have symptoms, but are overweight, should be screened before age 66.  Adults who do not have symptoms, but have 1 or more risk factors, should be screened.  Adults who have an A1c (3 month average of blood glucose) greater than 5.7% or who had an impaired glucose tolerance (IGT) or impaired fasting glucose (IFG) on a previous test should be screened.  Pregnant women with or without risk factors should be screened.  Women who gave birth and had gestational diabetes should be screened. This testing should be done 6 to 12 weeks after the child is born. Children or Adolescents  Children and adolescents should be screened for type 2 diabetes if they are overweight and have 2 of the following risk factors:  Having a family history of type 2 diabetes.  Being a member of a high risk race or ethnic group.  Having signs of insulin resistance or conditions associated with insulin resistance.  Having a mother who had gestational diabetes while pregnant with him or her.  Screening should  start at age 36 or at the onset of puberty, whichever comes first. This should be repeated every 2 years. SCREENING In a screening, your caregiver may:  Ask questions about your overall health. This will include questions about the health of close family members, too.  Ask about any diabetes-like symptoms you may have.  Perform a physical exam.  Order some tests that may include:  A fasting plasma glucose test. This measures the level of glucose in your blood. It is done after you have had nothing to eat but water (fasted) for 8 hours.  A random blood glucose test. This test is done without the need to fast.  An oral glucose tolerance test. This is a blood test done in 2 parts. First, a  blood sample is taken after you have fasted. Then, another sample is taken after you drink a liquid that contains a lot of sugar.  An A1c test. This test shows how much glucose has been in your blood over the past 2 to 3 months. Document Released: 09/11/2009 Document Revised: 02/07/2012 Document Reviewed: 06/23/2011 [facility] Patient Information 2015 [place], [place]. This information is not intended to replace advice given to you by your health care provider. Make sure you discuss any questions you have with your health care provider.  Health Maintenance Adopting a healthy lifestyle and getting preventive care can go a long way to promote health and wellness. Talk with your health care provider about what schedule of regular examinations is right for you. This is a good chance for you to check in with your provider about disease prevention and staying healthy. In between checkups, there are plenty of things you can do on your own. Experts have done a lot of research about which lifestyle changes and preventive measures are most likely to keep you healthy. Ask your health care provider for more information. WEIGHT AND DIET  Eat a healthy diet  Be sure to include plenty of vegetables, fruits, low-fat dairy  products, and lean protein.  Do not eat a lot of foods high in solid fats, added sugars, or salt.  Get regular exercise. This is one of the most important things you can do for your health.  Most adults should exercise for at least 150 minutes each week. The exercise should increase your heart rate and make you sweat (moderate-intensity exercise).  Most adults should also do strengthening exercises at least twice a week. This is in addition to the moderate-intensity exercise.  Maintain a healthy weight  Body mass index (BMI) is a measurement that can be used to identify possible weight problems. It estimates body fat based on height and weight. Your health care provider can help determine your BMI and help you achieve or maintain a healthy weight.  For females 22 years of age and older:   A BMI below 18.5 is considered underweight.  A BMI of 18.5 to 24.9 is normal.  A BMI of 25 to 29.9 is considered overweight.  A BMI of 30 and above is considered obese.  Watch levels of cholesterol and blood lipids  You should start having your blood tested for lipids and cholesterol at 72 years of age, then have this test every 5 years.  You may need to have your cholesterol levels checked more often if:  Your lipid or cholesterol levels are high.  You are older than 72 years of age.  You are at high risk for heart disease.  CANCER SCREENING   Lung Cancer  Lung cancer screening is recommended for adults 67-57 years old who are at high risk for lung cancer because of a history of smoking.  A yearly low-dose CT scan of the lungs is recommended for people who:  Currently smoke.  Have quit within the past 15 years.  Have at least a 30-pack-year history of smoking. A pack year is smoking an average of one pack of cigarettes a day for 1 year.  Yearly screening should continue until it has been 15 years since you quit.  Yearly screening should stop if you develop a health problem that  would prevent you from having lung cancer treatment.  Breast Cancer  Practice breast self-awareness. This means understanding how your breasts normally appear and feel.  It also means doing  regular breast self-exams. Let your health care provider know about any changes, no matter how small.  If you are in your 20s or 30s, you should have a clinical breast exam (CBE) by a health care provider every 1-3 years as part of a regular health exam.  If you are 25 or older, have a CBE every year. Also consider having a breast X-ray (mammogram) every year.  If you have a family history of breast cancer, talk to your health care provider about genetic screening.  If you are at high risk for breast cancer, talk to your health care provider about having an MRI and a mammogram every year.  Breast cancer gene (BRCA) assessment is recommended for women who have family members with BRCA-related cancers. BRCA-related cancers include:  Breast.  Ovarian.  Tubal.  Peritoneal cancers.  Results of the assessment will determine the need for genetic counseling and BRCA1 and BRCA2 testing. Cervical Cancer Routine pelvic examinations to screen for cervical cancer are no longer recommended for nonpregnant women who are considered low risk for cancer of the pelvic organs (ovaries, uterus, and vagina) and who do not have symptoms. A pelvic examination may be necessary if you have symptoms including those associated with pelvic infections. Ask your health care provider if a screening pelvic exam is right for you.   The Pap test is the screening test for cervical cancer for women who are considered at risk.  If you had a hysterectomy for a problem that was not cancer or a condition that could lead to cancer, then you no longer need Pap tests.  If you are older than 65 years, and you have had normal Pap tests for the past 10 years, you no longer need to have Pap tests.  If you have had past treatment for cervical  cancer or a condition that could lead to cancer, you need Pap tests and screening for cancer for at least 20 years after your treatment.  If you no longer get a Pap test, assess your risk factors if they change (such as having a new sexual partner). This can affect whether you should start being screened again.  Some women have medical problems that increase their chance of getting cervical cancer. If this is the case for you, your health care provider may recommend more frequent screening and Pap tests.  The human papillomavirus (HPV) test is another test that may be used for cervical cancer screening. The HPV test looks for the virus that can cause cell changes in the cervix. The cells collected during the Pap test can be tested for HPV.  The HPV test can be used to screen women 58 years of age and older. Getting tested for HPV can extend the interval between normal Pap tests from three to five years.  An HPV test also should be used to screen women of any age who have unclear Pap test results.  After 72 years of age, women should have HPV testing as often as Pap tests.  Colorectal Cancer  This type of cancer can be detected and often prevented.  Routine colorectal cancer screening usually begins at 72 years of age and continues through 72 years of age.  Your health care provider may recommend screening at an earlier age if you have risk factors for colon cancer.  Your health care provider may also recommend using home test kits to check for hidden blood in the stool.  A small camera at the end of a tube can  be used to examine your colon directly (sigmoidoscopy or colonoscopy). This is done to check for the earliest forms of colorectal cancer.  Routine screening usually begins at age 64.  Direct examination of the colon should be repeated every 5-10 years through 72 years of age. However, you may need to be screened more often if early forms of precancerous polyps or small growths are  found. Skin Cancer  Check your skin from head to toe regularly.  Tell your health care provider about any new moles or changes in moles, especially if there is a change in a mole's shape or color.  Also tell your health care provider if you have a mole that is larger than the size of a pencil eraser.  Always use sunscreen. Apply sunscreen liberally and repeatedly throughout the day.  Protect yourself by wearing long sleeves, pants, a wide-brimmed hat, and sunglasses whenever you are outside. HEART DISEASE, DIABETES, AND HIGH BLOOD PRESSURE   Have your blood pressure checked at least every 1-2 years. High blood pressure causes heart disease and increases the risk of stroke.  If you are between 31 years and 68 years old, ask your health care provider if you should take aspirin to prevent strokes.  Have regular diabetes screenings. This involves taking a blood sample to check your fasting blood sugar level.  If you are at a normal weight and have a low risk for diabetes, have this test once every three years after 72 years of age.  If you are overweight and have a high risk for diabetes, consider being tested at a younger age or more often. PREVENTING INFECTION  Hepatitis B  If you have a higher risk for hepatitis B, you should be screened for this virus. You are considered at high risk for hepatitis B if:  You were born in a country where hepatitis B is common. Ask your health care provider which countries are considered high risk.  Your parents were born in a high-risk country, and you have not been immunized against hepatitis B (hepatitis B vaccine).  You have HIV or AIDS.  You use needles to inject street drugs.  You live with someone who has hepatitis B.  You have had sex with someone who has hepatitis B.  You get hemodialysis treatment.  You take certain medicines for conditions, including cancer, organ transplantation, and autoimmune conditions. Hepatitis C  Blood  testing is recommended for:  Everyone born from [birth year] through 1963.  Anyone with known risk factors for hepatitis C. Sexually transmitted infections (STIs)  You should be screened for sexually transmitted infections (STIs) including gonorrhea and chlamydia if:  You are sexually active and are younger than 72 years of age.  You are older than 72 years of age and your health care provider tells you that you are at risk for this type of infection.  Your sexual activity has changed since you were last screened and you are at an increased risk for chlamydia or gonorrhea. Ask your health care provider if you are at risk.  If you do not have HIV, but are at risk, it may be recommended that you take a prescription medicine daily to prevent HIV infection. This is called pre-exposure prophylaxis (PrEP). You are considered at risk if:  You are sexually active and do not regularly use condoms or know the HIV status of your partner(s).  You take drugs by injection.  You are sexually active with a partner who has HIV. Talk with your health  care provider about whether you are at high risk of being infected with HIV. If you choose to begin PrEP, you should first be tested for HIV. You should then be tested every 3 months for as long as you are taking PrEP.  PREGNANCY   If you are premenopausal and you may become pregnant, ask your health care provider about preconception counseling.  If you may become pregnant, take 400 to 800 micrograms (mcg) of folic acid every day.  If you want to prevent pregnancy, talk to your health care provider about birth control (contraception). OSTEOPOROSIS AND MENOPAUSE   Osteoporosis is a disease in which the bones lose minerals and strength with aging. This can result in serious bone fractures. Your risk for osteoporosis can be identified using a bone density scan.  If you are 37 years of age or older, or if you are at risk for osteoporosis and fractures, ask your  health care provider if you should be screened.  Ask your health care provider whether you should take a calcium or vitamin D supplement to lower your risk for osteoporosis.  Menopause may have certain physical symptoms and risks.  Hormone replacement therapy may reduce some of these symptoms and risks. Talk to your health care provider about whether hormone replacement therapy is right for you.  HOME CARE INSTRUCTIONS   Schedule regular health, dental, and eye exams.  Stay current with your immunizations.   Do not use any tobacco products including cigarettes, chewing tobacco, or electronic cigarettes.  If you are pregnant, do not drink alcohol.  If you are breastfeeding, limit how much and how often you drink alcohol.  Limit alcohol intake to no more than 1 drink per day for nonpregnant women. One drink equals 12 ounces of beer, 5 ounces of wine, or 1 ounces of hard liquor.  Do not use street drugs.  Do not share needles.  Ask your health care provider for help if you need support or information about quitting drugs.  Tell your health care provider if you often feel depressed.  Tell your health care provider if you have ever been abused or do not feel safe at home. Document Released: 05/31/2011 Document Revised: 04/01/2014 Document Reviewed: 10/17/2013 [facility] Patient Information 2015 [place], [place]. This information is not intended to replace advice given to you by your health care provider. Make sure you discuss any questions you have with your health care provider.

## 2015-08-08 NOTE — Telephone Encounter (Signed)
Appointment scheduled.

## 2015-08-11 ENCOUNTER — Encounter: Payer: Self-pay | Admitting: Family Medicine

## 2015-08-11 ENCOUNTER — Ambulatory Visit (INDEPENDENT_AMBULATORY_CARE_PROVIDER_SITE_OTHER): Payer: Medicare Other | Admitting: Family Medicine

## 2015-08-11 VITALS — BP 136/71 | HR 78 | Temp 99.2°F | Ht 68.0 in | Wt 169.6 lb

## 2015-08-11 DIAGNOSIS — F1010 Alcohol abuse, uncomplicated: Secondary | ICD-10-CM | POA: Diagnosis not present

## 2015-08-11 DIAGNOSIS — G5600 Carpal tunnel syndrome, unspecified upper limb: Secondary | ICD-10-CM | POA: Insufficient documentation

## 2015-08-11 DIAGNOSIS — G5602 Carpal tunnel syndrome, left upper limb: Secondary | ICD-10-CM | POA: Diagnosis not present

## 2015-08-11 DIAGNOSIS — J302 Other seasonal allergic rhinitis: Secondary | ICD-10-CM | POA: Diagnosis not present

## 2015-08-11 DIAGNOSIS — F1020 Alcohol dependence, uncomplicated: Secondary | ICD-10-CM | POA: Diagnosis not present

## 2015-08-11 MED ORDER — LORATADINE 10 MG PO TABS: 10.0000 mg | ORAL_TABLET | Freq: Every day | ORAL | Status: DC

## 2015-08-11 NOTE — Assessment & Plan Note (Signed)
flonase otc anthistamine

## 2015-08-11 NOTE — Assessment & Plan Note (Signed)
Referred to psych Pt given name and number of psych that does substance abuse Pt is not at risk of Dts She is not depressed/ suicidal etc.  Call or rto prn

## 2015-08-11 NOTE — Progress Notes (Signed)
Pre visit review using our clinic review tool, if applicable. No additional management support is needed unless otherwise documented below in the visit note. 

## 2015-08-11 NOTE — Patient Instructions (Signed)
Alcohol Use Disorder Alcohol use disorder is a mental disorder. It is not a one-time incident of heavy drinking. Alcohol use disorder is the excessive and uncontrollable use of alcohol over time that leads to problems with functioning in one or more areas of daily living. People with this disorder risk harming themselves and others when they drink to excess. Alcohol use disorder also can cause other mental disorders, such as mood and anxiety disorders, and serious physical problems. People with alcohol use disorder often misuse other drugs.  Alcohol use disorder is common and widespread. Some people with this disorder drink alcohol to cope with or escape from negative life events. Others drink to relieve chronic pain or symptoms of mental illness. People with a family history of alcohol use disorder are at higher risk of losing control and using alcohol to excess.  SYMPTOMS  Signs and symptoms of alcohol use disorder may include the following:   Consumption ofalcohol inlarger amounts or over a longer period of time than intended.  Multiple unsuccessful attempts to cutdown or control alcohol use.   A great deal of time spent obtaining alcohol, using alcohol, or recovering from the effects of alcohol (hangover).  A strong desire or urge to use alcohol (cravings).   Continued use of alcohol despite problems at work, school, or home because of alcohol use.   Continued use of alcohol despite problems in relationships because of alcohol use.  Continued use of alcohol in situations when it is physically hazardous, such as driving a car.  Continued use of alcohol despite awareness of a physical or psychological problem that is likely related to alcohol use. Physical problems related to alcohol use can involve the brain, heart, liver, stomach, and intestines. Psychological problems related to alcohol use include intoxication, depression, anxiety, psychosis, delirium, and dementia.   The need for  increased amounts of alcohol to achieve the same desired effect, or a decreased effect from the consumption of the same amount of alcohol (tolerance).  Withdrawal symptoms upon reducing or stopping alcohol use, or alcohol use to reduce or avoid withdrawal symptoms. Withdrawal symptoms include:  Racing heart.  Hand tremor.  Difficulty sleeping.  Nausea.  Vomiting.  Hallucinations.  Restlessness.  Seizures. DIAGNOSIS Alcohol use disorder is diagnosed through an assessment by your health care provider. Your health care provider may start by asking three or four questions to screen for excessive or problematic alcohol use. To confirm a diagnosis of alcohol use disorder, at least two symptoms must be present within a 12-month period. The severity of alcohol use disorder depends on the number of symptoms:  Mild--two or three.  Moderate--four or five.  Severe--six or more. Your health care provider may perform a physical exam or use results from lab tests to see if you have physical problems resulting from alcohol use. Your health care provider may refer you to a mental health professional for evaluation. TREATMENT  Some people with alcohol use disorder are able to reduce their alcohol use to low-risk levels. Some people with alcohol use disorder need to quit drinking alcohol. When necessary, mental health professionals with specialized training in substance use treatment can help. Your health care provider can help you decide how severe your alcohol use disorder is and what type of treatment you need. The following forms of treatment are available:   Detoxification. Detoxification involves the use of prescription medicines to prevent alcohol withdrawal symptoms in the first week after quitting. This is important for people with a history of symptoms   of withdrawal and for heavy drinkers who are likely to have withdrawal symptoms. Alcohol withdrawal can be dangerous and, in severe cases, cause  death. Detoxification is usually provided in a hospital or in-patient substance use treatment facility.  Counseling or talk therapy. Talk therapy is provided by substance use treatment counselors. It addresses the reasons people use alcohol and ways to keep them from drinking again. The goals of talk therapy are to help people with alcohol use disorder find healthy activities and ways to cope with life stress, to identify and avoid triggers for alcohol use, and to handle cravings, which can cause relapse.  Medicines.Different medicines can help treat alcohol use disorder through the following actions:  Decrease alcohol cravings.  Decrease the positive reward response felt from alcohol use.  Produce an uncomfortable physical reaction when alcohol is used (aversion therapy).  Support groups. Support groups are run by people who have quit drinking. They provide emotional support, advice, and guidance. These forms of treatment are often combined. Some people with alcohol use disorder benefit from intensive combination treatment provided by specialized substance use treatment centers. Both inpatient and outpatient treatment programs are available. Document Released: 12/23/2004 Document Revised: 04/01/2014 Document Reviewed: 02/22/2013 Exit[employer] Patient Information 2015 [employer]. This information is not intended to replace advice given to you by your health care provider. Make sure you discuss any questions you have with your health care provider.  

## 2015-08-11 NOTE — Assessment & Plan Note (Signed)
Wrist splint If symptoms do not improve--- hand surgeon

## 2015-08-11 NOTE — Progress Notes (Signed)
Patient ID: Stacy Ayers, female    DOB: [birth year]  Age: 74 y.o. MRN: [id number]    Subjective:  Subjective HPI Stacy Ayers presents for body aches and numbness in L hand.  X several months.  Symptoms occurring more frequently.   She has trouble holding her choir folder and will need to fling wrist to get feeling back.  achiness in both shoulders.  No fevers but she feels like she is coming down with something.  Pain comes and goes.    At the end of the visit pt brought up her drinking and feeling like she needs help --- since her bypass surgery she states it is so much easier to drink more.  She is asking for [place] to find her help.  She can go days without drinking but when she does drink she feels she has too much.   Review of Systems  Constitutional: Negative for diaphoresis, appetite change, fatigue and unexpected weight change.  Eyes: Negative for pain, redness and visual disturbance.  Respiratory: Negative for cough, chest tightness, shortness of breath and wheezing.   Cardiovascular: Negative for chest pain, palpitations and leg swelling.  Endocrine: Negative for cold intolerance, heat intolerance, polydipsia, polyphagia and polyuria.  Genitourinary: Negative for dysuria, frequency and difficulty urinating.  Musculoskeletal: Positive for arthralgias. Negative for joint swelling and gait problem.  Neurological: Positive for numbness. Negative for dizziness, tremors, weakness, light-headedness and headaches.  Psychiatric/Behavioral: Negative for suicidal ideas, sleep disturbance, dysphoric mood and decreased concentration. The patient is not nervous/anxious.     History Past Medical History  Diagnosis Date  . Iron deficiency anemia   . Asthma   . Diabetes mellitus   . Hyperlipidemia   . History of colon polyps     adenomatous  . Hypothyroidism   . Abnormal liver function tests   . Hypertension   . GERD (gastroesophageal reflux disease)   . IBS (irritable bowel syndrome)   .  Diverticulosis     She has past surgical history that includes Abdominal hysterectomy; Appendectomy; Gastric bypass; Coronary angioplasty; Cholecystectomy; Tonsillectomy and adenoidectomy; Bladder repair; and Abdominal hysterectomy.   Her family history includes Diabetes in her sister; Heart disease in her maternal grandmother; Stroke in her mother.She reports that she quit smoking about 53 years ago. Her smoking use included Cigarettes. She has a .6 pack-year smoking history. She has never used smokeless tobacco. She reports that she drinks about 8.4 oz of alcohol per week. She reports that she does not use illicit drugs.  Current Outpatient Prescriptions on File Prior to Visit  Medication Sig Dispense Refill  . acetaminophen (TYLENOL) 650 MG CR tablet Take 650 mg by mouth every 8 (eight) hours as needed for pain.    Marland Kitchen albuterol (PROVENTIL HFA) 108 (90 BASE) MCG/ACT inhaler Inhale 2 puffs into the lungs every 6 (six) hours as needed. 1 Inhaler 2  . budesonide-formoterol (SYMBICORT) 160-4.5 MCG/ACT inhaler Inhale 2 puffs into the lungs 2 (two) times daily. 1 Inhaler 5  . cholecalciferol (VITAMIN D) 1000 UNITS tablet Take 1,000 Units by mouth daily.      . fluticasone (FLONASE) 50 MCG/ACT nasal spray USE 2 SPRAYS IN EACH NOSTRIL ONCE DAILY 16 g 2  . Inulin (FIBER CHOICE FRUITY BITES PO) Take 2 tablets by mouth daily.    Marland Kitchen loperamide (IMODIUM) 2 MG capsule Take 2 mg by mouth as needed. 2 in the morning and 2 in the afternoon    . Multiple Vitamins-Minerals (MULTIVITAMIN WITH MINERALS) tablet  Take 2 tablets by mouth daily.    . pantoprazole (PROTONIX) 40 MG tablet Take 1 tablet (40 mg total) by mouth daily. Office visit due now 90 tablet 3  . Probiotic Product (ACIDOPHILUS PROBIOTIC BLEND PO) Take by mouth.      . Simethicone (GAS-X EXTRA STRENGTH) 62.5 MG STRP Take by mouth. As needed     . SYNTHROID 125 MCG tablet TAKE 1 TABLET EACH DAY. 90 tablet 3  . venlafaxine XR (EFFEXOR-XR) 75 MG 24 hr  capsule TAKE 1 CAPSULE EVERY DAY. 90 capsule 3   No current facility-administered medications on file prior to visit.     Objective:  Objective Physical Exam  Constitutional: She is oriented to person, place, and time. She appears well-developed and well-nourished.  HENT:  Head: Normocephalic and atraumatic.  Eyes: Conjunctivae and EOM are normal.  Neck: Normal range of motion. Neck supple. No JVD present. Carotid bruit is not present. No thyromegaly present.  Cardiovascular: Normal rate, regular rhythm and normal heart sounds.   No murmur heard. Pulmonary/Chest: Effort normal and breath sounds normal. No respiratory distress. She has no wheezes. She has no rales. She exhibits no tenderness.  Musculoskeletal: She exhibits no edema.  Neurological: She is alert and oriented to person, place, and time. She has normal strength and normal reflexes. No sensory deficit. Gait normal.  Numbness in L hand-- all fingers--   Psychiatric: She has a normal mood and affect. Her behavior is normal. Thought content normal.   BP 136/71 mmHg  Pulse 78  Temp(Src) 99.2 F (37.3 C) (Oral)  Ht 5\' 8"  (1.727 m)  Wt 169 lb 9.6 oz (76.93 kg)  BMI 25.79 kg/m2  SpO2 98% Wt Readings from Last 3 Encounters:  08/11/15 169 lb 9.6 oz (76.93 kg)  08/08/15 171 lb (77.565 kg)  12/27/14 180 lb 9.6 oz (81.92 kg)     Lab Results  Component Value Date   WBC 5.7 03/11/2014   HGB 12.1 03/11/2014   HCT 35.7* 03/11/2014   PLT 193.0 03/11/2014   GLUCOSE 90 03/11/2014   CHOL 189 07/12/2011   TRIG 122.0 07/12/2011   HDL 57.20 07/12/2011   LDLCALC 107* 07/12/2011   ALT 26 03/11/2014   AST 29 03/11/2014   NA 138 03/11/2014   K 3.9 03/11/2014   CL 104 03/11/2014   CREATININE 0.7 03/11/2014   BUN 17 03/11/2014   CO2 26 03/11/2014   TSH 1.47 04/04/2015   INR 1.0 07/12/2011    Mr Lumbar Spine Wo Contrast  07/22/2013   CLINICAL DATA:  Back pain with radiation to the left hip and buttocks. Patient reports  falling last year. No previous relevant surgery.  EXAM: MRI LUMBAR SPINE WITHOUT CONTRAST  TECHNIQUE: Multiplanar, multisequence MR imaging was performed. No intravenous contrast was administered.  COMPARISON:  Lumbar spine radiographs 09/05/2012.  FINDINGS: Radiographs demonstrate 5 lumbar type vertebral bodies. The alignment is stable with a mild convex left scoliosis. There is advanced disc space loss at L4-5 with chronic endplate degeneration. No fracture or pars defect is demonstrated.  The conus medullaris extends to the T12 level and appears normal. No paraspinal abnormalities are identified.  T12-L1: There is a small right paracentral disc protrusion without resulting spinal stenosis or nerve root encroachment.  L1-2:  No significant findings.  L2-3: Mild disc bulging. No spinal stenosis or nerve root encroachment.  L3-4: There is moderate disc bulging with mild facet and ligamentous hypertrophy. There is mild triangulation of the thecal sac. The lateral  recesses and foramina are patent. There is no nerve root encroachment.  L4-5: There is chronic degenerative disc disease with loss of disc height, annular bulging and posterior osteophytes. There is mild facet and ligamentous hypertrophy. There is mild narrowing of the left lateral recess without L5 nerve root encroachment. The foramina are sufficiently patent.  L5-S1: There is mild disc bulging with left paraspinal osteophyte formation. Although there is no significant foraminal compromise, these osteophytes could encroach on the extra foraminal portion of the left L5 nerve root. There is mild facet and ligamentous hypertrophy. The central canal and lateral recesses are widely patent.  IMPRESSION: 1. No acute findings, significant spinal stenosis or definite nerve root encroachment identified.  2. Chronic paraspinal osteophytes on the left could encroach on the extra foraminal portion of the left L5 nerve root. These are grossly unchanged from prior  radiographs.  3. 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  4.  No acute osseous findings or significant malalignment.   Electronically Signed   By: Camie Patience   On: 07/22/2013 14:52     Assessment & Plan:  Plan I have discontinued Ms. Pilson's desloratadine and glucose blood. I am also having her start on loratadine. Additionally, I am having her maintain her Simethicone, loperamide, cholecalciferol, Probiotic Product (ACIDOPHILUS PROBIOTIC BLEND PO), budesonide-formoterol, albuterol, venlafaxine XR, pantoprazole, fluticasone, SYNTHROID, acetaminophen, multivitamin with minerals, Inulin (FIBER CHOICE FRUITY BITES PO), and GRAPE SEED EXTRACT PO.  Meds ordered this encounter  Medications  . GRAPE SEED EXTRACT PO    Sig: Take 1 tablet by mouth daily.  Marland Kitchen loratadine (CLARITIN) 10 MG tablet    Sig: Take 1 tablet (10 mg total) by mouth daily.    Dispense:  30 tablet    Refill:  11    Problem List Items Addressed This Visit    None    Visit Diagnoses    Seasonal allergies    -  Primary    Relevant Medications    loratadine (CLARITIN) 10 MG tablet    Alcohol abuse        Relevant Orders    Ambulatory referral to Psychiatry    Carpal tunnel syndrome, left           Follow-up: Return if symptoms worsen or fail to improve.  Garnet Koyanagi, DO

## 2015-08-11 NOTE — Progress Notes (Signed)
Fitted and applied left wrist splint.  Provided information for Dr. Reddy's office per physician request.

## 2015-08-14 ENCOUNTER — Telehealth: Payer: Self-pay | Admitting: Family Medicine

## 2015-08-14 NOTE — Telephone Encounter (Signed)
Relation to QT:TCNG Call back number: [phone]   Reason for call:  Patient states Dr. Reece Levy is not accepting new patients and would like PCP to refer another physician

## 2015-08-15 NOTE — Telephone Encounter (Signed)
Spoke with patient and I gave her the numbers to [facility], [facility], [facility] and [facility]. She said she will try to get in with someone and thanked me for the call.     KP

## 2015-08-24 ENCOUNTER — Other Ambulatory Visit: Payer: Self-pay | Admitting: Family Medicine

## 2015-08-27 NOTE — Progress Notes (Signed)
reviewed

## 2015-09-04 ENCOUNTER — Telehealth: Payer: Self-pay | Admitting: Family Medicine

## 2015-09-04 ENCOUNTER — Other Ambulatory Visit: Payer: Self-pay | Admitting: Gynecology

## 2015-09-04 DIAGNOSIS — R928 Other abnormal and inconclusive findings on diagnostic imaging of breast: Secondary | ICD-10-CM

## 2015-09-04 NOTE — Telephone Encounter (Signed)
Patient wants the doctor to know that she is trying to get into [facility] and they are going to call the doctor for a physical report and she wants to give the doctor a heads up on it. The patient also want you to know that she has stopped drinking all alcohol on Aug 15, 2015.

## 2015-09-04 NOTE — Telephone Encounter (Signed)
Great!

## 2015-09-11 ENCOUNTER — Ambulatory Visit
Admission: RE | Admit: 2015-09-11 | Discharge: 2015-09-11 | Disposition: A | Discharge status: Home / Self Care | Payer: Medicare Other | Source: Ambulatory Visit | Attending: Gynecology | Admitting: Gynecology

## 2015-09-11 ENCOUNTER — Other Ambulatory Visit: Payer: Self-pay | Admitting: Gynecology

## 2015-09-11 DIAGNOSIS — R928 Other abnormal and inconclusive findings on diagnostic imaging of breast: Secondary | ICD-10-CM

## 2015-09-15 ENCOUNTER — Ambulatory Visit
Admission: RE | Admit: 2015-09-15 | Discharge: 2015-09-15 | Disposition: A | Discharge status: Home / Self Care | Payer: Medicare Other | Source: Ambulatory Visit | Attending: Gynecology | Admitting: Gynecology

## 2015-09-15 ENCOUNTER — Other Ambulatory Visit: Payer: Self-pay | Admitting: Gynecology

## 2015-09-15 DIAGNOSIS — R928 Other abnormal and inconclusive findings on diagnostic imaging of breast: Secondary | ICD-10-CM

## 2015-09-22 NOTE — Telephone Encounter (Signed)
Caller name: Stacy Ayers   Relationship to patient: Self   Can be reached: [phone]  Reason for call: pt called in to check the status of her [facility] forms for retirement home. She says that they would be coming directly from them. She would like to have them filled out and faxed back as soon as possible.    Please advise receipt of forms.

## 2015-09-22 NOTE — Telephone Encounter (Signed)
Not received yet.

## 2015-10-07 ENCOUNTER — Ambulatory Visit (INDEPENDENT_AMBULATORY_CARE_PROVIDER_SITE_OTHER): Payer: Medicare Other

## 2015-10-07 DIAGNOSIS — E538 Deficiency of other specified B group vitamins: Secondary | ICD-10-CM | POA: Diagnosis not present

## 2015-10-07 MED ORDER — CYANOCOBALAMIN 1000 MCG/ML IJ SOLN
1000.0000 ug | Freq: Once | INTRAMUSCULAR | Status: AC
  Administered 2015-10-07: 1000 ug via INTRAMUSCULAR

## 2015-10-07 NOTE — Progress Notes (Signed)
Pre visit review using our clinic review tool, if applicable. No additional management support is needed unless otherwise documented below in the visit note.  Patient in for B12 injection 

## 2015-10-16 ENCOUNTER — Telehealth: Payer: Self-pay | Admitting: Family Medicine

## 2015-10-16 NOTE — Telephone Encounter (Signed)
Caller name: Judson Roch @ [facility] Ph# [phone]  Reason for call: Judson Roch stating that she faxed documents yesterday but forgot a page and faxed that page in this morning. She is wanting to make sure that documents were received. It was a total of 8 pages yesterday (including cover page) and 2 pages today (including cover page). Please call and notify that it was received. These are medical assessment for the patient to be moved into the [employer].

## 2015-10-16 NOTE — Telephone Encounter (Signed)
Received all forms. Returned [employer] call and could not reach her. Forms forwarded to Dr. Etter Sjogren. JG/CMA

## 2015-10-17 NOTE — Telephone Encounter (Signed)
Forms completed and faxed back. Sent for scanning. JG//CMA

## 2015-11-07 ENCOUNTER — Ambulatory Visit (INDEPENDENT_AMBULATORY_CARE_PROVIDER_SITE_OTHER): Payer: Medicare Other | Admitting: Behavioral Health

## 2015-11-07 DIAGNOSIS — E538 Deficiency of other specified B group vitamins: Secondary | ICD-10-CM

## 2015-11-07 MED ORDER — CYANOCOBALAMIN 1000 MCG/ML IJ SOLN
1000.0000 ug | Freq: Once | INTRAMUSCULAR | Status: AC
  Administered 2015-11-07: 1000 ug via INTRAMUSCULAR

## 2015-11-07 NOTE — Progress Notes (Signed)
Pre visit review using our clinic review tool, if applicable. No additional management support is needed unless otherwise documented below in the visit note.  Patient tolerated injection well. Next appointment scheduled for 12/10/15 at 9:00 AM.

## 2015-11-18 ENCOUNTER — Other Ambulatory Visit: Payer: Self-pay | Admitting: Family Medicine

## 2015-12-10 ENCOUNTER — Ambulatory Visit (INDEPENDENT_AMBULATORY_CARE_PROVIDER_SITE_OTHER): Payer: Medicare Other | Admitting: Behavioral Health

## 2015-12-10 DIAGNOSIS — E538 Deficiency of other specified B group vitamins: Secondary | ICD-10-CM | POA: Diagnosis not present

## 2015-12-10 MED ORDER — CYANOCOBALAMIN 1000 MCG/ML IJ SOLN
1000.0000 ug | Freq: Once | INTRAMUSCULAR | Status: AC
  Administered 2015-12-10: 1000 ug via INTRAMUSCULAR

## 2015-12-10 NOTE — Progress Notes (Signed)
Pre visit review using our clinic review tool, if applicable. No additional management support is needed unless otherwise documented below in the visit note.  Patient tolerated injection well.  Next injection scheduled for 01/13/16 at 9:00 AM.

## 2016-01-13 ENCOUNTER — Ambulatory Visit (INDEPENDENT_AMBULATORY_CARE_PROVIDER_SITE_OTHER): Payer: Medicare Other | Admitting: *Deleted

## 2016-01-13 DIAGNOSIS — E538 Deficiency of other specified B group vitamins: Secondary | ICD-10-CM | POA: Diagnosis not present

## 2016-01-13 MED ORDER — CYANOCOBALAMIN 1000 MCG/ML IJ SOLN
1000.0000 ug | Freq: Once | INTRAMUSCULAR | Status: AC
  Administered 2016-01-13: 1000 ug via INTRAMUSCULAR

## 2016-01-13 NOTE — Progress Notes (Signed)
Pre visit review using our clinic review tool, if applicable. No additional management support is needed unless otherwise documented below in the visit note.  Pt tolerated injection well.   Next appt: 02/10/16

## 2016-01-22 ENCOUNTER — Ambulatory Visit (INDEPENDENT_AMBULATORY_CARE_PROVIDER_SITE_OTHER): Payer: Medicare Other | Admitting: Family Medicine

## 2016-01-22 ENCOUNTER — Encounter: Payer: Self-pay | Admitting: Family Medicine

## 2016-01-22 VITALS — BP 118/72 | HR 85 | Temp 98.9°F | Wt 179.0 lb

## 2016-01-22 DIAGNOSIS — J0140 Acute pansinusitis, unspecified: Secondary | ICD-10-CM | POA: Diagnosis not present

## 2016-01-22 MED ORDER — METHYLPREDNISOLONE ACETATE 80 MG/ML IJ SUSP
80.0000 mg | Freq: Once | INTRAMUSCULAR | Status: AC
  Administered 2016-01-22: 80 mg via INTRAMUSCULAR

## 2016-01-22 MED ORDER — CLARITHROMYCIN ER 500 MG PO TB24: 1000.0000 mg | ORAL_TABLET | Freq: Every day | ORAL | Status: AC

## 2016-01-22 NOTE — Progress Notes (Signed)
Pre visit review using our clinic review tool, if applicable. No additional management support is needed unless otherwise documented below in the visit note. 

## 2016-01-22 NOTE — Progress Notes (Signed)
Subjective:     Stacy Ayers is a 75 y.o. female who presents for evaluation of sinus pain. Symptoms include: cough, headaches, nasal congestion, post nasal drip, purulent rhinorrhea, sinus pressure, sore throat and wheezing. Onset of symptoms was 1 week ago. Symptoms have been gradually worsening since that time. Past history is significant for no history of pneumonia or bronchitis. Patient is a non-smoker.  Pt went to urgent care on Sunday and was given a nasal spray and tessalon perles.  She is still taking the antihistamine.    The following portions of the patient's history were reviewed and updated as appropriate:  She  has a past medical history of Iron deficiency anemia; Asthma; Diabetes mellitus; Hyperlipidemia; History of colon polyps; Hypothyroidism; Abnormal liver function tests; Hypertension; GERD (gastroesophageal reflux disease); IBS (irritable bowel syndrome); and Diverticulosis. She  does not have any pertinent problems on file. She  has past surgical history that includes Abdominal hysterectomy; Appendectomy; Gastric bypass; Coronary angioplasty; Cholecystectomy; Tonsillectomy and adenoidectomy; Bladder repair; and Abdominal hysterectomy. Her family history includes Diabetes in her sister; Heart disease in her maternal grandmother; Stroke in her mother. She  reports that she quit smoking about 53 years ago. Her smoking use included Cigarettes. She has a .6 pack-year smoking history. She has never used smokeless tobacco. She reports that she drinks about 8.4 oz of alcohol per week. She reports that she does not use illicit drugs. She has a current medication list which includes the following prescription(s): acetaminophen, albuterol, benzonatate, budesonide-formoterol, cholecalciferol, fluticasone, grape seed, inulin, ipratropium, loperamide, loratadine, multivitamin with minerals, pantoprazole, probiotic product, simethicone, synthroid, venlafaxine xr, and clarithromycin. Current  Outpatient Prescriptions on File Prior to Visit  Medication Sig Dispense Refill  . acetaminophen (TYLENOL) 650 MG CR tablet Take 650 mg by mouth every 8 (eight) hours as needed for pain.    Marland Kitchen albuterol (PROVENTIL HFA) 108 (90 BASE) MCG/ACT inhaler Inhale 2 puffs into the lungs every 6 (six) hours as needed. 1 Inhaler 2  . budesonide-formoterol (SYMBICORT) 160-4.5 MCG/ACT inhaler Inhale 2 puffs into the lungs 2 (two) times daily. 1 Inhaler 5  . cholecalciferol (VITAMIN D) 1000 UNITS tablet Take 1,000 Units by mouth daily.      . fluticasone (FLONASE) 50 MCG/ACT nasal spray USE 2 SPRAYS IN EACH NOSTRIL ONCE DAILY 16 g 1  . GRAPE SEED EXTRACT PO Take 1 tablet by mouth daily.    . Inulin (FIBER CHOICE FRUITY BITES PO) Take 2 tablets by mouth daily.    Marland Kitchen loperamide (IMODIUM) 2 MG capsule Take 2 mg by mouth as needed. 2 in the morning and 2 in the afternoon    . loratadine (CLARITIN) 10 MG tablet Take 1 tablet (10 mg total) by mouth daily. 30 tablet 11  . Multiple Vitamins-Minerals (MULTIVITAMIN WITH MINERALS) tablet Take 2 tablets by mouth daily.    . pantoprazole (PROTONIX) 40 MG tablet Take 1 tablet (40 mg total) by mouth daily. Office visit due now 90 tablet 3  . Probiotic Product (ACIDOPHILUS PROBIOTIC BLEND PO) Take by mouth.      . Simethicone (GAS-X EXTRA STRENGTH) 62.5 MG STRP Take by mouth. As needed     . SYNTHROID 125 MCG tablet TAKE 1 TABLET EACH DAY. 90 tablet 3  . venlafaxine XR (EFFEXOR-XR) 75 MG 24 hr capsule TAKE 1 CAPSULE EVERY DAY. 90 capsule 3   No current facility-administered medications on file prior to visit.   She is allergic to codeine; doxycycline; montelukast sodium; penicillins; pioglitazone; rosiglitazone  maleate; secobarbital sodium; and sulfonamide derivatives..  Review of Systems As above  Objective:    BP 118/72 mmHg  Pulse 85  Temp(Src) 98.9 F (37.2 C) (Oral)  Wt 179 lb (81.194 kg)  SpO2 98% General appearance: alert, cooperative, appears stated age and  no distress Ears: normal TM's and external ear canals both ears Nose: green discharge, moderate congestion, sinus tenderness bilateral Throat: lips, mucosa, and tongue normal; teeth and gums normal Neck: mild anterior cervical adenopathy, supple, symmetrical, trachea midline and thyroid not enlarged, symmetric, no tenderness/mass/nodules Lungs: clear to auscultation bilaterally Heart: S1, S2 normal    Assessment:    Acute bacterial sinusitis.    Plan:    Nasal steroids per medication orders. Antihistamines per medication orders. Biaxin per medication orders.   Depo medrol given

## 2016-01-22 NOTE — Patient Instructions (Signed)

## 2016-02-03 ENCOUNTER — Encounter: Payer: Self-pay | Admitting: Internal Medicine

## 2016-02-10 ENCOUNTER — Ambulatory Visit (INDEPENDENT_AMBULATORY_CARE_PROVIDER_SITE_OTHER): Payer: Medicare Other | Admitting: *Deleted

## 2016-02-10 DIAGNOSIS — E538 Deficiency of other specified B group vitamins: Secondary | ICD-10-CM

## 2016-02-10 LAB — VITAMIN B12: Vitamin B-12: >1500 pg/mL — ABNORMAL HIGH (ref 211–911)

## 2016-02-10 MED ORDER — CYANOCOBALAMIN 1000 MCG/ML IJ SOLN
1000.0000 ug | Freq: Once | INTRAMUSCULAR | Status: AC
  Administered 2016-02-10: 1000 ug via INTRAMUSCULAR

## 2016-02-10 NOTE — Progress Notes (Signed)
Pre visit review using our clinic review tool, if applicable. No additional management support is needed unless otherwise documented below in the visit note.  Pt tolerated injection well.   Vint Pola J Wyona Neils, RN  

## 2016-02-17 ENCOUNTER — Other Ambulatory Visit: Payer: Self-pay | Admitting: Family Medicine

## 2016-02-23 ENCOUNTER — Other Ambulatory Visit: Payer: Self-pay | Admitting: Family Medicine

## 2016-03-16 ENCOUNTER — Ambulatory Visit: Payer: Medicare Other

## 2016-05-03 ENCOUNTER — Telehealth: Payer: Self-pay | Admitting: Family Medicine

## 2016-05-03 NOTE — Telephone Encounter (Signed)
Please advise      KP 

## 2016-05-03 NOTE — Telephone Encounter (Signed)
Pt left VM 05/03/16 12:59pm to cancel appt and RX for b12. Nothing specific noted in appt. LM for pt to call in.

## 2016-05-03 NOTE — Telephone Encounter (Signed)
Ok to b12 injections monthly

## 2016-05-03 NOTE — Telephone Encounter (Signed)
Can be reached: [phone] Pharmacy: [facility]  Reason for call: Pt said that she is wanting to get RX for b12 injectible. She is at [facility] facility now and they can give it to her there. She hasn't had it for 2 months.

## 2016-05-04 ENCOUNTER — Other Ambulatory Visit: Payer: Self-pay | Admitting: Family Medicine

## 2016-05-04 MED ORDER — CYANOCOBALAMIN 1000 MCG/ML IJ SOLN: 1000.0000 ug | INTRAMUSCULAR | Status: DC

## 2016-05-04 NOTE — Telephone Encounter (Signed)
Medication has been faxed.       KP 

## 2016-05-07 ENCOUNTER — Telehealth: Payer: Self-pay | Admitting: Family Medicine

## 2016-05-07 NOTE — Telephone Encounter (Signed)
Pt dropped off document to be filled out by PCP (Physician Release Form). Pt would like to have it mailed to home address on file.

## 2016-05-07 NOTE — Telephone Encounter (Signed)
Form completed as much as possibe and forwarded to Dr. Carollee Herter. JG//CMA

## 2016-05-10 NOTE — Telephone Encounter (Signed)
Completed/signed form mailed to pt's home address as requested. Copy sent for scanning. JG//CMA

## 2016-05-24 ENCOUNTER — Telehealth: Payer: Self-pay | Admitting: Family Medicine

## 2016-05-24 NOTE — Telephone Encounter (Signed)
Labs was high this time--- would like to try to go every other month  Or she can try b12 sublingual otc----- as stated on labs in my chart

## 2016-05-24 NOTE — Telephone Encounter (Signed)
Please advise. JG/CMA 

## 2016-05-24 NOTE — Telephone Encounter (Signed)
Caller name: Relationship to patient: [facility] [employer] Can be reached: [phone]  Reason for call: Request order to give B12 injections once a month

## 2016-06-10 ENCOUNTER — Encounter: Payer: Medicare Other | Admitting: Family Medicine

## 2016-07-20 ENCOUNTER — Other Ambulatory Visit: Payer: Self-pay | Admitting: Family Medicine

## 2016-07-30 ENCOUNTER — Encounter: Payer: Medicare Other | Admitting: Family Medicine

## 2016-08-06 ENCOUNTER — Ambulatory Visit (INDEPENDENT_AMBULATORY_CARE_PROVIDER_SITE_OTHER): Payer: Medicare Other | Admitting: Family Medicine

## 2016-08-06 ENCOUNTER — Encounter: Payer: Self-pay | Admitting: Family Medicine

## 2016-08-06 ENCOUNTER — Other Ambulatory Visit (HOSPITAL_COMMUNITY)
Admission: RE | Admit: 2016-08-06 | Discharge: 2016-08-06 | Disposition: A | Discharge status: Home / Self Care | Payer: Medicare Other | Source: Ambulatory Visit | Attending: Family Medicine | Admitting: Family Medicine

## 2016-08-06 VITALS — BP 136/78 | HR 81 | Temp 98.1°F | Ht 67.5 in | Wt 181.6 lb

## 2016-08-06 DIAGNOSIS — F32A Depression, unspecified: Secondary | ICD-10-CM

## 2016-08-06 DIAGNOSIS — Z0000 Encounter for general adult medical examination without abnormal findings: Secondary | ICD-10-CM | POA: Diagnosis not present

## 2016-08-06 DIAGNOSIS — F329 Major depressive disorder, single episode, unspecified: Secondary | ICD-10-CM

## 2016-08-06 DIAGNOSIS — Z23 Encounter for immunization: Secondary | ICD-10-CM | POA: Diagnosis not present

## 2016-08-06 DIAGNOSIS — E1151 Type 2 diabetes mellitus with diabetic peripheral angiopathy without gangrene: Secondary | ICD-10-CM

## 2016-08-06 DIAGNOSIS — K219 Gastro-esophageal reflux disease without esophagitis: Secondary | ICD-10-CM

## 2016-08-06 DIAGNOSIS — E785 Hyperlipidemia, unspecified: Secondary | ICD-10-CM

## 2016-08-06 DIAGNOSIS — Z124 Encounter for screening for malignant neoplasm of cervix: Secondary | ICD-10-CM | POA: Insufficient documentation

## 2016-08-06 DIAGNOSIS — F418 Other specified anxiety disorders: Secondary | ICD-10-CM

## 2016-08-06 DIAGNOSIS — Z1272 Encounter for screening for malignant neoplasm of vagina: Secondary | ICD-10-CM

## 2016-08-06 DIAGNOSIS — Z1239 Encounter for other screening for malignant neoplasm of breast: Secondary | ICD-10-CM

## 2016-08-06 DIAGNOSIS — E538 Deficiency of other specified B group vitamins: Secondary | ICD-10-CM | POA: Diagnosis not present

## 2016-08-06 DIAGNOSIS — E038 Other specified hypothyroidism: Secondary | ICD-10-CM

## 2016-08-06 DIAGNOSIS — F419 Anxiety disorder, unspecified: Secondary | ICD-10-CM

## 2016-08-06 DIAGNOSIS — E2839 Other primary ovarian failure: Secondary | ICD-10-CM

## 2016-08-06 LAB — HEMOGLOBIN A1C: Hgb A1c MFr Bld: 5.2 % (ref 4.6–6.5)

## 2016-08-06 LAB — TSH: TSH: 2.13 u[IU]/mL (ref 0.35–4.50)

## 2016-08-06 LAB — CBC WITH DIFFERENTIAL/PLATELET
BASOS PCT: 0.8 % (ref 0.0–3.0)
Basophils Absolute: 0 10*3/uL (ref 0.0–0.1)
EOS PCT: 1 % (ref 0.0–5.0)
Eosinophils Absolute: 0.1 10*3/uL (ref 0.0–0.7)
HEMATOCRIT: 36.3 % (ref 36.0–46.0)
HEMOGLOBIN: 12.6 g/dL (ref 12.0–15.0)
LYMPHS PCT: 30.3 % (ref 12.0–46.0)
Lymphs Abs: 1.8 10*3/uL (ref 0.7–4.0)
MCHC: 34.7 g/dL (ref 30.0–36.0)
MCV: 94.9 fl (ref 78.0–100.0)
MONOS PCT: 5.3 % (ref 3.0–12.0)
Monocytes Absolute: 0.3 10*3/uL (ref 0.1–1.0)
NEUTROS ABS: 3.7 10*3/uL (ref 1.4–7.7)
Neutrophils Relative %: 62.6 % (ref 43.0–77.0)
PLATELETS: 243 10*3/uL (ref 150.0–400.0)
RBC: 3.83 Mil/uL — ABNORMAL LOW (ref 3.87–5.11)
RDW: 13.6 % (ref 11.5–15.5)
WBC: 5.9 10*3/uL (ref 4.0–10.5)

## 2016-08-06 LAB — POCT URINALYSIS DIPSTICK
Bilirubin, UA: NEGATIVE
Blood, UA: NEGATIVE
GLUCOSE UA: NEGATIVE
Ketones, UA: NEGATIVE
LEUKOCYTES UA: NEGATIVE
Nitrite, UA: NEGATIVE
PROTEIN UA: NEGATIVE
SPEC GRAV UA: 1.02
Urobilinogen, UA: NEGATIVE
pH, UA: 6

## 2016-08-06 LAB — COMPREHENSIVE METABOLIC PANEL
ALBUMIN: 4.3 g/dL (ref 3.5–5.2)
ALK PHOS: 53 U/L (ref 39–117)
ALT: 36 U/L — AB (ref 0–35)
AST: 36 U/L (ref 0–37)
BILIRUBIN TOTAL: 0.5 mg/dL (ref 0.2–1.2)
BUN: 14 mg/dL (ref 6–23)
CALCIUM: 9.1 mg/dL (ref 8.4–10.5)
CHLORIDE: 103 meq/L (ref 96–112)
CO2: 31 mEq/L (ref 19–32)
CREATININE: 0.65 mg/dL (ref 0.40–1.20)
GFR: 94.91 mL/min (ref >60.00)
Glucose, Bld: 107 mg/dL — ABNORMAL HIGH (ref 70–99)
Potassium: 4.1 mEq/L (ref 3.5–5.1)
Sodium: 138 mEq/L (ref 135–145)
TOTAL PROTEIN: 7.1 g/dL (ref 6.0–8.3)

## 2016-08-06 LAB — LIPID PANEL
CHOLESTEROL: 172 mg/dL (ref 0–200)
HDL: 55.6 mg/dL (ref >39.00)
LDL Cholesterol: 91 mg/dL (ref 0–99)
NonHDL: 116.44
TRIGLYCERIDES: 129 mg/dL (ref 0.0–149.0)
Total CHOL/HDL Ratio: 3
VLDL: 25.8 mg/dL (ref 0.0–40.0)

## 2016-08-06 LAB — VITAMIN B12: VITAMIN B 12: 395 pg/mL (ref 211–911)

## 2016-08-06 MED ORDER — PANTOPRAZOLE SODIUM 40 MG PO TBEC: 40.0000 mg | DELAYED_RELEASE_TABLET | Freq: Every day | ORAL | 1 refills | Status: DC

## 2016-08-06 MED ORDER — VENLAFAXINE HCL ER 75 MG PO CP24: 75.0000 mg | ORAL_CAPSULE | Freq: Every day | ORAL | 1 refills | Status: DC

## 2016-08-06 MED ORDER — CYANOCOBALAMIN 1000 MCG/ML IJ SOLN: 1000.0000 ug | INTRAMUSCULAR | 4 refills | Status: DC

## 2016-08-06 NOTE — Progress Notes (Signed)
Pre visit review using our clinic review tool, if applicable. No additional management support is needed unless otherwise documented below in the visit note. 

## 2016-08-06 NOTE — Progress Notes (Signed)
Subjective:   Stacy Ayers is a 75 y.o. female who presents for Medicare Annual (Subsequent) preventive examination.  Review of Systems:   Review of Systems  Constitutional: Negative for activity change, appetite change and fatigue.  HENT: Negative for hearing loss, congestion, tinnitus and ear discharge.   Eyes: Negative for visual disturbance (see optho q1y -- vision corrected to 20/20 with glasses).  Respiratory: Negative for cough, chest tightness and shortness of breath.   Cardiovascular: Negative for chest pain, palpitations and leg swelling.  Gastrointestinal: Negative for abdominal pain, diarrhea, constipation and abdominal distention.  Genitourinary: Negative for urgency, frequency, decreased urine volume and difficulty urinating.  Musculoskeletal: Negative for back pain, arthralgias and gait problem.  Skin: Negative for color change, pallor and rash.  Neurological: Negative for dizziness, light-headedness, numbness and headaches.  Hematological: Negative for adenopathy. Does not bruise/bleed easily.  Psychiatric/Behavioral: Negative for suicidal ideas, confusion, sleep disturbance, self-injury, dysphoric mood, decreased concentration and agitation.  Pt is able to read and write and can do all ADLs No risk for falling No abuse/ violence in home          Objective:     Vitals: BP 136/78 (BP Location: Left Arm, Patient Position: Sitting, Cuff Size: Normal)   Pulse 81   Temp 98.1 F (36.7 C) (Oral)   Ht 5' 7.5" (1.715 m)   Wt 181 lb 9.6 oz (82.4 kg)   SpO2 98%   BMI 28.02 kg/m   Body mass index is 28.02 kg/m. BP 136/78 (BP Location: Left Arm, Patient Position: Sitting, Cuff Size: Normal)   Pulse 81   Temp 98.1 F (36.7 C) (Oral)   Ht 5' 7.5" (1.715 m)   Wt 181 lb 9.6 oz (82.4 kg)   SpO2 98%   BMI 28.02 kg/m  General appearance: alert, cooperative, appears stated age and no distress Head: Normocephalic, without obvious abnormality, atraumatic Eyes:  conjunctivae/corneas clear. PERRL, EOM's intact. Fundi benign. Ears: normal TM's and external ear canals both ears Nose: Nares normal. Septum midline. Mucosa normal. No drainage or sinus tenderness. Throat: lips, mucosa, and tongue normal; teeth and gums normal Neck: no adenopathy, no carotid bruit, no JVD, supple, symmetrical, trachea midline and thyroid not enlarged, symmetric, no tenderness/mass/nodules Back: symmetric, no curvature. ROM normal. No CVA tenderness. Lungs: clear to auscultation bilaterally Breasts: normal appearance, no masses or tenderness Heart: regular rate and rhythm, S1, S2 normal, no murmur, click, rub or gallop Abdomen: soft, non-tender; bowel sounds normal; no masses,  no organomegaly Pelvic: cervix normal in appearance, external genitalia normal, no adnexal masses or tenderness, no cervical motion tenderness, rectovaginal septum normal, uterus normal size, shape, and consistency, vagina normal without discharge and pap done, rectal -- heme neg brown stool Extremities: extremities normal, atraumatic, no cyanosis or edema Pulses: 2+ and symmetric Skin: Skin color, texture, turgor normal. No rashes or lesions Lymph nodes: Cervical, supraclavicular, and axillary nodes normal. Neurologic: Alert and oriented X 3, normal strength and tone. Normal symmetric reflexes. Normal coordination and gait  Tobacco History  Smoking Status  . Former Smoker  . Packs/day: 0.30  . Years: 2.00  . Types: Cigarettes  . Quit date: 07/11/1962  Smokeless Tobacco  . Never Used     Counseling given: Not Answered   Past Medical History:  Diagnosis Date  . Abnormal liver function tests   . Asthma   . Diabetes mellitus   . Diverticulosis   . GERD (gastroesophageal reflux disease)   . History of colon polyps  adenomatous  . Hyperlipidemia   . Hypertension   . Hypothyroidism   . IBS (irritable bowel syndrome)   . Iron deficiency anemia    Past Surgical History:  Procedure  Laterality Date  . ABDOMINAL HYSTERECTOMY    . ABDOMINAL HYSTERECTOMY    . APPENDECTOMY    . BLADDER REPAIR    . CHOLECYSTECTOMY    . CORONARY ANGIOPLASTY    . GASTRIC BYPASS    . TONSILLECTOMY AND ADENOIDECTOMY     Family History  Problem Relation Age of Onset  . Diabetes Sister   . Heart disease Maternal Grandmother   . Stroke Mother    History  Sexual Activity  . Sexual activity: Not Currently    Outpatient Encounter Prescriptions as of 08/06/2016  Medication Sig  . acetaminophen (TYLENOL) 650 MG CR tablet Take 650 mg by mouth every 8 (eight) hours as needed for pain.  Marland Kitchen albuterol (PROVENTIL HFA) 108 (90 BASE) MCG/ACT inhaler Inhale 2 puffs into the lungs every 6 (six) hours as needed.  . B-COMPLEX-C PO Take 1 tablet by mouth daily.  . benzonatate (TESSALON) 100 MG capsule   . budesonide-formoterol (SYMBICORT) 160-4.5 MCG/ACT inhaler Inhale 2 puffs into the lungs 2 (two) times daily.  . cholecalciferol (VITAMIN D) 1000 UNITS tablet Take 1,000 Units by mouth daily.    . cyanocobalamin (,VITAMIN B-12,) 1000 MCG/ML injection Inject 1 mL (1,000 mcg total) into the muscle every 30 (thirty) days.  . fluticasone (FLONASE) 50 MCG/ACT nasal spray USE 2 SPRAYS IN EACH NOSTRIL ONCE DAILY  . GRAPE SEED EXTRACT PO Take 1 tablet by mouth daily.  . Inulin (FIBER CHOICE FRUITY BITES PO) Take 2 tablets by mouth daily.  Marland Kitchen ipratropium (ATROVENT) 0.06 % nasal spray   . loperamide (IMODIUM) 2 MG capsule Take 2 mg by mouth as needed. 2 in the morning and 2 in the afternoon  . loratadine (CLARITIN) 10 MG tablet Take 1 tablet (10 mg total) by mouth daily.  . pantoprazole (PROTONIX) 40 MG tablet Take 1 tablet (40 mg total) by mouth daily.  . Probiotic Product (ACIDOPHILUS PROBIOTIC BLEND PO) Take by mouth.    . Simethicone (GAS-X EXTRA STRENGTH) 62.5 MG STRP Take by mouth. As needed   . SYNTHROID 125 MCG tablet TAKE 1 TABLET EACH DAY.  Marland Kitchen venlafaxine XR (EFFEXOR-XR) 75 MG 24 hr capsule Take 1 capsule  (75 mg total) by mouth daily.  . [DISCONTINUED] cyanocobalamin (,VITAMIN B-12,) 1000 MCG/ML injection Inject 1 mL (1,000 mcg total) into the muscle every 30 (thirty) days.  . [DISCONTINUED] pantoprazole (PROTONIX) 40 MG tablet TAKE 1 TABLET ONCE DAILY.  . [DISCONTINUED] venlafaxine XR (EFFEXOR-XR) 75 MG 24 hr capsule TAKE 1 CAPSULE EVERY DAY.  . [DISCONTINUED] Multiple Vitamins-Minerals (MULTIVITAMIN WITH MINERALS) tablet Take 2 tablets by mouth daily.   No facility-administered encounter medications on file as of 08/06/2016.     Activities of Daily Living In your present state of health, do you have any difficulty performing the following activities: 08/06/2016 08/08/2015  Hearing? N N  Vision? N Y  Difficulty concentrating or making decisions? N Y  Walking or climbing stairs? N Y  Dressing or bathing? N N  Doing errands, shopping? N N  Preparing Food and eating ? - N  Using the Toilet? - N  In the past six months, have you accidently leaked urine? - Y  Do you have problems with loss of bowel control? - Y  Managing your Medications? - N  Managing your Finances? -  N  Housekeeping or managing your Housekeeping? - N  Some recent data might be hidden    Patient Care Team: [name], DO as PCP - General Gaynelle Arabian, MD as Consulting Physician (Orthopedic Surgery) Marica Otter, Bowler as Consulting Physician (Optometry) Nyra Capes as Consulting Physician (Dentistry) Marica Otter, OD (Optometry)    Assessment:    cpe Exercise Activities and Dietary recommendations Current Exercise Habits: Structured exercise class, Type of exercise: Other - see comments;walking (water aerobics ), Time (Minutes): > 60, Frequency (Times/Week): 5, Weekly Exercise (Minutes/Week): 0, Intensity: Moderate, Exercise limited by: None identified  Goals    . Increase physical activity    . Lose 20 lbs by next year.  (pt-stated)      Fall Risk Fall Risk  08/06/2016 08/08/2015 12/27/2014 12/19/2013  04/18/2013  Falls in the past year? Yes No Yes No Yes  Number falls in past yr: 1 - 2 or more - 2 or more  Injury with Fall? No - - - -  Risk for fall due to : Other (Comment) - - - Impaired balance/gait  Follow up Falls prevention discussed - - - -   Depression Screen PHQ 2/9 Scores 08/06/2016 08/08/2015 12/27/2014 12/19/2013  PHQ - 2 Score 0 0 0 0     Cognitive Testing MMSE - Mini Mental State Exam 08/06/2016 08/08/2015  Orientation to time 5 5  Orientation to Place 5 5  Registration 3 3  Attention/ Calculation 5 5  Recall 3 3  Language- name 2 objects 2 2  Language- repeat 1 1  Language- follow 3 step command 3 3  Language- read & follow direction 1 1  Write a sentence 1 1  Copy design 1 1  Total score 30 30    Immunization History  Administered Date(s) Administered  . Influenza Split 09/08/2011, 08/25/2012  . Influenza Whole 08/26/2010  . Influenza, High Dose Seasonal PF 08/06/2016  . Influenza,inj,Quad PF,36+ Mos 08/09/2014, 08/08/2015  . Pneumococcal Conjugate-13 12/27/2014  . Pneumococcal Polysaccharide-23 08/05/2010  . Tdap 07/12/2011  . Zoster 07/21/2011   Screening Tests Health Maintenance  Topic Date Due  . HEMOGLOBIN A1C  [birth year]  . URINE MICROALBUMIN  05/27/1953  . FOOT EXAM  08/07/2016  . OPHTHALMOLOGY EXAM  12/17/2016  . MAMMOGRAM  09/14/2017  . COLONOSCOPY  07/11/2020  . TETANUS/TDAP  07/11/2021  . INFLUENZA VACCINE  Completed  . DEXA SCAN  Completed  . ZOSTAVAX  Completed  . PNA vac Low Risk Adult  Completed      Plan:    see AVS During the course of the visit the patient was educated and counseled about the following appropriate screening and preventive services:   Vaccines to include Pneumoccal, Influenza, Hepatitis B, Td, Zostavax, HCV  Electrocardiogram  Cardiovascular Disease  Colorectal cancer screening  Bone density screening  Diabetes screening  Glaucoma screening  Mammography/PAP  Nutrition counseling   Patient  Instructions (the written plan) was given to the patient.  - venlafaxine XR (EFFEXOR-XR) 75 MG 24 hr capsule; Take 1 capsule (75 mg total) by mouth daily.  Dispense: 90 capsule; Refill: 1 1. Need for prophylactic vaccination and inoculation against influenza   - Flu vaccine HIGH DOSE PF (Fluzone High dose)  2. Other specified hypothyroidism   - TSH  3. Anxiety and depression   - venlafaxine XR (EFFEXOR-XR) 75 MG 24 hr capsule; Take 1 capsule (75 mg total) by mouth daily.  Dispense: 90 capsule; Refill: 1  4. B12 deficiency   -  cyanocobalamin (,VITAMIN B-12,) 1000 MCG/ML injection; Inject 1 mL (1,000 mcg total) into the muscle every 30 (thirty) days.  Dispense: 3 mL; Refill: 4 - Vitamin B12  5. Gastroesophageal reflux disease, esophagitis presence not specified   - pantoprazole (PROTONIX) 40 MG tablet; Take 1 tablet (40 mg total) by mouth daily.  Dispense: 90 tablet; Refill: 1  6. DM (diabetes mellitus) type II controlled peripheral vascular disorder (HCC)   - Comprehensive metabolic panel - CBC with Differential/Platelet - POCT urinalysis dipstick - Hemoglobin A1c  7. Hyperlipidemia LDL goal <70   - Lipid panel - CBC with Differential/Platelet  8. Breast cancer screening   - MM DIGITAL SCREENING BILATERAL; Future  9. Estrogen deficiency   - DG Bone Density; Future  10. Routine history and physical examination of adult   - Cytology - PAP  11. Vaginal Pap smear   - Cytology - PAP  [name], DO  08/06/2016

## 2016-08-10 LAB — CYTOLOGY - PAP

## 2016-09-17 ENCOUNTER — Telehealth: Payer: Self-pay | Admitting: Family Medicine

## 2016-09-17 DIAGNOSIS — E2839 Other primary ovarian failure: Secondary | ICD-10-CM

## 2016-09-17 NOTE — Telephone Encounter (Signed)
Caller name: Tresa Moore Relationship to patient: [facility] Can be reached: [phone] Pharmacy:  Reason for call:  [facility] needs an order for the Bone Density Test for patient

## 2016-09-17 NOTE — Telephone Encounter (Signed)
The new order has been placed.    KP

## 2016-09-27 ENCOUNTER — Other Ambulatory Visit: Payer: Self-pay | Admitting: Family Medicine

## 2016-11-01 ENCOUNTER — Other Ambulatory Visit: Payer: Self-pay | Admitting: Family Medicine

## 2016-12-21 ENCOUNTER — Encounter: Payer: Self-pay | Admitting: Internal Medicine

## 2016-12-31 ENCOUNTER — Ambulatory Visit
Admission: RE | Admit: 2016-12-31 | Discharge: 2016-12-31 | Disposition: A | Discharge status: Home / Self Care | Payer: Medicare Other | Source: Ambulatory Visit | Attending: Family Medicine | Admitting: Family Medicine

## 2016-12-31 DIAGNOSIS — Z1239 Encounter for other screening for malignant neoplasm of breast: Secondary | ICD-10-CM

## 2016-12-31 DIAGNOSIS — E2839 Other primary ovarian failure: Secondary | ICD-10-CM

## 2017-01-03 ENCOUNTER — Other Ambulatory Visit: Payer: Self-pay | Admitting: Family Medicine

## 2017-01-03 MED ORDER — ALENDRONATE SODIUM 70 MG PO TABS: 70.0000 mg | ORAL_TABLET | ORAL | 11 refills | Status: DC

## 2017-01-04 ENCOUNTER — Other Ambulatory Visit: Payer: Self-pay | Admitting: Family Medicine

## 2017-01-04 DIAGNOSIS — J40 Bronchitis, not specified as acute or chronic: Secondary | ICD-10-CM

## 2017-01-04 DIAGNOSIS — J329 Chronic sinusitis, unspecified: Secondary | ICD-10-CM

## 2017-02-02 ENCOUNTER — Other Ambulatory Visit: Payer: Self-pay | Admitting: Family Medicine

## 2017-02-23 ENCOUNTER — Encounter: Payer: Medicare Other | Admitting: Internal Medicine

## 2017-03-03 ENCOUNTER — Other Ambulatory Visit: Payer: Self-pay | Admitting: Family Medicine

## 2017-03-03 DIAGNOSIS — F329 Major depressive disorder, single episode, unspecified: Secondary | ICD-10-CM

## 2017-03-03 DIAGNOSIS — F419 Anxiety disorder, unspecified: Principal | ICD-10-CM

## 2017-03-03 DIAGNOSIS — F32A Depression, unspecified: Secondary | ICD-10-CM

## 2017-03-03 DIAGNOSIS — K219 Gastro-esophageal reflux disease without esophagitis: Secondary | ICD-10-CM

## 2017-04-01 ENCOUNTER — Encounter: Payer: Self-pay | Admitting: Medical

## 2017-04-01 ENCOUNTER — Telehealth: Payer: Self-pay

## 2017-04-01 ENCOUNTER — Telehealth: Payer: Self-pay | Admitting: Medical

## 2017-04-01 ENCOUNTER — Ambulatory Visit (INDEPENDENT_AMBULATORY_CARE_PROVIDER_SITE_OTHER): Payer: Medicare Other | Admitting: Medical

## 2017-04-01 VITALS — BP 134/71 | HR 74 | Temp 98.3°F | Resp 16 | Ht 67.0 in | Wt 184.6 lb

## 2017-04-01 DIAGNOSIS — J029 Acute pharyngitis, unspecified: Secondary | ICD-10-CM

## 2017-04-01 DIAGNOSIS — J301 Allergic rhinitis due to pollen: Secondary | ICD-10-CM | POA: Diagnosis not present

## 2017-04-01 DIAGNOSIS — R002 Palpitations: Secondary | ICD-10-CM

## 2017-04-01 DIAGNOSIS — E039 Hypothyroidism, unspecified: Secondary | ICD-10-CM | POA: Diagnosis not present

## 2017-04-01 DIAGNOSIS — I499 Cardiac arrhythmia, unspecified: Secondary | ICD-10-CM

## 2017-04-01 DIAGNOSIS — J3489 Other specified disorders of nose and nasal sinuses: Secondary | ICD-10-CM | POA: Diagnosis not present

## 2017-04-01 LAB — COMPREHENSIVE METABOLIC PANEL
ALBUMIN: 4.2 g/dL (ref 3.5–5.2)
ALT: 45 U/L — ABNORMAL HIGH (ref 0–35)
AST: 52 U/L — ABNORMAL HIGH (ref 0–37)
Alkaline Phosphatase: 48 U/L (ref 39–117)
BUN: 12 mg/dL (ref 6–23)
CO2: 28 mEq/L (ref 19–32)
Calcium: 9.6 mg/dL (ref 8.4–10.5)
Chloride: 102 mEq/L (ref 96–112)
Creatinine, Ser: 0.67 mg/dL (ref 0.40–1.20)
GFR: 91.49 mL/min (ref >60.00)
GLUCOSE: 93 mg/dL (ref 70–99)
POTASSIUM: 4.2 meq/L (ref 3.5–5.1)
Sodium: 137 mEq/L (ref 135–145)
TOTAL PROTEIN: 6.9 g/dL (ref 6.0–8.3)
Total Bilirubin: 0.5 mg/dL (ref 0.2–1.2)

## 2017-04-01 LAB — POCT INR: INR: POSITIVE

## 2017-04-01 LAB — POCT RAPID STREP A (OFFICE): Rapid Strep A Screen: POSITIVE — AB

## 2017-04-01 LAB — TROPONIN I: TNIDX: 0.03 ug/l (ref 0.00–0.06)

## 2017-04-01 LAB — TSH: TSH: 2.86 u[IU]/mL (ref 0.35–4.50)

## 2017-04-01 LAB — T4, FREE: Free T4: 0.87 ng/dL (ref 0.60–1.60)

## 2017-04-01 MED ORDER — LOSARTAN POTASSIUM 50 MG PO TABS: 50.0000 mg | ORAL_TABLET | Freq: Every day | ORAL | 0 refills | Status: DC

## 2017-04-01 MED ORDER — AZITHROMYCIN 250 MG PO TABS: ORAL_TABLET | ORAL | 0 refills | Status: DC

## 2017-04-01 MED ORDER — FLUTICASONE PROPIONATE 50 MCG/ACT NA SUSP: 2.0000 | Freq: Every day | NASAL | 1 refills | Status: DC

## 2017-04-01 NOTE — Telephone Encounter (Signed)
Patient returned call. Informed her that Rapid Strep Test was normal. States she will start ABT she picked up from pharmacy.

## 2017-04-01 NOTE — Telephone Encounter (Signed)
See cardiologist referral. Can you get her in by next week.

## 2017-04-01 NOTE — Addendum Note (Signed)
Addended by: Vernie Shanks E on: 04/01/2017 01:51 PM   Modules accepted: Orders

## 2017-04-01 NOTE — Progress Notes (Signed)
Subjective:    Patient ID: Stacy Ayers, female    DOB: 1941-10-20, 76 y.o.   MRN: [id number]  HPI   Pt in reporting last night her bp was 144/79, 156/71 last night. She had mild headache.  Pt blood pressure most of the time in past was controlled. Sometimes in past systolic was high. Last night she had mild ha. But ha was very low level  and transient.  Pt has some nasal congestion and her face feels puffy. Her lids feel little swollen. She feels pnd. Pt not using flonase or antihistamine. Some sinus pain.  2 days ago woke up and had red conjunctiva. Pt though subconjunctival hemorrhage maybe bp related.    Pt states Sunday and Monday she would have intermittent sensation of skipped beat/palpitations. Pt in past states when thyroid not well controlled had palpitation. She describes as intermittnet and not severe. No sustained fast heart rate. No chest pain or sob associated.   Review of Systems  HENT: Positive for congestion, postnasal drip, sinus pain, sinus pressure and sore throat. Negative for ear pain.   Respiratory: Negative for apnea, cough, choking, shortness of breath and wheezing.   Cardiovascular: Positive for palpitations. Negative for chest pain and leg swelling.       None since Monday and Tuesday.  Gastrointestinal: Negative for abdominal pain, constipation and vomiting.  Genitourinary: Negative for difficulty urinating, dyspareunia and dysuria.  Musculoskeletal: Negative for arthralgias, back pain, joint swelling, neck pain and neck stiffness.  Neurological: Positive for headaches. Negative for dizziness.       Yesterday but none today.  Hematological: Negative for adenopathy. Does not bruise/bleed easily.  Psychiatric/Behavioral: Negative for behavioral problems, confusion and sleep disturbance. The patient is not nervous/anxious.    Past Medical History:  Diagnosis Date  . Abnormal liver function tests   . Asthma   . Diabetes mellitus   . Diverticulosis   .  GERD (gastroesophageal reflux disease)   . History of colon polyps    adenomatous  . Hyperlipidemia   . Hypertension   . Hypothyroidism   . IBS (irritable bowel syndrome)   . Iron deficiency anemia      Social History   Social History  . Marital status: Single    Spouse name: N/A  . Number of children: 1  . Years of education: N/A   Occupational History  . retired Pharmacist, hospital   .  Retired   Social History Main Topics  . Smoking status: Former Smoker    Packs/day: 0.30    Years: 2.00    Types: Cigarettes    Quit date: 07/11/1962  . Smokeless tobacco: Never Used  . Alcohol use 8.4 oz/week    14 Glasses of wine per week  . Drug use: No  . Sexual activity: Not Currently   Other Topics Concern  . Not on file   Social History Narrative   Daily caffeine    Past Surgical History:  Procedure Laterality Date  . ABDOMINAL HYSTERECTOMY    . ABDOMINAL HYSTERECTOMY    . APPENDECTOMY    . BLADDER REPAIR    . CHOLECYSTECTOMY    . CORONARY ANGIOPLASTY    . GASTRIC BYPASS    . TONSILLECTOMY AND ADENOIDECTOMY      Family History  Problem Relation Age of Onset  . Diabetes Sister   . Heart disease Maternal Grandmother   . Stroke Mother     Allergies  Allergen Reactions  . Sulfonamide Derivatives Diarrhea  .  Codeine   . Doxycycline     REACTION: GI UPSET  . Montelukast Sodium   . Penicillins   . Pioglitazone   . Rosiglitazone Maleate   . Secobarbital Sodium     Current Outpatient Prescriptions on File Prior to Visit  Medication Sig Dispense Refill  . acetaminophen (TYLENOL) 650 MG CR tablet Take 650 mg by mouth every 8 (eight) hours as needed for pain.    Marland Kitchen alendronate (FOSAMAX) 70 MG tablet Take 1 tablet (70 mg total) by mouth once a week. Take with a full glass of water on an empty stomach. 4 tablet 11  . B-COMPLEX-C PO Take 1 tablet by mouth daily.    . benzonatate (TESSALON) 100 MG capsule     . cholecalciferol (VITAMIN D) 1000 UNITS tablet Take 1,000 Units by  mouth daily.      . cyanocobalamin (,VITAMIN B-12,) 1000 MCG/ML injection Inject 1 mL (1,000 mcg total) into the muscle every 30 (thirty) days. 3 mL 4  . fluticasone (FLONASE) 50 MCG/ACT nasal spray USE 2 SPRAYS IN EACH NOSTRIL ONCE DAILY 16 g 0  . Inulin (FIBER CHOICE FRUITY BITES PO) Take 2 tablets by mouth daily.    Marland Kitchen ipratropium (ATROVENT) 0.06 % nasal spray     . loperamide (IMODIUM) 2 MG capsule Take 2 mg by mouth as needed. 2 in the morning and 2 in the afternoon    . loratadine (CLARITIN) 10 MG tablet Take 1 tablet (10 mg total) by mouth daily. 30 tablet 11  . pantoprazole (PROTONIX) 40 MG tablet TAKE 1 TABLET ONCE DAILY. 90 tablet 0  . PROAIR HFA 108 (90 Base) MCG/ACT inhaler USE 2 PUFFS EVERY 6 HOURS AS NEEDED. 8.5 g 0  . Probiotic Product (ACIDOPHILUS PROBIOTIC BLEND PO) Take by mouth.      . Simethicone (GAS-X EXTRA STRENGTH) 62.5 MG STRP Take by mouth. As needed     . SYMBICORT 160-4.5 MCG/ACT inhaler USE 2 PUFFS TWICE A DAY. 10.2 g 0  . SYNTHROID 125 MCG tablet TAKE 1 TABLET EACH DAY. 30 tablet 0  . venlafaxine XR (EFFEXOR-XR) 75 MG 24 hr capsule TAKE 1 CAPSULE EVERY DAY. 90 capsule 0   No current facility-administered medications on file prior to visit.     BP 134/71 (BP Location: Left Arm, Patient Position: Sitting, Cuff Size: Normal)   Pulse 74   Temp 98.3 F (36.8 C) (Oral)   Resp 16   Ht 5\' 7"  (1.702 m)   Wt 184 lb 9.6 oz (83.7 kg)   SpO2 98%   BMI 28.91 kg/m       Objective:   Physical Exam  General  Mental Status - Alert. General Appearance - Well groomed. Not in acute distress.  Skin Rashes- No Rashes.  HEENT Head- Normal. Ear Auditory Canal - Left- Normal. Right - Normal.Tympanic Membrane- Left- dull. Right- Normal. Eye Sclera/Conjunctiva- Left- moderate sized subconjunctival hemorrhage Right- Normal. Nose & Sinuses Nasal Mucosa- Left-  Boggy and Congested. Right-  Boggy and  Congested.Bilateral maxillary but no  frontal sinus pressure. Mouth &  Throat Lips: Upper Lip- Normal: no dryness, cracking, pallor, cyanosis, or vesicular eruption. Lower Lip-Normal: no dryness, cracking, pallor, cyanosis or vesicular eruption. Buccal Mucosa- Bilateral- No Aphthous ulcers. Oropharynx- No Discharge or Erythema. Tonsils: Characteristics- Bilateral- mild Erythema or Congestion. Size/Enlargement- Bilateral- No enlargement. Discharge- bilateral-None.  Neck Neck- Supple. No Masses. No jvd. No carotid bruits.   Chest and Lung Exam Auscultation: Breath Sounds:-Clear even and unlabored.  Cardiovascular  Auscultation:Rythm- Regular, rate and rhythm. Murmurs & Other Heart Sounds:Ausculatation of the heart reveal- No Murmurs.  Lymphatic Head & Neck General Head & Neck Lymphatics: Bilateral: Description- No Localized lymphadenopathy.  Lower ext- no pedal edema.   General Mental Status- Alert. General Appearance- Not in acute distress.   Skin General: Color- Normal Color. Moisture- Normal Moisture.  Neck Carotid Arteries- Normal color. Moisture- Normal Moisture. No carotid bruits. No JVD.  Chest and Lung Exam Auscultation: Breath Sounds:-Normal.  Cardiovascular Auscultation:Rythm- Regular. Murmurs & Other Heart Sounds:Auscultation of the heart reveals- No Murmurs.  Abdomen Inspection:-Inspeection Normal. Palpation/Percussion:Note:No mass. Palpation and Percussion of the abdomen reveal- Non Tender, Non Distended + BS, no rebound or guarding.    Neurologic Cranial Nerve exam:- CN III-XII intact(No nystagmus), symmetric smile. .Finger to Nose:- Normal/Intact Strength:- 5/5 equal and symmetric strength both upper and lower extremities.     Assessment & Plan:  ekg show sinus rhythm but appeared double p wave lead I an Lead avl. Artifact lead v5.   Rapid strep was negative.  For recent palpitations, double p wave in I and AVL will get labs and refer you to cardiologist. You may need holter monitor and echo. Recommend in light of  palpitations recently that you stop any caffeine, sudafed or stimulant type products.  If over weekend any recurrent sustained palpitation sensation then ED evaluation.  For allergies rx flonase.  For sinus pressure/possible infectin, exposure to strep, and dull ear drum will rx azithromycin.  For htn will rx losartan 50 mg a day.  Your subconjunctival hemorrhage should absorb with time.2-3 week estimate.  Follow up in 7 days or as needed  Graelyn Bihl, Percell Miller, [employer]

## 2017-04-01 NOTE — Addendum Note (Signed)
Addended by: Bunnie Domino on: 04/01/2017 04:44 PM   Modules accepted: Orders

## 2017-04-01 NOTE — Progress Notes (Signed)
Pre visit review using our clinic review tool, if applicable. No additional management support is needed unless otherwise documented below in the visit note. 

## 2017-04-01 NOTE — Telephone Encounter (Signed)
Called patient to inform her that Rapid Strep Test was positive on later reading. ABT started by E. Saguier, PA-C during office visit for other symptoms. Left message for patient to return call.

## 2017-04-01 NOTE — Telephone Encounter (Signed)
fwded request to cardiology

## 2017-04-01 NOTE — Patient Instructions (Addendum)
For recent palpitations, double p wave in I and AVL will get labs and refer you to cardiologist. You may need holter monitor and echo. Recommend in light of palpitations recently that you stop any caffeine, sudafed or stimulant type products.  If over weekend any  recurrent sustained palpitation sensation then ED evaluation.  For allergies rx flonase.  For sinus pressure/possible infectin, exposure to strep, and dull ear drum will rx azithromycin.  For htn will rx losartan 50 mg a day.  Your subconjunctival hemorrhage should absorb with time.2-3 week estimate.  Follow up in 7 days or as needed

## 2017-04-06 ENCOUNTER — Other Ambulatory Visit: Payer: Self-pay | Admitting: Family Medicine

## 2017-04-07 ENCOUNTER — Encounter: Payer: Self-pay | Admitting: Internal Medicine

## 2017-04-07 ENCOUNTER — Ambulatory Visit (AMBULATORY_SURGERY_CENTER): Payer: Self-pay | Admitting: *Deleted

## 2017-04-07 VITALS — Ht 67.5 in | Wt 186.4 lb

## 2017-04-07 DIAGNOSIS — Z86010 Personal history of colonic polyps: Secondary | ICD-10-CM

## 2017-04-07 MED ORDER — NA SULFATE-K SULFATE-MG SULF 17.5-3.13-1.6 GM/177ML PO SOLN: 1.0000 | Freq: Once | ORAL | 0 refills | Status: AC

## 2017-04-07 NOTE — Progress Notes (Signed)
No egg or soy allergy known to patient  No issues with past sedation with any surgeries  or procedures, no intubation problems  No diet pills per patient No home 02 use per patient  No blood thinners per patient  Pt denies issues with constipation  No A fib or A flutter  EMMI video sent to pt's e mail  Pt has been having irregular heart beats- has OV with cardio 04-18-17- instructed pt after this cardio OV, she needs to call and inform us of his plan- may need to cancel colon if further cardiology work up is needed. Pt verbalized understanding

## 2017-04-08 ENCOUNTER — Encounter: Payer: Self-pay | Admitting: Medical

## 2017-04-08 ENCOUNTER — Ambulatory Visit: Payer: Medicare Other | Admitting: Medical

## 2017-04-08 ENCOUNTER — Ambulatory Visit (INDEPENDENT_AMBULATORY_CARE_PROVIDER_SITE_OTHER): Payer: Medicare Other | Admitting: Medical

## 2017-04-08 VITALS — BP 131/54 | HR 82 | Temp 98.1°F | Resp 16 | Ht 67.0 in | Wt 183.8 lb

## 2017-04-08 DIAGNOSIS — R002 Palpitations: Secondary | ICD-10-CM | POA: Diagnosis not present

## 2017-04-08 DIAGNOSIS — I10 Essential (primary) hypertension: Secondary | ICD-10-CM

## 2017-04-08 DIAGNOSIS — H938X3 Other specified disorders of ear, bilateral: Secondary | ICD-10-CM

## 2017-04-08 DIAGNOSIS — J301 Allergic rhinitis due to pollen: Secondary | ICD-10-CM

## 2017-04-08 MED ORDER — LOSARTAN POTASSIUM 50 MG PO TABS: 50.0000 mg | ORAL_TABLET | Freq: Every day | ORAL | 0 refills | Status: DC

## 2017-04-08 MED ORDER — OLOPATADINE HCL 0.1 % OP SOLN: 1.0000 [drp] | Freq: Two times a day (BID) | OPHTHALMIC | 3 refills | Status: DC

## 2017-04-08 NOTE — Patient Instructions (Addendum)
Your bp is better than your readings you had before last visit. Continue losartan.  Your heart sounds to be in good rhythm now on exam. Cardiologist might order holter monitor to determine if palpitation can be captured. Still minimize or stop caffeine use.   For ear pressure use flonase. This appears most likley to be eustachian tube pressure.  You may have allergic conjunctivitis symptom as well. Can continue your oral anithistamine and will rx patanol eye drops.  Subconjunctival hemorrhage resolved.  Follow up 2-3 months with pcp or as needed

## 2017-04-08 NOTE — Progress Notes (Signed)
Pre visit review using our clinic review tool, if applicable. No additional management support is needed unless otherwise documented below in the visit note. 

## 2017-04-08 NOTE — Progress Notes (Signed)
Subjective:    Patient ID: Stacy Ayers, female    DOB: 1941-07-31, 76 y.o.   MRN: [id number]  HPI  Pt in for follow up.  Pt bp is better than her readings. Not having any significant ha. No gross motor or sensory function deficits. Pt on losartan daily. Pt at home her bp reading have been controlled mostly.  Pt states she has felt palpitation/skipped heart sensation 3 times since last week. She has appointment with cardiologist on Apr 18, 2017. Each event lasted 5-8 minutes(maybe less). Pt has cut back on coffee. Only on one cup a day now. Was on 3 cups.   Pt eyes area little red at times. Pt sinus pressure eased up. Faint pressure left sinus at time. Some ear pressure. Pt did take azithrornycin.    Throat pain resolved.    Review of Systems  HENT: Positive for ear pain and sinus pressure.        Ear pressure.  Eyes: Positive for itching.       At times gritty feel.  Mild red at times.  Respiratory: Negative for cough, chest tightness and wheezing.   Cardiovascular: Negative for chest pain and palpitations.       No palpitations presently..  Gastrointestinal: Negative for abdominal pain.  Musculoskeletal: Negative for back pain and myalgias.  Neurological: Negative for dizziness, speech difficulty, weakness and headaches.  Hematological: Negative for adenopathy. Does not bruise/bleed easily.  Psychiatric/Behavioral: Negative for behavioral problems and confusion.    Past Medical History:  Diagnosis Date  . Abnormal liver function tests   . Allergy   . Anxiety   . Arthritis    knees, lower back   . Asthma   . Cataract    left eye growing cataract   . Diabetes mellitus    had gastric bypass DM resolved-   . Diverticulosis   . GERD (gastroesophageal reflux disease)   . Heart murmur   . History of colon polyps    adenomatous  . Hyperlipidemia    past hx- resloved after gastric bypass   . Hypertension   . Hypothyroidism   . IBS (irritable bowel syndrome)   .  Iron deficiency anemia   . Osteoporosis      Social History   Social History  . Marital status: Single    Spouse name: N/A  . Number of children: 1  . Years of education: N/A   Occupational History  . retired Pharmacist, hospital   .  Retired   Social History Main Topics  . Smoking status: Former Smoker    Packs/day: 0.30    Years: 2.00    Types: Cigarettes    Quit date: 07/11/1962  . Smokeless tobacco: Never Used  . Alcohol use 8.4 oz/week    14 Glasses of wine per week  . Drug use: No  . Sexual activity: Not Currently   Other Topics Concern  . Not on file   Social History Narrative   Daily caffeine    Past Surgical History:  Procedure Laterality Date  . ABDOMINAL HYSTERECTOMY    . ABDOMINAL HYSTERECTOMY    . APPENDECTOMY    . BLADDER REPAIR    . CHOLECYSTECTOMY    . COLONOSCOPY    . CORONARY ANGIOPLASTY    . GASTRIC BYPASS    . POLYPECTOMY    . TONSILLECTOMY AND ADENOIDECTOMY      Family History  Problem Relation Age of Onset  . Diabetes Sister   . Heart disease Maternal  Grandmother   . Stroke Mother   . Colon cancer Neg Hx   . Colon polyps Neg Hx   . Esophageal cancer Neg Hx   . Rectal cancer Neg Hx   . Stomach cancer Neg Hx     Allergies  Allergen Reactions  . Sulfonamide Derivatives Diarrhea  . Codeine   . Doxycycline     REACTION: GI UPSET  . Montelukast Sodium   . Penicillins   . Pioglitazone   . Rosiglitazone Maleate   . Secobarbital Sodium     Current Outpatient Prescriptions on File Prior to Visit  Medication Sig Dispense Refill  . acetaminophen (TYLENOL) 650 MG CR tablet Take 650 mg by mouth every 8 (eight) hours as needed for pain.    Marland Kitchen alendronate (FOSAMAX) 70 MG tablet Take 1 tablet (70 mg total) by mouth once a week. Take with a full glass of water on an empty stomach. 4 tablet 11  . B-COMPLEX-C PO Take 1 tablet by mouth daily.    . benzonatate (TESSALON) 100 MG capsule     . cholecalciferol (VITAMIN D) 1000 UNITS tablet Take 1,000  Units by mouth daily.      . cyanocobalamin (,VITAMIN B-12,) 1000 MCG/ML injection Inject 1 mL (1,000 mcg total) into the muscle every 30 (thirty) days. 3 mL 4  . fluticasone (FLONASE) 50 MCG/ACT nasal spray Place 2 sprays into both nostrils daily. 16 g 1  . Inulin (FIBER CHOICE FRUITY BITES PO) Take 2 tablets by mouth daily.    Marland Kitchen loperamide (IMODIUM) 2 MG capsule Take 2 mg by mouth as needed. 2 in the morning and 2 in the afternoon    . loratadine (CLARITIN) 10 MG tablet Take 1 tablet (10 mg total) by mouth daily. 30 tablet 11  . pantoprazole (PROTONIX) 40 MG tablet TAKE 1 TABLET ONCE DAILY. 90 tablet 0  . PROAIR HFA 108 (90 Base) MCG/ACT inhaler USE 2 PUFFS EVERY 6 HOURS AS NEEDED. 8.5 g 0  . Probiotic Product (ACIDOPHILUS PROBIOTIC BLEND PO) Take by mouth.      . Simethicone (GAS-X EXTRA STRENGTH) 62.5 MG STRP Take by mouth. As needed     . SYMBICORT 160-4.5 MCG/ACT inhaler USE 2 PUFFS TWICE A DAY. 10.2 g 0  . SYNTHROID 125 MCG tablet TAKE 1 TABLET EACH DAY. 30 tablet 3  . venlafaxine XR (EFFEXOR-XR) 75 MG 24 hr capsule TAKE 1 CAPSULE EVERY DAY. 90 capsule 0   No current facility-administered medications on file prior to visit.     BP (!) 131/54 (BP Location: Right Arm, Patient Position: Sitting, Cuff Size: Large)   Pulse 82   Temp 98.1 F (36.7 C) (Oral)   Resp 16   Ht 5\' 7"  (1.702 m)   Wt 183 lb 12.8 oz (83.4 kg)   SpO2 98%   BMI 28.79 kg/m       Objective:   Physical Exam   General  Mental Status - Alert. General Appearance - Well groomed. Not in acute distress.  Skin Rashes- No Rashes.  HEENT Head- Normal. Ear Auditory Canal - Left- Normal. Right - Normal.Tympanic Membrane- Left- Normal. Right- Normal. Eye Sclera/Conjunctiva- Left- Normal. Right- Normal. Nose & Sinuses Nasal Mucosa- Left-  Boggy and Congested. Right-  Boggy and  Congested.Bilateral maxillary and frontal sinus pressure. Mouth & Throat Lips: Upper Lip- Normal: no dryness, cracking, pallor,  cyanosis, or vesicular eruption. Lower Lip-Normal: no dryness, cracking, pallor, cyanosis or vesicular eruption. Buccal Mucosa- Bilateral- No Aphthous ulcers. Oropharynx- No  Discharge or Erythema. Tonsils: Characteristics- Bilateral- No Erythema or Congestion. Size/Enlargement- Bilateral- No enlargement. Discharge- bilateral-None.  Neck Neck- Supple. No Masses.   Chest and Lung Exam Auscultation: Breath Sounds:-Clear even and unlabored.  Cardiovascular Auscultation:Rythm- Regular, rate and rhythm. Murmurs & Other Heart Sounds:Ausculatation of the heart reveal- No Murmurs.  Lymphatic Head & Neck General Head & Neck Lymphatics: Bilateral: Description- No Localized lymphadenopathy.      Assessment & Plan:  Your bp is better than your readings you had before last visit. Continue losartan.  Your heart sounds to be in good rhythm now on exam. Cardiologist might order holter monitor to determine if palpitation can be captured. Still minimize or stop caffeine use.   For ear pressure use flonase. This appears most likley to be eustachian tube pressure.  You may have allergic conjunctivitis symptom as well. Can continue your oral anithistamine and will rx patanol eye drops.  Subconjunctival hemorrhage resolved.  Follow up 2-3 months with pcp or as needed

## 2017-04-18 ENCOUNTER — Ambulatory Visit (INDEPENDENT_AMBULATORY_CARE_PROVIDER_SITE_OTHER): Payer: Medicare Other | Admitting: Cardiovascular Disease

## 2017-04-18 ENCOUNTER — Encounter: Payer: Self-pay | Admitting: Cardiovascular Disease

## 2017-04-18 VITALS — BP 116/66 | HR 77 | Ht 67.0 in | Wt 187.8 lb

## 2017-04-18 DIAGNOSIS — R002 Palpitations: Secondary | ICD-10-CM

## 2017-04-18 DIAGNOSIS — I872 Venous insufficiency (chronic) (peripheral): Secondary | ICD-10-CM | POA: Diagnosis not present

## 2017-04-18 DIAGNOSIS — J45909 Unspecified asthma, uncomplicated: Secondary | ICD-10-CM | POA: Diagnosis not present

## 2017-04-18 DIAGNOSIS — I10 Essential (primary) hypertension: Secondary | ICD-10-CM

## 2017-04-18 DIAGNOSIS — E039 Hypothyroidism, unspecified: Secondary | ICD-10-CM

## 2017-04-18 DIAGNOSIS — F419 Anxiety disorder, unspecified: Secondary | ICD-10-CM

## 2017-04-18 DIAGNOSIS — R945 Abnormal results of liver function studies: Secondary | ICD-10-CM

## 2017-04-18 DIAGNOSIS — R7989 Other specified abnormal findings of blood chemistry: Secondary | ICD-10-CM

## 2017-04-18 MED ORDER — METOPROLOL TARTRATE 25 MG PO TABS
ORAL_TABLET | ORAL | 3 refills | Status: DC
Start: 2017-04-18 — End: 2020-07-16

## 2017-04-18 NOTE — Patient Instructions (Signed)
Your physician recommends that you return for lab work TODAY.  Your physician has requested that you have an echocardiogram. Echocardiography is a painless test that uses sound waves to create images of your heart. It provides your doctor with information about the size and shape of your heart and how well your heart's chambers and valves are working. This procedure takes approximately one hour. There are no restrictions for this procedure.  This will be done at [facility] unless otherwise requested.  Your physician has recommended you make the following change in your medication:   1.) start metoprolol tartrate 25 mg as directed on the bottle. This has been sent to [facility].  Your physician recommends that you schedule a follow-up appointment  Only if needed.

## 2017-04-18 NOTE — Progress Notes (Signed)
Cardiology Office Note    Date:  04/25/2017   ID:  Stacy Ayers, DOB 08/01/1941, MRN [id number]  PCP:  Carollee Herter, Alferd Apa, DO  Cardiologist:  Shelva Majestic, MD   Chief Complaint  Patient presents with  . Irregular Heart Beat    History of Present Illness:  Stacy Ayers is a 76 y.o. female who is referred through the courtesy of Dorthey Sawyer, MD and Mackie Pai, [facility] for evaluation of palpitations.  Stacy Ayers has a history of hypertension, diabetes mellitus, and 11 years ago underwent Roux-en-Y gastric bypass surgery at [facility].  Her peak weight was 263 pounds, she lost 100 pounds, and subsequent gained back 25 pounds.  With this procedure, she was no longer diabetic.  She states that she's had a history of palpitations when she was younger and had thyroid abnormality.  At that time.  Recently, over the past month she has noticed recurrent palpitations.  She typically drinks 1 cup of coffee in the morning and then decaffeinated beverage.  Typically notes 5-10 beats and then a skip.  She denies any presyncopal presyncope.  These episodes of palpitations typically may last up to 5 minutes.  She typically goes to bed at 11:30 and wakes up at 6:30.  She is unaware of snoring.  She has a history of venous insufficiency and underwent laser surgery.  Over the past year she moved to independent living at [facility].  Because of her recurrent palpitations, she presents now for cardiology evaluation.  Additional history is also notable for anxiety, asthma, GERD, and hypothyroidism for which she takes Synthroid replacement.   Past Medical History:  Diagnosis Date  . Abnormal liver function tests   . Allergy   . Anxiety   . Arthritis    knees, lower back   . Asthma   . Cataract    left eye growing cataract   . Diabetes mellitus    had gastric bypass DM resolved-   . Diverticulosis   . GERD (gastroesophageal reflux disease)   . Heart murmur   . History of colon polyps    adenomatous  . Hyperlipidemia    past hx- resloved after gastric bypass   . Hypertension   . Hypothyroidism   . IBS (irritable bowel syndrome)   . Iron deficiency anemia   . Osteoporosis     Past Surgical History:  Procedure Laterality Date  . ABDOMINAL HYSTERECTOMY    . ABDOMINAL HYSTERECTOMY    . APPENDECTOMY    . BLADDER REPAIR    . CHOLECYSTECTOMY    . COLONOSCOPY    . CORONARY ANGIOPLASTY    . GASTRIC BYPASS    . POLYPECTOMY    . TONSILLECTOMY AND ADENOIDECTOMY      Current Medications: Outpatient Medications Prior to Visit  Medication Sig Dispense Refill  . acetaminophen (TYLENOL) 650 MG CR tablet Take 650 mg by mouth every 8 (eight) hours as needed for pain.    Marland Kitchen alendronate (FOSAMAX) 70 MG tablet Take 1 tablet (70 mg total) by mouth once a week. Take with a full glass of water on an empty stomach. 4 tablet 11  . B-COMPLEX-C PO Take 1 tablet by mouth daily.    . cholecalciferol (VITAMIN D) 1000 UNITS tablet Take 1,000 Units by mouth daily.      . cyanocobalamin (,VITAMIN B-12,) 1000 MCG/ML injection Inject 1 mL (1,000 mcg total) into the muscle every 30 (thirty) days. 3 mL 4  . fluticasone (FLONASE) 50 MCG/ACT  nasal spray Place 2 sprays into both nostrils daily. 16 g 1  . Inulin (FIBER CHOICE FRUITY BITES PO) Take 2 tablets by mouth daily.    Marland Kitchen loperamide (IMODIUM) 2 MG capsule Take 2 mg by mouth as needed. 2 in the morning and 2 in the afternoon    . loratadine (CLARITIN) 10 MG tablet Take 1 tablet (10 mg total) by mouth daily. 30 tablet 11  . losartan (COZAAR) 50 MG tablet Take 1 tablet (50 mg total) by mouth daily. 30 tablet 0  . olopatadine (PATANOL) 0.1 % ophthalmic solution Place 1 drop into both eyes 2 (two) times daily. 5 mL 3  . pantoprazole (PROTONIX) 40 MG tablet TAKE 1 TABLET ONCE DAILY. 90 tablet 0  . PROAIR HFA 108 (90 Base) MCG/ACT inhaler USE 2 PUFFS EVERY 6 HOURS AS NEEDED. 8.5 g 0  . Probiotic Product (ACIDOPHILUS PROBIOTIC BLEND PO) Take by mouth.       . Simethicone (GAS-X EXTRA STRENGTH) 62.5 MG STRP Take by mouth. As needed     . SYMBICORT 160-4.5 MCG/ACT inhaler USE 2 PUFFS TWICE A DAY. 10.2 g 0  . SYNTHROID 125 MCG tablet TAKE 1 TABLET EACH DAY. 30 tablet 3  . venlafaxine XR (EFFEXOR-XR) 75 MG 24 hr capsule TAKE 1 CAPSULE EVERY DAY. 90 capsule 0  . benzonatate (TESSALON) 100 MG capsule      No facility-administered medications prior to visit.      Allergies:   Sulfonamide derivatives; Codeine; Montelukast sodium; Penicillins; Pioglitazone; Rosiglitazone maleate; Secobarbital sodium; and Doxycycline   Social History   Social History  . Marital status: Single    Spouse name: N/A  . Number of children: 1  . Years of education: N/A   Occupational History  . retired Pharmacist, hospital   .  Retired   Social History Main Topics  . Smoking status: Former Smoker    Packs/day: 0.30    Years: 2.00    Types: Cigarettes    Quit date: 07/11/1962  . Smokeless tobacco: Never Used  . Alcohol use 8.4 oz/week    14 Glasses of wine per week  . Drug use: No  . Sexual activity: Not Currently   Other Topics Concern  . 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  She is divorced for 40 years.  Remotely she had smoked for very little and quit over 50 years ago.  She drinks occasional wine.  She enjoys swimming and swims 5 days per week  Family History:  The patient's family history includes Angina in her maternal grandmother; Cancer in her paternal grandmother; Diabetes in her sister; Heart disease in her maternal grandmother; Obesity in her father; Stroke in her mother; Suicidality in her maternal grandfather.  Her mother died at age 41, father died in auto accident age 13.  She has one sister who is deceased at age 96.  ROS General: Negative; No fevers, chills, or night sweats;  HEENT: Negative; No changes  in vision or hearing, sinus congestion, difficulty swallowing Pulmonary: Negative; No cough, wheezing, shortness of breath, hemoptysis Cardiovascular: See history of present illness GI: Negative; No nausea, vomiting, diarrhea, or abdominal pain GU: Negative; No dysuria, hematuria, or difficulty voiding Musculoskeletal: Negative; no myalgias, joint pain, or weakness Hematologic/Oncology: Negative; no easy bruising, bleeding Endocrine: Remote history of diabetes mellitus, which was cured following her  Roux-en-Y gastric bypass surgery; history of hypothyroidism  Neuro: Negative; no changes in balance, headaches Skin: Negative; No rashes or skin lesions Psychiatric: Negative; No behavioral problems, depression Sleep: Negative; No snoring, daytime sleepiness, hypersomnolence, bruxism, restless legs, hypnogognic hallucinations, no cataplexy Other comprehensive 14 point system review is negative.   PHYSICAL EXAM:   VS:  BP 116/66 (BP Location: Left Arm)   Pulse 77   Ht _0  (1.702 m)   Wt 187 lb 12.8 oz (85.2 kg)   BMI 29.41 kg/m     Repeat blood pressure by me 120/68.  Wt Readings from Last 3 Encounters:  04/18/17 187 lb 12.8 oz (85.2 kg)  04/08/17 183 lb 12.8 oz (83.4 kg)  04/07/17 186 lb 6.4 oz (84.6 kg)    General: Alert, oriented, no distress.  Skin: normal turgor, no rashes, warm and dry HEENT: Normocephalic, atraumatic. Pupils equal round and reactive to light; sclera anicteric; extraocular muscles intact; Fundi Without hemorrhages or exudates.  Disc flat Nose without nasal septal hypertrophy Mouth/Parynx benign; Mallinpatti scale 3 Neck: No JVD, no carotid bruits; normal carotid upstroke Lungs: clear to ausculatation and percussion; no wheezing or rales Chest wall: without tenderness to palpitation Heart: PMI not displaced, RRR, s1 s2 normal, 1/6 systolic murmur, no diastolic murmur, no rubs, gallops, thrills, or heaves Abdomen: soft, nontender; no hepatosplenomehaly, BS+;  abdominal aorta nontender and not dilated by palpation. Back: no CVA tenderness Pulses 2+ Musculoskeletal: full range of motion, normal strength, no joint deformities Extremities: Mild varicosities; no clubbing cyanosis or edema, Homan's sign negative  Neurologic: grossly nonfocal; Cranial nerves grossly wnl Psychologic: Normal mood and affect   Studies/Labs Reviewed:   EKG:  EKG is ordered today.  ECG (independently read by me):  Normal sinus rhthym at 78 bpm.  Normal intervals.  No ST segment changes.   I have also personally reviewed ECG that was done on Apr 01 2017 by PCP:  Normal sinus rhythm at 66 bpm.  Normal intervals.  No ST segment changes.  No ectopy  Recent Labs: BMP Latest Ref Rng & Units 04/18/2017 04/01/2017 08/06/2016  Glucose 65 - 99 mg/dL 99 93 107(H)  BUN 8 - 27 mg/dL _1 Creatinine 0.57 - 1.00 mg/dL 0.71 0.67 0.65  BUN/Creat Ratio 12 - 28 20 - -  Sodium 134 - 144 mmol/L 139 137 138  Potassium 3.5 - 5.2 mmol/L 4.9 4.2 4.1  Chloride 96 - 106 mmol/L 100 102 103  CO2 18 - 29 mmol/L _2 Calcium 8.7 - 10.3 mg/dL 10.0 9.6 9.1     Hepatic Function Latest Ref Rng & Units 04/18/2017 04/01/2017 08/06/2016  Total Protein 6.0 - 8.5 g/dL 7.2 6.9 7.1  Albumin 3.5 - 4.8 g/dL 4.5 4.2 4.3  AST 0 - 40 IU/L 35 52(H) 36  ALT 0 - 32 IU/L 41(H) 45(H) 36(H)  Alk Phosphatase 39 - 117 IU/L 63 48 53  Total Bilirubin 0.0 - 1.2 mg/dL 0.2 0.5 0.5  Bilirubin, Direct 0.0 - 0.3 mg/dL - - -    CBC Latest Ref Rng & Units 08/06/2016 03/11/2014 04/18/2013  WBC 4.0 - 10.5 K/uL 5.9 5.7 3.9(L)  Hemoglobin 12.0 - 15.0 g/dL 12.6 12.1 12.0  Hematocrit 36.0 - 46.0 % 36.3 35.7(L) 34.3(L)  Platelets 150.0 - 400.0 K/uL 243.0 193.0 161.0   Lab Results  Component Value Date   MCV 94.9 08/06/2016   MCV 94.7 03/11/2014   MCV 93.0 04/18/2013   Lab Results  Component Value  Date   TSH 2.86 04/01/2017   Lab Results  Component Value Date   HGBA1C 5.2 08/06/2016     BNP No results found for:  BNP  ProBNP No results found for: PROBNP   Lipid Panel     Component Value Date/Time   CHOL 172 08/06/2016 1122   TRIG 129.0 08/06/2016 1122   HDL 55.60 08/06/2016 1122   CHOLHDL 3 08/06/2016 1122   VLDL 25.8 08/06/2016 1122   LDLCALC 91 08/06/2016 1122     RADIOLOGY: No results found.   Additional studies/ records that were reviewed today include:  I reviewed the office records and laboratory from her primary physician.    ASSESSMENT:    1. Palpitations   2. Essential hypertension   3. Hypothyroidism, unspecified type   4. Uncomplicated asthma, unspecified asthma severity, unspecified whether persistent   5. Anxiety   6. LFT elevation   7. Venous insufficiency      PLAN:  Stacy Ayers is a very pleasant 72 -year-old female who presents with a one-month history of increasing palpitations.  She has a history of prior caffeine use and had been drinking up to 3 cups of caffeine per day.  Most recently, she is down to one cup per day.  She has a history of thyroid abnormality, and remotely during her younger years, had palpitations secondary to this.  Most recently, she has been maintained on levothyroxine at 125 g.  Her most recent TSH is normal.  She has a history of hypertension.  Her blood pressure today is stable on losartan 50 mg daily.  Her palpitations typically are short-lived, can occur several times per week and last for less than 5 minutes.  She denies any clear-cut exertional precipitation.  She denies associated presyncope or syncope.  She has a prior history of morbid obesity and underwent gastric bypass with a Roux-en-Y procedure, which essentially cured her diabetes mellitus following surgery.  She is mildly overweight today with a BMI of 29.4, one but is no longer obese.  She denies any symptoms of obstructive sleep apnea.  Recent laboratory was reviewed and has shown very mild LFT elevation.  She has GERD which is controlled with pantoprazole.  For her asthma she  has been on pro-air and Symbicort.  She does have anxiety for which he takes Effexor XR 75 mg daily.  Presently, I am recommending that she undergo a 2-D echo Doppler study to evaluate both systolic and diastolic function, valvular architecture, and chamber dimensions.  Her prescription for metoprolol 25 mg to take as needed for palpitations.  At present, I will not have her wear a monitor but if she continues to experience palpitations despite beta blocker therapy, this will be done.  I have recommended reduction of caffeine as much as possible.  I will contact her regarding her echo Doppler study and need for follow up evaluation.   Medication Adjustments/Labs and Tests Ordered: Current medicines are reviewed at length with the patient today.  Concerns regarding medicines are outlined above.  Medication changes, Labs and Tests ordered today are listed in the Patient Instructions below. Patient Instructions  Your physician recommends that you return for lab work TODAY.  Your physician has requested that you have an echocardiogram. Echocardiography is a painless test that uses sound waves to create images of your heart. It provides your doctor with information about the size and shape of your heart and how well your heart's chambers and valves are working. This procedure takes approximately  one hour. There are no restrictions for this procedure.  This will be done at [facility] unless otherwise requested.  Your physician has recommended you make the following change in your medication:   1.) start metoprolol tartrate 25 mg as directed on the bottle. This has been sent to [facility].  Your physician recommends that you schedule a follow-up appointment  Only if needed.       Signed, Shelva Majestic, MD  04/25/2017 6:14 PM    [facility] [address], [place], [place], [place]  [address] Phone: [phone]

## 2017-04-19 ENCOUNTER — Telehealth: Payer: Self-pay

## 2017-04-19 LAB — COMPREHENSIVE METABOLIC PANEL
A/G RATIO: 1.7 (ref 1.2–2.2)
ALK PHOS: 63 IU/L (ref 39–117)
ALT: 41 IU/L — AB (ref 0–32)
AST: 35 IU/L (ref 0–40)
Albumin: 4.5 g/dL (ref 3.5–4.8)
BILIRUBIN TOTAL: 0.2 mg/dL (ref 0.0–1.2)
BUN/Creatinine Ratio: 20 (ref 12–28)
BUN: 14 mg/dL (ref 8–27)
CO2: 26 mmol/L (ref 18–29)
Calcium: 10 mg/dL (ref 8.7–10.3)
Chloride: 100 mmol/L (ref 96–106)
Creatinine, Ser: 0.71 mg/dL (ref 0.57–1.00)
GFR calc non Af Amer: 85 mL/min/{1.73_m2} (ref >59)
GFR, EST AFRICAN AMERICAN: 98 mL/min/{1.73_m2} (ref >59)
GLUCOSE: 99 mg/dL (ref 65–99)
Globulin, Total: 2.7 g/dL (ref 1.5–4.5)
Potassium: 4.9 mmol/L (ref 3.5–5.2)
SODIUM: 139 mmol/L (ref 134–144)
Total Protein: 7.2 g/dL (ref 6.0–8.5)

## 2017-04-19 LAB — MAGNESIUM: MAGNESIUM: 2.2 mg/dL (ref 1.6–2.3)

## 2017-04-19 NOTE — Telephone Encounter (Signed)
Patient cancelled procedure.

## 2017-04-19 NOTE — Telephone Encounter (Signed)
Called Parke Poisson (pt's daughter) and left message we would need to cancel her mother"s colon on 04/21/17 until she gets cardiac clearance. Pt is scheduled for an Echo on 04/29/17. Unable to reach pt at home/cell number.

## 2017-04-19 NOTE — Telephone Encounter (Signed)
Colon was cx'd on 04/21/17 per pt. She will call back to reschedule her colon after she gets cardiac clearance.

## 2017-04-21 ENCOUNTER — Encounter: Payer: Medicare Other | Admitting: Internal Medicine

## 2017-04-25 ENCOUNTER — Encounter: Payer: Self-pay | Admitting: Cardiovascular Disease

## 2017-04-29 ENCOUNTER — Other Ambulatory Visit (HOSPITAL_COMMUNITY): Payer: Medicare Other

## 2017-05-14 ENCOUNTER — Other Ambulatory Visit: Payer: Self-pay | Admitting: Family Medicine

## 2017-05-14 DIAGNOSIS — K219 Gastro-esophageal reflux disease without esophagitis: Secondary | ICD-10-CM

## 2017-05-14 DIAGNOSIS — J302 Other seasonal allergic rhinitis: Secondary | ICD-10-CM

## 2017-05-30 ENCOUNTER — Other Ambulatory Visit: Payer: Self-pay | Admitting: Medical

## 2017-06-16 ENCOUNTER — Other Ambulatory Visit: Payer: Self-pay | Admitting: Family Medicine

## 2017-06-16 DIAGNOSIS — E538 Deficiency of other specified B group vitamins: Secondary | ICD-10-CM

## 2017-06-28 ENCOUNTER — Other Ambulatory Visit: Payer: Self-pay | Admitting: Medical

## 2017-06-30 NOTE — Telephone Encounter (Signed)
Pt due for follow up please call and schedule appointment.  

## 2017-07-01 NOTE — Telephone Encounter (Signed)
Called pt to schedule follow up with PCP. No answer. LVM for pt to return call to schedule.

## 2017-07-01 NOTE — Telephone Encounter (Signed)
My chart message sent to pt to schedule appt.

## 2017-08-02 ENCOUNTER — Telehealth: Payer: Self-pay | Admitting: *Deleted

## 2017-08-02 ENCOUNTER — Telehealth: Payer: Self-pay | Admitting: Family Medicine

## 2017-08-02 DIAGNOSIS — E538 Deficiency of other specified B group vitamins: Secondary | ICD-10-CM

## 2017-08-02 NOTE — Telephone Encounter (Signed)
Received Physician Orders for continued B12 monthly injections from [facility]; forwarded to provider/SLS 09/04

## 2017-08-02 NOTE — Telephone Encounter (Signed)
Order faxed. Awaiting fax confirmation.

## 2017-08-02 NOTE — Telephone Encounter (Signed)
Ok to give order? 

## 2017-08-02 NOTE — Telephone Encounter (Signed)
Please advise 

## 2017-08-02 NOTE — Telephone Encounter (Signed)
Caller name: Mliss Sax Relation to pt: Nurse Clinic from [facility] Call back number: [phone] Pharmacy:  Reason for call: Clinic is needing order for pt to continue B-12 shot 1,000 MCG monthly so that at the facility pt can get treatment for the shot mentioned above. Please Fax order to clinic with attention to nurse at [phone] (fax number). Mliss Sax also stated did send a note at our Fax on August 30,2018 regarding about order and still awaiting for order to be sent to the clinic. Please advise.

## 2017-08-02 NOTE — Telephone Encounter (Signed)
Fax confirmation received. 

## 2017-08-04 NOTE — Telephone Encounter (Signed)
I thought order they requested was to give shot monthly-- not lab

## 2017-08-04 NOTE — Telephone Encounter (Signed)
Caller name: Relation to pt:  Nurse Clinic from [facility]  Call back number: [phone] / fax # [phone]  Pharmacy:  Reason for call:  Dx code for b12 level of determination clarification order and in need of

## 2017-08-04 NOTE — Telephone Encounter (Addendum)
Called and spoke to [place].  She stated that she received another order yesterday from our office requesting that patient's B12 level be checked.  She was calling back to see when Dr. Carollee Herter would like B12 to be drawn, whether or not she would like for lab to be drawn at [facility] or in our office, and Dx code for lab if she would like B12 to be drawn at [facility].   Dr. Carmelina Noun advise.

## 2017-08-04 NOTE — Telephone Encounter (Addendum)
That is correct.  Bernadette wasn't sure why she received the request for lab.  I will call and let her know to disregard.   Called [place] and advised to disregard lab order.

## 2017-08-05 ENCOUNTER — Ambulatory Visit (INDEPENDENT_AMBULATORY_CARE_PROVIDER_SITE_OTHER): Payer: Medicare Other | Admitting: Family Medicine

## 2017-08-05 ENCOUNTER — Encounter: Payer: Self-pay | Admitting: Family Medicine

## 2017-08-05 VITALS — BP 128/76 | HR 81 | Temp 98.6°F | Ht 67.0 in | Wt 188.2 lb

## 2017-08-05 DIAGNOSIS — Z23 Encounter for immunization: Secondary | ICD-10-CM | POA: Diagnosis not present

## 2017-08-05 DIAGNOSIS — F418 Other specified anxiety disorders: Secondary | ICD-10-CM | POA: Diagnosis not present

## 2017-08-05 DIAGNOSIS — E538 Deficiency of other specified B group vitamins: Secondary | ICD-10-CM

## 2017-08-05 DIAGNOSIS — E785 Hyperlipidemia, unspecified: Secondary | ICD-10-CM | POA: Diagnosis not present

## 2017-08-05 DIAGNOSIS — E1139 Type 2 diabetes mellitus with other diabetic ophthalmic complication: Secondary | ICD-10-CM

## 2017-08-05 DIAGNOSIS — I10 Essential (primary) hypertension: Secondary | ICD-10-CM

## 2017-08-05 DIAGNOSIS — E039 Hypothyroidism, unspecified: Secondary | ICD-10-CM | POA: Diagnosis not present

## 2017-08-05 DIAGNOSIS — Z8639 Personal history of other endocrine, nutritional and metabolic disease: Secondary | ICD-10-CM

## 2017-08-05 DIAGNOSIS — K219 Gastro-esophageal reflux disease without esophagitis: Secondary | ICD-10-CM

## 2017-08-05 DIAGNOSIS — F419 Anxiety disorder, unspecified: Secondary | ICD-10-CM | POA: Diagnosis not present

## 2017-08-05 DIAGNOSIS — F329 Major depressive disorder, single episode, unspecified: Secondary | ICD-10-CM

## 2017-08-05 LAB — CBC WITH DIFFERENTIAL/PLATELET
BASOS PCT: 0.9 % (ref 0.0–3.0)
Basophils Absolute: 0 10*3/uL (ref 0.0–0.1)
Eosinophils Absolute: 0.1 10*3/uL (ref 0.0–0.7)
Eosinophils Relative: 2.4 % (ref 0.0–5.0)
HCT: 36.6 % (ref 36.0–46.0)
Hemoglobin: 12.3 g/dL (ref 12.0–15.0)
LYMPHS ABS: 1.2 10*3/uL (ref 0.7–4.0)
Lymphocytes Relative: 25.2 % (ref 12.0–46.0)
MCHC: 33.6 g/dL (ref 30.0–36.0)
MCV: 99.4 fl (ref 78.0–100.0)
Monocytes Absolute: 0.3 10*3/uL (ref 0.1–1.0)
Monocytes Relative: 7.1 % (ref 3.0–12.0)
NEUTROS ABS: 3.2 10*3/uL (ref 1.4–7.7)
NEUTROS PCT: 64.4 % (ref 43.0–77.0)
Platelets: 190 10*3/uL (ref 150.0–400.0)
RBC: 3.69 Mil/uL — ABNORMAL LOW (ref 3.87–5.11)
RDW: 13.5 % (ref 11.5–15.5)
WBC: 4.9 10*3/uL (ref 4.0–10.5)

## 2017-08-05 LAB — POC URINALSYSI DIPSTICK (AUTOMATED)
Bilirubin, UA: NEGATIVE
Blood, UA: NEGATIVE
Glucose, UA: NEGATIVE
KETONES UA: NEGATIVE
Leukocytes, UA: NEGATIVE
Nitrite, UA: NEGATIVE
PH UA: 6 (ref 5.0–8.0)
PROTEIN UA: NEGATIVE
Urobilinogen, UA: 0.2 E.U./dL

## 2017-08-05 LAB — COMPREHENSIVE METABOLIC PANEL
ALT: 38 U/L — ABNORMAL HIGH (ref 0–35)
AST: 41 U/L — ABNORMAL HIGH (ref 0–37)
Albumin: 4.2 g/dL (ref 3.5–5.2)
Alkaline Phosphatase: 60 U/L (ref 39–117)
BUN: 14 mg/dL (ref 6–23)
CALCIUM: 9.7 mg/dL (ref 8.4–10.5)
CO2: 29 meq/L (ref 19–32)
CREATININE: 0.7 mg/dL (ref 0.40–1.20)
Chloride: 103 mEq/L (ref 96–112)
GFR: 86.89 mL/min (ref >60.00)
Glucose, Bld: 117 mg/dL — ABNORMAL HIGH (ref 70–99)
POTASSIUM: 4 meq/L (ref 3.5–5.1)
Sodium: 139 mEq/L (ref 135–145)
TOTAL PROTEIN: 6.7 g/dL (ref 6.0–8.3)
Total Bilirubin: 0.4 mg/dL (ref 0.2–1.2)

## 2017-08-05 LAB — TSH: TSH: 3.16 u[IU]/mL (ref 0.35–4.50)

## 2017-08-05 LAB — LIPID PANEL
CHOL/HDL RATIO: 4
Cholesterol: 199 mg/dL (ref 0–200)
HDL: 50.8 mg/dL (ref >39.00)
NONHDL: 148.19
TRIGLYCERIDES: 281 mg/dL — AB (ref 0.0–149.0)
VLDL: 56.2 mg/dL — ABNORMAL HIGH (ref 0.0–40.0)

## 2017-08-05 LAB — VITAMIN B12: Vitamin B-12: 554 pg/mL (ref 211–911)

## 2017-08-05 LAB — HEMOGLOBIN A1C: HEMOGLOBIN A1C: 5.5 % (ref 4.6–6.5)

## 2017-08-05 LAB — LDL CHOLESTEROL, DIRECT: Direct LDL: 116 mg/dL

## 2017-08-05 MED ORDER — LEVOTHYROXINE SODIUM 125 MCG PO TABS: ORAL_TABLET | ORAL | 3 refills | Status: DC

## 2017-08-05 MED ORDER — PANTOPRAZOLE SODIUM 40 MG PO TBEC
40.0000 mg | DELAYED_RELEASE_TABLET | Freq: Every day | ORAL | 3 refills | Status: DC
Start: 2017-08-05 — End: 2018-10-02

## 2017-08-05 MED ORDER — VENLAFAXINE HCL ER 75 MG PO CP24: 75.0000 mg | ORAL_CAPSULE | Freq: Every day | ORAL | 3 refills | Status: DC

## 2017-08-05 MED ORDER — LOSARTAN POTASSIUM 50 MG PO TABS: 50.0000 mg | ORAL_TABLET | Freq: Every day | ORAL | 1 refills | Status: DC

## 2017-08-05 NOTE — Patient Instructions (Signed)

## 2017-08-05 NOTE — Progress Notes (Signed)
Patient ID: Stacy Ayers, female    DOB: [birth year]  Age: 76 y.o. MRN: [id number]    Subjective:  Subjective  HPI Stacy Ayers presents for f/u dm, htn , lipids and thyroid. She also needs med refills   No complaints Review of Systems  Constitutional: Negative for appetite change, diaphoresis, fatigue and unexpected weight change.  Eyes: Negative for pain, redness and visual disturbance.  Respiratory: Negative for cough, chest tightness, shortness of breath and wheezing.   Cardiovascular: Negative for chest pain, palpitations and leg swelling.  Endocrine: Negative for cold intolerance, heat intolerance, polydipsia, polyphagia and polyuria.  Genitourinary: Negative for difficulty urinating, dysuria and frequency.  Neurological: Negative for dizziness, light-headedness, numbness and headaches.    History Past Medical History:  Diagnosis Date  . Abnormal liver function tests   . Allergy   . Anxiety   . Arthritis    knees, lower back   . Asthma   . Cataract    left eye growing cataract   . Diabetes mellitus    had gastric bypass DM resolved-   . Diverticulosis   . GERD (gastroesophageal reflux disease)   . Heart murmur   . History of colon polyps    adenomatous  . Hyperlipidemia    past hx- resloved after gastric bypass   . Hypertension   . Hypothyroidism   . IBS (irritable bowel syndrome)   . Iron deficiency anemia   . Osteoporosis     She has a past surgical history that includes Abdominal hysterectomy; Appendectomy; Gastric bypass; Coronary angioplasty; Cholecystectomy; Tonsillectomy and adenoidectomy; Bladder repair; Abdominal hysterectomy; Colonoscopy; Polypectomy; and Cataract extraction (Left).   Her family history includes Angina in her maternal grandmother; Cancer in her paternal grandmother; Diabetes in her sister; Heart disease in her maternal grandmother; Obesity in her father; Stroke in her mother; Suicidality in her maternal grandfather.She reports that she  quit smoking about 55 years ago. Her smoking use included Cigarettes. She has a 0.60 pack-year smoking history. She has never used smokeless tobacco. She reports that she drinks about 8.4 oz of alcohol per week . She reports that she does not use drugs.  Current Outpatient Prescriptions on File Prior to Visit  Medication Sig Dispense Refill  . acetaminophen (TYLENOL) 650 MG CR tablet Take 650 mg by mouth every 8 (eight) hours as needed for pain.    Marland Kitchen alendronate (FOSAMAX) 70 MG tablet Take 1 tablet (70 mg total) by mouth once a week. Take with a full glass of water on an empty stomach. 4 tablet 11  . B-COMPLEX-C PO Take 1 tablet by mouth daily.    . cholecalciferol (VITAMIN D) 1000 UNITS tablet Take 1,000 Units by mouth daily.      . cyanocobalamin (,VITAMIN B-12,) 1000 MCG/ML injection INJECT 1ML INTO MUSCLE EVERY 30 DAYS 3 mL 0  . fluticasone (FLONASE) 50 MCG/ACT nasal spray Place 2 sprays into both nostrils daily. 16 g 1  . loperamide (IMODIUM) 2 MG capsule Take 2 mg by mouth as needed. 2 in the morning and 2 in the afternoon    . loratadine (CLARITIN) 10 MG tablet TAKE 1 TABLET ONCE DAILY. 30 tablet 0  . metoprolol tartrate (LOPRESSOR) 25 MG tablet Take 1-2 tablets by mouth as needed for palpitations. 30 tablet 3  . olopatadine (PATANOL) 0.1 % ophthalmic solution Place 1 drop into both eyes 2 (two) times daily. 5 mL 3  . PROAIR HFA 108 (90 Base) MCG/ACT inhaler USE 2 PUFFS EVERY 6  HOURS AS NEEDED. 8.5 g 0  . Probiotic Product (ACIDOPHILUS PROBIOTIC BLEND PO) Take by mouth.      . Simethicone (GAS-X EXTRA STRENGTH) 62.5 MG STRP Take by mouth. As needed     . SYMBICORT 160-4.5 MCG/ACT inhaler USE 2 PUFFS TWICE A DAY. 10.2 g 0  . Inulin (FIBER CHOICE FRUITY BITES PO) Take 2 tablets by mouth daily.     No current facility-administered medications on file prior to visit.      Objective:  Objective  Physical Exam  Constitutional: She is oriented to person, place, and time. She appears  well-developed and well-nourished.  HENT:  Head: Normocephalic and atraumatic.  Eyes: Conjunctivae and EOM are normal.  Neck: Normal range of motion. Neck supple. No JVD present. Carotid bruit is not present. No thyromegaly present.  Cardiovascular: Normal rate, regular rhythm and normal heart sounds.   No murmur heard. Pulmonary/Chest: Effort normal and breath sounds normal. No respiratory distress. She has no wheezes. She has no rales. She exhibits no tenderness.  Musculoskeletal: She exhibits no edema.  Neurological: She is alert and oriented to person, place, and time.  Psychiatric: She has a normal mood and affect.  Nursing note and vitals reviewed.  BP 128/76 (BP Location: Left Arm, Patient Position: Sitting, Cuff Size: Normal)   Pulse 81   Temp 98.6 F (37 C) (Oral)   Ht 5\' 7"  (1.702 m)   Wt 188 lb 3.2 oz (85.4 kg)   SpO2 96%   BMI 29.48 kg/m  Wt Readings from Last 3 Encounters:  08/05/17 188 lb 3.2 oz (85.4 kg)  04/18/17 187 lb 12.8 oz (85.2 kg)  04/08/17 183 lb 12.8 oz (83.4 kg)     Lab Results  Component Value Date   WBC 4.9 08/05/2017   HGB 12.3 08/05/2017   HCT 36.6 08/05/2017   PLT 190.0 08/05/2017   GLUCOSE 117 (H) 08/05/2017   CHOL 199 08/05/2017   TRIG 281.0 (H) 08/05/2017   HDL 50.80 08/05/2017   LDLDIRECT 116.0 08/05/2017   LDLCALC 91 08/06/2016   ALT 38 (H) 08/05/2017   AST 41 (H) 08/05/2017   NA 139 08/05/2017   K 4.0 08/05/2017   CL 103 08/05/2017   CREATININE 0.70 08/05/2017   BUN 14 08/05/2017   CO2 29 08/05/2017   TSH 3.16 08/05/2017   INR Positive 04/01/2017   HGBA1C 5.5 08/05/2017    Dg Bone Density  Result Date: 12/31/2016 EXAM: DUAL X-RAY ABSORPTIOMETRY (DXA) FOR BONE MINERAL DENSITY IMPRESSION: Referring Physician:  Rosalita Ayers CHASE PATIENT: Name: Stacy Ayers Patient ID: [id number] Birth Date: May 19, 1941 Height: 67.5 in. Sex: Female Measured: 12/31/2016 Weight: 182.8 lbs. Indications: Advanced Age, Bilateral Ovariectomy  (65.51), Caucasian, Estrogen Deficient, Hypothyroid, Hysterectomy, Low Calcium Intake (269.3), Postmenopausal, Prilosec, Synthroid Fractures: None Treatments: None ASSESSMENT: The BMD measured at Femur Neck Left is 0.671 g/cm2 with a T-score of -2.6. This patient is considered OSTEOPOROTIC according to [facility]) criteria. L-3 and L-4 were excluded due to degenerative changes. Site Region Measured Date Measured Age YA T-score BMD Significant CHANGE DualFemur Neck Left 12/31/2016    73.5         -2.6    0.671 g/cm2 AP Spine  L1-L2     12/31/2016    73.5         -1.5    0.991 g/cm2 World Health Organization [facility]) criteria for post-menopausal, Caucasian Women: Normal       T-score at or above -1 SD  Osteopenia   T-score between -1 and -2.5 SD Osteoporosis T-score at or below -2.5 SD RECOMMENDATION: [place] recommends that FDA-approved medical therapies be considered in postmenopausal women and men age 25 or older with a: 1. Hip or vertebral (clinical or morphometric) fracture. 2. T-score of <-2.5 at the spine or hip. 3. Ten-year fracture probability by FRAX of 3% or greater for hip fracture or 20% or greater for major osteoporotic fracture. All treatment decisions require clinical judgment and consideration of individual patient factors, including patient preferences, co-morbidities, previous drug use, risk factors not captured in the FRAX model (e.g. falls, vitamin D deficiency, increased bone turnover, interval significant decline in bone density) and possible under - or over-estimation of fracture risk by FRAX. All patients should ensure an adequate intake of dietary calcium (1200 mg/d) and vitamin D (800 IU daily) unless contraindicated. FOLLOW-UP: People with diagnosed cases of osteoporosis or at high risk for fracture should have regular bone mineral density tests. For patients eligible for Medicare, routine testing is allowed once every 2 years. The testing frequency  can be increased to one year for patients who have rapidly progressing disease, those who are receiving or discontinuing medical therapy to restore bone mass, or have additional risk factors. I have reviewed this report, and agree with the above findings. Mark A. Thornton Papas, M.D. [facility] Radiology Electronically Signed   By: Lavonia Dana M.D.   On: 12/31/2016 11:11   Mm Digital Screening Bilateral  Result Date: 12/31/2016 CLINICAL DATA:  Screening. EXAM: DIGITAL SCREENING BILATERAL MAMMOGRAM WITH CAD COMPARISON:  Previous exam(s). ACR Breast Density Category b: There are scattered areas of fibroglandular density. FINDINGS: There are no findings suspicious for malignancy. Images were processed with CAD. IMPRESSION: No mammographic evidence of malignancy. A result letter of this screening mammogram will be mailed directly to the patient. RECOMMENDATION: Screening mammogram in one year. (Code:SM-B-01Y) BI-RADS CATEGORY  1: Negative. Electronically Signed   By: Margarette Canada M.D.   On: 12/31/2016 13:18     Assessment & Plan:  Plan  I have changed Ms. Lavine's SYNTHROID to levothyroxine. I have also changed her venlafaxine XR, pantoprazole, and losartan. I am also having her maintain her Simethicone, loperamide, cholecalciferol, Probiotic Product (ACIDOPHILUS PROBIOTIC BLEND PO), acetaminophen, Inulin (FIBER CHOICE FRUITY BITES PO), B-COMPLEX-C PO, alendronate, SYMBICORT, PROAIR HFA, fluticasone, olopatadine, metoprolol tartrate, loratadine, and cyanocobalamin.  Meds ordered this encounter  Medications  . venlafaxine XR (EFFEXOR-XR) 75 MG 24 hr capsule    Sig: Take 1 capsule (75 mg total) by mouth daily.    Dispense:  90 capsule    Refill:  3  . levothyroxine (SYNTHROID) 125 MCG tablet    Sig: TAKE 1 TABLET EACH DAY.    Dispense:  90 tablet    Refill:  3  . pantoprazole (PROTONIX) 40 MG tablet    Sig: Take 1 tablet (40 mg total) by mouth daily.    Dispense:  90 tablet    Refill:  3  . losartan (COZAAR)  50 MG tablet    Sig: Take 1 tablet (50 mg total) by mouth daily.    Dispense:  90 tablet    Refill:  1    Problem List Items Addressed This Visit      Unprioritized   B12 deficiency   Relevant Orders   Vitamin B12 (Completed)   Esophageal reflux   Relevant Medications   pantoprazole (PROTONIX) 40 MG tablet   Depression with anxiety    Stable con't effexor  Essential hypertension - Primary    Well controlled, no changes to meds. Encouraged heart healthy diet such as the DASH diet and exercise as tolerated.       Relevant Medications   losartan (COZAAR) 50 MG tablet   Other Relevant Orders   Lipid panel (Completed)   Comprehensive metabolic panel (Completed)   Hemoglobin A1c (Completed)   CBC with Differential/Platelet (Completed)   POCT Urinalysis Dipstick (Automated) (Completed)   History of diet-controlled diabetes    con't diet       Hyperlipidemia LDL goal <100    Tolerating statin, encouraged heart healthy diet, avoid trans fats, minimize simple carbs and saturated fats. Increase exercise as tolerated      Relevant Medications   losartan (COZAAR) 50 MG tablet   Hypothyroidism    Check labs con't synthroid      Relevant Medications   levothyroxine (SYNTHROID) 125 MCG tablet   Other Relevant Orders   TSH (Completed)    Other Visit Diagnoses    Anxiety and depression       Relevant Medications   venlafaxine XR (EFFEXOR-XR) 75 MG 24 hr capsule   Type 2 diabetes mellitus with other ophthalmic complication, without long-term current use of insulin (HCC)       Relevant Medications   losartan (COZAAR) 50 MG tablet   Other Relevant Orders   Hemoglobin A1c (Completed)      Follow-up: Return in about 6 months (around 02/02/2018) for hypertension, hyperlipidemia, diabetes II, annual exam.  [name], DO

## 2017-08-07 NOTE — Assessment & Plan Note (Signed)
Stable  con't effexor  

## 2017-08-07 NOTE — Assessment & Plan Note (Signed)
- 

## 2017-08-07 NOTE — Assessment & Plan Note (Signed)
Check labs con't synthroid 

## 2017-08-07 NOTE — Assessment & Plan Note (Signed)
Tolerating statin, encouraged heart healthy diet, avoid trans fats, minimize simple carbs and saturated fats. Increase exercise as tolerated 

## 2017-08-07 NOTE — Assessment & Plan Note (Signed)
Well controlled, no changes to meds. Encouraged heart healthy diet such as the DASH diet and exercise as tolerated.  °

## 2017-09-05 ENCOUNTER — Telehealth: Payer: Self-pay | Admitting: *Deleted

## 2017-09-05 NOTE — Telephone Encounter (Signed)
Received Provider Query from [facility] for Medical Record Clarification on Depression for Coding purposes, OV note attached; forwarded to provider/SLS 10/08

## 2017-09-09 ENCOUNTER — Telehealth: Payer: Self-pay | Admitting: Family Medicine

## 2017-09-09 NOTE — Telephone Encounter (Signed)
Prolia benefits verified NO PA required Prolia subject to $50 copay Admin subject to $40 copay  Patient may owe approximately $90 OOP

## 2017-09-13 NOTE — Telephone Encounter (Signed)
Called left message to call back 

## 2017-09-13 NOTE — Telephone Encounter (Signed)
Patient called back and did agree to cost Scheduled a nurse visit appointment for 09/28/2017 at 3:30 Notified Gilmore Laroche to order her shot.

## 2017-09-16 NOTE — Telephone Encounter (Signed)
Nurse visit scheduled already.

## 2017-09-16 NOTE — Telephone Encounter (Signed)
Prolia has been received.

## 2017-09-28 ENCOUNTER — Ambulatory Visit (INDEPENDENT_AMBULATORY_CARE_PROVIDER_SITE_OTHER): Payer: Medicare Other

## 2017-09-28 DIAGNOSIS — Z7901 Long term (current) use of anticoagulants: Secondary | ICD-10-CM

## 2017-09-28 MED ORDER — DENOSUMAB 60 MG/ML ~~LOC~~ SOLN
60.0000 mg | Freq: Once | SUBCUTANEOUS | Status: AC
  Administered 2017-09-28: 60 mg via SUBCUTANEOUS

## 2017-09-28 NOTE — Progress Notes (Signed)
Pre visit review using our clinic tool,if applicable. No additional management support is needed unless otherwise documented below in the visit note.   Patient in for Prolia injection per order from [facility] dated 09/06/17.  Patient given 60 mg SQ left arm. No complaints voiced

## 2017-09-29 ENCOUNTER — Telehealth: Payer: Self-pay | Admitting: Family Medicine

## 2017-09-29 NOTE — Telephone Encounter (Signed)
[facility] requesting new order for B12. Fax [phone] attn Mliss Sax

## 2017-09-29 NOTE — Telephone Encounter (Signed)
Orders faxed

## 2017-09-29 NOTE — Telephone Encounter (Signed)
Ok to give order for 6 months

## 2017-10-11 ENCOUNTER — Other Ambulatory Visit: Payer: Self-pay | Admitting: Family Medicine

## 2017-10-11 DIAGNOSIS — E538 Deficiency of other specified B group vitamins: Secondary | ICD-10-CM

## 2017-10-11 DIAGNOSIS — J302 Other seasonal allergic rhinitis: Secondary | ICD-10-CM

## 2017-10-17 NOTE — Progress Notes (Deleted)
Subjective:   Stacy Ayers is a 76 y.o. female who presents for Medicare Annual (Subsequent) preventive examination.  Review of Systems:  No ROS.  Medicare Wellness Visit. Additional risk factors are reflected in the social history.    Sleep patterns:   Female:   Pap-  Last 08/06/16: normal     Mammo- last 12/31/16: BI-RADS CATEGORY  1: Negative.     Dexa scan- last 12/31/16: osteoporosis         CCS- 01/21/11: recall 5 yrs     Objective:     Vitals: There were no vitals taken for this visit.  There is no height or weight on file to calculate BMI.   Tobacco Social History   Tobacco Use  Smoking Status Former Smoker  . Packs/day: 0.30  . Years: 2.00  . Pack years: 0.60  . Types: Cigarettes  . Last attempt to quit: 07/11/1962  . Years since quitting: 55.3  Smokeless Tobacco Never Used     Counseling given: Not Answered   Past Medical History:  Diagnosis Date  . Abnormal liver function tests   . Allergy   . Anxiety   . Arthritis    knees, lower back   . Asthma   . Cataract    left eye growing cataract   . Diabetes mellitus    had gastric bypass DM resolved-   . Diverticulosis   . GERD (gastroesophageal reflux disease)   . Heart murmur   . History of colon polyps    adenomatous  . Hyperlipidemia    past hx- resloved after gastric bypass   . Hypertension   . Hypothyroidism   . IBS (irritable bowel syndrome)   . Iron deficiency anemia   . Osteoporosis    Past Surgical History:  Procedure Laterality Date  . ABDOMINAL HYSTERECTOMY    . ABDOMINAL HYSTERECTOMY    . APPENDECTOMY    . BLADDER REPAIR    . CATARACT EXTRACTION Left   . CHOLECYSTECTOMY    . COLONOSCOPY    . CORONARY ANGIOPLASTY    . GASTRIC BYPASS    . POLYPECTOMY    . TONSILLECTOMY AND ADENOIDECTOMY     Family History  Problem Relation Age of Onset  . Diabetes Sister   . Heart disease Maternal Grandmother   . Angina Maternal Grandmother   . Stroke Mother   . Suicidality Maternal  Grandfather   . Cancer Paternal Grandmother   . Obesity Father   . Colon cancer Neg Hx   . Colon polyps Neg Hx   . Esophageal cancer Neg Hx   . Rectal cancer Neg Hx   . Stomach cancer Neg Hx    Social History   Substance and Sexual Activity  Sexual Activity Not Currently    Outpatient Encounter Medications as of 10/25/2017  Medication Sig  . acetaminophen (TYLENOL) 650 MG CR tablet Take 650 mg by mouth every 8 (eight) hours as needed for pain.  Marland Kitchen alendronate (FOSAMAX) 70 MG tablet Take 1 tablet (70 mg total) by mouth once a week. Take with a full glass of water on an empty stomach.  . B-COMPLEX-C PO Take 1 tablet by mouth daily.  . cholecalciferol (VITAMIN D) 1000 UNITS tablet Take 1,000 Units by mouth daily.    . cyanocobalamin (,VITAMIN B-12,) 1000 MCG/ML injection INJECT 1ML INTO MUSCLE EVERY 30 DAYS  . fluticasone (FLONASE) 50 MCG/ACT nasal spray Place 2 sprays into both nostrils daily.  . Inulin (FIBER CHOICE FRUITY BITES PO)  Take 2 tablets by mouth daily.  Marland Kitchen levothyroxine (SYNTHROID) 125 MCG tablet TAKE 1 TABLET EACH DAY.  Marland Kitchen loperamide (IMODIUM) 2 MG capsule Take 2 mg by mouth as needed. 2 in the morning and 2 in the afternoon  . loratadine (CLARITIN) 10 MG tablet TAKE 1 TABLET ONCE DAILY.  Marland Kitchen losartan (COZAAR) 50 MG tablet Take 1 tablet (50 mg total) by mouth daily.  . metoprolol tartrate (LOPRESSOR) 25 MG tablet Take 1-2 tablets by mouth as needed for palpitations.  Marland Kitchen olopatadine (PATANOL) 0.1 % ophthalmic solution Place 1 drop into both eyes 2 (two) times daily.  . pantoprazole (PROTONIX) 40 MG tablet Take 1 tablet (40 mg total) by mouth daily.  Marland Kitchen PROAIR HFA 108 (90 Base) MCG/ACT inhaler USE 2 PUFFS EVERY 6 HOURS AS NEEDED.  Marland Kitchen Probiotic Product (ACIDOPHILUS PROBIOTIC BLEND PO) Take by mouth.    . Simethicone (GAS-X EXTRA STRENGTH) 62.5 MG STRP Take by mouth. As needed   . SYMBICORT 160-4.5 MCG/ACT inhaler USE 2 PUFFS TWICE A DAY.  Marland Kitchen venlafaxine XR (EFFEXOR-XR) 75 MG 24 hr  capsule Take 1 capsule (75 mg total) by mouth daily.   No facility-administered encounter medications on file as of 10/25/2017.     Activities of Daily Living No flowsheet data found.  Patient Care Team: Carollee Herter, Alferd Apa, DO as PCP - Gaston Islam, MD as Consulting Physician (Orthopedic Surgery) Marica Otter, [place] as Consulting Physician (Optometry) Sharyn Lull Mottinger as Consulting Physician (Dentistry) Marica Otter, OD (Optometry)    Assessment:    Physical assessment deferred to PCP.  Exercise Activities and Dietary recommendations   Diet (meal preparation, eat out, water intake, caffeinated beverages, dairy products, fruits and vegetables): {Desc; diets:16563} Breakfast: Lunch:  Dinner:      Goals    . Increase physical activity    . Lose 20 lbs by next year.  (pt-stated)      Fall Risk Fall Risk  08/06/2016 08/08/2015 12/27/2014 12/19/2013 04/18/2013  Falls in the past year? Yes No Yes No Yes  Number falls in past yr: 1 - 2 or more - 2 or more  Comment - - Tripped on a step - -  Injury with Fall? No - - - -  Risk for fall due to : Other (Comment) - - - Impaired balance/gait  Follow up Falls prevention discussed - - - -   Depression Screen PHQ 2/9 Scores 08/06/2016 08/08/2015 12/27/2014 12/19/2013  PHQ - 2 Score 0 0 0 0     Cognitive Function MMSE - Mini Mental State Exam 08/06/2016 08/08/2015  Orientation to time 5 5  Orientation to Place 5 5  Registration 3 3  Attention/ Calculation 5 5  Recall 3 3  Language- name 2 objects 2 2  Language- repeat 1 1  Language- follow 3 step command 3 3  Language- read & follow direction 1 1  Write a sentence 1 1  Copy design 1 1  Total score 30 30        Immunization History  Administered Date(s) Administered  . Influenza Split 09/08/2011, 08/25/2012  . Influenza Whole 08/26/2010  . Influenza, High Dose Seasonal PF 08/06/2016, 08/05/2017  . Influenza,inj,Quad PF,6+ Mos 08/09/2014, 08/08/2015  . Pneumococcal  Conjugate-13 12/27/2014  . Pneumococcal Polysaccharide-23 08/05/2010, 08/05/2017  . Tdap 07/12/2011  . Zoster 07/21/2011   Screening Tests Health Maintenance  Topic Date Due  . HEMOGLOBIN A1C  02/02/2018  . OPHTHALMOLOGY EXAM  07/21/2018  . FOOT EXAM  08/05/2018  .  MAMMOGRAM  12/31/2018  . COLONOSCOPY  07/11/2020  . TETANUS/TDAP  07/11/2021  . INFLUENZA VACCINE  Completed  . DEXA SCAN  Completed  . PNA vac Low Risk Adult  Completed      Plan:   ***   I have personally reviewed and noted the following in the patient's chart:   . Medical and social history . Use of alcohol, tobacco or illicit drugs  . Current medications and supplements . Functional ability and status . Nutritional status . Physical activity . Advanced directives . List of other physicians . Hospitalizations, surgeries, and ER visits in previous 12 months . Vitals . Screenings to include cognitive, depression, and falls . Referrals and appointments  In addition, I have reviewed and discussed with patient certain preventive protocols, quality metrics, and best practice recommendations. A written personalized care plan for preventive services as well as general preventive health recommendations were provided to patient.     Shela Nevin, [place]  10/17/2017

## 2017-10-25 ENCOUNTER — Ambulatory Visit: Payer: Medicare Other | Admitting: *Deleted

## 2017-10-25 ENCOUNTER — Ambulatory Visit (INDEPENDENT_AMBULATORY_CARE_PROVIDER_SITE_OTHER): Payer: Medicare Other | Admitting: *Deleted

## 2017-10-25 ENCOUNTER — Encounter: Payer: Self-pay | Admitting: *Deleted

## 2017-10-25 VITALS — BP 118/58 | HR 93 | Ht 67.0 in | Wt 184.0 lb

## 2017-10-25 DIAGNOSIS — Z0000 Encounter for general adult medical examination without abnormal findings: Secondary | ICD-10-CM

## 2017-10-25 DIAGNOSIS — Z1211 Encounter for screening for malignant neoplasm of colon: Secondary | ICD-10-CM

## 2017-10-25 NOTE — Patient Instructions (Signed)
Please schedule appointment to follow up with Dr.Lowne as directed.  Continue to eat heart healthy diet (full of fruits, vegetables, whole grains, lean protein, water--limit salt, fat, and sugar intake) and increase physical activity as tolerated.  Continue doing brain stimulating activities (puzzles, reading, adult coloring books, staying active) to keep memory sharp.   Bring a copy of your living will and/or healthcare power of attorney to your next office visit.  I have ordered your colonoscopy. They will call you to schedule.   Stacy Ayers , Thank you for taking time to come for your Medicare Wellness Visit. I appreciate your ongoing commitment to your health goals. Please review the following plan we discussed and let me know if I can assist you in the future.   These are the goals we discussed: Goals    . Increase physical activity    . Lose 20 lbs by next year.  (pt-stated)       This is a list of the screening recommended for you and due dates:  Health Maintenance  Topic Date Due  . Hemoglobin A1C  02/02/2018  . Eye exam for diabetics  07/21/2018  . Complete foot exam   08/05/2018  . Mammogram  12/31/2018  . Colon Cancer Screening  07/11/2020  . Tetanus Vaccine  07/11/2021  . Flu Shot  Completed  . DEXA scan (bone density measurement)  Completed  . Pneumonia vaccines  Completed      Health Maintenance for Postmenopausal Women Menopause is a normal process in which your reproductive ability comes to an end. This process happens gradually over a span of months to years, usually between the ages of 20 and 79. Menopause is complete when you have missed 12 consecutive menstrual periods. It is important to talk with your health care provider about some of the most common conditions that affect postmenopausal women, such as heart disease, cancer, and bone loss (osteoporosis). Adopting a healthy lifestyle and getting preventive care can help to promote your health and wellness.  Those actions can also lower your chances of developing some of these common conditions. What should I know about menopause? During menopause, you may experience a number of symptoms, such as:  Moderate-to-severe hot flashes.  Night sweats.  Decrease in sex drive.  Mood swings.  Headaches.  Tiredness.  Irritability.  Memory problems.  Insomnia.  Choosing to treat or not to treat menopausal changes is an individual decision that you make with your health care provider. What should I know about hormone replacement therapy and supplements? Hormone therapy products are effective for treating symptoms that are associated with menopause, such as hot flashes and night sweats. Hormone replacement carries certain risks, especially as you become older. If you are thinking about using estrogen or estrogen with progestin treatments, discuss the benefits and risks with your health care provider. What should I know about heart disease and stroke? Heart disease, heart attack, and stroke become more likely as you age. This may be due, in part, to the hormonal changes that your body experiences during menopause. These can affect how your body processes dietary fats, triglycerides, and cholesterol. Heart attack and stroke are both medical emergencies. There are many things that you can do to help prevent heart disease and stroke:  Have your blood pressure checked at least every 1-2 years. High blood pressure causes heart disease and increases the risk of stroke.  If you are 76-87 years old, ask your health care provider if you should take aspirin to  prevent a heart attack or a stroke.  Do not use any tobacco products, including cigarettes, chewing tobacco, or electronic cigarettes. If you need help quitting, ask your health care provider.  It is important to eat a healthy diet and maintain a healthy weight. ? Be sure to include plenty of vegetables, fruits, low-fat dairy products, and lean  protein. ? Avoid eating foods that are high in solid fats, added sugars, or salt (sodium).  Get regular exercise. This is one of the most important things that you can do for your health. ? Try to exercise for at least 150 minutes each week. The type of exercise that you do should increase your heart rate and make you sweat. This is known as moderate-intensity exercise. ? Try to do strengthening exercises at least twice each week. Do these in addition to the moderate-intensity exercise.  Know your numbers.Ask your health care provider to check your cholesterol and your blood glucose. Continue to have your blood tested as directed by your health care provider.  What should I know about cancer screening? There are several types of cancer. Take the following steps to reduce your risk and to catch any cancer development as early as possible. Breast Cancer  Practice breast self-awareness. ? This means understanding how your breasts normally appear and feel. ? It also means doing regular breast self-exams. Let your health care provider know about any changes, no matter how small.  If you are 46 or older, have a clinician do a breast exam (clinical breast exam or CBE) every year. Depending on your age, family history, and medical history, it may be recommended that you also have a yearly breast X-ray (mammogram).  If you have a family history of breast cancer, talk with your health care provider about genetic screening.  If you are at high risk for breast cancer, talk with your health care provider about having an MRI and a mammogram every year.  Breast cancer (BRCA) gene test is recommended for women who have family members with BRCA-related cancers. Results of the assessment will determine the need for genetic counseling and BRCA1 and for BRCA2 testing. BRCA-related cancers include these types: ? Breast. This occurs in males or females. ? Ovarian. ? Tubal. This may also be called fallopian tube  cancer. ? Cancer of the abdominal or pelvic lining (peritoneal cancer). ? Prostate. ? Pancreatic.  Cervical, Uterine, and Ovarian Cancer Your health care provider may recommend that you be screened regularly for cancer of the pelvic organs. These include your ovaries, uterus, and vagina. This screening involves a pelvic exam, which includes checking for microscopic changes to the surface of your cervix (Pap test).  For women ages 21-65, health care providers may recommend a pelvic exam and a Pap test every three years. For women ages 52-65, they may recommend the Pap test and pelvic exam, combined with testing for human papilloma virus (HPV), every five years. Some types of HPV increase your risk of cervical cancer. Testing for HPV may also be done on women of any age who have unclear Pap test results.  Other health care providers may not recommend any screening for nonpregnant women who are considered low risk for pelvic cancer and have no symptoms. Ask your health care provider if a screening pelvic exam is right for you.  If you have had past treatment for cervical cancer or a condition that could lead to cancer, you need Pap tests and screening for cancer for at least 20 years after  your treatment. If Pap tests have been discontinued for you, your risk factors (such as having a new sexual partner) need to be reassessed to determine if you should start having screenings again. Some women have medical problems that increase the chance of getting cervical cancer. In these cases, your health care provider may recommend that you have screening and Pap tests more often.  If you have a family history of uterine cancer or ovarian cancer, talk with your health care provider about genetic screening.  If you have vaginal bleeding after reaching menopause, tell your health care provider.  There are currently no reliable tests available to screen for ovarian cancer.  Lung Cancer Lung cancer screening is  recommended for adults 58-74 years old who are at high risk for lung cancer because of a history of smoking. A yearly low-dose CT scan of the lungs is recommended if you:  Currently smoke.  Have a history of at least 30 pack-years of smoking and you currently smoke or have quit within the past 15 years. A pack-year is smoking an average of one pack of cigarettes per day for one year.  Yearly screening should:  Continue until it has been 15 years since you quit.  Stop if you develop a health problem that would prevent you from having lung cancer treatment.  Colorectal Cancer  This type of cancer can be detected and can often be prevented.  Routine colorectal cancer screening usually begins at age 34 and continues through age 55.  If you have risk factors for colon cancer, your health care provider may recommend that you be screened at an earlier age.  If you have a family history of colorectal cancer, talk with your health care provider about genetic screening.  Your health care provider may also recommend using home test kits to check for hidden blood in your stool.  A small camera at the end of a tube can be used to examine your colon directly (sigmoidoscopy or colonoscopy). This is done to check for the earliest forms of colorectal cancer.  Direct examination of the colon should be repeated every 5-10 years until age 43. However, if early forms of precancerous polyps or small growths are found or if you have a family history or genetic risk for colorectal cancer, you may need to be screened more often.  Skin Cancer  Check your skin from head to toe regularly.  Monitor any moles. Be sure to tell your health care provider: ? About any new moles or changes in moles, especially if there is a change in a mole's shape or color. ? If you have a mole that is larger than the size of a pencil eraser.  If any of your family members has a history of skin cancer, especially at a young age,  talk with your health care provider about genetic screening.  Always use sunscreen. Apply sunscreen liberally and repeatedly throughout the day.  Whenever you are outside, protect yourself by wearing long sleeves, pants, a wide-brimmed hat, and sunglasses.  What should I know about osteoporosis? Osteoporosis is a condition in which bone destruction happens more quickly than new bone creation. After menopause, you may be at an increased risk for osteoporosis. To help prevent osteoporosis or the bone fractures that can happen because of osteoporosis, the following is recommended:  If you are 36-71 years old, get at least 1,000 mg of calcium and at least 600 mg of vitamin D per day.  If you are older than  age 36 but younger than age 68, get at least 1,200 mg of calcium and at least 600 mg of vitamin D per day.  If you are older than age 83, get at least 1,200 mg of calcium and at least 800 mg of vitamin D per day.  Smoking and excessive alcohol intake increase the risk of osteoporosis. Eat foods that are rich in calcium and vitamin D, and do weight-bearing exercises several times each week as directed by your health care provider. What should I know about how menopause affects my mental health? Depression may occur at any age, but it is more common as you become older. Common symptoms of depression include:  Low or sad mood.  Changes in sleep patterns.  Changes in appetite or eating patterns.  Feeling an overall lack of motivation or enjoyment of activities that you previously enjoyed.  Frequent crying spells.  Talk with your health care provider if you think that you are experiencing depression. What should I know about immunizations? It is important that you get and maintain your immunizations. These include:  Tetanus, diphtheria, and pertussis (Tdap) booster vaccine.  Influenza every year before the flu season begins.  Pneumonia vaccine.  Shingles vaccine.  Your health care  provider may also recommend other immunizations. This information is not intended to replace advice given to you by your health care provider. Make sure you discuss any questions you have with your health care provider. Document Released: 01/07/2006 Document Revised: 06/04/2016 Document Reviewed: 08/19/2015 Elsevier Interactive Patient Education  2018 [employer].

## 2017-10-25 NOTE — Progress Notes (Addendum)
Subjective:   Stacy Ayers is a 76 y.o. female who presents for Medicare Annual (Subsequent) preventive examination.  Review of Systems: No ROS.  Medicare Wellness Visit. Additional risk factors are reflected in the social history.  Cardiac Risk Factors include: advanced age (>54men, >81 women);dyslipidemia;hypertension Sleep patterns:  Takes tylenol pm occasionally. Sometimes has difficulty staying asleep. Home Safety/Smoke Alarms: Feels safe in home. Smoke alarms in place. Lives at [facility].     Female:   Mammo- last 01/26/17: BI-RADS CATEGORY  1: Negative.       Dexa scan-  Last 12/31/16: osteoporosis.      CCS- Last 01/21/11: Recall 5 yrs. ORDERED TODAY    Objective:     Vitals: BP (!) 118/58 (BP Location: Left Arm, Patient Position: Sitting)   Pulse 93   Ht 5\' 7"  (1.702 m)   Wt 184 lb (83.5 kg)   SpO2 98%   BMI 28.82 kg/m   Body mass index is 28.82 kg/m.   Tobacco Social History   Tobacco Use  Smoking Status Former Smoker  . Packs/day: 0.30  . Years: 2.00  . Pack years: 0.60  . Types: Cigarettes  . Last attempt to quit: 07/11/1962  . Years since quitting: 55.3  Smokeless Tobacco Never Used     Counseling given: Not Answered   Past Medical History:  Diagnosis Date  . Abnormal liver function tests   . Allergy   . Anxiety   . Arthritis    knees, lower back   . Asthma   . Cataract    left eye growing cataract   . Diabetes mellitus    had gastric bypass DM resolved-   . Diverticulosis   . GERD (gastroesophageal reflux disease)   . Heart murmur   . History of colon polyps    adenomatous  . Hyperlipidemia    past hx- resloved after gastric bypass   . Hypertension   . Hypothyroidism   . IBS (irritable bowel syndrome)   . Iron deficiency anemia   . Osteoporosis    Past Surgical History:  Procedure Laterality Date  . ABDOMINAL HYSTERECTOMY    . ABDOMINAL HYSTERECTOMY    . APPENDECTOMY    . BLADDER REPAIR    . CATARACT EXTRACTION Left   .  CHOLECYSTECTOMY    . COLONOSCOPY    . CORONARY ANGIOPLASTY    . GASTRIC BYPASS    . POLYPECTOMY    . TONSILLECTOMY AND ADENOIDECTOMY     Family History  Problem Relation Age of Onset  . Diabetes Sister   . Heart disease Maternal Grandmother   . Angina Maternal Grandmother   . Stroke Mother   . Suicidality Maternal Grandfather   . Cancer Paternal Grandmother   . Obesity Father   . Colon cancer Neg Hx   . Colon polyps Neg Hx   . Esophageal cancer Neg Hx   . Rectal cancer Neg Hx   . Stomach cancer Neg Hx    Social History   Substance and Sexual Activity  Sexual Activity Yes    Outpatient Encounter Medications as of 10/25/2017  Medication Sig  . acetaminophen (TYLENOL) 650 MG CR tablet Take 650 mg by mouth every 8 (eight) hours as needed for pain.  . cholecalciferol (VITAMIN D) 1000 UNITS tablet Take 1,000 Units by mouth daily.    . cyanocobalamin (,VITAMIN B-12,) 1000 MCG/ML injection INJECT 1ML INTO MUSCLE EVERY 30 DAYS  . fluticasone (FLONASE) 50 MCG/ACT nasal spray Place 2 sprays into  both nostrils daily.  Marland Kitchen levothyroxine (SYNTHROID) 125 MCG tablet TAKE 1 TABLET EACH DAY.  Marland Kitchen loperamide (IMODIUM) 2 MG capsule Take 2 mg by mouth as needed. 2 in the morning and 2 in the afternoon  . loratadine (CLARITIN) 10 MG tablet TAKE 1 TABLET ONCE DAILY.  Marland Kitchen losartan (COZAAR) 50 MG tablet Take 1 tablet (50 mg total) by mouth daily.  . pantoprazole (PROTONIX) 40 MG tablet Take 1 tablet (40 mg total) by mouth daily.  Marland Kitchen PROAIR HFA 108 (90 Base) MCG/ACT inhaler USE 2 PUFFS EVERY 6 HOURS AS NEEDED.  Marland Kitchen Probiotic Product (ACIDOPHILUS PROBIOTIC BLEND PO) Take by mouth.    . Simethicone (GAS-X EXTRA STRENGTH) 62.5 MG STRP Take by mouth. As needed   . SYMBICORT 160-4.5 MCG/ACT inhaler USE 2 PUFFS TWICE A DAY.  Marland Kitchen venlafaxine XR (EFFEXOR-XR) 75 MG 24 hr capsule Take 1 capsule (75 mg total) by mouth daily.  . B-COMPLEX-C PO Take 1 tablet by mouth daily.  . Inulin (FIBER CHOICE FRUITY BITES PO) Take 2  tablets by mouth daily.  . metoprolol tartrate (LOPRESSOR) 25 MG tablet Take 1-2 tablets by mouth as needed for palpitations. (Patient not taking: Reported on 10/25/2017)  . olopatadine (PATANOL) 0.1 % ophthalmic solution Place 1 drop into both eyes 2 (two) times daily. (Patient not taking: Reported on 10/25/2017)  . [DISCONTINUED] alendronate (FOSAMAX) 70 MG tablet Take 1 tablet (70 mg total) by mouth once a week. Take with a full glass of water on an empty stomach. (Patient not taking: Reported on 10/25/2017)   No facility-administered encounter medications on file as of 10/25/2017.     Activities of Daily Living In your present state of health, do you have any difficulty performing the following activities: 10/25/2017  Hearing? N  Vision? N  Comment hx cataract sx.   Difficulty concentrating or making decisions? N  Walking or climbing stairs? Y  Comment "funny left knee"  Dressing or bathing? N  Doing errands, shopping? N  Preparing Food and eating ? N  Using the Toilet? N  In the past six months, have you accidently leaked urine? Y  Comment wears panty liners  Do you have problems with loss of bowel control? Y  Comment diarrhea frequently  Managing your Medications? N  Managing your Finances? N  Housekeeping or managing your Housekeeping? N  Some recent data might be hidden    Patient Care Team: Carollee Herter, Alferd Apa, DO as PCP - General Gaynelle Arabian, MD as Consulting Physician (Orthopedic Surgery) Marica Otter, [place] as Consulting Physician (Optometry) Nyra Capes as Consulting Physician (Dentistry) Marica Otter, OD (Optometry)    Assessment:    Physical assessment deferred to PCP.  Exercise Activities and Dietary recommendations Current Exercise Habits: Home exercise routine, Time (Minutes): 30, Intensity: Mild Diet (meal preparation, eat out, water intake, caffeinated beverages, dairy products, fruits and vegetables): in general, an "unhealthy" diet      Goals    . Increase physical activity    . Lose 20 lbs by next year.  (pt-stated)      Fall Risk Fall Risk  10/25/2017 08/06/2016 08/08/2015 12/27/2014 12/19/2013  Falls in the past year? Yes Yes No Yes No  Number falls in past yr: 2 or more 1 - 2 or more -  Comment - - - Tripped on a step -  Injury with Fall? No No - - -  Risk for fall due to : - Other (Comment) - - -  Follow up Education provided;Falls  prevention discussed Falls prevention discussed - - -   Depression Screen PHQ 2/9 Scores 10/25/2017 08/06/2016 08/08/2015 12/27/2014  PHQ - 2 Score 0 0 0 0     Cognitive Function MMSE - Mini Mental State Exam 10/25/2017 08/06/2016 08/08/2015  Orientation to time 5 5 5   Orientation to Place 5 5 5   Registration 3 3 3   Attention/ Calculation 5 5 5   Recall 3 3 3   Language- name 2 objects 2 2 2   Language- repeat 1 1 1   Language- follow 3 step command 3 3 3   Language- read & follow direction 1 1 1   Write a sentence 1 1 1   Copy design 1 1 1   Total score 30 30 30         Immunization History  Administered Date(s) Administered  . Influenza Split 09/08/2011, 08/25/2012  . Influenza Whole 08/26/2010  . Influenza, High Dose Seasonal PF 08/06/2016, 08/05/2017  . Influenza,inj,Quad PF,6+ Mos 08/09/2014, 08/08/2015  . Pneumococcal Conjugate-13 12/27/2014  . Pneumococcal Polysaccharide-23 08/05/2010, 08/05/2017  . Tdap 07/12/2011  . Zoster 07/21/2011   Screening Tests Health Maintenance  Topic Date Due  . HEMOGLOBIN A1C  02/02/2018  . OPHTHALMOLOGY EXAM  07/21/2018  . FOOT EXAM  08/05/2018  . MAMMOGRAM  12/31/2018  . COLONOSCOPY  07/11/2020  . TETANUS/TDAP  07/11/2021  . INFLUENZA VACCINE  Completed  . DEXA SCAN  Completed  . PNA vac Low Risk Adult  Completed      Plan:   Please schedule appointment to follow up with Dr.Lowne as directed.  Continue to eat heart healthy diet (full of fruits, vegetables, whole grains, lean protein, water--limit salt, fat, and sugar intake) and  increase physical activity as tolerated.  Continue doing brain stimulating activities (puzzles, reading, adult coloring books, staying active) to keep memory sharp.   Bring a copy of your living will and/or healthcare power of attorney to your next office visit.  I have ordered your colonoscopy. They will call you to schedule.   I have personally reviewed and noted the following in the patient's chart:   . Medical and social history . Use of alcohol, tobacco or illicit drugs  . Current medications and supplements . Functional ability and status . Nutritional status . Physical activity . Advanced directives . List of other physicians . Hospitalizations, surgeries, and ER visits in previous 12 months . Vitals . Screenings to include cognitive, depression, and falls . Referrals and appointments  In addition, I have reviewed and discussed with patient certain preventive protocols, quality metrics, and best practice recommendations. A written personalized care plan for preventive services as well as general preventive health recommendations were provided to patient.     Shela Nevin, RN  10/25/2017   Reviewed [name], DO

## 2017-10-27 ENCOUNTER — Encounter: Payer: Self-pay | Admitting: Internal Medicine

## 2017-11-14 ENCOUNTER — Encounter: Payer: Self-pay | Admitting: Medical

## 2017-11-14 ENCOUNTER — Ambulatory Visit: Payer: Medicare Other | Admitting: Medical

## 2017-11-14 VITALS — BP 125/53 | HR 97 | Temp 98.3°F | Resp 16 | Ht 67.0 in | Wt 184.4 lb

## 2017-11-14 DIAGNOSIS — R300 Dysuria: Secondary | ICD-10-CM | POA: Diagnosis not present

## 2017-11-14 LAB — POC URINALSYSI DIPSTICK (AUTOMATED)
BILIRUBIN UA: NEGATIVE
Glucose, UA: NEGATIVE
KETONES UA: NEGATIVE
NITRITE UA: NEGATIVE
Protein, UA: NEGATIVE
Spec Grav, UA: 1.025 (ref 1.010–1.025)
Urobilinogen, UA: NEGATIVE E.U./dL — AB
pH, UA: 6 (ref 5.0–8.0)

## 2017-11-14 MED ORDER — CIPROFLOXACIN HCL 500 MG PO TABS: 500.0000 mg | ORAL_TABLET | Freq: Two times a day (BID) | ORAL | 0 refills | Status: DC

## 2017-11-14 MED ORDER — PHENAZOPYRIDINE HCL 200 MG PO TABS: 200.0000 mg | ORAL_TABLET | Freq: Three times a day (TID) | ORAL | 0 refills | Status: DC | PRN

## 2017-11-14 NOTE — Patient Instructions (Signed)
Your appear to have a urinary tract infection. I am prescribing ciprofloxin  antibiotic for the probable infection. Hydrate well. I am sending out a urine culture. During the interim if your signs and symptoms worsen rather than improving please notify us. We will notify your when the culture results are back.  Can use pyridium for urinary pain.  Follow up in 7 days or as needed.

## 2017-11-14 NOTE — Progress Notes (Signed)
Subjective:    Patient ID: Stacy Ayers, female    DOB: Apr 27, 1941, 76 y.o.   MRN: [id number]  HPI  Pt in with some urinary symptoms for 3 weeks. Gradually getting worse. Pt tried hydrating and cranberry juice but not improving. She has cloudy urine with bad smell. Dysuria at end of stream. Low grade fever 2 weeks ago. No chills or sweats. Mild faint cva area tenderness. Hx of very rare uti.    Review of Systems  Constitutional: Negative for chills and fatigue.  Respiratory: Negative for cough, chest tightness, shortness of breath and wheezing.   Cardiovascular: Negative for chest pain and palpitations.  Genitourinary: Positive for dysuria and flank pain. Negative for frequency, urgency, vaginal bleeding, vaginal discharge and vaginal pain.       Mild cva are tenderness at times.  Musculoskeletal: Negative for gait problem.       See urinary complaints.  Skin: Negative for rash.  Psychiatric/Behavioral: Negative for behavioral problems and suicidal ideas. The patient is not nervous/anxious.      Past Medical History:  Diagnosis Date  . Abnormal liver function tests   . Allergy   . Anxiety   . Arthritis    knees, lower back   . Asthma   . Cataract    left eye growing cataract   . Diabetes mellitus    had gastric bypass DM resolved-   . Diverticulosis   . GERD (gastroesophageal reflux disease)   . Heart murmur   . History of colon polyps    adenomatous  . Hyperlipidemia    past hx- resloved after gastric bypass   . Hypertension   . Hypothyroidism   . IBS (irritable bowel syndrome)   . Iron deficiency anemia   . Osteoporosis      Social History   Socioeconomic History  . Marital status: Single    Spouse name: Not on file  . Number of children: 1  . Years of education: Not on file  . Highest education level: Not on file  Social Needs  . Financial resource strain: Not on file  . Food insecurity - worry: Not on file  . Food insecurity - inability: Not on file    . Transportation needs - medical: Not on file  . Transportation needs - non-medical: Not on file  Occupational History  . Occupation: retired Product manager: RETIRED  Tobacco Use  . Smoking status: Former Smoker    Packs/day: 0.30    Years: 2.00    Pack years: 0.60    Types: Cigarettes    Last attempt to quit: 07/11/1962    Years since quitting: 55.3  . Smokeless tobacco: Never Used  Substance and Sexual Activity  . Alcohol use: Yes    Alcohol/week: 8.4 oz    Types: 14 Glasses of wine per week    Comment: 3-4 glasses per day  . Drug use: No  . Sexual activity: Yes  Other Topics Concern  . Not on file  Social History Narrative   Daily caffeine    Past Surgical History:  Procedure Laterality Date  . ABDOMINAL HYSTERECTOMY    . ABDOMINAL HYSTERECTOMY    . APPENDECTOMY    . BLADDER REPAIR    . CATARACT EXTRACTION Left   . CHOLECYSTECTOMY    . COLONOSCOPY    . CORONARY ANGIOPLASTY    . GASTRIC BYPASS    . POLYPECTOMY    . TONSILLECTOMY AND ADENOIDECTOMY  Family History  Problem Relation Age of Onset  . Diabetes Sister   . Heart disease Maternal Grandmother   . Angina Maternal Grandmother   . Stroke Mother   . Suicidality Maternal Grandfather   . Cancer Paternal Grandmother   . Obesity Father   . Colon cancer Neg Hx   . Colon polyps Neg Hx   . Esophageal cancer Neg Hx   . Rectal cancer Neg Hx   . Stomach cancer Neg Hx     Allergies  Allergen Reactions  . Sulfonamide Derivatives Diarrhea  . Cocaine     Other reaction(s): Hallucination Sinus irrigation  . Codeine   . Montelukast Sodium   . Penicillins   . Pioglitazone   . Rosiglitazone Maleate   . Secobarbital Sodium   . Doxycycline Nausea Only    REACTION: GI UPSET    Current Outpatient Medications on File Prior to Visit  Medication Sig Dispense Refill  . acetaminophen (TYLENOL) 650 MG CR tablet Take 650 mg by mouth every 8 (eight) hours as needed for pain.    . B-COMPLEX-C PO Take 1  tablet by mouth daily.    . cholecalciferol (VITAMIN D) 1000 UNITS tablet Take 1,000 Units by mouth daily.      . cyanocobalamin (,VITAMIN B-12,) 1000 MCG/ML injection INJECT 1ML INTO MUSCLE EVERY 30 DAYS 3 mL 0  . fluticasone (FLONASE) 50 MCG/ACT nasal spray Place 2 sprays into both nostrils daily. 16 g 1  . levothyroxine (SYNTHROID) 125 MCG tablet TAKE 1 TABLET EACH DAY. 90 tablet 3  . loperamide (IMODIUM) 2 MG capsule Take 2 mg by mouth as needed. 2 in the morning and 2 in the afternoon    . loratadine (CLARITIN) 10 MG tablet TAKE 1 TABLET ONCE DAILY. 30 tablet 0  . losartan (COZAAR) 50 MG tablet Take 1 tablet (50 mg total) by mouth daily. 90 tablet 1  . metoprolol tartrate (LOPRESSOR) 25 MG tablet Take 1-2 tablets by mouth as needed for palpitations. 30 tablet 3  . olopatadine (PATANOL) 0.1 % ophthalmic solution Place 1 drop into both eyes 2 (two) times daily. 5 mL 3  . pantoprazole (PROTONIX) 40 MG tablet Take 1 tablet (40 mg total) by mouth daily. 90 tablet 3  . PROAIR HFA 108 (90 Base) MCG/ACT inhaler USE 2 PUFFS EVERY 6 HOURS AS NEEDED. 8.5 g 0  . Probiotic Product (ACIDOPHILUS PROBIOTIC BLEND PO) Take by mouth.      . Simethicone (GAS-X EXTRA STRENGTH) 62.5 MG STRP Take by mouth. As needed     . SYMBICORT 160-4.5 MCG/ACT inhaler USE 2 PUFFS TWICE A DAY. 10.2 g 0  . venlafaxine XR (EFFEXOR-XR) 75 MG 24 hr capsule Take 1 capsule (75 mg total) by mouth daily. 90 capsule 3   No current facility-administered medications on file prior to visit.     There were no vitals taken for this visit.      Objective:   Physical Exam  General Appearance- Not in acute distress.  HEENT Eyes- Scleraeral/Conjuntiva-bilat- Not Yellow. Mouth & Throat- Normal.  Chest and Lung Exam Auscultation: Breath sounds:-Normal. Adventitious sounds:- No Adventitious sounds.  Cardiovascular Auscultation:Rythm - Regular. Heart Sounds -Normal heart sounds.  Abdomen Inspection:-Inspection Normal.    Palpation/Perucssion: Palpation and Percussion of the abdomen reveal- Non Tender, No Rebound tenderness, No rigidity(Guarding) and No Palpable abdominal masses.  Liver:-Normal.  Spleen:- Normal.   Back-no CVA tenderness presently.      Assessment & Plan:  Your appear to have a urinary  tract infection. I am prescribing ciprofloxin  antibiotic for the probable infection. Hydrate well. I am sending out a urine culture. During the interim if your signs and symptoms worsen rather than improving please notify us. We will notify your when the culture results are back.  Can use pyridium for urinary pain.  Follow up in 7 days or as needed.   Sharilyn Geisinger, Percell Miller, PA-C

## 2017-11-15 LAB — URINE CULTURE
MICRO NUMBER: 81415032
SPECIMEN QUALITY: ADEQUATE

## 2017-11-16 ENCOUNTER — Telehealth: Payer: Self-pay | Admitting: Medical

## 2017-11-16 MED ORDER — FOSFOMYCIN TROMETHAMINE 3 G PO PACK: 3.0000 g | PACK | Freq: Once | ORAL | 0 refills | Status: AC

## 2017-11-16 NOTE — Telephone Encounter (Signed)
Copied from [place] [id number]. Topic: Quick Communication - See Telephone Encounter >> Nov 16, 2017  2:14 PM Oneta Rack wrote: CRM for notification. See Telephone encounter for:   11/16/17.  Relation to HM:CNOB Call back number:[phone]   Reason for call:   Patient last seen by Percell Miller 11/14/17 as per lab notes   Notes recorded by Mackie Pai, PA-C on 11/16/2017 at 8:49 AM EST How are you feeling after starting Cipro? Your urine culture did come back positive but it grew out Streptococcus agalactiae. This is not the typical bacteria you find in the urine/UTI though it can occur. Cipro typically not the best antibiotic for this type of bacteria. With your allergy histories they are some limitations. If you are still symptomatic I could prescribe an antibiotic called fosfomycin. It is not on your allergy list. Let me know how you are feeling. Hope you are doing well.  *patient requesting fosfomycin please send to   [place], [place] [phone] (Phone) [phone] (Fax)

## 2017-11-16 NOTE — Telephone Encounter (Signed)
Pt is requesting fosfomycin.

## 2017-11-16 NOTE — Telephone Encounter (Signed)
Prescription of fosfomycin sent to patient's pharmacy.  Advised to stop Cipro.

## 2017-12-13 ENCOUNTER — Ambulatory Visit (AMBULATORY_SURGERY_CENTER): Payer: Self-pay

## 2017-12-13 ENCOUNTER — Telehealth: Payer: Self-pay

## 2017-12-13 ENCOUNTER — Other Ambulatory Visit: Payer: Self-pay

## 2017-12-13 VITALS — Ht 67.5 in | Wt 185.2 lb

## 2017-12-13 DIAGNOSIS — Z86010 Personal history of colonic polyps: Secondary | ICD-10-CM

## 2017-12-13 MED ORDER — NA SULFATE-K SULFATE-MG SULF 17.5-3.13-1.6 GM/177ML PO SOLN: 1.0000 | Freq: Once | ORAL | 0 refills | Status: AC

## 2017-12-13 NOTE — Telephone Encounter (Signed)
Thanks, I will let the patient know.   Riki Sheer, LPN

## 2017-12-13 NOTE — Progress Notes (Signed)
Denies allergies to eggs or soy products. Denies complication of anesthesia or sedation. Denies use of weight loss medication. Denies use of O2.   Emmi instructions declined.  

## 2017-12-13 NOTE — Telephone Encounter (Signed)
Keep plans for colonoscopy based on available information

## 2017-12-13 NOTE — Telephone Encounter (Signed)
Dr. Henrene Pastor,    I saw Stacy Ayers in [facility] today. She was scheduled for a colonoscopy back in May 2018. The procedure was cancelled because she was having palpitations. The patient saw Dr. Claiborne Billings back in June 2018. Dr. Claiborne Billings ordered an echo, but the patient never had it done. I asked the patient today why she did not follow through with the echo and she said that she has not had any further problem with palpitations and the way Dr. Claiborne Billings worded her options she did not think that it was an absolute necessity that she have it done. She understood that if she had any problems she could go forward with the testing. Stacy Ayers said that money was tight and since she did not continue to have any problems she would pass on the echo.   Stacy Ayers is scheduled for a colonoscopy on 1/29 and she has already purchased her prep from the last scheduled colonoscopy. Please advise if the patient can keep her scheduled colonoscopy or does she have to have an echo prior to procedure. I told the patient that I would call her after I heard back from you.  Thanks.   Riki Sheer, LPN ( [facility])

## 2017-12-13 NOTE — Telephone Encounter (Signed)
Ok to proceed with scheduled colonoscopy per Dr. Henrene Pastor. Patient was informed.   Riki Sheer, LPN ( [facility] )

## 2017-12-14 ENCOUNTER — Encounter: Payer: Self-pay | Admitting: Internal Medicine

## 2017-12-26 ENCOUNTER — Ambulatory Visit: Payer: Medicare Other | Admitting: Family Medicine

## 2017-12-26 ENCOUNTER — Encounter: Payer: Self-pay | Admitting: Family Medicine

## 2017-12-26 VITALS — BP 136/70 | HR 81 | Temp 98.3°F | Resp 16 | Ht 68.0 in | Wt 182.6 lb

## 2017-12-26 DIAGNOSIS — R300 Dysuria: Secondary | ICD-10-CM

## 2017-12-26 LAB — POC URINALSYSI DIPSTICK (AUTOMATED)
BILIRUBIN UA: NEGATIVE
Glucose, UA: NEGATIVE
KETONES UA: NEGATIVE
LEUKOCYTES UA: NEGATIVE
NITRITE UA: NEGATIVE
PH UA: 6 (ref 5.0–8.0)
PROTEIN UA: NEGATIVE
RBC UA: NEGATIVE
Spec Grav, UA: 1.015 (ref 1.010–1.025)
Urobilinogen, UA: 0.2 E.U./dL

## 2017-12-26 NOTE — Patient Instructions (Signed)

## 2017-12-26 NOTE — Progress Notes (Signed)
Patient ID: Stacy Ayers, female   DOB: June 07, 1941, 77 y.o.   MRN: [id number]    Subjective:  I acted as a Education administrator for Dr. Carollee Herter.  Stacy Ayers, Stacy Ayers   Patient ID: Stacy Ayers, female    DOB: May 02, 1941, 77 y.o.   MRN: [id number]  Chief Complaint  Patient presents with  . Dysuria    HPI  Patient is in today for dysuria.  She had a steroid injection in her knee week before last.  No vag d/c or itching.    Patient Care Team: Carollee Herter, Alferd Apa, DO as PCP - Gaston Islam, MD as Consulting Physician (Orthopedic Surgery) Marica Otter, [place] as Consulting Physician (Optometry) Sharyn Lull Mottinger as Consulting Physician (Dentistry) Marica Otter, OD (Optometry)   Past Medical History:  Diagnosis Date  . Abnormal liver function tests   . Allergy   . Anxiety   . Arthritis    knees, lower back   . Asthma   . Cataract    left eye growing cataract   . Depression   . Diabetes mellitus    had gastric bypass DM resolved-   . Diverticulosis   . GERD (gastroesophageal reflux disease)   . Heart murmur   . History of colon polyps    adenomatous  . Hyperlipidemia    past hx- resloved after gastric bypass   . Hypertension   . Hypothyroidism   . IBS (irritable bowel syndrome)   . Iron deficiency anemia   . Osteoporosis     Past Surgical History:  Procedure Laterality Date  . ABDOMINAL HYSTERECTOMY    . ABDOMINAL HYSTERECTOMY    . APPENDECTOMY    . BLADDER REPAIR    . CATARACT EXTRACTION Left   . CHOLECYSTECTOMY    . COLONOSCOPY    . CORONARY ANGIOPLASTY    . GASTRIC BYPASS    . POLYPECTOMY    . TONSILLECTOMY AND ADENOIDECTOMY      Family History  Problem Relation Age of Onset  . Diabetes Sister   . Heart disease Maternal Grandmother   . Angina Maternal Grandmother   . Stroke Mother   . Suicidality Maternal Grandfather   . Cancer Paternal Grandmother   . Obesity Father   . Colon cancer Neg Hx   . Colon polyps Neg Hx   . Esophageal cancer Neg Hx   .  Rectal cancer Neg Hx   . Stomach cancer Neg Hx   . Pancreatic cancer Neg Hx   . Liver disease Neg Hx     Social History   Socioeconomic History  . Marital status: Single    Spouse name: Not on file  . Number of children: 1  . Years of education: Not on file  . Highest education level: Not on file  Social Needs  . Financial resource strain: Not on file  . Food insecurity - worry: Not on file  . Food insecurity - inability: Not on file  . Transportation needs - medical: Not on file  . Transportation needs - non-medical: Not on file  Occupational History  . Occupation: retired Product manager: RETIRED  Tobacco Use  . Smoking status: Former Smoker    Packs/day: 0.30    Years: 2.00    Pack years: 0.60    Types: Cigarettes    Last attempt to quit: 07/11/1962    Years since quitting: 55.4  . Smokeless tobacco: Never Used  Substance and Sexual Activity  . Alcohol use: Yes  Alcohol/week: 8.4 oz    Types: 14 Glasses of wine per week    Comment: 3-4 glasses per day  . Drug use: No  . Sexual activity: Yes  Other Topics Concern  . Not on file  Social History Narrative   Daily caffeine    Outpatient Medications Prior to Visit  Medication Sig Dispense Refill  . acetaminophen (TYLENOL) 650 MG CR tablet Take 650 mg by mouth every 8 (eight) hours as needed for pain.    . B-COMPLEX-C PO Take 1 tablet by mouth daily.    . cholecalciferol (VITAMIN D) 1000 UNITS tablet Take 1,000 Units by mouth daily.      . cyanocobalamin (,VITAMIN B-12,) 1000 MCG/ML injection INJECT 1ML INTO MUSCLE EVERY 30 DAYS 3 mL 0  . fluticasone (FLONASE) 50 MCG/ACT nasal spray Place 2 sprays into both nostrils daily. 16 g 1  . levothyroxine (SYNTHROID) 125 MCG tablet TAKE 1 TABLET EACH DAY. 90 tablet 3  . loperamide (IMODIUM) 2 MG capsule Take 2 mg by mouth as needed. 2 in the morning and 2 in the afternoon    . loratadine (CLARITIN) 10 MG tablet TAKE 1 TABLET ONCE DAILY. 30 tablet 0  . losartan (COZAAR)  50 MG tablet Take 1 tablet (50 mg total) by mouth daily. 90 tablet 1  . metoprolol tartrate (LOPRESSOR) 25 MG tablet Take 1-2 tablets by mouth as needed for palpitations. 30 tablet 3  . pantoprazole (PROTONIX) 40 MG tablet Take 1 tablet (40 mg total) by mouth daily. 90 tablet 3  . PROAIR HFA 108 (90 Base) MCG/ACT inhaler USE 2 PUFFS EVERY 6 HOURS AS NEEDED. 8.5 g 0  . Probiotic Product (ACIDOPHILUS PROBIOTIC BLEND PO) Take by mouth.      . Simethicone (GAS-X EXTRA STRENGTH) 62.5 MG STRP Take by mouth. As needed     . SYMBICORT 160-4.5 MCG/ACT inhaler USE 2 PUFFS TWICE A DAY. 10.2 g 0  . venlafaxine XR (EFFEXOR-XR) 75 MG 24 hr capsule Take 1 capsule (75 mg total) by mouth daily. 90 capsule 3   No facility-administered medications prior to visit.     Allergies  Allergen Reactions  . Sulfonamide Derivatives Diarrhea  . Cocaine     Other reaction(s): Hallucination Sinus irrigation  . Codeine   . Montelukast Sodium   . Penicillins   . Pioglitazone   . Rosiglitazone Maleate   . Secobarbital Sodium   . Doxycycline Nausea Only    REACTION: GI UPSET    Review of Systems  Constitutional: Negative for fever and malaise/fatigue.  HENT: Negative for congestion.   Eyes: Negative for blurred vision.  Respiratory: Negative for cough and shortness of breath.   Cardiovascular: Negative for chest pain, palpitations and leg swelling.  Gastrointestinal: Negative for vomiting.  Genitourinary: Positive for dysuria.  Musculoskeletal: Negative for back pain.  Skin: Negative for rash.  Neurological: Negative for loss of consciousness and headaches.       Objective:    Physical Exam  Constitutional: She is oriented to person, place, and time. She appears well-developed and well-nourished.  HENT:  Head: Normocephalic and atraumatic.  Eyes: Conjunctivae and EOM are normal.  Neck: Normal range of motion. Neck supple. No JVD present. Carotid bruit is not present. No thyromegaly present.    Cardiovascular: Normal rate, regular rhythm and normal heart sounds.  No murmur heard. Pulmonary/Chest: Effort normal and breath sounds normal. No respiratory distress. She has no wheezes. She has no rales. She exhibits no tenderness.  Musculoskeletal: She  exhibits no edema.  Neurological: She is alert and oriented to person, place, and time.  Psychiatric: She has a normal mood and affect.  Nursing note and vitals reviewed.  poc glucose-- 94  BP 136/70 (BP Location: Right Arm, Cuff Size: Large)   Pulse 81   Temp 98.3 F (36.8 C) (Oral)   Resp 16   Ht 5\' 8"  (1.727 m)   Wt 182 lb 9.6 oz (82.8 kg)   SpO2 98%   BMI 27.76 kg/m  Wt Readings from Last 3 Encounters:  12/26/17 182 lb 9.6 oz (82.8 kg)  12/13/17 185 lb 3.2 oz (84 kg)  11/14/17 184 lb 6.4 oz (83.6 kg)   BP Readings from Last 3 Encounters:  12/26/17 136/70  11/14/17 (!) 125/53  10/25/17 (!) 118/58     Immunization History  Administered Date(s) Administered  . Influenza Split 09/08/2011, 08/25/2012  . Influenza Whole 08/26/2010  . Influenza, High Dose Seasonal PF 08/06/2016, 08/05/2017  . Influenza,inj,Quad PF,6+ Mos 08/09/2014, 08/08/2015  . Pneumococcal Conjugate-13 12/27/2014  . Pneumococcal Polysaccharide-23 08/05/2010, 08/05/2017  . Tdap 07/12/2011  . Zoster 07/21/2011    Health Maintenance  Topic Date Due  . HEMOGLOBIN A1C  02/02/2018  . OPHTHALMOLOGY EXAM  07/21/2018  . FOOT EXAM  08/05/2018  . MAMMOGRAM  12/31/2018  . COLONOSCOPY  07/11/2020  . TETANUS/TDAP  07/11/2021  . INFLUENZA VACCINE  Completed  . DEXA SCAN  Completed  . PNA vac Low Risk Adult  Completed    Lab Results  Component Value Date   WBC 4.9 08/05/2017   HGB 12.3 08/05/2017   HCT 36.6 08/05/2017   PLT 190.0 08/05/2017   GLUCOSE 117 (H) 08/05/2017   CHOL 199 08/05/2017   TRIG 281.0 (H) 08/05/2017   HDL 50.80 08/05/2017   LDLDIRECT 116.0 08/05/2017   LDLCALC 91 08/06/2016   ALT 38 (H) 08/05/2017   AST 41 (H) 08/05/2017    NA 139 08/05/2017   K 4.0 08/05/2017   CL 103 08/05/2017   CREATININE 0.70 08/05/2017   BUN 14 08/05/2017   CO2 29 08/05/2017   TSH 3.16 08/05/2017   INR Positive 04/01/2017   HGBA1C 5.5 08/05/2017    Lab Results  Component Value Date   TSH 3.16 08/05/2017   Lab Results  Component Value Date   WBC 4.9 08/05/2017   HGB 12.3 08/05/2017   HCT 36.6 08/05/2017   MCV 99.4 08/05/2017   PLT 190.0 08/05/2017   Lab Results  Component Value Date   NA 139 08/05/2017   K 4.0 08/05/2017   CO2 29 08/05/2017   GLUCOSE 117 (H) 08/05/2017   BUN 14 08/05/2017   CREATININE 0.70 08/05/2017   BILITOT 0.4 08/05/2017   ALKPHOS 60 08/05/2017   AST 41 (H) 08/05/2017   ALT 38 (H) 08/05/2017   PROT 6.7 08/05/2017   ALBUMIN 4.2 08/05/2017   CALCIUM 9.7 08/05/2017   GFR 86.89 08/05/2017   Lab Results  Component Value Date   CHOL 199 08/05/2017   Lab Results  Component Value Date   HDL 50.80 08/05/2017   Lab Results  Component Value Date   LDLCALC 91 08/06/2016   Lab Results  Component Value Date   TRIG 281.0 (H) 08/05/2017   Lab Results  Component Value Date   CHOLHDL 4 08/05/2017   Lab Results  Component Value Date   HGBA1C 5.5 08/05/2017         Assessment & Plan:   Problem List Items Addressed This Visit  None    Visit Diagnoses    Dysuria    -  Primary   Relevant Orders   POCT Urinalysis Dipstick (Automated) (Completed)   Urine Culture           Check urine culture         If neg and symptoms persist--- refer to urology         Glucose normal  I am having Caiden A. Offer maintain her Simethicone, loperamide, cholecalciferol, Probiotic Product (ACIDOPHILUS PROBIOTIC BLEND PO), acetaminophen, B-COMPLEX-C PO, SYMBICORT, PROAIR HFA, fluticasone, metoprolol tartrate, venlafaxine XR, levothyroxine, pantoprazole, losartan, cyanocobalamin, and loratadine.  No orders of the defined types were placed in this encounter.   CMA served as Education administrator during this visit.  History, Physical and Plan performed by medical provider. Documentation and orders reviewed and attested to.  [name], DO

## 2017-12-27 ENCOUNTER — Other Ambulatory Visit: Payer: Self-pay

## 2017-12-27 ENCOUNTER — Encounter: Payer: Self-pay | Admitting: Internal Medicine

## 2017-12-27 ENCOUNTER — Ambulatory Visit (AMBULATORY_SURGERY_CENTER): Payer: Medicare Other | Admitting: Internal Medicine

## 2017-12-27 VITALS — BP 145/78 | HR 63 | Temp 97.7°F | Resp 14 | Ht 67.0 in | Wt 184.0 lb

## 2017-12-27 DIAGNOSIS — D122 Benign neoplasm of ascending colon: Secondary | ICD-10-CM

## 2017-12-27 DIAGNOSIS — D126 Benign neoplasm of colon, unspecified: Secondary | ICD-10-CM | POA: Diagnosis not present

## 2017-12-27 DIAGNOSIS — K635 Polyp of colon: Secondary | ICD-10-CM

## 2017-12-27 DIAGNOSIS — D125 Benign neoplasm of sigmoid colon: Secondary | ICD-10-CM | POA: Diagnosis not present

## 2017-12-27 DIAGNOSIS — Z86010 Personal history of colonic polyps: Secondary | ICD-10-CM | POA: Diagnosis not present

## 2017-12-27 LAB — URINE CULTURE
MICRO NUMBER: 90115743
SPECIMEN QUALITY: ADEQUATE

## 2017-12-27 MED ORDER — SODIUM CHLORIDE 0.9 % IV SOLN: 500.0000 mL | Freq: Once | INTRAVENOUS | Status: DC

## 2017-12-27 NOTE — Op Note (Signed)
[place] Patient Name: Stacy Ayers Procedure Date: 12/27/2017 1:54 PM MRN: [id number] Endoscopist: Docia Chuck. Henrene Pastor , MD Age: 77 Referring MD:  Date of Birth: [birth year] Gender: Female Account #: [id number] Procedure:                Colonoscopy, with cold snare polypectomy x 2 Indications:              High risk colon cancer surveillance: Personal                            history of non-advanced adenoma, High risk colon                            cancer surveillance: Personal history of sessile                            serrated colon polyp (less than 10 mm in size) with                            no dysplasia. Previous examinations 2006 and 2012 Medicines:                Monitored Anesthesia Care Procedure:                Pre-Anesthesia Assessment:                           - Prior to the procedure, a History and Physical                            was performed, and patient medications and                            allergies were reviewed. The patient's tolerance of                            previous anesthesia was also reviewed. The risks                            and benefits of the procedure and the sedation                            options and risks were discussed with the patient.                            All questions were answered, and informed consent                            was obtained. Prior Anticoagulants: The patient has                            taken no previous anticoagulant or antiplatelet                            agents. ASA Grade Assessment: II - A patient with  mild systemic disease. After reviewing the risks                            and benefits, the patient was deemed in                            satisfactory condition to undergo the procedure.                           After obtaining informed consent, the colonoscope                            was passed under direct vision. Throughout the              procedure, the patient's blood pressure, pulse, and                            oxygen saturations were monitored continuously. The                            Colonoscope was introduced through the anus and                            advanced to the the cecum, identified by                            appendiceal orifice and ileocecal valve. The                            ileocecal valve, appendiceal orifice, and rectum                            were photographed. The quality of the bowel                            preparation was excellent. The colonoscopy was                            performed without difficulty. The patient tolerated                            the procedure well. The bowel preparation used was                            SUPREP. Scope In: 2:22:10 PM Scope Out: 2:38:06 PM Scope Withdrawal Time: 0 hours 12 minutes 24 seconds  Total Procedure Duration: 0 hours 15 minutes 56 seconds  Findings:                 Two polyps were found in the sigmoid colon and                            ascending colon. The polyps were 1 to 4 mm in size.  These polyps were removed with a cold snare.                            Resection and retrieval were complete.                           The exam was otherwise without abnormality on                            direct and retroflexion views. Complications:            No immediate complications. Estimated blood loss:                            None. Estimated Blood Loss:     Estimated blood loss: none. Impression:               - Two 1 and 4 mm polyps in the sigmoid colon and in                            the ascending colon, removed with a cold snare.                            Resected and retrieved.                           - The examination was otherwise normal on direct                            and retroflexion views. Recommendation:           - Repeat colonoscopy is not recommended for                             surveillance, given current age and findings on                            current exam.                           - Patient has a contact number available for                            emergencies. The signs and symptoms of potential                            delayed complications were discussed with the                            patient. Return to normal activities tomorrow.                            Written discharge instructions were provided to the                            patient.                           -  Resume previous diet.                           - Continue present medications. Docia Chuck. Henrene Pastor, MD 12/27/2017 2:44:43 PM This report has been signed electronically.

## 2017-12-27 NOTE — Patient Instructions (Signed)
YOU HAD AN ENDOSCOPIC PROCEDURE TODAY AT THE [facility]:   Refer to the procedure report that was given to you for any specific questions about what was found during the examination.  If the procedure report does not answer your questions, please call your gastroenterologist to clarify.  If you requested that your care partner not be given the details of your procedure findings, then the procedure report has been included in a sealed envelope for you to review at your convenience later.  YOU SHOULD EXPECT: Some feelings of bloating in the abdomen. Passage of more gas than usual.  Walking can help get rid of the air that was put into your GI tract during the procedure and reduce the bloating. If you had a lower endoscopy (such as a colonoscopy or flexible sigmoidoscopy) you may notice spotting of blood in your stool or on the toilet paper. If you underwent a bowel prep for your procedure, you may not have a normal bowel movement for a few days.  Please Note:  You might notice some irritation and congestion in your nose or some drainage.  This is from the oxygen used during your procedure.  There is no need for concern and it should clear up in a day or so.  SYMPTOMS TO REPORT IMMEDIATELY:   Following lower endoscopy (colonoscopy or flexible sigmoidoscopy):  Excessive amounts of blood in the stool  Significant tenderness or worsening of abdominal pains  Swelling of the abdomen that is new, acute  Fever of 100F or higher  For urgent or emergent issues, a gastroenterologist can be reached at any hour by calling [phone].   DIET:  We do recommend a small meal at first, but then you may proceed to your regular diet.  Drink plenty of fluids but you should avoid alcoholic beverages for 24 hours.  MEDICATIONS: Continue present medications.  Please see handouts given to you by your recovery nurse.  ACTIVITY:  You should plan to take it easy for the rest of today and you should NOT  DRIVE or use heavy machinery until tomorrow (because of the sedation medicines used during the test).    FOLLOW UP: Our staff will call the number listed on your records the next business day following your procedure to check on you and address any questions or concerns that you may have regarding the information given to you following your procedure. If we do not reach you, we will leave a message.  However, if you are feeling well and you are not experiencing any problems, there is no need to return our call.  We will assume that you have returned to your regular daily activities without incident.  If any biopsies were taken you will be contacted by phone or by letter within the next 1-3 weeks.  Please call us at [phone] if you have not heard about the biopsies in 3 weeks.   Thank you for allowing us to provide for your healthcare needs today.   SIGNATURES/CONFIDENTIALITY: You and/or your care partner have signed paperwork which will be entered into your electronic medical record.  These signatures attest to the fact that that the information above on your After Visit Summary has been reviewed and is understood.  Full responsibility of the confidentiality of this discharge information lies with you and/or your care-partner. 

## 2017-12-27 NOTE — Progress Notes (Signed)
Called to room to assist during endoscopic procedure.  Patient ID and intended procedure confirmed with present staff. Received instructions for my participation in the procedure from the performing physician.  

## 2017-12-27 NOTE — Progress Notes (Signed)
A and O x3. Report to RN. Tolerated MAC anesthesia well.

## 2017-12-27 NOTE — Progress Notes (Signed)
Pt's states no medical or surgical changes since previsit or office visit. 

## 2017-12-28 ENCOUNTER — Telehealth: Payer: Self-pay | Admitting: *Deleted

## 2017-12-28 NOTE — Telephone Encounter (Signed)
  Follow up Call-  Call back number 12/27/2017  Post procedure Call Back phone  # [phone]  Permission to leave phone message Yes  Some recent data might be hidden     Patient questions:  Do you have a fever, pain , or abdominal swelling? No. Pain Score  0 *  Have you tolerated food without any problems? Yes.    Have you been able to return to your normal activities? Yes.    Do you have any questions about your discharge instructions: Diet   No. Medications  No. Follow up visit  No.  Do you have questions or concerns about your Care? No.  Actions: * If pain score is 4 or above: No action needed, pain <4.

## 2018-01-03 ENCOUNTER — Encounter: Payer: Self-pay | Admitting: Internal Medicine

## 2018-01-03 ENCOUNTER — Telehealth: Payer: Self-pay | Admitting: Family Medicine

## 2018-01-03 NOTE — Telephone Encounter (Signed)
Called in and was given urine results.  Contaminated--if still symptomatic-we can recheck--if negative will probably need urology referral.    She is not really having symptoms now so she wants to wait and see how she does.  I instructed her to call us back if her symptoms return.   She verbalized understanding and agreed to calling us back if symptoms return.  This information was routed to Dr. Nonda Lou nurse pool.

## 2018-02-19 ENCOUNTER — Other Ambulatory Visit: Payer: Self-pay | Admitting: Family Medicine

## 2018-02-19 DIAGNOSIS — I10 Essential (primary) hypertension: Secondary | ICD-10-CM

## 2018-03-06 ENCOUNTER — Telehealth: Payer: Self-pay | Admitting: Family Medicine

## 2018-03-06 DIAGNOSIS — E58 Dietary calcium deficiency: Secondary | ICD-10-CM

## 2018-03-06 NOTE — Telephone Encounter (Signed)
Prolia benefits received PA Not required $50 copay for prolia $20 admin fee   Patient may owe approximately $35 OOP  Patient due after 03/27/18  Letter mailed to inform patient of benefits and to schedule

## 2018-04-07 ENCOUNTER — Ambulatory Visit: Payer: Medicare Other | Admitting: Family Medicine

## 2018-04-07 NOTE — Telephone Encounter (Signed)
Author phoned pt. to f/u no show appointment today with Dr. Etter Sjogren. No message, so generic message left on VM to call back [phone].   Pt. Needs to schedule a lab appointment for CMP before she can receive her first prolia injection if she is still interested in doing so. Awaiting call back from pt.

## 2018-04-11 NOTE — Telephone Encounter (Signed)
Patient called back to return [name]'s call. I have scheduled patient for CMP on Friday.

## 2018-04-11 NOTE — Addendum Note (Signed)
Addended by: Wynonia Musty A on: 04/11/2018 09:20 AM   Modules accepted: Orders

## 2018-04-14 ENCOUNTER — Other Ambulatory Visit (INDEPENDENT_AMBULATORY_CARE_PROVIDER_SITE_OTHER): Payer: Medicare Other

## 2018-04-14 DIAGNOSIS — E58 Dietary calcium deficiency: Secondary | ICD-10-CM | POA: Diagnosis not present

## 2018-04-14 LAB — COMPREHENSIVE METABOLIC PANEL
ALK PHOS: 49 U/L (ref 39–117)
ALT: 27 U/L (ref 0–35)
AST: 28 U/L (ref 0–37)
Albumin: 4 g/dL (ref 3.5–5.2)
BUN: 13 mg/dL (ref 6–23)
CO2: 32 meq/L (ref 19–32)
Calcium: 9.3 mg/dL (ref 8.4–10.5)
Chloride: 102 mEq/L (ref 96–112)
Creatinine, Ser: 0.72 mg/dL (ref 0.40–1.20)
GFR: 83.96 mL/min (ref >60.00)
GLUCOSE: 94 mg/dL (ref 70–99)
POTASSIUM: 4.4 meq/L (ref 3.5–5.1)
Sodium: 138 mEq/L (ref 135–145)
Total Bilirubin: 0.5 mg/dL (ref 0.2–1.2)
Total Protein: 6.5 g/dL (ref 6.0–8.3)

## 2018-04-18 ENCOUNTER — Other Ambulatory Visit: Payer: Self-pay | Admitting: Family Medicine

## 2018-04-18 DIAGNOSIS — E538 Deficiency of other specified B group vitamins: Secondary | ICD-10-CM

## 2018-04-20 DIAGNOSIS — Z9884 Bariatric surgery status: Secondary | ICD-10-CM | POA: Insufficient documentation

## 2018-05-01 ENCOUNTER — Telehealth: Payer: Self-pay | Admitting: Family Medicine

## 2018-05-01 NOTE — Telephone Encounter (Signed)
Copied from [place] [id number]. Topic: Quick Communication - See Telephone Encounter >> May 01, 2018  1:48 PM Cleaster Corin, NT wrote: CRM for notification. See Telephone encounter for: 05/01/18.  Pt. Calling to sched. Prolia injection

## 2018-05-02 ENCOUNTER — Ambulatory Visit: Payer: Medicare Other | Admitting: Internal Medicine

## 2018-05-02 ENCOUNTER — Encounter: Payer: Self-pay | Admitting: Family Medicine

## 2018-05-02 ENCOUNTER — Ambulatory Visit: Payer: Medicare Other | Admitting: Family Medicine

## 2018-05-02 VITALS — BP 127/60 | HR 88 | Temp 98.0°F | Resp 16 | Wt 186.6 lb

## 2018-05-02 DIAGNOSIS — R300 Dysuria: Secondary | ICD-10-CM | POA: Diagnosis not present

## 2018-05-02 DIAGNOSIS — M810 Age-related osteoporosis without current pathological fracture: Secondary | ICD-10-CM | POA: Diagnosis not present

## 2018-05-02 LAB — POC URINALSYSI DIPSTICK (AUTOMATED)
Bilirubin, UA: NEGATIVE
Blood, UA: NEGATIVE
GLUCOSE UA: NEGATIVE
Ketones, UA: NEGATIVE
LEUKOCYTES UA: NEGATIVE
Nitrite, UA: NEGATIVE
PROTEIN UA: NEGATIVE
Spec Grav, UA: 1.025 (ref 1.010–1.025)
UROBILINOGEN UA: 0.2 U/dL
pH, UA: 6 (ref 5.0–8.0)

## 2018-05-02 MED ORDER — PHENAZOPYRIDINE HCL 200 MG PO TABS: 200.0000 mg | ORAL_TABLET | Freq: Three times a day (TID) | ORAL | 0 refills | Status: DC | PRN

## 2018-05-02 MED ORDER — DENOSUMAB 60 MG/ML ~~LOC~~ SOSY
60.0000 mg | PREFILLED_SYRINGE | Freq: Once | SUBCUTANEOUS | Status: AC
  Administered 2018-05-02: 60 mg via SUBCUTANEOUS

## 2018-05-02 NOTE — Patient Instructions (Signed)
Interstitial Cystitis Interstitial cystitis is a condition that causes inflammation of the bladder. The bladder is a hollow organ in the lower part of your abdomen. It stores urine after the urine is made by your kidneys. With interstitial cystitis, you may have pain in the bladder area. You may also have a frequent and urgent need to urinate. The severity of interstitial cystitis can vary from person to person. You may have flare-ups of the condition, and then it may go away for a while. For many people who have this condition, it becomes a long-term problem. What are the causes? The cause of this condition is not known. What increases the risk? This condition is more likely to develop in women. What are the signs or symptoms? Symptoms of interstitial cystitis vary, and they can change over time. Symptoms may include:  Discomfort or pain in the bladder area. This can range from mild to severe. The pain may change in intensity as the bladder fills with urine or as it empties.  Pelvic pain.  An urgent need to urinate.  Frequent urination.  Pain during sexual intercourse.  Pinpoint bleeding on the bladder wall.  For women, the symptoms often get worse during menstruation. How is this diagnosed? This condition is diagnosed by evaluating your symptoms and ruling out other causes. A physical exam will be done. Various tests may be done to rule out other conditions. Common tests include:  Urine tests.  Cystoscopy. In this test, a tool that is like a very thin telescope is used to look into your bladder.  Biopsy. This involves taking a sample of tissue from the bladder wall to be examined under a microscope.  How is this treated? There is no cure for interstitial cystitis, but treatment methods are available to control your symptoms. Work closely with your health care provider to find the treatments that will be most effective for you. Treatment options may include:  Medicines to relieve  pain and to help reduce the number of times that you feel the need to urinate.  Bladder training. This involves learning ways to control when you urinate, such as: ? Urinating at scheduled times. ? Training yourself to delay urination. ? Doing exercises (Kegel exercises) to strengthen the muscles that control urine flow.  Lifestyle changes, such as changing your diet or taking steps to control stress.  Use of a device that provides electrical stimulation in order to reduce pain.  A procedure that stretches your bladder by filling it with air or fluid.  Surgery. This is rare. It is only done for extreme cases if other treatments do not help.  Follow these instructions at home:  Take medicines only as directed by your health care provider.  Use bladder training techniques as directed. ? Keep a bladder diary to find out which foods, liquids, or activities make your symptoms worse. ? Use your bladder diary to schedule bathroom trips. If you are away from home, plan to be near a bathroom at each of your scheduled times. ? Make sure you urinate just before you leave the house and just before you go to bed.  Do Kegel exercises as directed by your health care provider.  Do not drink alcohol.  Do not use any tobacco products, including cigarettes, chewing tobacco, or electronic cigarettes. If you need help quitting, ask your health care provider.  Make dietary changes as directed by your health care provider. You may need to avoid spicy foods and foods that contain a high amount   of potassium.  Limit your drinking of beverages that stimulate urination. These include soda, coffee, and tea.  Keep all follow-up visits as directed by your health care provider. This is important. Contact a health care provider if:  Your symptoms do not get better after treatment.  Your pain and discomfort are getting worse.  You have more frequent urges to urinate.  You have a fever. Get help right away  if:  You are not able to control your bladder at all. This information is not intended to replace advice given to you by your health care provider. Make sure you discuss any questions you have with your health care provider. Document Released: 07/16/2004 Document Revised: 04/22/2016 Document Reviewed: 07/23/2014 Elsevier Interactive Patient Education  2018 Elsevier Inc.  

## 2018-05-02 NOTE — Progress Notes (Signed)
Subjective:  I acted as a Education administrator for [employer]. Yancey Flemings, [place]   Patient ID: Stacy Ayers, female    DOB: 1941-09-21, 77 y.o.   MRN: [id number]  Chief Complaint  Patient presents with  . Urinary Tract Infection    HPI  Patient is in today for an urinary tract infection.  Pt c/o dysuria and frequency.  No other symptoms. Pt also would like her prolia injection. Patient Care Team: Carollee Herter, Alferd Apa, DO as PCP - Gaston Islam, MD as Consulting Physician (Orthopedic Surgery) Marica Otter, [place] as Consulting Physician (Optometry) Sharyn Lull Mottinger as Consulting Physician (Dentistry) Marica Otter, OD (Optometry)   Past Medical History:  Diagnosis Date  . Abnormal liver function tests   . Allergy   . Anxiety   . Arthritis    knees, lower back   . Asthma   . Cataract    left eye growing cataract   . Depression   . Diabetes mellitus    had gastric bypass DM resolved-   . Diverticulosis   . GERD (gastroesophageal reflux disease)   . Heart murmur   . History of colon polyps    adenomatous  . Hyperlipidemia    past hx- resloved after gastric bypass   . Hypertension   . Hypothyroidism   . IBS (irritable bowel syndrome)   . Iron deficiency anemia   . Osteoporosis     Past Surgical History:  Procedure Laterality Date  . ABDOMINAL HYSTERECTOMY    . ABDOMINAL HYSTERECTOMY    . APPENDECTOMY    . BLADDER REPAIR    . CATARACT EXTRACTION Left   . CHOLECYSTECTOMY    . COLONOSCOPY    . CORONARY ANGIOPLASTY    . GASTRIC BYPASS    . POLYPECTOMY    . TONSILLECTOMY AND ADENOIDECTOMY      Family History  Problem Relation Age of Onset  . Diabetes Sister   . Heart disease Maternal Grandmother   . Angina Maternal Grandmother   . Stroke Mother   . Suicidality Maternal Grandfather   . Cancer Paternal Grandmother   . Obesity Father   . Colon cancer Neg Hx   . Colon polyps Neg Hx   . Esophageal cancer Neg Hx   . Rectal cancer Neg Hx   . Stomach cancer Neg  Hx   . Pancreatic cancer Neg Hx   . Liver disease Neg Hx     Social History   Socioeconomic History  . Marital status: Single    Spouse name: Not on file  . Number of children: 1  . Years of education: Not on file  . Highest education level: Not on file  Occupational History  . Occupation: retired Product manager: RETIRED  Social Needs  . Financial resource strain: Not on file  . Food insecurity:    Worry: Not on file    Inability: Not on file  . Transportation needs:    Medical: Not on file    Non-medical: Not on file  Tobacco Use  . Smoking status: Former Smoker    Packs/day: 0.30    Years: 2.00    Pack years: 0.60    Types: Cigarettes    Last attempt to quit: 07/11/1962    Years since quitting: 55.8  . Smokeless tobacco: Never Used  Substance and Sexual Activity  . Alcohol use: Yes    Alcohol/week: 8.4 oz    Types: 14 Glasses of wine per week    Comment: 3-4  glasses per day  . Drug use: No  . Sexual activity: Yes  Lifestyle  . Physical activity:    Days per week: Not on file    Minutes per session: Not on file  . Stress: Not on file  Relationships  . Social connections:    Talks on phone: Not on file    Gets together: Not on file    Attends religious service: Not on file    Active member of club or organization: Not on file    Attends meetings of clubs or organizations: Not on file    Relationship status: Not on file  . Intimate partner violence:    Fear of current or ex partner: Not on file    Emotionally abused: Not on file    Physically abused: Not on file    Forced sexual activity: Not on file  Other Topics Concern  . Not on file  Social History Narrative   Daily caffeine    Outpatient Medications Prior to Visit  Medication Sig Dispense Refill  . acetaminophen (TYLENOL) 650 MG CR tablet Take 650 mg by mouth every 8 (eight) hours as needed for pain.    . B-COMPLEX-C PO Take 1 tablet by mouth daily.    . cholecalciferol (VITAMIN D) 1000  UNITS tablet Take 1,000 Units by mouth daily.      . cyanocobalamin (,VITAMIN B-12,) 1000 MCG/ML injection INJECT 1ML INTO MUSCLE EVERY 30 DAYS 3 mL 0  . fluticasone (FLONASE) 50 MCG/ACT nasal spray Place 2 sprays into both nostrils daily. 16 g 1  . levothyroxine (SYNTHROID) 125 MCG tablet TAKE 1 TABLET EACH DAY. 90 tablet 3  . loperamide (IMODIUM) 2 MG capsule Take 2 mg by mouth as needed. 2 in the morning and 2 in the afternoon    . loratadine (CLARITIN) 10 MG tablet TAKE 1 TABLET ONCE DAILY. 30 tablet 0  . losartan (COZAAR) 50 MG tablet TAKE 1 TABLET ONCE DAILY. 90 tablet 0  . metoprolol tartrate (LOPRESSOR) 25 MG tablet Take 1-2 tablets by mouth as needed for palpitations. 30 tablet 3  . pantoprazole (PROTONIX) 40 MG tablet Take 1 tablet (40 mg total) by mouth daily. 90 tablet 3  . PROAIR HFA 108 (90 Base) MCG/ACT inhaler USE 2 PUFFS EVERY 6 HOURS AS NEEDED. 8.5 g 0  . Probiotic Product (ACIDOPHILUS PROBIOTIC BLEND PO) Take by mouth.      . Simethicone (GAS-X EXTRA STRENGTH) 62.5 MG STRP Take by mouth. As needed     . SYMBICORT 160-4.5 MCG/ACT inhaler USE 2 PUFFS TWICE A DAY. 10.2 g 0  . venlafaxine XR (EFFEXOR-XR) 75 MG 24 hr capsule Take 1 capsule (75 mg total) by mouth daily. 90 capsule 3   Facility-Administered Medications Prior to Visit  Medication Dose Route Frequency Provider Last Rate Last Dose  . 0.9 %  sodium chloride infusion  500 mL Intravenous Once Irene Shipper, MD        Allergies  Allergen Reactions  . Sulfonamide Derivatives Diarrhea  . Cocaine     Other reaction(s): Hallucination Sinus irrigation  . Codeine   . Montelukast Sodium   . Penicillins   . Pioglitazone   . Rosiglitazone Maleate   . Secobarbital Sodium   . Doxycycline Nausea Only    REACTION: GI UPSET    Review of Systems  Constitutional: Negative for chills, fever and malaise/fatigue.  HENT: Negative for congestion and hearing loss.   Eyes: Negative for discharge.  Respiratory: Negative for  cough, sputum production and shortness of breath.   Cardiovascular: Negative for chest pain, palpitations and leg swelling.  Gastrointestinal: Negative for abdominal pain, blood in stool, constipation, diarrhea, heartburn, nausea and vomiting.  Genitourinary: Positive for dysuria and frequency. Negative for flank pain, hematuria and urgency.  Musculoskeletal: Negative for back pain, falls and myalgias.  Skin: Negative for rash.  Neurological: Negative for dizziness, sensory change, loss of consciousness, weakness and headaches.  Endo/Heme/Allergies: Negative for environmental allergies. Does not bruise/bleed easily.  Psychiatric/Behavioral: Negative for depression and suicidal ideas. The patient is not nervous/anxious and does not have insomnia.        Objective:    Physical Exam  Constitutional: She is oriented to person, place, and time. She appears well-developed and well-nourished.  HENT:  Head: Normocephalic and atraumatic.  Eyes: Conjunctivae and EOM are normal.  Neck: Normal range of motion. Neck supple. No JVD present. Carotid bruit is not present. No thyromegaly present.  Cardiovascular: Normal rate, regular rhythm and normal heart sounds.  No murmur heard. Pulmonary/Chest: Effort normal and breath sounds normal. No respiratory distress. She has no wheezes. She has no rales. She exhibits no tenderness.  Musculoskeletal: She exhibits no edema.  Neurological: She is alert and oriented to person, place, and time.  Psychiatric: She has a normal mood and affect.  Nursing note and vitals reviewed.   BP 127/60 (BP Location: Left Arm, Patient Position: Sitting, Cuff Size: Normal)   Pulse 88   Temp 98 F (36.7 C) (Oral)   Resp 16   Wt 186 lb 9.6 oz (84.6 kg)   SpO2 99%   BMI 29.23 kg/m  Wt Readings from Last 3 Encounters:  05/02/18 186 lb 9.6 oz (84.6 kg)  12/27/17 184 lb (83.5 kg)  12/26/17 182 lb 9.6 oz (82.8 kg)   BP Readings from Last 3 Encounters:  05/02/18 127/60    12/27/17 (!) 145/78  12/26/17 136/70     Immunization History  Administered Date(s) Administered  . Influenza Split 09/08/2011, 08/25/2012  . Influenza Whole 08/26/2010  . Influenza, High Dose Seasonal PF 08/06/2016, 08/05/2017  . Influenza,inj,Quad PF,6+ Mos 08/09/2014, 08/08/2015  . Pneumococcal Conjugate-13 12/27/2014  . Pneumococcal Polysaccharide-23 08/05/2010, 08/05/2017  . Tdap 07/12/2011  . Zoster 07/21/2011    Health Maintenance  Topic Date Due  . HEMOGLOBIN A1C  02/02/2018  . INFLUENZA VACCINE  06/29/2018  . OPHTHALMOLOGY EXAM  07/21/2018  . FOOT EXAM  08/05/2018  . MAMMOGRAM  12/31/2018  . TETANUS/TDAP  07/11/2021  . COLONOSCOPY  12/28/2027  . DEXA SCAN  Completed  . PNA vac Low Risk Adult  Completed    Lab Results  Component Value Date   WBC 4.9 08/05/2017   HGB 12.3 08/05/2017   HCT 36.6 08/05/2017   PLT 190.0 08/05/2017   GLUCOSE 94 04/14/2018   CHOL 199 08/05/2017   TRIG 281.0 (H) 08/05/2017   HDL 50.80 08/05/2017   LDLDIRECT 116.0 08/05/2017   LDLCALC 91 08/06/2016   ALT 27 04/14/2018   AST 28 04/14/2018   NA 138 04/14/2018   K 4.4 04/14/2018   CL 102 04/14/2018   CREATININE 0.72 04/14/2018   BUN 13 04/14/2018   CO2 32 04/14/2018   TSH 3.16 08/05/2017   INR Positive 04/01/2017   HGBA1C 5.5 08/05/2017    Lab Results  Component Value Date   TSH 3.16 08/05/2017   Lab Results  Component Value Date   WBC 4.9 08/05/2017   HGB 12.3 08/05/2017   HCT 36.6 08/05/2017  MCV 99.4 08/05/2017   PLT 190.0 08/05/2017   Lab Results  Component Value Date   NA 138 04/14/2018   K 4.4 04/14/2018   CO2 32 04/14/2018   GLUCOSE 94 04/14/2018   BUN 13 04/14/2018   CREATININE 0.72 04/14/2018   BILITOT 0.5 04/14/2018   ALKPHOS 49 04/14/2018   AST 28 04/14/2018   ALT 27 04/14/2018   PROT 6.5 04/14/2018   ALBUMIN 4.0 04/14/2018   CALCIUM 9.3 04/14/2018   GFR 83.96 04/14/2018   Lab Results  Component Value Date   CHOL 199 08/05/2017   Lab  Results  Component Value Date   HDL 50.80 08/05/2017   Lab Results  Component Value Date   LDLCALC 91 08/06/2016   Lab Results  Component Value Date   TRIG 281.0 (H) 08/05/2017   Lab Results  Component Value Date   CHOLHDL 4 08/05/2017   Lab Results  Component Value Date   HGBA1C 5.5 08/05/2017         Assessment & Plan:   Problem List Items Addressed This Visit      Unprioritized   Dysuria - Primary    Suspect IC Refer to urology [facility] for 2 days  Culture pending  Avoid etoh, caffeine-- see HO on IC       Relevant Medications   phenazopyridine (PYRIDIUM) 200 MG tablet   Other Relevant Orders   Ambulatory referral to Urology   POCT Urinalysis Dipstick (Automated) (Completed)   Urine Culture    Other Visit Diagnoses    Osteoporosis, unspecified osteoporosis type, unspecified pathological fracture presence       Relevant Medications   denosumab (PROLIA) injection 60 mg (Completed)      I am having Luberta A. Kiernan start on phenazopyridine. I am also having her maintain her Simethicone, loperamide, cholecalciferol, Probiotic Product (ACIDOPHILUS PROBIOTIC BLEND PO), acetaminophen, B-COMPLEX-C PO, SYMBICORT, PROAIR HFA, fluticasone, metoprolol tartrate, venlafaxine XR, levothyroxine, pantoprazole, loratadine, losartan, and cyanocobalamin. We administered denosumab. We will continue to administer sodium chloride.  Meds ordered this encounter  Medications  . phenazopyridine (PYRIDIUM) 200 MG tablet    Sig: Take 1 tablet (200 mg total) by mouth 3 (three) times daily as needed for pain.    Dispense:  6 tablet    Refill:  0  . denosumab (PROLIA) injection 60 mg    CMA served as scribe during this visit. History, Physical and Plan performed by medical provider. Documentation and orders reviewed and attested to.  [name], DO

## 2018-05-02 NOTE — Telephone Encounter (Signed)
Prolia injection given to patient at office visit.

## 2018-05-03 DIAGNOSIS — R300 Dysuria: Secondary | ICD-10-CM | POA: Insufficient documentation

## 2018-05-03 NOTE — Assessment & Plan Note (Addendum)
Suspect IC Refer to urology [facility] for 2 days  Culture pending  Avoid etoh, caffeine-- see HO on IC

## 2018-05-04 LAB — URINE CULTURE
MICRO NUMBER:: 90676279
SPECIMEN QUALITY:: ADEQUATE

## 2018-05-12 DIAGNOSIS — M179 Osteoarthritis of knee, unspecified: Secondary | ICD-10-CM | POA: Insufficient documentation

## 2018-05-12 DIAGNOSIS — M1712 Unilateral primary osteoarthritis, left knee: Secondary | ICD-10-CM | POA: Insufficient documentation

## 2018-06-05 ENCOUNTER — Ambulatory Visit: Payer: Medicare Other | Admitting: Family

## 2018-06-05 ENCOUNTER — Other Ambulatory Visit: Payer: Self-pay | Admitting: Family Medicine

## 2018-06-05 ENCOUNTER — Encounter: Payer: Self-pay | Admitting: Family

## 2018-06-05 VITALS — BP 152/69 | HR 78 | Temp 99.4°F | Resp 18 | Ht 68.0 in | Wt 185.4 lb

## 2018-06-05 DIAGNOSIS — J069 Acute upper respiratory infection, unspecified: Secondary | ICD-10-CM

## 2018-06-05 DIAGNOSIS — J029 Acute pharyngitis, unspecified: Secondary | ICD-10-CM | POA: Diagnosis not present

## 2018-06-05 DIAGNOSIS — I10 Essential (primary) hypertension: Secondary | ICD-10-CM

## 2018-06-05 LAB — POCT RAPID STREP A (OFFICE): RAPID STREP A SCREEN: NEGATIVE

## 2018-06-05 NOTE — Progress Notes (Signed)
Subjective:    Patient ID: Stacy Ayers, female    DOB: 10-01-1941, 77 y.o.   MRN: [id number]  HPI  Patient is a 77 yr old female who presents today with chief complaint of cough. Reports symptoms started 3 days ago with tickle, had associated sore throat, then voice hoarseness. Now has developed a cough. Reports some right sided ear discomfort. Reports subjective temperature.  Reports that she has used an antihistamine, otc cough medicine. Some improvement with these measures.     Review of Systems See HPI  Past Medical History:  Diagnosis Date  . Abnormal liver function tests   . Allergy   . Anxiety   . Arthritis    knees, lower back   . Asthma   . Cataract    left eye growing cataract   . Depression   . Diabetes mellitus    had gastric bypass DM resolved-   . Diverticulosis   . GERD (gastroesophageal reflux disease)   . Heart murmur   . History of colon polyps    adenomatous  . Hyperlipidemia    past hx- resloved after gastric bypass   . Hypertension   . Hypothyroidism   . IBS (irritable bowel syndrome)   . Iron deficiency anemia   . Osteoporosis      Social History   Socioeconomic History  . Marital status: Single    Spouse name: Not on file  . Number of children: 1  . Years of education: Not on file  . Highest education level: Not on file  Occupational History  . Occupation: retired Product manager: RETIRED  Social Needs  . Financial resource strain: Not on file  . Food insecurity:    Worry: Not on file    Inability: Not on file  . Transportation needs:    Medical: Not on file    Non-medical: Not on file  Tobacco Use  . Smoking status: Former Smoker    Packs/day: 0.30    Years: 2.00    Pack years: 0.60    Types: Cigarettes    Last attempt to quit: 07/11/1962    Years since quitting: 55.9  . Smokeless tobacco: Never Used  Substance and Sexual Activity  . Alcohol use: Yes    Alcohol/week: 8.4 oz    Types: 14 Glasses of wine per week   Comment: 3-4 glasses per day  . Drug use: No  . Sexual activity: Yes  Lifestyle  . Physical activity:    Days per week: Not on file    Minutes per session: Not on file  . Stress: Not on file  Relationships  . Social connections:    Talks on phone: Not on file    Gets together: Not on file    Attends religious service: Not on file    Active member of club or organization: Not on file    Attends meetings of clubs or organizations: Not on file    Relationship status: Not on file  . Intimate partner violence:    Fear of current or ex partner: Not on file    Emotionally abused: Not on file    Physically abused: Not on file    Forced sexual activity: Not on file  Other Topics Concern  . Not on file  Social History Narrative   Daily caffeine    Past Surgical History:  Procedure Laterality Date  . ABDOMINAL HYSTERECTOMY    . ABDOMINAL HYSTERECTOMY    . APPENDECTOMY    .  BLADDER REPAIR    . CATARACT EXTRACTION Left   . CHOLECYSTECTOMY    . COLONOSCOPY    . CORONARY ANGIOPLASTY    . GASTRIC BYPASS    . POLYPECTOMY    . TONSILLECTOMY AND ADENOIDECTOMY      Family History  Problem Relation Age of Onset  . Diabetes Sister   . Heart disease Maternal Grandmother   . Angina Maternal Grandmother   . Stroke Mother   . Suicidality Maternal Grandfather   . Cancer Paternal Grandmother   . Obesity Father   . Colon cancer Neg Hx   . Colon polyps Neg Hx   . Esophageal cancer Neg Hx   . Rectal cancer Neg Hx   . Stomach cancer Neg Hx   . Pancreatic cancer Neg Hx   . Liver disease Neg Hx     Allergies  Allergen Reactions  . Sulfonamide Derivatives Diarrhea  . Cocaine     Other reaction(s): Hallucination Sinus irrigation  . Codeine   . Montelukast Sodium   . Penicillins   . Pioglitazone   . Rosiglitazone Maleate   . Secobarbital Sodium   . Doxycycline Nausea Only    REACTION: GI UPSET    Current Outpatient Medications on File Prior to Visit  Medication Sig Dispense  Refill  . acetaminophen (TYLENOL) 650 MG CR tablet Take 650 mg by mouth every 8 (eight) hours as needed for pain.    . B-COMPLEX-C PO Take 1 tablet by mouth daily.    . cholecalciferol (VITAMIN D) 1000 UNITS tablet Take 1,000 Units by mouth daily.      . cyanocobalamin (,VITAMIN B-12,) 1000 MCG/ML injection INJECT 1ML INTO MUSCLE EVERY 30 DAYS 3 mL 0  . fluticasone (FLONASE) 50 MCG/ACT nasal spray Place 2 sprays into both nostrils daily. 16 g 1  . levothyroxine (SYNTHROID) 125 MCG tablet TAKE 1 TABLET EACH DAY. 90 tablet 3  . loperamide (IMODIUM) 2 MG capsule Take 2 mg by mouth as needed. 2 in the morning and 2 in the afternoon    . loratadine (CLARITIN) 10 MG tablet TAKE 1 TABLET ONCE DAILY. (Patient taking differently: TAKE 1 TABLET ONCE DAILY AS NEEDED) 30 tablet 0  . losartan (COZAAR) 50 MG tablet TAKE 1 TABLET ONCE DAILY. 90 tablet 0  . pantoprazole (PROTONIX) 40 MG tablet Take 1 tablet (40 mg total) by mouth daily. 90 tablet 3  . phenazopyridine (PYRIDIUM) 200 MG tablet Take 1 tablet (200 mg total) by mouth 3 (three) times daily as needed for pain. 6 tablet 0  . PROAIR HFA 108 (90 Base) MCG/ACT inhaler USE 2 PUFFS EVERY 6 HOURS AS NEEDED. 8.5 g 0  . Simethicone (GAS-X EXTRA STRENGTH) 62.5 MG STRP Take by mouth. As needed     . SYMBICORT 160-4.5 MCG/ACT inhaler USE 2 PUFFS TWICE A DAY. (Patient taking differently: USE 2 PUFFS TWICE A DAY. AS NEEDED) 10.2 g 0  . venlafaxine XR (EFFEXOR-XR) 75 MG 24 hr capsule Take 1 capsule (75 mg total) by mouth daily. 90 capsule 3  . metoprolol tartrate (LOPRESSOR) 25 MG tablet Take 1-2 tablets by mouth as needed for palpitations. (Patient not taking: Reported on 06/05/2018) 30 tablet 3   Current Facility-Administered Medications on File Prior to Visit  Medication Dose Route Frequency Provider Last Rate Last Dose  . 0.9 %  sodium chloride infusion  500 mL Intravenous Once Irene Shipper, MD        BP (!) 152/69 (BP Location: Right Arm, Cuff  Size: Large)    Pulse 78   Temp 99.4 F (37.4 C) (Oral)   Resp 18   Ht 5\' 8"  (1.727 m)   Wt 185 lb 6.4 oz (84.1 kg)   SpO2 99%   BMI 28.19 kg/m       Objective:   Physical Exam  Constitutional: She appears well-developed and well-nourished.  HENT:  Head: Normocephalic and atraumatic.  Right Ear: Tympanic membrane and ear canal normal.  Left Ear: Tympanic membrane and ear canal normal.  Mouth/Throat: No oropharyngeal exudate, posterior oropharyngeal edema or posterior oropharyngeal erythema.  Cardiovascular: Normal rate, regular rhythm and normal heart sounds.  No murmur heard. Pulmonary/Chest: Effort normal and breath sounds normal. No respiratory distress. She has no wheezes.  Psychiatric: She has a normal mood and affect. Her behavior is normal. Judgment and thought content normal.          Assessment & Plan:  Viral URI- rapid strep negative. Advised pt on continuing supportive measures and to let us know if symptoms worsen or if not improved in 3-4 days.

## 2018-06-05 NOTE — Patient Instructions (Signed)
Please call if new worsening symptoms or if symptoms are not improved in 3-4 days.    Viral Respiratory Infection A viral respiratory infection is an illness that affects parts of the body used for breathing, like the lungs, nose, and throat. It is caused by a germ called a virus. Some examples of this kind of infection are:  A cold.  The flu (influenza).  A respiratory syncytial virus (RSV) infection.  How do I know if I have this infection? Most of the time this infection causes:  A stuffy or runny nose.  Yellow or green fluid in the nose.  A cough.  Sneezing.  Tiredness (fatigue).  Achy muscles.  A sore throat.  Sweating or chills.  A fever.  A headache.  How is this infection treated? If the flu is diagnosed early, it may be treated with an antiviral medicine. This medicine shortens the length of time a person has symptoms. Symptoms may be treated with over-the-counter and prescription medicines, such as:  Expectorants. These make it easier to cough up mucus.  Decongestant nasal sprays.  Doctors do not prescribe antibiotic medicines for viral infections. They do not work with this kind of infection. How do I know if I should stay home? To keep others from getting sick, stay home if you have:  A fever.  A lasting cough.  A sore throat.  A runny nose.  Sneezing.  Muscles aches.  Headaches.  Tiredness.  Weakness.  Chills.  Sweating.  An upset stomach (nausea).  Follow these instructions at home:  Rest as much as possible.  Take over-the-counter and prescription medicines only as told by your doctor.  Drink enough fluid to keep your pee (urine) clear or pale yellow.  Gargle with salt water. Do this 3-4 times per day or as needed. To make a salt-water mixture, dissolve -1 tsp of salt in 1 cup of warm water. Make sure the salt dissolves all the way.  Use nose drops made from salt water. This helps with stuffiness (congestion). It also  helps soften the skin around your nose.  Do not drink alcohol.  Do not use tobacco products, including cigarettes, chewing tobacco, and e-cigarettes. If you need help quitting, ask your doctor. Get help if:  Your symptoms last for 10 days or longer.  Your symptoms get worse over time.  You have a fever.  You have very bad pain in your face or forehead.  Parts of your jaw or neck become very swollen. Get help right away if:  You feel pain or pressure in your chest.  You have shortness of breath.  You faint or feel like you will faint.  You keep throwing up (vomiting).  You feel confused. This information is not intended to replace advice given to you by your health care provider. Make sure you discuss any questions you have with your health care provider. Document Released: 10/28/2008 Document Revised: 04/22/2016 Document Reviewed: 04/23/2015 Elsevier Interactive Patient Education  2018 [employer].

## 2018-07-25 ENCOUNTER — Other Ambulatory Visit: Payer: Self-pay | Admitting: Family Medicine

## 2018-07-25 DIAGNOSIS — E538 Deficiency of other specified B group vitamins: Secondary | ICD-10-CM

## 2018-09-06 ENCOUNTER — Other Ambulatory Visit: Payer: Self-pay | Admitting: Family Medicine

## 2018-09-06 DIAGNOSIS — I10 Essential (primary) hypertension: Secondary | ICD-10-CM

## 2018-09-06 DIAGNOSIS — E039 Hypothyroidism, unspecified: Secondary | ICD-10-CM

## 2018-09-14 ENCOUNTER — Telehealth: Payer: Self-pay | Admitting: Family Medicine

## 2018-09-14 DIAGNOSIS — M810 Age-related osteoporosis without current pathological fracture: Secondary | ICD-10-CM

## 2018-09-14 NOTE — Telephone Encounter (Signed)
Copied from [place] [id number]. Topic: General - Other >> Sep 14, 2018 10:34 AM Lennox Solders wrote: Reason for CRM: pt is calling and would like to increase mg on generic effexor. Pt is having more anxiety and stress. [facility] in [place]. Pt has an appt in dec

## 2018-09-15 ENCOUNTER — Other Ambulatory Visit: Payer: Self-pay | Admitting: Family Medicine

## 2018-09-15 DIAGNOSIS — F419 Anxiety disorder, unspecified: Secondary | ICD-10-CM

## 2018-09-15 MED ORDER — VENLAFAXINE HCL ER 150 MG PO CP24: 150.0000 mg | ORAL_CAPSULE | Freq: Every day | ORAL | 3 refills | Status: DC

## 2018-09-15 NOTE — Telephone Encounter (Signed)
Sent in 150mg

## 2018-09-15 NOTE — Telephone Encounter (Signed)
Author phoned pt., no answer. Chief Strategy Officer then phoned daughter on [place] and notified her of effexor dosage increase. Author also asked it pt. Was still interested in prolia, and daughter stated she would follow up with pt. And have her get back to [place]. CMP order placed in the meantime.

## 2018-10-02 ENCOUNTER — Other Ambulatory Visit: Payer: Self-pay | Admitting: Family Medicine

## 2018-10-02 DIAGNOSIS — K219 Gastro-esophageal reflux disease without esophagitis: Secondary | ICD-10-CM

## 2018-10-11 ENCOUNTER — Other Ambulatory Visit (INDEPENDENT_AMBULATORY_CARE_PROVIDER_SITE_OTHER): Payer: Medicare Other

## 2018-10-11 DIAGNOSIS — M810 Age-related osteoporosis without current pathological fracture: Secondary | ICD-10-CM

## 2018-10-11 LAB — COMPREHENSIVE METABOLIC PANEL
ALK PHOS: 53 U/L (ref 39–117)
ALT: 29 U/L (ref 0–35)
AST: 26 U/L (ref 0–37)
Albumin: 4.3 g/dL (ref 3.5–5.2)
BUN: 14 mg/dL (ref 6–23)
CHLORIDE: 104 meq/L (ref 96–112)
CO2: 30 meq/L (ref 19–32)
Calcium: 9.6 mg/dL (ref 8.4–10.5)
Creatinine, Ser: 0.68 mg/dL (ref 0.40–1.20)
GFR: 89.56 mL/min (ref >60.00)
GLUCOSE: 120 mg/dL — AB (ref 70–99)
POTASSIUM: 4.7 meq/L (ref 3.5–5.1)
Sodium: 140 mEq/L (ref 135–145)
TOTAL PROTEIN: 6.6 g/dL (ref 6.0–8.3)
Total Bilirubin: 0.6 mg/dL (ref 0.2–1.2)

## 2018-10-17 ENCOUNTER — Telehealth: Payer: Self-pay | Admitting: *Deleted

## 2018-10-17 NOTE — Telephone Encounter (Signed)
Copied from [place] [id number]. Topic: General - Other >> Oct 17, 2018  1:23 PM Keene Breath wrote: Reason for CRM: Patient called to request that her blood test results be sent to her by  mail.  Patient stated that she could not get on My Chart to see them and wanted a hard copy.  Please advise.  CB# [phone]

## 2018-10-18 ENCOUNTER — Encounter: Payer: Self-pay | Admitting: *Deleted

## 2018-10-18 NOTE — Telephone Encounter (Signed)
Left message on machine that labs mailed.

## 2018-10-19 ENCOUNTER — Telehealth: Payer: Self-pay

## 2018-10-19 NOTE — Telephone Encounter (Signed)
Pt. Due for prolia 11/03/18 or after. Will confirm benefit verification with reps next week and schedule with pt. Once confirmed.

## 2018-10-31 ENCOUNTER — Ambulatory Visit: Payer: Medicare Other | Admitting: Family Medicine

## 2018-10-31 ENCOUNTER — Encounter: Payer: Self-pay | Admitting: Family Medicine

## 2018-10-31 VITALS — BP 125/54 | HR 77 | Temp 98.2°F | Resp 16 | Ht 68.0 in | Wt 187.2 lb

## 2018-10-31 DIAGNOSIS — F418 Other specified anxiety disorders: Secondary | ICD-10-CM | POA: Diagnosis not present

## 2018-10-31 DIAGNOSIS — E538 Deficiency of other specified B group vitamins: Secondary | ICD-10-CM

## 2018-10-31 DIAGNOSIS — Z23 Encounter for immunization: Secondary | ICD-10-CM | POA: Diagnosis not present

## 2018-10-31 DIAGNOSIS — M791 Myalgia, unspecified site: Secondary | ICD-10-CM | POA: Diagnosis not present

## 2018-10-31 DIAGNOSIS — E039 Hypothyroidism, unspecified: Secondary | ICD-10-CM

## 2018-10-31 DIAGNOSIS — E559 Vitamin D deficiency, unspecified: Secondary | ICD-10-CM

## 2018-10-31 DIAGNOSIS — F1020 Alcohol dependence, uncomplicated: Secondary | ICD-10-CM

## 2018-10-31 DIAGNOSIS — R5383 Other fatigue: Secondary | ICD-10-CM | POA: Diagnosis not present

## 2018-10-31 DIAGNOSIS — D508 Other iron deficiency anemias: Secondary | ICD-10-CM

## 2018-10-31 DIAGNOSIS — Z9884 Bariatric surgery status: Secondary | ICD-10-CM

## 2018-10-31 LAB — COMPREHENSIVE METABOLIC PANEL
ALK PHOS: 50 U/L (ref 39–117)
ALT: 27 U/L (ref 0–35)
AST: 24 U/L (ref 0–37)
Albumin: 4.4 g/dL (ref 3.5–5.2)
BUN: 10 mg/dL (ref 6–23)
CO2: 30 mEq/L (ref 19–32)
Calcium: 9.6 mg/dL (ref 8.4–10.5)
Chloride: 101 mEq/L (ref 96–112)
Creatinine, Ser: 0.63 mg/dL (ref 0.40–1.20)
GFR: 97.8 mL/min (ref >60.00)
Glucose, Bld: 114 mg/dL — ABNORMAL HIGH (ref 70–99)
POTASSIUM: 4.5 meq/L (ref 3.5–5.1)
Sodium: 137 mEq/L (ref 135–145)
TOTAL PROTEIN: 6.7 g/dL (ref 6.0–8.3)
Total Bilirubin: 0.5 mg/dL (ref 0.2–1.2)

## 2018-10-31 LAB — CBC WITH DIFFERENTIAL/PLATELET
Basophils Absolute: 0 10*3/uL (ref 0.0–0.1)
Basophils Relative: 0.9 % (ref 0.0–3.0)
EOS PCT: 2.6 % (ref 0.0–5.0)
Eosinophils Absolute: 0.1 10*3/uL (ref 0.0–0.7)
HCT: 35.2 % — ABNORMAL LOW (ref 36.0–46.0)
Hemoglobin: 11.9 g/dL — ABNORMAL LOW (ref 12.0–15.0)
LYMPHS ABS: 1.3 10*3/uL (ref 0.7–4.0)
Lymphocytes Relative: 28.1 % (ref 12.0–46.0)
MCHC: 33.9 g/dL (ref 30.0–36.0)
MCV: 96.7 fl (ref 78.0–100.0)
MONOS PCT: 6.8 % (ref 3.0–12.0)
Monocytes Absolute: 0.3 10*3/uL (ref 0.1–1.0)
NEUTROS ABS: 2.8 10*3/uL (ref 1.4–7.7)
Neutrophils Relative %: 61.6 % (ref 43.0–77.0)
PLATELETS: 192 10*3/uL (ref 150.0–400.0)
RBC: 3.64 Mil/uL — AB (ref 3.87–5.11)
RDW: 13.3 % (ref 11.5–15.5)
WBC: 4.6 10*3/uL (ref 4.0–10.5)

## 2018-10-31 LAB — VITAMIN D 25 HYDROXY (VIT D DEFICIENCY, FRACTURES): VITD: 38.68 ng/mL (ref 30.00–100.00)

## 2018-10-31 LAB — VITAMIN B12: Vitamin B-12: 442 pg/mL (ref 211–911)

## 2018-10-31 LAB — TSH: TSH: 1.86 u[IU]/mL (ref 0.35–4.50)

## 2018-10-31 MED ORDER — VENLAFAXINE HCL ER 37.5 MG PO CP24: ORAL_CAPSULE | ORAL | 3 refills | Status: DC

## 2018-10-31 NOTE — Assessment & Plan Note (Signed)
Check labs Pt c/o myalgia and fatigue

## 2018-10-31 NOTE — Assessment & Plan Note (Signed)
Pt struggling with weight loss after surgery Will refer to healthy weight and wellness program

## 2018-10-31 NOTE — Assessment & Plan Note (Signed)
Check labs today.

## 2018-10-31 NOTE — Assessment & Plan Note (Signed)
Check labs 

## 2018-10-31 NOTE — Assessment & Plan Note (Signed)
Pt has quit drinking

## 2018-10-31 NOTE — Telephone Encounter (Signed)
Prolia benefits verified. No PA needed; pt. may owe approximately  $70 OOP. Last injection received 05/02/18, due 11/03/18. Author phoned pt. to notify and set up [place]. No answer, with voicebox with name "mandy". Author then phoned chesley on [facility] and left  VM asking [place] to notify pt. About need to make appointment. Awaiting call-back.

## 2018-10-31 NOTE — Assessment & Plan Note (Signed)
Check labs con't meds 

## 2018-10-31 NOTE — Patient Instructions (Signed)

## 2018-10-31 NOTE — Progress Notes (Signed)
Patient ID: Stacy Ayers, female    DOB: 1941-06-08  Age: 77 y.o. MRN: [id number]    Subjective:  Subjective  HPI Stacy Ayers presents for f/u and c/o myalgias and fatigue.  She was dx with fibro many years ago and hopes this is not coming back.   C/o nose bleed last week that took 30 min to stop-- nurse at [place] helped her effexor dose is too strong--  And the lower dose was not strong enough.     Review of Systems  Constitutional: Positive for fatigue. Negative for appetite change, diaphoresis and unexpected weight change.  Eyes: Negative for pain, redness and visual disturbance.  Respiratory: Negative for cough, chest tightness, shortness of breath and wheezing.   Cardiovascular: Negative for chest pain, palpitations and leg swelling.  Endocrine: Negative for cold intolerance, heat intolerance, polydipsia, polyphagia and polyuria.  Genitourinary: Negative for difficulty urinating, dysuria and frequency.  Musculoskeletal: Positive for arthralgias and myalgias.  Neurological: Negative for dizziness, light-headedness, numbness and headaches.    History Past Medical History:  Diagnosis Date  . Abnormal liver function tests   . Allergy   . Anxiety   . Arthritis    knees, lower back   . Asthma   . Cataract    left eye growing cataract   . Depression   . Diabetes mellitus    had gastric bypass DM resolved-   . Diverticulosis   . GERD (gastroesophageal reflux disease)   . Heart murmur   . History of colon polyps    adenomatous  . Hyperlipidemia    past hx- resloved after gastric bypass   . Hypertension   . Hypothyroidism   . IBS (irritable bowel syndrome)   . Iron deficiency anemia   . Osteoporosis     She has a past surgical history that includes Abdominal hysterectomy; Appendectomy; Gastric bypass; Coronary angioplasty; Cholecystectomy; Tonsillectomy and adenoidectomy; Bladder repair; Abdominal hysterectomy; Colonoscopy; Polypectomy; and Cataract extraction  (Left).   Her family history includes Angina in her maternal grandmother; Cancer in her paternal grandmother; Diabetes in her sister; Heart disease in her maternal grandmother; Obesity in her father; Stroke in her mother; Suicidality in her maternal grandfather.She reports that she quit smoking about 56 years ago. Her smoking use included cigarettes. She has a 0.60 pack-year smoking history. She has never used smokeless tobacco. She reports that she drank alcohol. She reports that she does not use drugs.  Current Outpatient Medications on File Prior to Visit  Medication Sig Dispense Refill  . acetaminophen (TYLENOL) 650 MG CR tablet Take 650 mg by mouth every 8 (eight) hours as needed for pain.    . cholecalciferol (VITAMIN D) 1000 UNITS tablet Take 1,000 Units by mouth daily.      . cyanocobalamin (,VITAMIN B-12,) 1000 MCG/ML injection INJECT 1ML INTO MUSCLE EVERY 30 DAYS 3 mL 1  . denosumab (PROLIA) 60 MG/ML SOSY injection Inject 60 mg into the skin every 6 (six) months.    . fluticasone (FLONASE) 50 MCG/ACT nasal spray Place 2 sprays into both nostrils daily. 16 g 1  . levothyroxine (SYNTHROID) 125 MCG tablet TAKE 1 TABLET EACH DAY. 90 tablet 0  . loperamide (IMODIUM) 2 MG capsule Take 2 mg by mouth as needed. 2 in the morning and 2 in the afternoon    . loratadine (CLARITIN) 10 MG tablet TAKE 1 TABLET ONCE DAILY. (Patient taking differently: TAKE 1 TABLET ONCE DAILY AS NEEDED) 30 tablet 0  . losartan (COZAAR) 50 MG  tablet Take 1 tablet (50 mg total) by mouth daily. 90 tablet 0  . metoprolol tartrate (LOPRESSOR) 25 MG tablet Take 1-2 tablets by mouth as needed for palpitations. 30 tablet 3  . pantoprazole (PROTONIX) 40 MG tablet TAKE 1 TABLET ONCE DAILY. 90 tablet 1  . PROAIR HFA 108 (90 Base) MCG/ACT inhaler USE 2 PUFFS EVERY 6 HOURS AS NEEDED. 8.5 g 0  . Simethicone (GAS-X EXTRA STRENGTH) 62.5 MG STRP Take by mouth. As needed     . SYMBICORT 160-4.5 MCG/ACT inhaler USE 2 PUFFS TWICE A DAY.  (Patient taking differently: USE 2 PUFFS TWICE A DAY. AS NEEDED) 10.2 g 0   Current Facility-Administered Medications on File Prior to Visit  Medication Dose Route Frequency Provider Last Rate Last Dose  . 0.9 %  sodium chloride infusion  500 mL Intravenous Once Irene Shipper, MD         Objective:  Objective  Physical Exam  Constitutional: She is oriented to person, place, and time. She appears well-developed and well-nourished.  HENT:  Head: Normocephalic and atraumatic.  Eyes: Conjunctivae and EOM are normal.  Neck: Normal range of motion. Neck supple. No JVD present. Carotid bruit is not present. No thyromegaly present.  Cardiovascular: Normal rate, regular rhythm and normal heart sounds.  No murmur heard. Pulmonary/Chest: Effort normal and breath sounds normal. No respiratory distress. She has no wheezes. She has no rales. She exhibits no tenderness.  Musculoskeletal: She exhibits no edema.  Neurological: She is alert and oriented to person, place, and time.  Psychiatric: She has a normal mood and affect.  Nursing note and vitals reviewed.  BP (!) 125/54 (BP Location: Right Arm, Cuff Size: Large)   Pulse 77   Temp 98.2 F (36.8 C) (Oral)   Resp 16   Ht 5\' 8"  (1.727 m)   Wt 187 lb 3.2 oz (84.9 kg)   SpO2 97%   BMI 28.46 kg/m  Wt Readings from Last 3 Encounters:  10/31/18 187 lb 3.2 oz (84.9 kg)  06/05/18 185 lb 6.4 oz (84.1 kg)  05/02/18 186 lb 9.6 oz (84.6 kg)     Lab Results  Component Value Date   WBC 4.9 08/05/2017   HGB 12.3 08/05/2017   HCT 36.6 08/05/2017   PLT 190.0 08/05/2017   GLUCOSE 120 (H) 10/11/2018   CHOL 199 08/05/2017   TRIG 281.0 (H) 08/05/2017   HDL 50.80 08/05/2017   LDLDIRECT 116.0 08/05/2017   LDLCALC 91 08/06/2016   ALT 29 10/11/2018   AST 26 10/11/2018   NA 140 10/11/2018   K 4.7 10/11/2018   CL 104 10/11/2018   CREATININE 0.68 10/11/2018   BUN 14 10/11/2018   CO2 30 10/11/2018   TSH 3.16 08/05/2017   INR Positive 04/01/2017    HGBA1C 5.5 08/05/2017    Dg Bone Density  Result Date: 12/31/2016 EXAM: DUAL X-RAY ABSORPTIOMETRY (DXA) FOR BONE MINERAL DENSITY IMPRESSION: Referring Physician:  Rosalita Chessman Ayers PATIENT: Name: Stacy, Ayers Patient ID: [id number] Birth Date: [birth year] Height: 67.5 in. Sex: Female Measured: 12/31/2016 Weight: 182.8 lbs. Indications: Advanced Age, Bilateral Ovariectomy (65.51), Caucasian, Estrogen Deficient, Hypothyroid, Hysterectomy, Low Calcium Intake (269.3), Postmenopausal, Prilosec, Synthroid Fractures: None Treatments: None ASSESSMENT: The BMD measured at Femur Neck Left is 0.671 g/cm2 with a T-score of -2.6. This patient is considered OSTEOPOROTIC according to [facility]) criteria. L-3 and L-4 were excluded due to degenerative changes. Site Region Measured Date Measured Age YA T-score BMD Significant CHANGE DualFemur  Neck Left 12/31/2016    73.5         -2.6    0.671 g/cm2 AP Spine  L1-L2     14/31/2016    73.5         -1.5    0.991 g/cm2 World Health Organization [facility]) criteria for post-menopausal, Caucasian Women: Normal       T-score at or above -1 SD Osteopenia   T-score between -1 and -2.5 SD Osteoporosis T-score at or below -2.5 SD RECOMMENDATION: [place] recommends that FDA-approved medical therapies be considered in postmenopausal women and men age 77 or older with a: 1. Hip or vertebral (clinical or morphometric) fracture. 2. T-score of <-2.5 at the spine or hip. 3. Ten-year fracture probability by FRAX of 3% or greater for hip fracture or 20% or greater for major osteoporotic fracture. All treatment decisions require clinical judgment and consideration of individual patient factors, including patient preferences, co-morbidities, previous drug use, risk factors not captured in the FRAX model (e.g. falls, vitamin D deficiency, increased bone turnover, interval significant decline in bone density) and possible under - or over-estimation of fracture  risk by FRAX. All patients should ensure an adequate intake of dietary calcium (1200 mg/d) and vitamin D (800 IU daily) unless contraindicated. FOLLOW-UP: People with diagnosed cases of osteoporosis or at high risk for fracture should have regular bone mineral density tests. For patients eligible for Medicare, routine testing is allowed once every 2 years. The testing frequency can be increased to one year for patients who have rapidly progressing disease, those who are receiving or discontinuing medical therapy to restore bone mass, or have additional risk factors. I have reviewed this report, and agree with the above findings. Mark A. Thornton Papas, M.D. [facility] Electronically Signed   By: Lavonia Dana M.D.   On: 12/31/2016 11:11   Mm Digital Screening Bilateral  Result Date: 12/31/2016 CLINICAL DATA:  Screening. EXAM: DIGITAL SCREENING BILATERAL MAMMOGRAM WITH CAD COMPARISON:  Previous exam(s). ACR Breast Density Category b: There are scattered areas of fibroglandular density. FINDINGS: There are no findings suspicious for malignancy. Images were processed with CAD. IMPRESSION: No mammographic evidence of malignancy. A result letter of this screening mammogram will be mailed directly to the patient. RECOMMENDATION: Screening mammogram in one year. (Code:SM-B-01Y) BI-RADS CATEGORY  1: Negative. Electronically Signed   By: Margarette Canada M.D.   On: 12/31/2016 13:18     Assessment & Plan:  Plan  I have discontinued Stacy Ayers's B-COMPLEX-C PO, phenazopyridine, and venlafaxine XR. I am also having her start on venlafaxine XR. Additionally, I am having her maintain her Simethicone, loperamide, cholecalciferol, acetaminophen, SYMBICORT, PROAIR HFA, fluticasone, metoprolol tartrate, loratadine, cyanocobalamin, losartan, levothyroxine, pantoprazole, and denosumab. We will continue to administer sodium chloride.  Meds ordered this encounter  Medications  . venlafaxine XR (EFFEXOR XR) 37.5 MG 24 hr capsule     Sig: 3 po qd    Dispense:  90 capsule    Refill:  3    Problem List Items Addressed This Visit      Unprioritized   Alcohol dependence ([place])    Pt has quit drinking       ANEMIA-IRON DEFICIENCY    Check labs today      B12 deficiency    Check labs      BARIATRIC SURGERY STATUS    Pt struggling with weight loss after surgery Will refer to healthy weight and wellness program       Depression with  anxiety - Primary    effexor changed to 37.5  3 po qd F/u 3 months or sooner prn      Relevant Medications   venlafaxine XR (EFFEXOR XR) 37.5 MG 24 hr capsule   Hypothyroidism    Check labs  con't meds      Relevant Orders   TSH   Vitamin D deficiency    Check labs Pt c/o myalgia and fatigue       Other Visit Diagnoses    Fatigue, unspecified type       Relevant Orders   TSH   Vitamin B12   Vitamin D (25 hydroxy)   CBC with Differential/Platelet   Comprehensive metabolic panel   Myalgia       Relevant Orders   TSH   Vitamin B12   Vitamin D (25 hydroxy)   CBC with Differential/Platelet   Comprehensive metabolic panel   Morbid obesity (HCC)       Relevant Orders   Amb Ref to Medical Weight Management   Need for influenza vaccination       Relevant Orders   Flu vaccine HIGH DOSE PF (Fluzone High dose) (Completed)      Follow-up: Return in about 6 months (around 05/02/2019), or if symptoms worsen or fail to improve.  [name], DO

## 2018-10-31 NOTE — Assessment & Plan Note (Signed)
effexor changed to 37.5  3 po qd F/u 3 months or sooner prn

## 2018-11-02 NOTE — Telephone Encounter (Signed)
Patient scheduled for 11/08/18 at 915am.  Can we order medication?

## 2018-11-02 NOTE — Telephone Encounter (Signed)
Prolia assigned in fridge to patient.

## 2018-11-03 ENCOUNTER — Telehealth: Payer: Self-pay

## 2018-11-03 NOTE — Telephone Encounter (Signed)
Copied from [place] [id number]. Topic: General - Other >> Nov 03, 2018  3:39 PM Oneta Rack wrote: Osvaldo Human name: Mliss Sax  Relation to pt: Clinic RN [facility] Call back number: [phone]    Reason for call:  Requesting MD orders for monthly B12 injection  or note d/c B12 If necessary fax #  [phone]

## 2018-11-07 NOTE — Telephone Encounter (Signed)
Do you want to continue B12?

## 2018-11-07 NOTE — Telephone Encounter (Signed)
Stacy Ayers is calling to request a  Update on the order for monthly B12  Injection. Please advise

## 2018-11-08 ENCOUNTER — Ambulatory Visit (INDEPENDENT_AMBULATORY_CARE_PROVIDER_SITE_OTHER): Payer: Medicare Other

## 2018-11-08 ENCOUNTER — Ambulatory Visit: Payer: Medicare Other

## 2018-11-08 DIAGNOSIS — M791 Myalgia, unspecified site: Secondary | ICD-10-CM | POA: Diagnosis not present

## 2018-11-08 DIAGNOSIS — M810 Age-related osteoporosis without current pathological fracture: Secondary | ICD-10-CM | POA: Diagnosis not present

## 2018-11-08 MED ORDER — DENOSUMAB 60 MG/ML ~~LOC~~ SOSY
60.0000 mg | PREFILLED_SYRINGE | Freq: Once | SUBCUTANEOUS | Status: AC
  Administered 2018-11-08: 60 mg via SUBCUTANEOUS

## 2018-11-08 NOTE — Telephone Encounter (Signed)
b12 is in the normal range ---  She can con't b12 if she likes---- or she can try sublingual b12 daily and we can recheck in 3 months to see if it stays study

## 2018-11-10 ENCOUNTER — Ambulatory Visit: Payer: Medicare Other | Admitting: Family Medicine

## 2018-11-13 ENCOUNTER — Ambulatory Visit: Payer: Medicare Other | Admitting: Family Medicine

## 2018-11-13 ENCOUNTER — Encounter: Payer: Self-pay | Admitting: Family Medicine

## 2018-11-13 VITALS — BP 130/66 | HR 65 | Temp 98.1°F | Resp 16 | Ht 68.0 in | Wt 185.4 lb

## 2018-11-13 DIAGNOSIS — J069 Acute upper respiratory infection, unspecified: Secondary | ICD-10-CM

## 2018-11-13 DIAGNOSIS — D649 Anemia, unspecified: Secondary | ICD-10-CM | POA: Diagnosis not present

## 2018-11-13 DIAGNOSIS — E559 Vitamin D deficiency, unspecified: Secondary | ICD-10-CM | POA: Diagnosis not present

## 2018-11-13 NOTE — Patient Instructions (Signed)
Upper Respiratory Infection, Adult Most upper respiratory infections (URIs) are caused by a virus. A URI affects the nose, throat, and upper air passages. The most common type of URI is often called "the common cold." Follow these instructions at home:  Take medicines only as told by your doctor.  Gargle warm saltwater or take cough drops to comfort your throat as told by your doctor.  Use a warm mist humidifier or inhale steam from a shower to increase air moisture. This may make it easier to breathe.  Drink enough fluid to keep your pee (urine) clear or pale yellow.  Eat soups and other clear broths.  Have a healthy diet.  Rest as needed.  Go back to work when your fever is gone or your doctor says it is okay. ? You may need to stay home longer to avoid giving your URI to others. ? You can also wear a face mask and wash your hands often to prevent spread of the virus.  Use your inhaler more if you have asthma.  Do not use any tobacco products, including cigarettes, chewing tobacco, or electronic cigarettes. If you need help quitting, ask your doctor. Contact a doctor if:  You are getting worse, not better.  Your symptoms are not helped by medicine.  You have chills.  You are getting more short of breath.  You have brown or red mucus.  You have yellow or brown discharge from your nose.  You have pain in your face, especially when you bend forward.  You have a fever.  You have puffy (swollen) neck glands.  You have pain while swallowing.  You have white areas in the back of your throat. Get help right away if:  You have very bad or constant: ? Headache. ? Ear pain. ? Pain in your forehead, behind your eyes, and over your cheekbones (sinus pain). ? Chest pain.  You have long-lasting (chronic) lung disease and any of the following: ? Wheezing. ? Long-lasting cough. ? Coughing up blood. ? A change in your usual mucus.  You have a stiff neck.  You have  changes in your: ? Vision. ? Hearing. ? Thinking. ? Mood. This information is not intended to replace advice given to you by your health care provider. Make sure you discuss any questions you have with your health care provider. Document Released: 05/03/2008 Document Revised: 07/18/2016 Document Reviewed: 02/20/2014 Elsevier Interactive Patient Education  2018 [employer]  

## 2018-11-13 NOTE — Progress Notes (Signed)
Patient ID: Stacy Ayers, female    DOB: 05/12/1941  Age: 77 y.o. MRN: [id number]    Subjective:  Subjective  HPI Stacy Ayers presents for c/o sinus headache , L ear pain and scratchy throat--- she started claritin and flonase with tylenol which is helping.   She is concerned because her daughter has shingles and she is afraid it is shingles   Review of Systems  Constitutional: Negative for appetite change, chills, diaphoresis, fatigue, fever and unexpected weight change.  HENT: Positive for congestion, postnasal drip, rhinorrhea, sinus pressure and sore throat.   Eyes: Negative for pain, redness and visual disturbance.  Respiratory: Negative for cough, chest tightness, shortness of breath and wheezing.   Cardiovascular: Negative for chest pain, palpitations and leg swelling.  Endocrine: Negative for cold intolerance, heat intolerance, polydipsia, polyphagia and polyuria.  Genitourinary: Negative for difficulty urinating, dysuria and frequency.  Skin: Negative for rash.  Allergic/Immunologic: Negative for environmental allergies.  Neurological: Negative for dizziness, light-headedness, numbness and headaches.    History Past Medical History:  Diagnosis Date  . Abnormal liver function tests   . Allergy   . Anxiety   . Arthritis    knees, lower back   . Asthma   . Cataract    left eye growing cataract   . Depression   . Diabetes mellitus    had gastric bypass DM resolved-   . Diverticulosis   . GERD (gastroesophageal reflux disease)   . Heart murmur   . History of colon polyps    adenomatous  . Hyperlipidemia    past hx- resloved after gastric bypass   . Hypertension   . Hypothyroidism   . IBS (irritable bowel syndrome)   . Iron deficiency anemia   . Osteoporosis     She has a past surgical history that includes Abdominal hysterectomy; Appendectomy; Gastric bypass; Coronary angioplasty; Cholecystectomy; Tonsillectomy and adenoidectomy; Bladder repair; Abdominal  hysterectomy; Colonoscopy; Polypectomy; and Cataract extraction (Left).   Her family history includes Angina in her maternal grandmother; Cancer in her paternal grandmother; Diabetes in her sister; Heart disease in her maternal grandmother; Obesity in her father; Stroke in her mother; Suicidality in her maternal grandfather.She reports that she quit smoking about 56 years ago. Her smoking use included cigarettes. She has a 0.60 pack-year smoking history. She has never used smokeless tobacco. She reports previous alcohol use. She reports that she does not use drugs.  Current Outpatient Medications on File Prior to Visit  Medication Sig Dispense Refill  . acetaminophen (TYLENOL) 650 MG CR tablet Take 650 mg by mouth every 8 (eight) hours as needed for pain.    . cholecalciferol (VITAMIN D) 1000 UNITS tablet Take 1,000 Units by mouth daily.      . cyanocobalamin (,VITAMIN B-12,) 1000 MCG/ML injection INJECT 1ML INTO MUSCLE EVERY 30 DAYS 3 mL 1  . denosumab (PROLIA) 60 MG/ML SOSY injection Inject 60 mg into the skin every 6 (six) months.    . fluticasone (FLONASE) 50 MCG/ACT nasal spray Place 2 sprays into both nostrils daily. 16 g 1  . levothyroxine (SYNTHROID) 125 MCG tablet TAKE 1 TABLET EACH DAY. 90 tablet 0  . loperamide (IMODIUM) 2 MG capsule Take 2 mg by mouth as needed. 2 in the morning and 2 in the afternoon    . loratadine (CLARITIN) 10 MG tablet TAKE 1 TABLET ONCE DAILY. (Patient taking differently: TAKE 1 TABLET ONCE DAILY AS NEEDED) 30 tablet 0  . losartan (COZAAR) 50 MG tablet Take  1 tablet (50 mg total) by mouth daily. 90 tablet 0  . metoprolol tartrate (LOPRESSOR) 25 MG tablet Take 1-2 tablets by mouth as needed for palpitations. 30 tablet 3  . pantoprazole (PROTONIX) 40 MG tablet TAKE 1 TABLET ONCE DAILY. 90 tablet 1  . PROAIR HFA 108 (90 Base) MCG/ACT inhaler USE 2 PUFFS EVERY 6 HOURS AS NEEDED. 8.5 g 0  . Simethicone (GAS-X EXTRA STRENGTH) 62.5 MG STRP Take by mouth. As needed     .  SYMBICORT 160-4.5 MCG/ACT inhaler USE 2 PUFFS TWICE A DAY. (Patient taking differently: USE 2 PUFFS TWICE A DAY. AS NEEDED) 10.2 g 0  . venlafaxine XR (EFFEXOR XR) 37.5 MG 24 hr capsule 3 po qd 90 capsule 3   Current Facility-Administered Medications on File Prior to Visit  Medication Dose Route Frequency Provider Last Rate Last Dose  . 0.9 %  sodium chloride infusion  500 mL Intravenous Once Irene Shipper, MD         Objective:  Objective  Physical Exam Vitals signs and nursing note reviewed.  Constitutional:      Appearance: She is well-developed.  HENT:     Right Ear: Tympanic membrane, ear canal and external ear normal.     Left Ear: Tympanic membrane, ear canal and external ear normal.     Nose:     Right Sinus: Maxillary sinus tenderness and frontal sinus tenderness present.     Left Sinus: Maxillary sinus tenderness and frontal sinus tenderness present.     Mouth/Throat:     Pharynx: Posterior oropharyngeal erythema present. No pharyngeal swelling, oropharyngeal exudate or uvula swelling.     Tonsils: No tonsillar exudate.  Eyes:     General:        Right eye: No discharge.        Left eye: No discharge.     Conjunctiva/sclera: Conjunctivae normal.  Cardiovascular:     Rate and Rhythm: Normal rate and regular rhythm.     Heart sounds: Normal heart sounds. No murmur.  Pulmonary:     Effort: Pulmonary effort is normal. No respiratory distress.     Breath sounds: Normal breath sounds. No wheezing or rales.  Chest:     Chest wall: No tenderness.  Lymphadenopathy:     Cervical: Cervical adenopathy present.  Skin:    Findings: No rash.  Neurological:     Mental Status: She is alert and oriented to person, place, and time.    BP 130/66 (BP Location: Right Arm, Cuff Size: Large)   Pulse 65   Temp 98.1 F (36.7 C) (Oral)   Resp 16   Ht 5\' 8"  (1.727 m)   Wt 185 lb 6.4 oz (84.1 kg)   SpO2 97%   BMI 28.19 kg/m  Wt Readings from Last 3 Encounters:  11/13/18 185 lb 6.4  oz (84.1 kg)  10/31/18 187 lb 3.2 oz (84.9 kg)  06/05/18 185 lb 6.4 oz (84.1 kg)     Lab Results  Component Value Date   WBC 4.6 10/31/2018   HGB 11.9 (L) 10/31/2018   HCT 35.2 (L) 10/31/2018   PLT 192.0 10/31/2018   GLUCOSE 114 (H) 10/31/2018   CHOL 199 08/05/2017   TRIG 281.0 (H) 08/05/2017   HDL 50.80 08/05/2017   LDLDIRECT 116.0 08/05/2017   LDLCALC 91 08/06/2016   ALT 27 10/31/2018   AST 24 10/31/2018   NA 137 10/31/2018   K 4.5 10/31/2018   CL 101 10/31/2018   CREATININE 0.63  10/31/2018   BUN 10 10/31/2018   CO2 30 10/31/2018   TSH 1.86 10/31/2018   INR Positive 04/01/2017   HGBA1C 5.5 08/05/2017    Dg Bone Density  Result Date: 12/31/2016 EXAM: DUAL X-RAY ABSORPTIOMETRY (DXA) FOR BONE MINERAL DENSITY IMPRESSION: Referring Physician:  [name] PATIENT: Name: Taneil, Lazarus Patient ID: [id number] Birth Date: 20-Jul-1941 Height: 67.5 in. Sex: Female Measured: 12/31/2016 Weight: 182.8 lbs. Indications: Advanced Age, Bilateral Ovariectomy (65.51), Caucasian, Estrogen Deficient, Hypothyroid, Hysterectomy, Low Calcium Intake (269.3), Postmenopausal, Prilosec, Synthroid Fractures: None Treatments: None ASSESSMENT: The BMD measured at Femur Neck Left is 0.671 g/cm2 with a T-score of -2.6. This patient is considered OSTEOPOROTIC according to [facility]) criteria. L-3 and L-4 were excluded due to degenerative changes. Site Region Measured Date Measured Age YA T-score BMD Significant CHANGE DualFemur Neck Left 12/31/2016    73.5         -2.6    0.671 g/cm2 AP Spine  L1-L2     14/31/2016    73.5         -1.5    0.991 g/cm2 World Health Organization [facility]) criteria for post-menopausal, Caucasian Women: Normal       T-score at or above -1 SD Osteopenia   T-score between -1 and -2.5 SD Osteoporosis T-score at or below -2.5 SD RECOMMENDATION: [place] recommends that FDA-approved medical therapies be considered in postmenopausal women and men  age 82 or older with a: 1. Hip or vertebral (clinical or morphometric) fracture. 2. T-score of <-2.5 at the spine or hip. 3. Ten-year fracture probability by FRAX of 3% or greater for hip fracture or 20% or greater for major osteoporotic fracture. All treatment decisions require clinical judgment and consideration of individual patient factors, including patient preferences, co-morbidities, previous drug use, risk factors not captured in the FRAX model (e.g. falls, vitamin D deficiency, increased bone turnover, interval significant decline in bone density) and possible under - or over-estimation of fracture risk by FRAX. All patients should ensure an adequate intake of dietary calcium (1200 mg/d) and vitamin D (800 IU daily) unless contraindicated. FOLLOW-UP: People with diagnosed cases of osteoporosis or at high risk for fracture should have regular bone mineral density tests. For patients eligible for Medicare, routine testing is allowed once every 2 years. The testing frequency can be increased to one year for patients who have rapidly progressing disease, those who are receiving or discontinuing medical therapy to restore bone mass, or have additional risk factors. I have reviewed this report, and agree with the above findings. Mark A. Thornton Papas, M.D. [facility] Radiology Electronically Signed   By: Lavonia Dana M.D.   On: 12/31/2016 11:11   Mm Digital Screening Bilateral  Result Date: 12/31/2016 CLINICAL DATA:  Screening. EXAM: DIGITAL SCREENING BILATERAL MAMMOGRAM WITH CAD COMPARISON:  Previous exam(s). ACR Breast Density Category b: There are scattered areas of fibroglandular density. FINDINGS: There are no findings suspicious for malignancy. Images were processed with CAD. IMPRESSION: No mammographic evidence of malignancy. A result letter of this screening mammogram will be mailed directly to the patient. RECOMMENDATION: Screening mammogram in one year. (Code:SM-B-01Y) BI-RADS CATEGORY  1: Negative.  Electronically Signed   By: Margarette Canada M.D.   On: 12/31/2016 13:18     Assessment & Plan:  Plan  I am having Stacy Ayers maintain her Simethicone, loperamide, cholecalciferol, acetaminophen, SYMBICORT, PROAIR HFA, fluticasone, metoprolol tartrate, loratadine, cyanocobalamin, losartan, levothyroxine, pantoprazole, denosumab, and venlafaxine XR. We will continue to administer  sodium chloride.  No orders of the defined types were placed in this encounter.   Problem List Items Addressed This Visit      Unprioritized   Viral upper respiratory tract infection    Take otc antihistamine and steroid nasal spray Call or rto prn       Vitamin D deficiency - Primary   Relevant Orders   VITAMIN D 25 Hydroxy (Vit-D Deficiency, Fractures)    Other Visit Diagnoses    Anemia, unspecified type       Relevant Orders   CBC with Differential/Platelet   Ferritin   IBC panel      Follow-up: Return if symptoms worsen or fail to improve.  [name], DO

## 2018-11-14 ENCOUNTER — Other Ambulatory Visit: Payer: Self-pay | Admitting: *Deleted

## 2018-11-14 DIAGNOSIS — E538 Deficiency of other specified B group vitamins: Secondary | ICD-10-CM

## 2018-11-14 DIAGNOSIS — J069 Acute upper respiratory infection, unspecified: Secondary | ICD-10-CM | POA: Insufficient documentation

## 2018-11-14 NOTE — Assessment & Plan Note (Signed)
Take otc antihistamine and steroid nasal spray Call or rto prn

## 2018-11-14 NOTE — Telephone Encounter (Signed)
[facility] and [facility].  Patient stated that she has 2 vials left.  She will use those and then go to the sublingual.  She will recheck in 3 months.

## 2018-11-16 ENCOUNTER — Encounter: Payer: Self-pay | Admitting: Family Medicine

## 2018-12-12 ENCOUNTER — Telehealth: Payer: Self-pay | Admitting: *Deleted

## 2018-12-12 NOTE — Telephone Encounter (Signed)
Received Physician Orders from [place]; forwarded to provider/SLS 01/14

## 2018-12-16 ENCOUNTER — Other Ambulatory Visit: Payer: Self-pay | Admitting: Family Medicine

## 2018-12-16 DIAGNOSIS — E039 Hypothyroidism, unspecified: Secondary | ICD-10-CM

## 2018-12-16 DIAGNOSIS — I10 Essential (primary) hypertension: Secondary | ICD-10-CM

## 2019-03-28 ENCOUNTER — Other Ambulatory Visit: Payer: Self-pay | Admitting: Medical

## 2019-03-28 ENCOUNTER — Other Ambulatory Visit: Payer: Self-pay | Admitting: Family Medicine

## 2019-03-28 DIAGNOSIS — J302 Other seasonal allergic rhinitis: Secondary | ICD-10-CM

## 2019-03-28 DIAGNOSIS — K219 Gastro-esophageal reflux disease without esophagitis: Secondary | ICD-10-CM

## 2019-05-14 ENCOUNTER — Telehealth: Payer: Self-pay

## 2019-05-14 NOTE — Telephone Encounter (Signed)
Called patient and left voicemail to schedule prolia injection. Patient is also due for lab work to check calcium level. Advised to call back.

## 2019-05-18 NOTE — Telephone Encounter (Signed)
°  Relation to pt: self  Call back number: [phone]    Reason for call:  Patient states she [facility] at [facility] and states she's hesitant to come to appt due to the pandemic and would like to know if time is sensitive regarding receiving the prolia injection, please advise

## 2019-05-19 ENCOUNTER — Other Ambulatory Visit: Payer: Self-pay | Admitting: Family Medicine

## 2019-05-19 DIAGNOSIS — F418 Other specified anxiety disorders: Secondary | ICD-10-CM

## 2019-05-19 DIAGNOSIS — E039 Hypothyroidism, unspecified: Secondary | ICD-10-CM

## 2019-05-21 ENCOUNTER — Other Ambulatory Visit: Payer: Self-pay | Admitting: Family Medicine

## 2019-05-21 DIAGNOSIS — F418 Other specified anxiety disorders: Secondary | ICD-10-CM

## 2019-05-21 DIAGNOSIS — E039 Hypothyroidism, unspecified: Secondary | ICD-10-CM

## 2019-05-22 NOTE — Telephone Encounter (Signed)
Patient has been scheduled for prolia tomorrow.

## 2019-05-23 ENCOUNTER — Ambulatory Visit (INDEPENDENT_AMBULATORY_CARE_PROVIDER_SITE_OTHER): Payer: Medicare Other

## 2019-05-23 ENCOUNTER — Other Ambulatory Visit: Payer: Self-pay

## 2019-05-23 DIAGNOSIS — M810 Age-related osteoporosis without current pathological fracture: Secondary | ICD-10-CM | POA: Diagnosis not present

## 2019-05-23 MED ORDER — DENOSUMAB 60 MG/ML ~~LOC~~ SOSY
60.0000 mg | PREFILLED_SYRINGE | Freq: Once | SUBCUTANEOUS | Status: AC
  Administered 2019-05-23: 12:00:00 60 mg via SUBCUTANEOUS

## 2019-05-23 NOTE — Progress Notes (Addendum)
Prolia injection administered w/o any difficulties.     [name], DO

## 2019-05-28 ENCOUNTER — Other Ambulatory Visit: Payer: Self-pay

## 2019-05-28 ENCOUNTER — Other Ambulatory Visit (INDEPENDENT_AMBULATORY_CARE_PROVIDER_SITE_OTHER): Payer: Medicare Other

## 2019-05-28 ENCOUNTER — Other Ambulatory Visit: Payer: Self-pay | Admitting: Family Medicine

## 2019-05-28 DIAGNOSIS — E039 Hypothyroidism, unspecified: Secondary | ICD-10-CM

## 2019-05-28 DIAGNOSIS — I10 Essential (primary) hypertension: Secondary | ICD-10-CM

## 2019-05-28 NOTE — Addendum Note (Signed)
Addended by: Kelle Darting A on: 05/28/2019 03:54 PM   Modules accepted: Orders

## 2019-05-29 LAB — COMPREHENSIVE METABOLIC PANEL
ALT: 23 U/L (ref 0–35)
AST: 23 U/L (ref 0–37)
Albumin: 4.4 g/dL (ref 3.5–5.2)
Alkaline Phosphatase: 63 U/L (ref 39–117)
BUN: 13 mg/dL (ref 6–23)
CO2: 27 mEq/L (ref 19–32)
Calcium: 9.5 mg/dL (ref 8.4–10.5)
Chloride: 104 mEq/L (ref 96–112)
Creatinine, Ser: 0.76 mg/dL (ref 0.40–1.20)
GFR: 73.99 mL/min (ref >60.00)
Glucose, Bld: 173 mg/dL — ABNORMAL HIGH (ref 70–99)
Potassium: 4.8 mEq/L (ref 3.5–5.1)
Sodium: 138 mEq/L (ref 135–145)
Total Bilirubin: 0.4 mg/dL (ref 0.2–1.2)
Total Protein: 6.9 g/dL (ref 6.0–8.3)

## 2019-05-29 LAB — LDL CHOLESTEROL, DIRECT: Direct LDL: 147 mg/dL

## 2019-05-29 LAB — THYROID PANEL WITH TSH
Free Thyroxine Index: 2.2 (ref 1.4–3.8)
T3 Uptake: 28 % (ref 22–35)
T4, Total: 7.9 ug/dL (ref 5.1–11.9)
TSH: 6.95 mIU/L — ABNORMAL HIGH (ref 0.40–4.50)

## 2019-05-29 LAB — LIPID PANEL
Cholesterol: 212 mg/dL — ABNORMAL HIGH (ref 0–200)
HDL: 41.2 mg/dL (ref >39.00)
NonHDL: 171.09
Total CHOL/HDL Ratio: 5
Triglycerides: 347 mg/dL — ABNORMAL HIGH (ref 0.0–149.0)
VLDL: 69.4 mg/dL — ABNORMAL HIGH (ref 0.0–40.0)

## 2019-06-03 ENCOUNTER — Other Ambulatory Visit: Payer: Self-pay | Admitting: Family Medicine

## 2019-06-03 DIAGNOSIS — E785 Hyperlipidemia, unspecified: Secondary | ICD-10-CM

## 2019-06-07 NOTE — Progress Notes (Signed)
Virtual Visit via Video Note  I connected with patient on 06/08/19 at  3:00 PM EDT by audio enabled telemedicine application and verified that I am speaking with the correct person using two identifiers.   THIS ENCOUNTER IS A VIRTUAL VISIT DUE TO COVID-19 - PATIENT WAS NOT SEEN IN THE OFFICE. PATIENT HAS CONSENTED TO VIRTUAL VISIT / TELEMEDICINE VISIT   Location of patient: home  Location of provider: office  I discussed the limitations of evaluation and management by telemedicine and the availability of in person appointments. The patient expressed understanding and agreed to proceed.   Subjective:   Stacy Ayers is a 78 y.o. female who presents for Medicare Annual (Subsequent) preventive examination.  Review of Systems: No ROS.  Medicare Wellness Virtual Visit.  Visual/audio telehealth visit, UTA vital signs.   See social history for additional risk factors. Cardiac Risk Factors include: advanced age (>54men, >75 women);dyslipidemia;hypertension Sleep patterns: no issues Home Safety/Smoke Alarms: Feels safe in home. Smoke alarms in place.  Lives alone with 47 yo dog at [facility].    Female:   Mammo-  ordered     Dexa scan- ordered [facility]- 12/27/18. No longer doing routine screening due to age.     Objective:     Vitals: BP 124/73 Comment: pt reports vitals  Pulse 79   There is no height or weight on file to calculate BMI.  Advanced Directives 06/08/2019 10/25/2017 04/07/2017 08/06/2016 08/08/2015  Does Patient Have a Medical Advance Directive? Yes Yes Yes Yes No  Type of Paramedic of [place];Living will [place];Living will [place];Living will [place];Living will -  Does patient want to make changes to medical advance directive? No - Patient declined - - No - Patient declined -  Copy of [place] in Chart? No - copy requested No - copy requested No - copy requested No -  copy requested -  Would patient like information on creating a medical advance directive? - - - - No - patient declined information    Tobacco Social History   Tobacco Use  Smoking Status Former Smoker  . Packs/day: 0.30  . Years: 2.00  . Pack years: 0.60  . Types: Cigarettes  . Quit date: 07/11/1962  . Years since quitting: 56.9  Smokeless Tobacco Never Used     Counseling given: Not Answered   Clinical Intake: Pain : No/denies pain   Past Medical History:  Diagnosis Date  . Abnormal liver function tests   . Allergy   . Anxiety   . Arthritis    knees, lower back   . Asthma   . Cataract    left eye growing cataract   . Depression   . Diabetes mellitus    had gastric bypass DM resolved-   . Diverticulosis   . GERD (gastroesophageal reflux disease)   . Heart murmur   . History of colon polyps    adenomatous  . Hyperlipidemia    past hx- resloved after gastric bypass   . Hypertension   . Hypothyroidism   . IBS (irritable bowel syndrome)   . Iron deficiency anemia   . Osteoporosis    Past Surgical History:  Procedure Laterality Date  . ABDOMINAL HYSTERECTOMY    . ABDOMINAL HYSTERECTOMY    . APPENDECTOMY    . BLADDER REPAIR    . CATARACT EXTRACTION Left   . CHOLECYSTECTOMY    . COLONOSCOPY    . CORONARY  ANGIOPLASTY    . GASTRIC BYPASS    . POLYPECTOMY    . TONSILLECTOMY AND ADENOIDECTOMY     Family History  Problem Relation Age of Onset  . Diabetes Sister   . Heart disease Maternal Grandmother   . Angina Maternal Grandmother   . Stroke Mother   . Suicidality Maternal Grandfather   . Cancer Paternal Grandmother   . Obesity Father   . Colon cancer Neg Hx   . Colon polyps Neg Hx   . Esophageal cancer Neg Hx   . Rectal cancer Neg Hx   . Stomach cancer Neg Hx   . Pancreatic cancer Neg Hx   . Liver disease Neg Hx    Social History   Socioeconomic History  . Marital status: Single    Spouse name: Not on file  . Number of children: 1  . Years  of education: Not on file  . Highest education level: Not on file  Occupational History  . Occupation: retired Product manager: RETIRED  Social Needs  . Financial resource strain: Not on file  . Food insecurity    Worry: Not on file    Inability: Not on file  . Transportation needs    Medical: Not on file    Non-medical: Not on file  Tobacco Use  . Smoking status: Former Smoker    Packs/day: 0.30    Years: 2.00    Pack years: 0.60    Types: Cigarettes    Quit date: 07/11/1962    Years since quitting: 56.9  . Smokeless tobacco: Never Used  Substance and Sexual Activity  . Alcohol use: Not Currently    Comment: quit 2 and 1/2 months ago around 9/19  . Drug use: No  . Sexual activity: Yes  Lifestyle  . Physical activity    Days per week: Not on file    Minutes per session: Not on file  . Stress: Not on file  Relationships  . Social Herbalist on phone: Not on file    Gets together: Not on file    Attends religious service: Not on file    Active member of club or organization: Not on file    Attends meetings of clubs or organizations: Not on file    Relationship status: Not on file  Other Topics Concern  . Not on file  Social History Narrative   Daily caffeine    Outpatient Encounter Medications as of 06/08/2019  Medication Sig  . cholecalciferol (VITAMIN D) 1000 UNITS tablet Take 1,000 Units by mouth daily.    Marland Kitchen denosumab (PROLIA) 60 MG/ML SOSY injection Inject 60 mg into the skin every 6 (six) months.  . fluticasone (FLONASE) 50 MCG/ACT nasal spray USE 2 SPRAYS IN EACH NOSTRIL ONCE DAILY  . levothyroxine (SYNTHROID) 125 MCG tablet Take 1 tablet (125 mcg total) by mouth daily before breakfast.  . loperamide (IMODIUM) 2 MG capsule Take 2 mg by mouth as needed. 2 in the morning and 2 in the afternoon  . loratadine (CLARITIN) 10 MG tablet TAKE 1 TABLET ONCE DAILY.  Marland Kitchen losartan (COZAAR) 50 MG tablet TAKE 1 TABLET ONCE DAILY.  . pantoprazole (PROTONIX) 40 MG  tablet TAKE 1 TABLET ONCE DAILY.  Marland Kitchen venlafaxine XR (EFFEXOR-XR) 37.5 MG 24 hr capsule Take 3 capsules (112.5 mg total) by mouth daily with breakfast.  . acetaminophen (TYLENOL) 650 MG CR tablet Take 650 mg by mouth every 8 (eight) hours as needed for pain.  Marland Kitchen  cyanocobalamin (,VITAMIN B-12,) 1000 MCG/ML injection INJECT 1ML INTO MUSCLE EVERY 30 DAYS (Patient not taking: Reported on 06/08/2019)  . metoprolol tartrate (LOPRESSOR) 25 MG tablet Take 1-2 tablets by mouth as needed for palpitations. (Patient not taking: Reported on 06/08/2019)  . PROAIR HFA 108 (90 Base) MCG/ACT inhaler USE 2 PUFFS EVERY 6 HOURS AS NEEDED. (Patient not taking: Reported on 06/08/2019)  . Simethicone (GAS-X EXTRA STRENGTH) 62.5 MG STRP Take by mouth. As needed   . SYMBICORT 160-4.5 MCG/ACT inhaler USE 2 PUFFS TWICE A DAY. (Patient not taking: Reported on 06/08/2019)   Facility-Administered Encounter Medications as of 06/08/2019  Medication  . 0.9 %  sodium chloride infusion    Activities of Daily Living In your present state of health, do you have any difficulty performing the following activities: 06/08/2019  Hearing? N  Vision? N  Difficulty concentrating or making decisions? N  Walking or climbing stairs? N  Dressing or bathing? N  Doing errands, shopping? N  Preparing Food and eating ? N  Using the Toilet? N  In the past six months, have you accidently leaked urine? N  Do you have problems with loss of bowel control? N  Managing your Medications? N  Managing your Finances? N  Housekeeping or managing your Housekeeping? N  Some recent data might be hidden    Patient Care Team: Carollee Herter, Alferd Apa, DO as PCP - General Gaynelle Arabian, MD as Consulting Physician (Orthopedic Surgery) Marica Otter, [place] as Consulting Physician (Optometry) Nyra Capes as Consulting Physician (Dentistry) Marica Otter, OD (Optometry)    Assessment:   This is a routine wellness examination for [place]. Physical assessment  deferred to PCP.  Exercise Activities and Dietary recommendations Current Exercise Habits: The patient does not participate in regular exercise at present, Exercise limited by: None identified   Diet (meal preparation, eat out, water intake, caffeinated beverages, dairy products, fruits and vegetables): well balanced, on average, 3 meals per day      Goals    . Increase physical activity       Fall Risk Fall Risk  06/08/2019 10/25/2017 08/06/2016 08/08/2015 12/27/2014  Falls in the past year? 0 Yes Yes No Yes  Number falls in past yr: - 2 or more 1 - 2 or more  Comment - - - - Tripped on a step  Injury with Fall? - No No - -  Risk for fall due to : - - Other (Comment) - -  Follow up - Education provided;Falls prevention discussed Falls prevention discussed - -    Depression Screen PHQ 2/9 Scores 06/08/2019 10/31/2018 10/25/2017 08/06/2016  PHQ - 2 Score 0 1 0 0  PHQ- 9 Score - 8 - -     Cognitive Function Ad8 score reviewed for issues:  Issues making decisions:no  Less interest in hobbies / activities:no  Repeats questions, stories (family complaining):no  Trouble using ordinary gadgets (microwave, computer, phone):no  Forgets the month or year: no  Mismanaging finances: no  Remembering appts:no  Daily problems with thinking and/or memory:no Ad8 score is=0   MMSE - Mini Mental State Exam 10/25/2017 08/06/2016 08/08/2015  Orientation to time 5 5 5   Orientation to Place 5 5 5   Registration 3 3 3   Attention/ Calculation 5 5 5   Recall 3 3 3   Language- name 2 objects 2 2 2   Language- repeat 1 1 1   Language- follow 3 step command 3 3 3   Language- read & follow direction 1 1 1   Write a  sentence 1 1 1   Copy design 1 1 1   Total score 30 30 30         Immunization History  Administered Date(s) Administered  . Influenza Split 09/08/2011, 08/25/2012  . Influenza Whole 08/26/2010  . Influenza, High Dose Seasonal PF 08/06/2016, 08/05/2017, 10/31/2018  . Influenza,inj,Quad  PF,6+ Mos 08/09/2014, 08/08/2015  . Pneumococcal Conjugate-13 12/27/2014  . Pneumococcal Polysaccharide-23 08/05/2010, 08/05/2017  . Tdap 07/12/2011  . Zoster 07/21/2011   Screening Tests Health Maintenance  Topic Date Due  . HEMOGLOBIN A1C  02/02/2018  . OPHTHALMOLOGY EXAM  07/21/2018  . FOOT EXAM  08/05/2018  . INFLUENZA VACCINE  06/30/2019  . TETANUS/TDAP  07/11/2021  . DEXA SCAN  Completed  . PNA vac Low Risk Adult  Completed      Plan:   See you next year.   I have ordered your mammogram and bone density scan. Please schedule.  Continue to eat heart healthy diet (full of fruits, vegetables, whole grains, lean protein, water--limit salt, fat, and sugar intake) and increase physical activity as tolerated.  Continue doing brain stimulating activities (puzzles, reading, adult coloring books, staying active) to keep memory sharp.     I have personally reviewed and noted the following in the patient's chart:   . Medical and social history . Use of alcohol, tobacco or illicit drugs  . Current medications and supplements . Functional ability and status . Nutritional status . Physical activity . Advanced directives . List of other physicians . Hospitalizations, surgeries, and ER visits in previous 12 months . Vitals . Screenings to include cognitive, depression, and falls . Referrals and appointments  In addition, I have reviewed and discussed with patient certain preventive protocols, quality metrics, and best practice recommendations. A written personalized care plan for preventive services as well as general preventive health recommendations were provided to patient.     Shela Nevin, [place]  06/08/2019

## 2019-06-08 ENCOUNTER — Other Ambulatory Visit: Payer: Self-pay

## 2019-06-08 ENCOUNTER — Encounter: Payer: Self-pay | Admitting: *Deleted

## 2019-06-08 ENCOUNTER — Ambulatory Visit (INDEPENDENT_AMBULATORY_CARE_PROVIDER_SITE_OTHER): Payer: Medicare Other | Admitting: *Deleted

## 2019-06-08 VITALS — BP 124/73 | HR 79

## 2019-06-08 DIAGNOSIS — Z0000 Encounter for general adult medical examination without abnormal findings: Secondary | ICD-10-CM

## 2019-06-08 DIAGNOSIS — Z1231 Encounter for screening mammogram for malignant neoplasm of breast: Secondary | ICD-10-CM

## 2019-06-08 DIAGNOSIS — Z780 Asymptomatic menopausal state: Secondary | ICD-10-CM

## 2019-06-08 NOTE — Patient Instructions (Signed)
See you next year.   I have ordered your mammogram and bone density scan. Please schedule.  Continue to eat heart healthy diet (full of fruits, vegetables, whole grains, lean protein, water--limit salt, fat, and sugar intake) and increase physical activity as tolerated.  Continue doing brain stimulating activities (puzzles, reading, adult coloring books, staying active) to keep memory sharp.    Stacy Ayers , Thank you for taking time to come for your Medicare Wellness Visit. I appreciate your ongoing commitment to your health goals. Please review the following plan we discussed and let me know if I can assist you in the future.   These are the goals we discussed: Goals    . Increase physical activity       This is a list of the screening recommended for you and due dates:  Health Maintenance  Topic Date Due  . Hemoglobin A1C  02/02/2018  . Eye exam for diabetics  07/21/2018  . Complete foot exam   08/05/2018  . Flu Shot  06/30/2019  . Tetanus Vaccine  07/11/2021  . DEXA scan (bone density measurement)  Completed  . Pneumonia vaccines  Completed    Health Maintenance After Age 63 After age 31, you are at a higher risk for certain long-term diseases and infections as well as injuries from falls. Falls are a major cause of broken bones and head injuries in people who are older than age 8. Getting regular preventive care can help to keep you healthy and well. Preventive care includes getting regular testing and making lifestyle changes as recommended by your health care provider. Talk with your health care provider about:  Which screenings and tests you should have. A screening is a test that checks for a disease when you have no symptoms.  A diet and exercise plan that is right for you. What should I know about screenings and tests to prevent falls? Screening and testing are the best ways to find a health problem early. Early diagnosis and treatment give you the best chance of  managing medical conditions that are common after age 9. Certain conditions and lifestyle choices may make you more likely to have a fall. Your health care provider may recommend:  Regular vision checks. Poor vision and conditions such as cataracts can make you more likely to have a fall. If you wear glasses, make sure to get your prescription updated if your vision changes.  Medicine review. Work with your health care provider to regularly review all of the medicines you are taking, including over-the-counter medicines. Ask your health care provider about any side effects that may make you more likely to have a fall. Tell your health care provider if any medicines that you take make you feel dizzy or sleepy.  Osteoporosis screening. Osteoporosis is a condition that causes the bones to get weaker. This can make the bones weak and cause them to break more easily.  Blood pressure screening. Blood pressure changes and medicines to control blood pressure can make you feel dizzy.  Strength and balance checks. Your health care provider may recommend certain tests to check your strength and balance while standing, walking, or changing positions.  Foot health exam. Foot pain and numbness, as well as not wearing proper footwear, can make you more likely to have a fall.  Depression screening. You may be more likely to have a fall if you have a fear of falling, feel emotionally low, or feel unable to do activities that you used to do.  Alcohol use screening. Using too much alcohol can affect your balance and may make you more likely to have a fall. What actions can I take to lower my risk of falls? General instructions  Talk with your health care provider about your risks for falling. Tell your health care provider if: ? You fall. Be sure to tell your health care provider about all falls, even ones that seem minor. ? You feel dizzy, sleepy, or off-balance.  Take over-the-counter and prescription  medicines only as told by your health care provider. These include any supplements.  Eat a healthy diet and maintain a healthy weight. A healthy diet includes low-fat dairy products, low-fat (lean) meats, and fiber from whole grains, beans, and lots of fruits and vegetables. Home safety  Remove any tripping hazards, such as rugs, cords, and clutter.  Install safety equipment such as grab bars in bathrooms and safety rails on stairs.  Keep rooms and walkways well-lit. Activity   Follow a regular exercise program to stay fit. This will help you maintain your balance. Ask your health care provider what types of exercise are appropriate for you.  If you need a cane or walker, use it as recommended by your health care provider.  Wear supportive shoes that have nonskid soles. Lifestyle  Do not drink alcohol if your health care provider tells you not to drink.  If you drink alcohol, limit how much you have: ? 0-1 drink a day for women. ? 0-2 drinks a day for men.  Be aware of how much alcohol is in your drink. In the [place] one drink equals one typical bottle of beer (12 oz), one-half glass of wine (5 oz), or one shot of hard liquor (1 oz).  Do not use any products that contain nicotine or tobacco, such as cigarettes and e-cigarettes. If you need help quitting, ask your health care provider. Summary  Having a healthy lifestyle and getting preventive care can help to protect your health and wellness after age 43.  Screening and testing are the best way to find a health problem early and help you avoid having a fall. Early diagnosis and treatment give you the best chance for managing medical conditions that are more common for people who are older than age 76.  Falls are a major cause of broken bones and head injuries in people who are older than age 79. Take precautions to prevent a fall at home.  Work with your health care provider to learn what changes you can make to improve your  health and wellness and to prevent falls. This information is not intended to replace advice given to you by your health care provider. Make sure you discuss any questions you have with your health care provider. Document Released: 09/28/2017 Document Revised: 03/08/2019 Document Reviewed: 09/28/2017 Elsevier Patient Education  2020 [employer].

## 2019-06-10 ENCOUNTER — Other Ambulatory Visit: Payer: Self-pay | Admitting: Family Medicine

## 2019-06-10 DIAGNOSIS — K219 Gastro-esophageal reflux disease without esophagitis: Secondary | ICD-10-CM

## 2019-06-10 DIAGNOSIS — I10 Essential (primary) hypertension: Secondary | ICD-10-CM

## 2019-06-10 DIAGNOSIS — F418 Other specified anxiety disorders: Secondary | ICD-10-CM

## 2019-06-11 ENCOUNTER — Other Ambulatory Visit: Payer: Self-pay

## 2019-06-11 ENCOUNTER — Ambulatory Visit (INDEPENDENT_AMBULATORY_CARE_PROVIDER_SITE_OTHER): Payer: Medicare Other | Admitting: Medical

## 2019-06-11 ENCOUNTER — Encounter: Payer: Self-pay | Admitting: Medical

## 2019-06-11 ENCOUNTER — Ambulatory Visit: Payer: Medicare Other | Admitting: Medical

## 2019-06-11 VITALS — BP 133/70 | HR 78 | Temp 98.2°F | Ht 68.0 in | Wt 192.6 lb

## 2019-06-11 DIAGNOSIS — B379 Candidiasis, unspecified: Secondary | ICD-10-CM | POA: Diagnosis not present

## 2019-06-11 MED ORDER — FLUCONAZOLE 150 MG PO TABS: 150.0000 mg | ORAL_TABLET | Freq: Once | ORAL | 0 refills | Status: AC

## 2019-06-11 NOTE — Progress Notes (Signed)
   Subjective:    Patient ID: Stacy Ayers, female    DOB: 09/20/1941, 78 y.o.   MRN: [id number]  HPI  Virtual Visit via Video Note  I connected with Stacy Ayers on 06/11/19 at  1:00 PM EDT by a video enabled telemedicine application and verified that I am speaking with the correct person using two identifiers.  Location: Patient: home Provider: office.   I discussed the limitations of evaluation and management by telemedicine and the availability of in person appointments. The patient expressed understanding and agreed to proceed.  History of Present Illness: Pt in for some vaginal irritation around labia. The area is itching and it burns some. No intravaginal pain. No dc. Pt does wears some pad due to some urinary incontinence and is moist some. Has been to PT and exericse helps some.  Pt in past has used diflucan in past for yeast infection per her report he thinks this was med.   Observations/Objective:  General-no acute distress, pleasant, oriented. Lungs- on inspection lungs appear unlabored. Neck- no tracheal deviation or jvd on inspection. Neuro- gross motor function appears intact.  Assessment and Plan: You do have a history and description of probable yeast infection.  Virtual visit done today and I think it is reasonable to go ahead and prescribe the Diflucan 1 tablet for 1 day.  When asked her to update [place] via [place] on Thursday morning as to how you are feeling.  Hopefully symptoms will have resolved completely.  If not then would probably have you come in for urine ancillary studies on Thursday afternoon or March 15, 2023 before the weekend.  Then we would expand work-up to evaluate other potential causes.  Follow-up date to be determined depending on clinical response to Diflucan.  Mackie Pai, PA-C  Follow Up Instructions:    I discussed the assessment and treatment plan with the patient. The patient was provided an opportunity to ask questions and all were  answered. The patient agreed with the plan and demonstrated an understanding of the instructions.   The patient was advised to call back or seek an in-person evaluation if the symptoms worsen or if the condition fails to improve as anticipated.  I provided 10 minutes of non-face-to-face time during this encounter.   Mackie Pai, PA-C   Review of Systems  Constitutional: Negative for chills, fatigue and fever.  Respiratory: Negative for cough, chest tightness, shortness of breath and wheezing.   Cardiovascular: Negative for chest pain and palpitations.  Gastrointestinal: Negative for abdominal pain, blood in stool, diarrhea, nausea and vomiting.  Genitourinary: Negative for difficulty urinating, dysuria, flank pain, frequency, pelvic pain, urgency, vaginal bleeding, vaginal discharge and vaginal pain.       Labia itching and irritation.  Musculoskeletal: Negative for back pain and gait problem.  Skin: Negative for rash.  Neurological: Negative for dizziness, tremors, weakness, numbness and headaches.  Hematological: Negative for adenopathy. Does not bruise/bleed easily.  Psychiatric/Behavioral: Negative for behavioral problems and confusion.       Objective:   Physical Exam        Assessment & Plan:

## 2019-06-11 NOTE — Patient Instructions (Addendum)
You do have a history and description of probable yeast infection.  Virtual visit done today and I think it is reasonable to go ahead and prescribe the Diflucan 1 tablet for 1 day.  When asked her to update [place] via [place] on Thursday morning as to how you are feeling.  Hopefully symptoms will have resolved completely.  If not then would probably have you come in for urine ancillary studies on Thursday afternoon or 2023-03-07 before the weekend.  Then we would expand work-up to evaluate other potential causes.  Follow-up date to be determined depending on clinical response to Diflucan.

## 2019-06-19 NOTE — Progress Notes (Signed)
Noted. Agree with above.  [place], DO 06/19/19 2:47 PM

## 2019-06-22 ENCOUNTER — Encounter (HOSPITAL_BASED_OUTPATIENT_CLINIC_OR_DEPARTMENT_OTHER): Payer: Self-pay

## 2019-06-22 ENCOUNTER — Other Ambulatory Visit: Payer: Self-pay

## 2019-06-22 ENCOUNTER — Ambulatory Visit (HOSPITAL_BASED_OUTPATIENT_CLINIC_OR_DEPARTMENT_OTHER)
Admission: RE | Admit: 2019-06-22 | Discharge: 2019-06-22 | Disposition: A | Discharge status: Home / Self Care | Payer: Medicare Other | Source: Ambulatory Visit | Attending: Family Medicine | Admitting: Family Medicine

## 2019-06-22 DIAGNOSIS — Z1231 Encounter for screening mammogram for malignant neoplasm of breast: Secondary | ICD-10-CM

## 2019-06-22 DIAGNOSIS — Z780 Asymptomatic menopausal state: Secondary | ICD-10-CM

## 2019-07-10 ENCOUNTER — Other Ambulatory Visit: Payer: Self-pay | Admitting: Family Medicine

## 2019-07-10 ENCOUNTER — Other Ambulatory Visit: Payer: Self-pay

## 2019-07-10 DIAGNOSIS — E039 Hypothyroidism, unspecified: Secondary | ICD-10-CM

## 2019-07-10 MED ORDER — LEVOTHYROXINE SODIUM 125 MCG PO TABS: 125.0000 ug | ORAL_TABLET | Freq: Every day | ORAL | 0 refills | Status: DC

## 2019-07-10 NOTE — Telephone Encounter (Signed)
We need to recheck her thyroid --- it was slightly abnormal last time  Does she have enough to last until we can check labs ?

## 2019-07-11 NOTE — Telephone Encounter (Signed)
Called Ms Henrietta Dine left a vm for her to call me back

## 2019-08-09 ENCOUNTER — Other Ambulatory Visit: Payer: Self-pay | Admitting: Family Medicine

## 2019-08-09 DIAGNOSIS — E039 Hypothyroidism, unspecified: Secondary | ICD-10-CM

## 2019-08-09 DIAGNOSIS — I10 Essential (primary) hypertension: Secondary | ICD-10-CM

## 2019-08-09 DIAGNOSIS — F418 Other specified anxiety disorders: Secondary | ICD-10-CM

## 2019-08-13 ENCOUNTER — Other Ambulatory Visit: Payer: Self-pay | Admitting: Family Medicine

## 2019-08-13 DIAGNOSIS — I10 Essential (primary) hypertension: Secondary | ICD-10-CM

## 2019-08-13 DIAGNOSIS — F418 Other specified anxiety disorders: Secondary | ICD-10-CM

## 2019-08-13 DIAGNOSIS — E039 Hypothyroidism, unspecified: Secondary | ICD-10-CM

## 2019-09-09 ENCOUNTER — Other Ambulatory Visit: Payer: Self-pay | Admitting: Family Medicine

## 2019-09-09 DIAGNOSIS — F418 Other specified anxiety disorders: Secondary | ICD-10-CM

## 2019-10-11 ENCOUNTER — Other Ambulatory Visit: Payer: Self-pay | Admitting: Family Medicine

## 2019-10-11 DIAGNOSIS — K219 Gastro-esophageal reflux disease without esophagitis: Secondary | ICD-10-CM

## 2019-12-26 ENCOUNTER — Other Ambulatory Visit: Payer: Self-pay | Admitting: Family Medicine

## 2019-12-26 DIAGNOSIS — E039 Hypothyroidism, unspecified: Secondary | ICD-10-CM

## 2019-12-26 DIAGNOSIS — I10 Essential (primary) hypertension: Secondary | ICD-10-CM

## 2019-12-26 DIAGNOSIS — K219 Gastro-esophageal reflux disease without esophagitis: Secondary | ICD-10-CM

## 2019-12-26 DIAGNOSIS — F418 Other specified anxiety disorders: Secondary | ICD-10-CM

## 2020-01-29 ENCOUNTER — Other Ambulatory Visit: Payer: Self-pay | Admitting: Family Medicine

## 2020-01-29 DIAGNOSIS — I10 Essential (primary) hypertension: Secondary | ICD-10-CM

## 2020-01-31 ENCOUNTER — Encounter: Payer: Self-pay | Admitting: Family Medicine

## 2020-01-31 ENCOUNTER — Other Ambulatory Visit (HOSPITAL_COMMUNITY)
Admission: RE | Admit: 2020-01-31 | Discharge: 2020-01-31 | Disposition: A | Discharge status: Home / Self Care | Payer: Medicare PPO | Source: Ambulatory Visit | Attending: Family Medicine | Admitting: Family Medicine

## 2020-01-31 ENCOUNTER — Other Ambulatory Visit: Payer: Self-pay

## 2020-01-31 ENCOUNTER — Ambulatory Visit: Payer: Medicare PPO | Admitting: Family Medicine

## 2020-01-31 VITALS — BP 124/70 | HR 70 | Temp 97.0°F | Resp 18 | Ht 68.0 in | Wt 175.8 lb

## 2020-01-31 DIAGNOSIS — E785 Hyperlipidemia, unspecified: Secondary | ICD-10-CM

## 2020-01-31 DIAGNOSIS — N761 Subacute and chronic vaginitis: Secondary | ICD-10-CM | POA: Diagnosis not present

## 2020-01-31 DIAGNOSIS — Z8639 Personal history of other endocrine, nutritional and metabolic disease: Secondary | ICD-10-CM

## 2020-01-31 DIAGNOSIS — E039 Hypothyroidism, unspecified: Secondary | ICD-10-CM | POA: Diagnosis not present

## 2020-01-31 DIAGNOSIS — R739 Hyperglycemia, unspecified: Secondary | ICD-10-CM

## 2020-01-31 DIAGNOSIS — I10 Essential (primary) hypertension: Secondary | ICD-10-CM | POA: Diagnosis not present

## 2020-01-31 DIAGNOSIS — K219 Gastro-esophageal reflux disease without esophagitis: Secondary | ICD-10-CM | POA: Diagnosis not present

## 2020-01-31 DIAGNOSIS — F418 Other specified anxiety disorders: Secondary | ICD-10-CM | POA: Diagnosis not present

## 2020-01-31 LAB — POC URINALSYSI DIPSTICK (AUTOMATED)
Bilirubin, UA: NEGATIVE
Blood, UA: NEGATIVE
Glucose, UA: NEGATIVE
Nitrite, UA: NEGATIVE
Protein, UA: NEGATIVE
Spec Grav, UA: 1.025 (ref 1.010–1.025)
Urobilinogen, UA: 0.2 E.U./dL
pH, UA: 6 (ref 5.0–8.0)

## 2020-01-31 MED ORDER — LEVOTHYROXINE SODIUM 125 MCG PO TABS: ORAL_TABLET | ORAL | 0 refills | Status: DC

## 2020-01-31 MED ORDER — VENLAFAXINE HCL ER 75 MG PO CP24: 75.0000 mg | ORAL_CAPSULE | Freq: Every day | ORAL | 3 refills | Status: DC

## 2020-01-31 MED ORDER — PANTOPRAZOLE SODIUM 40 MG PO TBEC: 40.0000 mg | DELAYED_RELEASE_TABLET | Freq: Every day | ORAL | 0 refills | Status: DC

## 2020-01-31 MED ORDER — LOSARTAN POTASSIUM 50 MG PO TABS: 50.0000 mg | ORAL_TABLET | Freq: Every day | ORAL | 0 refills | Status: DC

## 2020-01-31 MED ORDER — VENLAFAXINE HCL ER 37.5 MG PO CP24: ORAL_CAPSULE | ORAL | 0 refills | Status: DC

## 2020-01-31 NOTE — Assessment & Plan Note (Signed)
Encouraged heart healthy diet, increase exercise, avoid trans fats, consider a krill oil cap daily 

## 2020-01-31 NOTE — Assessment & Plan Note (Signed)
Check labs 

## 2020-01-31 NOTE — Assessment & Plan Note (Signed)
Check labs con't meds 

## 2020-01-31 NOTE — Patient Instructions (Signed)

## 2020-01-31 NOTE — Progress Notes (Signed)
Patient ID: Stacy Ayers, female    DOB: 1941-08-09  Age: 79 y.o. MRN: [id number]    Subjective:  Subjective  HPI CALEN NALLY presents for f/u bp , chol and thyroid   No complaints She also needs med refills   Review of Systems  Constitutional: Negative for appetite change, diaphoresis, fatigue and unexpected weight change.  Eyes: Negative for pain, redness and visual disturbance.  Respiratory: Negative for cough, chest tightness, shortness of breath and wheezing.   Cardiovascular: Negative for chest pain, palpitations and leg swelling.  Endocrine: Negative for cold intolerance, heat intolerance, polydipsia, polyphagia and polyuria.  Genitourinary: Negative for difficulty urinating, dysuria and frequency.  Neurological: Negative for dizziness, light-headedness, numbness and headaches.    History Past Medical History:  Diagnosis Date  . Abnormal liver function tests   . Allergy   . Anxiety   . Arthritis    knees, lower back   . Asthma   . Cataract    left eye growing cataract   . Depression   . Diabetes mellitus    had gastric bypass DM resolved-   . Diverticulosis   . GERD (gastroesophageal reflux disease)   . Heart murmur   . History of colon polyps    adenomatous  . Hyperlipidemia    past hx- resloved after gastric bypass   . Hypertension   . Hypothyroidism   . IBS (irritable bowel syndrome)   . Iron deficiency anemia   . Osteoporosis     She has a past surgical history that includes Abdominal hysterectomy; Appendectomy; Gastric bypass; Coronary angioplasty; Cholecystectomy; Tonsillectomy and adenoidectomy; Bladder repair; Abdominal hysterectomy; Colonoscopy; Polypectomy; Cataract extraction (Left); and Breast biopsy (Right).   Her family history includes Angina in her maternal grandmother; Cancer in her paternal grandmother; Diabetes in her sister; Heart disease in her maternal grandmother; Obesity in her father; Stroke in her mother; Suicidality in her maternal  grandfather.She reports that she quit smoking about 57 years ago. Her smoking use included cigarettes. She has a 0.60 pack-year smoking history. She has never used smokeless tobacco. She reports previous alcohol use. She reports that she does not use drugs.  Current Outpatient Medications on File Prior to Visit  Medication Sig Dispense Refill  . acetaminophen (TYLENOL) 650 MG CR tablet Take 650 mg by mouth every 8 (eight) hours as needed for pain.    . cholecalciferol (VITAMIN D) 1000 UNITS tablet Take 1,000 Units by mouth daily.      . cyanocobalamin (,VITAMIN B-12,) 1000 MCG/ML injection INJECT 1ML INTO MUSCLE EVERY 30 DAYS 3 mL 1  . denosumab (PROLIA) 60 MG/ML SOSY injection Inject 60 mg into the skin every 6 (six) months.    . fluticasone (FLONASE) 50 MCG/ACT nasal spray USE 2 SPRAYS IN EACH NOSTRIL ONCE DAILY 16 g 5  . loperamide (IMODIUM) 2 MG capsule Take 2 mg by mouth as needed. 2 in the morning and 2 in the afternoon    . loratadine (CLARITIN) 10 MG tablet TAKE 1 TABLET ONCE DAILY. 90 tablet 1  . metoprolol tartrate (LOPRESSOR) 25 MG tablet Take 1-2 tablets by mouth as needed for palpitations. 30 tablet 3  . PROAIR HFA 108 (90 Base) MCG/ACT inhaler USE 2 PUFFS EVERY 6 HOURS AS NEEDED. 8.5 g 0  . Simethicone (GAS-X EXTRA STRENGTH) 62.5 MG STRP Take by mouth. As needed     . SYMBICORT 160-4.5 MCG/ACT inhaler USE 2 PUFFS TWICE A DAY. 10.2 g 0  . zinc gluconate 50 MG  tablet Take 50 mg by mouth daily.     Current Facility-Administered Medications on File Prior to Visit  Medication Dose Route Frequency Provider Last Rate Last Admin  . 0.9 %  sodium chloride infusion  500 mL Intravenous Once Irene Shipper, MD         Objective:  Objective  Physical Exam Vitals and nursing note reviewed.  Constitutional:      Appearance: She is well-developed.  HENT:     Head: Normocephalic and atraumatic.  Eyes:     Conjunctiva/sclera: Conjunctivae normal.  Neck:     Thyroid: No thyromegaly.      Vascular: No carotid bruit or JVD.  Cardiovascular:     Rate and Rhythm: Normal rate and regular rhythm.     Heart sounds: Normal heart sounds. No murmur.  Pulmonary:     Effort: Pulmonary effort is normal. No respiratory distress.     Breath sounds: Normal breath sounds. No wheezing or rales.  Chest:     Chest wall: No tenderness.  Musculoskeletal:     Cervical back: Normal range of motion and neck supple.  Neurological:     Mental Status: She is alert and oriented to person, place, and time.    BP 124/70 (BP Location: Left Arm, Patient Position: Sitting, Cuff Size: Normal)   Pulse 70   Temp (!) 97 F (36.1 C) (Temporal)   Resp 18   Ht 5\' 8"  (1.727 m)   Wt 175 lb 12.8 oz (79.7 kg)   SpO2 98%   BMI 26.73 kg/m  Wt Readings from Last 3 Encounters:  01/31/20 175 lb 12.8 oz (79.7 kg)  06/11/19 192 lb 9.6 oz (87.4 kg)  11/13/18 185 lb 6.4 oz (84.1 kg)     Lab Results  Component Value Date   WBC 4.6 10/31/2018   HGB 11.9 (L) 10/31/2018   HCT 35.2 (L) 10/31/2018   PLT 192.0 10/31/2018   GLUCOSE 173 (H) 05/28/2019   CHOL 212 (H) 05/28/2019   TRIG 347.0 (H) 05/28/2019   HDL 41.20 05/28/2019   LDLDIRECT 147.0 05/28/2019   LDLCALC 91 08/06/2016   ALT 23 05/28/2019   AST 23 05/28/2019   NA 138 05/28/2019   K 4.8 05/28/2019   CL 104 05/28/2019   CREATININE 0.76 05/28/2019   BUN 13 05/28/2019   CO2 27 05/28/2019   TSH 6.95 (H) 05/28/2019   INR Positive 04/01/2017   HGBA1C 5.5 08/05/2017    DG Bone Density  Result Date: 06/22/2019 EXAM: DUAL X-RAY ABSORPTIOMETRY (DXA) FOR BONE MINERAL DENSITY IMPRESSION: Patient: Stacy Ayers Referring Physician: [name] Birth Date: December 29, 1940 [age] Patient ID: [id number] Height: 66.5 in. Weight: 190.6 lbs. Measured: 06/22/2019 10:16:00 AM (16 SP 2) Gender: Female Ethnicity: White Analyzed: 06/22/2019 10:26:37 AM (16 SP 2) AP Spine Bone Density Densitometry: [place] (Combined NHANES/Lunar) Region BMD     YA       AM (g/cm2) T-score Z-score L1         1.146 0.1  1.9 L2         1.056 -1.2 0.6 L3         1.314 0.9  2.7 L4         1.375 1.5  3.2 L2-L4 (L3) 1.237 0.3  2.1 Patient: Stacy Ayers Referring Physician: [name] Birth Date: Mar 05, 1941 [age] Patient ID: [id number] Height: 66.5 in. Weight: 190.6  lbs. Measured: 06/22/2019 10:16:00 AM (16 SP 2) Gender: Female Ethnicity: White Analyzed: 06/22/2019 10:26:37 AM (16 SP 2) ANCILLARY RESULTS: [facility] Region     BMD     YA  YA      AM  AM      BMC   Area  Width Height (g/cm2) (%) T-score (%) Z-score (g)   (cm2) (cm)  (cm) L1         1.146   101 0.1     125 1.9     16.26 14.18 4.4   3.23 L2         1.056   88  -1.2    107 0.6     15.65 14.83 4.2   3.57 L3         1.314   109 0.9     133 2.7     18.10 13.78 4.5   3.09 L4         1.375   115 1.5     139 3.2     26.83 19.52 5.4   3.64 L2-L4 (L3) 1.237   103 0.3     125 2.1     42.48 34.35 4.8   7.21 Patient: Stacy Ayers Referring Physician: [name] Birth Date: 1941-09-26 [age] Patient ID: [id number] Height: 66.5 in. Weight: 190.6 lbs. Measured: 06/22/2019 10:16:00 AM (16 SP 2) Gender: Female Ethnicity: White Analyzed: 06/22/2019 10:26:37 AM (16 SP 2) DualFemur Bone Density Densitometry: [place] (Combined NHANES/Lunar) Region BMD (g/cm2) YA T-score AM Z-score Neck Left 0.711 -2.3 -0.4 Neck Right 0.711 -2.4 -0.4 Neck Mean 0.711 -2.4 -0.4 Neck Diff. 0.000 0.0 0.0 Total Left 0.796 -1.7 0.1 Total Right 0.846 -1.3 0.5 Total Mean 0.821 -1.5 0.3 Total Diff. 0.050 0.4 0.4 Densitometry: [place] (Combined NHANES/Lunar) Region BMD     YA      AM (g/cm2) T-score Z-score Neck Left   0.711 -2.3 -0.4 Neck Right  0.711 -2.4 -0.4 Neck Mean   0.711 -2.4 -0.4 Neck Diff.  0.000 0.0  0.0 Total Left  0.796 -1.7 0.1 Total Right 0.846 -1.3 0.5 Total Mean  0.821 -1.5 0.3 Total Diff. 0.050 0.4  0.4 Electronically Signed   By: Fidela Salisbury M.D.   On: 06/22/2019 10:25   MM 3D SCREEN BREAST  BILATERAL  Result Date: 06/22/2019 CLINICAL DATA:  Screening. EXAM: DIGITAL SCREENING BILATERAL MAMMOGRAM WITH TOMO AND CAD COMPARISON:  Previous exam(s). ACR Breast Density Category b: There are scattered areas of fibroglandular density. FINDINGS: There are no findings suspicious for malignancy. Images were processed with CAD. IMPRESSION: No mammographic evidence of malignancy. A result letter of this screening mammogram will be mailed directly to the patient. RECOMMENDATION: Screening mammogram in one year. (Code:SM-B-01Y) BI-RADS CATEGORY  1: Negative. Electronically Signed   By: Ammie Ferrier M.D.   On: 06/22/2019 16:26     Assessment & Plan:  Plan  I have discontinued Laelynn A. Annunziato's venlafaxine XR and venlafaxine XR. I have changed her Synthroid to levothyroxine. I have also changed her pantoprazole. Additionally, I am having her start on venlafaxine XR. Lastly, I am having her maintain her Simethicone, loperamide, cholecalciferol, acetaminophen, Symbicort, ProAir HFA, metoprolol tartrate, cyanocobalamin, denosumab, loratadine, fluticasone, losartan, and zinc gluconate. We will continue to administer sodium chloride.  Meds ordered this encounter  Medications  . levothyroxine (SYNTHROID) 125 MCG tablet    Sig: TAKE 1 TABLET BEFORE BREAKFAST.    Dispense:  30 tablet    Refill:  0    Pt needs OV  . pantoprazole (PROTONIX) 40 MG tablet    Sig: Take 1 tablet (40 mg total) by mouth daily.    Dispense:  30 tablet    Refill:  0    Pt needs OV  . DISCONTD: venlafaxine XR (EFFEXOR-XR) 37.5 MG 24 hr capsule    Sig: TAKE 3 CAPSULES ONCE DAILY.    Dispense:  90 capsule    Refill:  0    Pt needs OV  . losartan (COZAAR) 50 MG tablet    Sig: Take 1 tablet (50 mg total) by mouth daily. Pt needs OV for more refills    Dispense:  30 tablet    Refill:  0  . venlafaxine XR (EFFEXOR XR) 75 MG 24 hr capsule    Sig: Take 1 capsule (75 mg total) by mouth daily with breakfast.    Dispense:  90  capsule    Refill:  3    Problem List Items Addressed This Visit      Unprioritized   Depression with anxiety    Pt is only taking 75 mg effexor       Relevant Medications   venlafaxine XR (EFFEXOR XR) 75 MG 24 hr capsule   Essential hypertension    Well controlled, no changes to meds. Encouraged heart healthy diet such as the DASH diet and exercise as tolerated.       Relevant Medications   losartan (COZAAR) 50 MG tablet   Other Relevant Orders   Lipid panel   Comprehensive metabolic panel   History of diet-controlled diabetes    Check labs       Hyperlipidemia LDL goal <100    Encouraged heart healthy diet, increase exercise, avoid trans fats, consider a krill oil cap daily      Relevant Medications   losartan (COZAAR) 50 MG tablet   Hypothyroidism    Check labs con't meds       Relevant Medications   levothyroxine (SYNTHROID) 125 MCG tablet   Other Relevant Orders   TSH    Other Visit Diagnoses    Chronic vaginitis    -  Primary   Relevant Orders   POCT Urinalysis Dipstick (Automated) (Completed)   Urine cytology ancillary only([place])   Urine Culture   Gastroesophageal reflux disease       Relevant Medications   pantoprazole (PROTONIX) 40 MG tablet   Hyperglycemia       Relevant Orders   Hemoglobin A1c      Follow-up: Return in about 6 months (around 08/02/2020), or if symptoms worsen or fail to improve, for annual exam, fasting.  [name], DO

## 2020-01-31 NOTE — Assessment & Plan Note (Signed)
Pt is only taking 75 mg effexor

## 2020-01-31 NOTE — Assessment & Plan Note (Signed)
Well controlled, no changes to meds. Encouraged heart healthy diet such as the DASH diet and exercise as tolerated.  °

## 2020-02-01 LAB — LIPID PANEL
Cholesterol: 189 mg/dL (ref 0–200)
HDL: 60.4 mg/dL (ref >39.00)
LDL Cholesterol: 97 mg/dL (ref 0–99)
NonHDL: 128.5
Total CHOL/HDL Ratio: 3
Triglycerides: 157 mg/dL — ABNORMAL HIGH (ref 0.0–149.0)
VLDL: 31.4 mg/dL (ref 0.0–40.0)

## 2020-02-01 LAB — URINE CULTURE
MICRO NUMBER:: 10215060
SPECIMEN QUALITY:: ADEQUATE

## 2020-02-01 LAB — COMPREHENSIVE METABOLIC PANEL
ALT: 21 U/L (ref 0–35)
AST: 24 U/L (ref 0–37)
Albumin: 4.3 g/dL (ref 3.5–5.2)
Alkaline Phosphatase: 71 U/L (ref 39–117)
BUN: 13 mg/dL (ref 6–23)
CO2: 28 mEq/L (ref 19–32)
Calcium: 10 mg/dL (ref 8.4–10.5)
Chloride: 101 mEq/L (ref 96–112)
Creatinine, Ser: 0.75 mg/dL (ref 0.40–1.20)
GFR: 74.99 mL/min (ref >60.00)
Glucose, Bld: 93 mg/dL (ref 70–99)
Potassium: 4.6 mEq/L (ref 3.5–5.1)
Sodium: 138 mEq/L (ref 135–145)
Total Bilirubin: 0.5 mg/dL (ref 0.2–1.2)
Total Protein: 6.9 g/dL (ref 6.0–8.3)

## 2020-02-01 LAB — HEMOGLOBIN A1C: Hgb A1c MFr Bld: 5.5 % (ref 4.6–6.5)

## 2020-02-01 LAB — TSH: TSH: 5.11 u[IU]/mL — ABNORMAL HIGH (ref 0.35–4.50)

## 2020-02-04 ENCOUNTER — Other Ambulatory Visit: Payer: Self-pay | Admitting: Family Medicine

## 2020-02-04 DIAGNOSIS — E039 Hypothyroidism, unspecified: Secondary | ICD-10-CM

## 2020-02-05 ENCOUNTER — Other Ambulatory Visit: Payer: Self-pay

## 2020-02-05 DIAGNOSIS — E039 Hypothyroidism, unspecified: Secondary | ICD-10-CM

## 2020-02-05 MED ORDER — LEVOTHYROXINE SODIUM 137 MCG PO CAPS: 1.0000 | ORAL_CAPSULE | Freq: Every day | ORAL | 2 refills | Status: DC

## 2020-02-07 LAB — URINE CYTOLOGY ANCILLARY ONLY
Bacterial Vaginitis-Urine: NEGATIVE
Candida Urine: NEGATIVE
Chlamydia: NEGATIVE
Comment: NEGATIVE
Comment: NEGATIVE
Comment: NORMAL
Neisseria Gonorrhea: NEGATIVE
Trichomonas: NEGATIVE

## 2020-02-14 DIAGNOSIS — M1712 Unilateral primary osteoarthritis, left knee: Secondary | ICD-10-CM | POA: Diagnosis not present

## 2020-03-03 ENCOUNTER — Other Ambulatory Visit: Payer: Self-pay | Admitting: Family Medicine

## 2020-03-03 DIAGNOSIS — I10 Essential (primary) hypertension: Secondary | ICD-10-CM

## 2020-03-03 DIAGNOSIS — K219 Gastro-esophageal reflux disease without esophagitis: Secondary | ICD-10-CM

## 2020-03-03 DIAGNOSIS — J302 Other seasonal allergic rhinitis: Secondary | ICD-10-CM

## 2020-03-05 DIAGNOSIS — M7711 Lateral epicondylitis, right elbow: Secondary | ICD-10-CM | POA: Diagnosis not present

## 2020-03-05 DIAGNOSIS — M7541 Impingement syndrome of right shoulder: Secondary | ICD-10-CM | POA: Diagnosis not present

## 2020-03-05 DIAGNOSIS — M25511 Pain in right shoulder: Secondary | ICD-10-CM | POA: Diagnosis not present

## 2020-03-14 DIAGNOSIS — M25511 Pain in right shoulder: Secondary | ICD-10-CM | POA: Diagnosis not present

## 2020-03-14 DIAGNOSIS — M25521 Pain in right elbow: Secondary | ICD-10-CM | POA: Diagnosis not present

## 2020-03-24 DIAGNOSIS — M25511 Pain in right shoulder: Secondary | ICD-10-CM | POA: Diagnosis not present

## 2020-03-24 DIAGNOSIS — M25521 Pain in right elbow: Secondary | ICD-10-CM | POA: Diagnosis not present

## 2020-04-01 DIAGNOSIS — M25521 Pain in right elbow: Secondary | ICD-10-CM | POA: Diagnosis not present

## 2020-04-01 DIAGNOSIS — M25511 Pain in right shoulder: Secondary | ICD-10-CM | POA: Diagnosis not present

## 2020-04-03 DIAGNOSIS — M25511 Pain in right shoulder: Secondary | ICD-10-CM | POA: Diagnosis not present

## 2020-04-03 DIAGNOSIS — M25521 Pain in right elbow: Secondary | ICD-10-CM | POA: Diagnosis not present

## 2020-04-08 ENCOUNTER — Other Ambulatory Visit: Payer: Medicare PPO

## 2020-04-08 DIAGNOSIS — M25511 Pain in right shoulder: Secondary | ICD-10-CM | POA: Diagnosis not present

## 2020-04-08 DIAGNOSIS — M25521 Pain in right elbow: Secondary | ICD-10-CM | POA: Diagnosis not present

## 2020-04-09 ENCOUNTER — Other Ambulatory Visit (INDEPENDENT_AMBULATORY_CARE_PROVIDER_SITE_OTHER): Payer: Medicare PPO

## 2020-04-09 ENCOUNTER — Other Ambulatory Visit: Payer: Self-pay | Admitting: Family Medicine

## 2020-04-09 ENCOUNTER — Other Ambulatory Visit: Payer: Self-pay

## 2020-04-09 DIAGNOSIS — E039 Hypothyroidism, unspecified: Secondary | ICD-10-CM | POA: Diagnosis not present

## 2020-04-09 DIAGNOSIS — I10 Essential (primary) hypertension: Secondary | ICD-10-CM

## 2020-04-10 ENCOUNTER — Other Ambulatory Visit: Payer: Self-pay | Admitting: Family Medicine

## 2020-04-10 DIAGNOSIS — M25521 Pain in right elbow: Secondary | ICD-10-CM | POA: Diagnosis not present

## 2020-04-10 DIAGNOSIS — M25511 Pain in right shoulder: Secondary | ICD-10-CM | POA: Diagnosis not present

## 2020-04-10 DIAGNOSIS — E039 Hypothyroidism, unspecified: Secondary | ICD-10-CM

## 2020-04-10 LAB — THYROID PANEL WITH TSH
Free Thyroxine Index: 2.2 (ref 1.4–3.8)
T3 Uptake: 32 % (ref 22–35)
T4, Total: 6.8 ug/dL (ref 5.1–11.9)
TSH: 8.82 mIU/L — ABNORMAL HIGH (ref 0.40–4.50)

## 2020-04-11 ENCOUNTER — Other Ambulatory Visit: Payer: Self-pay

## 2020-04-11 MED ORDER — LEVOTHYROXINE SODIUM 150 MCG PO TABS
150.0000 ug | ORAL_TABLET | Freq: Every day | ORAL | 2 refills | Status: DC
Start: 2020-04-11 — End: 2020-08-11

## 2020-04-18 DIAGNOSIS — M7541 Impingement syndrome of right shoulder: Secondary | ICD-10-CM | POA: Diagnosis not present

## 2020-04-18 DIAGNOSIS — M19011 Primary osteoarthritis, right shoulder: Secondary | ICD-10-CM | POA: Diagnosis not present

## 2020-05-01 ENCOUNTER — Other Ambulatory Visit: Payer: Self-pay | Admitting: Family Medicine

## 2020-05-01 DIAGNOSIS — J302 Other seasonal allergic rhinitis: Secondary | ICD-10-CM

## 2020-05-01 DIAGNOSIS — K219 Gastro-esophageal reflux disease without esophagitis: Secondary | ICD-10-CM

## 2020-05-01 DIAGNOSIS — I10 Essential (primary) hypertension: Secondary | ICD-10-CM

## 2020-05-22 DIAGNOSIS — M25511 Pain in right shoulder: Secondary | ICD-10-CM | POA: Diagnosis not present

## 2020-06-04 DIAGNOSIS — M25511 Pain in right shoulder: Secondary | ICD-10-CM | POA: Diagnosis not present

## 2020-06-06 NOTE — Progress Notes (Signed)
I connected with Aaron today by telephone and verified that I am speaking with the correct person using two identifiers. Location patient: home Location provider: work Persons participating in the virtual visit: patient, Marine scientist.    I discussed the limitations, risks, security and privacy concerns of performing an evaluation and management service by telephone and the availability of in person appointments. I also discussed with the patient that there may be a patient responsible charge related to this service. The patient expressed understanding and verbally consented to this telephonic visit.    Interactive audio and video telecommunications were attempted between this provider and patient, however failed, due to patient having technical difficulties OR patient did not have access to video capability.  We continued and completed visit with audio only.  Some vital signs may be absent or patient reported.    Subjective:   Stacy Ayers is a 79 y.o. female who presents for Medicare Annual (Subsequent) preventive examination.  Review of Systems     Cardiac Risk Factors include: advanced age (>60men, >60 women);dyslipidemia;hypertension     Objective:    Advanced Directives 06/09/2020 06/08/2019 10/25/2017 04/07/2017 08/06/2016 08/08/2015  Does Patient Have a Medical Advance Directive? Yes Yes Yes Yes Yes No  Type of Paramedic of [place];Living will [place];Living will [place];Living will [place];Living will [place];Living will -  Does patient want to make changes to medical advance directive? No - Patient declined No - Patient declined - - No - Patient declined -  Copy of [place] in Chart? No - copy requested No - copy requested No - copy requested No - copy requested No - copy requested -  Would patient like information on creating a medical advance directive? - - -  - - No - patient declined information    Current Medications (verified) Outpatient Encounter Medications as of 06/09/2020  Medication Sig  . acetaminophen (TYLENOL) 650 MG CR tablet Take 650 mg by mouth every 8 (eight) hours as needed for pain.  . cholecalciferol (VITAMIN D) 1000 UNITS tablet Take 1,000 Units by mouth daily.    . cyanocobalamin (,VITAMIN B-12,) 1000 MCG/ML injection INJECT 1ML INTO MUSCLE EVERY 30 DAYS  . denosumab (PROLIA) 60 MG/ML SOSY injection Inject 60 mg into the skin every 6 (six) months.  . fluticasone (FLONASE) 50 MCG/ACT nasal spray USE 2 SPRAYS IN EACH NOSTRIL ONCE DAILY  . levothyroxine (SYNTHROID) 150 MCG tablet Take 1 tablet (150 mcg total) by mouth daily.  Marland Kitchen loperamide (IMODIUM) 2 MG capsule Take 2 mg by mouth as needed. 2 in the morning and 2 in the afternoon  . loratadine (CLARITIN) 10 MG tablet TAKE 1 TABLET ONCE DAILY.  Marland Kitchen losartan (COZAAR) 50 MG tablet TAKE 1 TABLET ONCE DAILY.  . metoprolol tartrate (LOPRESSOR) 25 MG tablet Take 1-2 tablets by mouth as needed for palpitations.  . pantoprazole (PROTONIX) 40 MG tablet TAKE 1 TABLET ONCE DAILY.  Marland Kitchen PROAIR HFA 108 (90 Base) MCG/ACT inhaler USE 2 PUFFS EVERY 6 HOURS AS NEEDED.  . Simethicone (GAS-X EXTRA STRENGTH) 62.5 MG STRP Take by mouth. As needed   . SYMBICORT 160-4.5 MCG/ACT inhaler USE 2 PUFFS TWICE A DAY.  Marland Kitchen venlafaxine XR (EFFEXOR XR) 75 MG 24 hr capsule Take 1 capsule (75 mg total) by mouth daily with breakfast.  . zinc gluconate 50 MG tablet Take 50 mg by mouth daily.   Facility-Administered Encounter Medications as of 06/09/2020  Medication  . 0.9 %  sodium chloride infusion    Allergies (verified) Sulfonamide derivatives, Cocaine, Codeine, Montelukast sodium, Penicillins, Pioglitazone, Rosiglitazone maleate, Secobarbital sodium, and Doxycycline   History: Past Medical History:  Diagnosis Date  . Abnormal liver function tests   . Allergy   . Anxiety   . Arthritis    knees, lower back   .  Asthma   . Cataract    left eye growing cataract   . Depression   . Diabetes mellitus    had gastric bypass DM resolved-   . Diverticulosis   . GERD (gastroesophageal reflux disease)   . Heart murmur   . History of colon polyps    adenomatous  . Hyperlipidemia    past hx- resloved after gastric bypass   . Hypertension   . Hypothyroidism   . IBS (irritable bowel syndrome)   . Iron deficiency anemia   . Osteoporosis    Past Surgical History:  Procedure Laterality Date  . ABDOMINAL HYSTERECTOMY    . ABDOMINAL HYSTERECTOMY    . APPENDECTOMY    . BLADDER REPAIR    . BREAST BIOPSY Right    needle core biopsy, benign  . CATARACT EXTRACTION Left   . CHOLECYSTECTOMY    . COLONOSCOPY    . CORONARY ANGIOPLASTY    . GASTRIC BYPASS    . POLYPECTOMY    . TONSILLECTOMY AND ADENOIDECTOMY     Family History  Problem Relation Age of Onset  . Diabetes Sister   . Heart disease Maternal Grandmother   . Angina Maternal Grandmother   . Stroke Mother   . Suicidality Maternal Grandfather   . Cancer Paternal Grandmother   . Obesity Father   . Colon cancer Neg Hx   . Colon polyps Neg Hx   . Esophageal cancer Neg Hx   . Rectal cancer Neg Hx   . Stomach cancer Neg Hx   . Pancreatic cancer Neg Hx   . Liver disease Neg Hx    Social History   Socioeconomic History  . Marital status: Single    Spouse name: Not on file  . Number of children: 1  . Years of education: Not on file  . Highest education level: Not on file  Occupational History  . Occupation: retired Product manager: RETIRED  Tobacco Use  . Smoking status: Former Smoker    Packs/day: 0.30    Years: 2.00    Pack years: 0.60    Types: Cigarettes    Quit date: 07/11/1962    Years since quitting: 57.9  . Smokeless tobacco: Never Used  Substance and Sexual Activity  . Alcohol use: Not Currently    Comment: quit 2 and 1/2 months ago around 9/19  . Drug use: No  . Sexual activity: Yes  Other Topics Concern  . Not on  file  Social History Narrative   Daily caffeine   Social Determinants of Health   Financial Resource Strain: Low Risk   . Difficulty of Paying Living Expenses: Not hard at all  Food Insecurity: No Food Insecurity  . Worried About Charity fundraiser in the Last Year: Never true  . Ran Out of Food in the Last Year: Never true  Transportation Needs: No Transportation Needs  . Lack of Transportation (Medical): No  . Lack of Transportation (Non-Medical): No  Physical Activity:   . Days of Exercise per Week:   . Minutes of Exercise per Session:   Stress:   .  Feeling of Stress :   Social Connections:   . Frequency of Communication with Friends and Family:   . Frequency of Social Gatherings with Friends and Family:   . Attends Religious Services:   . Active Member of Clubs or Organizations:   . Attends Archivist Meetings:   Marland Kitchen Marital Status:     Tobacco Counseling Counseling given: Not Answered   Clinical Intake:     Pain : No/denies pain                  Activities of Daily Living In your present state of health, do you have any difficulty performing the following activities: 06/09/2020  Hearing? N  Vision? N  Difficulty concentrating or making decisions? N  Walking or climbing stairs? N  Dressing or bathing? N  Doing errands, shopping? N  Preparing Food and eating ? N  Using the Toilet? N  In the past six months, have you accidently leaked urine? N  Do you have problems with loss of bowel control? N  Managing your Medications? N  Managing your Finances? N  Housekeeping or managing your Housekeeping? N  Some recent data might be hidden    Patient Care Team: Carollee Herter, Alferd Apa, DO as PCP - Gaston Islam, MD as Consulting Physician (Orthopedic Surgery) Marica Otter, [place] as Consulting Physician (Optometry) Sharyn Lull Mottinger as Consulting Physician (Dentistry) Marica Otter, OD (Optometry)  Indicate any recent Medical Services you  may have received from other than [facility] providers in the past year (date may be approximate).     Assessment:   This is a routine wellness examination for Stacy Ayers.  Dietary issues and exercise activities discussed: Current Exercise Habits: Home exercise routine, Type of exercise: walking, Time (Minutes): 10, Frequency (Times/Week): 7, Weekly Exercise (Minutes/Week): 70, Exercise limited by: None identified Diet (meal preparation, eat out, water intake, caffeinated beverages, dairy products, fruits and vegetables): well balanced     Goals    . Increase physical activity      Depression Screen PHQ 2/9 Scores 06/09/2020 06/08/2019 10/31/2018 10/25/2017 08/06/2016 08/08/2015 12/27/2014  PHQ - 2 Score 0 0 1 0 0 0 0  PHQ- 9 Score - - 8 - - - -    Fall Risk Fall Risk  06/09/2020 06/08/2019 10/25/2017 08/06/2016 08/08/2015  Falls in the past year? 0 0 Yes Yes No  Number falls in past yr: 0 - 2 or more 1 -  Comment - - - - -  Injury with Fall? 0 - No No -  Risk for fall due to : - - - Other (Comment) -  Follow up Education provided;Falls prevention discussed - Education provided;Falls prevention discussed Falls prevention discussed -   L[facility] w/ dog [facility]. Any stairs in or around the home? No  If so, are there any without handrails? No  Home free of loose throw rugs in walkways, pet beds, electrical cords, etc? Yes  Adequate lighting in your home to reduce risk of falls? Yes   ASSISTIVE DEVICES UTILIZED TO PREVENT FALLS:  Life alert? No  Use of a cane, walker or w/c? No  Grab bars in the bathroom? No  Shower chair or bench in shower? No  Elevated toilet seat or a handicapped toilet? Yes    Cognitive Function: Ad8 score reviewed for issues:  Issues making decisions:no  Less interest in hobbies / activities:no  Repeats questions, stories (family complaining):no  Trouble using ordinary gadgets (microwave, computer, phone):no  Forgets the month or year:  no  Mismanaging finances: no  Remembering appts:no  Daily problems with thinking and/or memory:no Ad8 score is=0     MMSE - Mini Mental State Exam 10/25/2017 08/06/2016 08/08/2015  Orientation to time 5 5 5   Orientation to Place 5 5 5   Registration 3 3 3   Attention/ Calculation 5 5 5   Recall 3 3 3   Language- name 2 objects 2 2 2   Language- repeat 1 1 1   Language- follow 3 step command 3 3 3   Language- read & follow direction 1 1 1   Write a sentence 1 1 1   Copy design 1 1 1   Total score 30 30 30         Immunizations Immunization History  Administered Date(s) Administered  . Influenza Split 09/08/2011, 08/25/2012  . Influenza Whole 08/26/2010  . Influenza, High Dose Seasonal PF 08/06/2016, 08/05/2017, 10/31/2018  . Influenza, Quadrivalent, Recombinant, Inj, Pf 09/07/2019  . Influenza,inj,Quad PF,6+ Mos 08/09/2014, 08/08/2015  . Moderna SARS-COVID-2 Vaccination 12/11/2019, 01/08/2020  . Pneumococcal Conjugate-13 12/27/2014  . Pneumococcal Polysaccharide-23 08/05/2010, 08/05/2017  . Tdap 07/12/2011  . Zoster 07/21/2011  . Zoster Recombinat (Shingrix) 09/07/2019    TDAP status: Up to date Flu Vaccine status: Up to date Pneumococcal vaccine status: Up to date Covid-19 vaccine status: Completed vaccines  Qualifies for Shingles Vaccine? Yes   Zostavax completed Yes     Screening Tests Health Maintenance  Topic Date Due  . Hepatitis C Screening  Never done  . OPHTHALMOLOGY EXAM  07/21/2018  . FOOT EXAM  08/05/2018  . INFLUENZA VACCINE  06/29/2020  . HEMOGLOBIN A1C  08/02/2020  . TETANUS/TDAP  07/11/2021  . DEXA SCAN  Completed  . COVID-19 Vaccine  Completed  . PNA vac Low Risk Adult  Completed    Health Maintenance  Health Maintenance Due  Topic Date Due  . Hepatitis C Screening  Never done  . OPHTHALMOLOGY EXAM  07/21/2018  . FOOT EXAM  08/05/2018    Colorectal cancer screening: No longer required.  Mammogram status: Completed 06/22/19. Repeat every  year Bone Density status: Completed 06/22/19. Results reflect: Bone density results: OSTEOPENIA. Repeat every 2 years.  Lung Cancer Screening: (Low Dose CT Chest recommended if Age 83-80 years, 30 pack-year currently smoking OR have quit w/in 15years.) does not qualify.    Additional Screening:  Vision Screening: Recommended annual ophthalmology exams for early detection of glaucoma and other disorders of the eye. Is the patient up to date with their annual eye exam?  Yes  Who is the provider or what is the name of the office in which the patient attends annual eye exams? Dr.Miller  Dental Screening: Recommended annual dental exams for proper oral hygiene  Community Resource Referral / Chronic Care Management: CRR required this visit?  No   CCM required this visit?  No    Plan:    Please schedule your next medicare wellness visit with me in 1 yr.  Continue to eat heart healthy diet (full of fruits, vegetables, whole grains, lean protein, water--limit salt, fat, and sugar intake) and increase physical activity as tolerated.  Continue doing brain stimulating activities (puzzles, reading, adult coloring books, staying active) to keep memory sharp.   Bring a copy of your living will and/or healthcare power of attorney to your next office visit.   I have personally reviewed and noted the following in the patient's chart:   . Medical and social history . Use of alcohol, tobacco or illicit drugs  .  Current medications and supplements . Functional ability and status . Nutritional status . Physical activity . Advanced directives . List of other physicians . Hospitalizations, surgeries, and ER visits in previous 12 months . Vitals . Screenings to include cognitive, depression, and falls . Referrals and appointments  In addition, I have reviewed and discussed with patient certain preventive protocols, quality metrics, and best practice recommendations. A written personalized care  plan for preventive services as well as general preventive health recommendations were provided to patient.   Due to this being a telephonic visit, the after visit summary with patients personalized plan was offered to patient via mail or my-chart. Patient preferred to pick up at office at next visit. Shela Nevin, [place]   06/09/2020   Nurse Notes: Enjoys playing cards.

## 2020-06-09 ENCOUNTER — Ambulatory Visit (INDEPENDENT_AMBULATORY_CARE_PROVIDER_SITE_OTHER): Payer: Medicare PPO | Admitting: *Deleted

## 2020-06-09 ENCOUNTER — Other Ambulatory Visit: Payer: Self-pay

## 2020-06-09 ENCOUNTER — Encounter: Payer: Self-pay | Admitting: *Deleted

## 2020-06-09 DIAGNOSIS — Z0000 Encounter for general adult medical examination without abnormal findings: Secondary | ICD-10-CM | POA: Diagnosis not present

## 2020-06-09 NOTE — Patient Instructions (Addendum)
Please schedule your next medicare wellness visit with me in 1 yr.  Continue to eat heart healthy diet (full of fruits, vegetables, whole grains, lean protein, water--limit salt, fat, and sugar intake) and increase physical activity as tolerated.  Continue doing brain stimulating activities (puzzles, reading, adult coloring books, staying active) to keep memory sharp.   Bring a copy of your living will and/or healthcare power of attorney to your next office visit.   Stacy Ayers , Thank you for taking time to come for your Medicare Wellness Visit. I appreciate your ongoing commitment to your health goals. Please review the following plan we discussed and let me know if I can assist you in the future.   These are the goals we discussed: Goals    . Increase physical activity       This is a list of the screening recommended for you and due dates:  Health Maintenance  Topic Date Due  .  Hepatitis C: One time screening is recommended by Center for Disease Control  (CDC) for  adults born from [birth year] through 1963.   Never done  . Eye exam for diabetics  07/21/2018  . Complete foot exam   08/05/2018  . Flu Shot  06/29/2020  . Hemoglobin A1C  08/02/2020  . Tetanus Vaccine  07/11/2021  . DEXA scan (bone density measurement)  Completed  . COVID-19 Vaccine  Completed  . Pneumonia vaccines  Completed    Preventive Care 6 Years and Older, Female Preventive care refers to lifestyle choices and visits with your health care provider that can promote health and wellness. This includes:  A yearly physical exam. This is also called an annual well check.  Regular dental and eye exams.  Immunizations.  Screening for certain conditions.  Healthy lifestyle choices, such as diet and exercise. What can I expect for my preventive care visit? Physical exam Your health care provider will check:  Height and weight. These may be used to calculate body mass index (BMI), which is a measurement that tells  if you are at a healthy weight.  Heart rate and blood pressure.  Your skin for abnormal spots. Counseling Your health care provider may ask you questions about:  Alcohol, tobacco, and drug use.  Emotional well-being.  Home and relationship well-being.  Sexual activity.  Eating habits.  History of falls.  Memory and ability to understand (cognition).  Work and work Statistician.  Pregnancy and menstrual history. What immunizations do I need?  Influenza (flu) vaccine  This is recommended every year. Tetanus, diphtheria, and pertussis (Tdap) vaccine  You may need a Td booster every 10 years. Varicella (chickenpox) vaccine  You may need this vaccine if you have not already been vaccinated. Zoster (shingles) vaccine  You may need this after age 75. Pneumococcal conjugate (PCV13) vaccine  One dose is recommended after age 66. Pneumococcal polysaccharide (PPSV23) vaccine  One dose is recommended after age 56. Measles, mumps, and rubella (MMR) vaccine  You may need at least one dose of MMR if you were born in 1957 or later. You may also need a second dose. Meningococcal conjugate (MenACWY) vaccine  You may need this if you have certain conditions. Hepatitis A vaccine  You may need this if you have certain conditions or if you travel or work in places where you may be exposed to hepatitis A. Hepatitis B vaccine  You may need this if you have certain conditions or if you travel or work in places where you may  be exposed to hepatitis B. Haemophilus influenzae type b (Hib) vaccine  You may need this if you have certain conditions. You may receive vaccines as individual doses or as more than one vaccine together in one shot (combination vaccines). Talk with your health care provider about the risks and benefits of combination vaccines. What tests do I need? Blood tests  Lipid and cholesterol levels. These may be checked every 5 years, or more frequently depending on  your overall health.  Hepatitis C test.  Hepatitis B test. Screening  Lung cancer screening. You may have this screening every year starting at age 18 if you have a 30-pack-year history of smoking and currently smoke or have quit within the past 15 years.  Colorectal cancer screening. All adults should have this screening starting at age 58 and continuing until age 53. Your health care provider may recommend screening at age 46 if you are at increased risk. You will have tests every 1-10 years, depending on your results and the type of screening test.  Diabetes screening. This is done by checking your blood sugar (glucose) after you have not eaten for a while (fasting). You may have this done every 1-3 years.  Mammogram. This may be done every 1-2 years. Talk with your health care provider about how often you should have regular mammograms.  BRCA-related cancer screening. This may be done if you have a family history of breast, ovarian, tubal, or peritoneal cancers. Other tests  Sexually transmitted disease (STD) testing.  Bone density scan. This is done to screen for osteoporosis. You may have this done starting at age 26. Follow these instructions at home: Eating and drinking  Eat a diet that includes fresh fruits and vegetables, whole grains, lean protein, and low-fat dairy products. Limit your intake of foods with high amounts of sugar, saturated fats, and salt.  Take vitamin and mineral supplements as recommended by your health care provider.  Do not drink alcohol if your health care provider tells you not to drink.  If you drink alcohol: ? Limit how much you have to 0-1 drink a day. ? Be aware of how much alcohol is in your drink. In the [place] one drink equals one 12 oz bottle of beer (355 mL), one 5 oz glass of wine (148 mL), or one 1 oz glass of hard liquor (44 mL). Lifestyle  Take daily care of your teeth and gums.  Stay active. Exercise for at least 30 minutes on 5 or  more days each week.  Do not use any products that contain nicotine or tobacco, such as cigarettes, e-cigarettes, and chewing tobacco. If you need help quitting, ask your health care provider.  If you are sexually active, practice safe sex. Use a condom or other form of protection in order to prevent STIs (sexually transmitted infections).  Talk with your health care provider about taking a low-dose aspirin or statin. What's next?  Go to your health care provider once a year for a well check visit.  Ask your health care provider how often you should have your eyes and teeth checked.  Stay up to date on all vaccines. This information is not intended to replace advice given to you by your health care provider. Make sure you discuss any questions you have with your health care provider. Document Revised: 11/09/2018 Document Reviewed: 11/09/2018 Elsevier Patient Education  2020 [employer].

## 2020-06-11 ENCOUNTER — Other Ambulatory Visit (INDEPENDENT_AMBULATORY_CARE_PROVIDER_SITE_OTHER): Payer: Medicare PPO

## 2020-06-11 ENCOUNTER — Other Ambulatory Visit: Payer: Self-pay

## 2020-06-11 DIAGNOSIS — E039 Hypothyroidism, unspecified: Secondary | ICD-10-CM | POA: Diagnosis not present

## 2020-06-12 ENCOUNTER — Other Ambulatory Visit: Payer: Self-pay | Admitting: Family Medicine

## 2020-06-12 DIAGNOSIS — K219 Gastro-esophageal reflux disease without esophagitis: Secondary | ICD-10-CM

## 2020-06-12 LAB — THYROID PANEL WITH TSH
Free Thyroxine Index: 3.7 (ref 1.4–3.8)
T3 Uptake: 34 % (ref 22–35)
T4, Total: 10.8 ug/dL (ref 5.1–11.9)
TSH: 0.16 mIU/L — ABNORMAL LOW (ref 0.40–4.50)

## 2020-06-13 ENCOUNTER — Other Ambulatory Visit: Payer: Self-pay | Admitting: Family Medicine

## 2020-06-13 DIAGNOSIS — I10 Essential (primary) hypertension: Secondary | ICD-10-CM

## 2020-06-13 DIAGNOSIS — J302 Other seasonal allergic rhinitis: Secondary | ICD-10-CM

## 2020-06-17 ENCOUNTER — Telehealth: Payer: Self-pay | Admitting: Family Medicine

## 2020-06-17 MED ORDER — LEVOTHYROXINE SODIUM 137 MCG PO TABS: 137.0000 ug | ORAL_TABLET | Freq: Every day | ORAL | 5 refills | Status: DC

## 2020-06-17 NOTE — Addendum Note (Signed)
Addended by: Wynonia Musty A on: 06/17/2020 12:01 PM   Modules accepted: Orders

## 2020-06-17 NOTE — Telephone Encounter (Signed)
levothyroxine (SYNTHROID) 137 MCG tablet [[id number]]  Patient states medication increased. Patient would like to know why ?  Please advise

## 2020-06-18 DIAGNOSIS — S43431D Superior glenoid labrum lesion of right shoulder, subsequent encounter: Secondary | ICD-10-CM | POA: Diagnosis not present

## 2020-06-18 DIAGNOSIS — M75121 Complete rotator cuff tear or rupture of right shoulder, not specified as traumatic: Secondary | ICD-10-CM | POA: Diagnosis not present

## 2020-06-18 NOTE — Telephone Encounter (Signed)
Pt notified.  Verbalized understanding.

## 2020-06-18 NOTE — Telephone Encounter (Signed)
No -- she is hyper thyroid---- she is taking too much---- when the tsh is low it means hyPER thyroid

## 2020-06-18 NOTE — Telephone Encounter (Signed)
Spoke with patient. Pt states if she has been taking 150 MCG of the Levothyroxine daily shouldn't the medication be increased instead of decreased every other day.  I tried to explain to the patient she needed to alternate but states meds she be increased. Please advise

## 2020-07-02 DIAGNOSIS — M25511 Pain in right shoulder: Secondary | ICD-10-CM | POA: Diagnosis not present

## 2020-07-02 DIAGNOSIS — M25811 Other specified joint disorders, right shoulder: Secondary | ICD-10-CM | POA: Diagnosis not present

## 2020-07-03 ENCOUNTER — Telehealth: Payer: Self-pay | Admitting: Family Medicine

## 2020-07-03 NOTE — Telephone Encounter (Signed)
FYI

## 2020-07-03 NOTE — Telephone Encounter (Signed)
Patient states that she is having surgery to reserve a shoulder replacement at [facility] with Doctor Supple on July 15 2020

## 2020-07-04 NOTE — Progress Notes (Addendum)
COVID Vaccine Completed: x2 Date COVID Vaccine completed: 12-11-19 & 01-08-20 COVID vaccine manufacturer: [place]   PCP - Roma Schanz, DO Cardiologist -  Saw Dr. Shelva Majestic 04-18-17 for palpitations.  Prescribed med but not having palpations currently  Chest x-ray -  N/A EKG - 07-09-20 Stress Test - N/A ECHO - N/A Cardiac Cath - Not recent  Sleep Study - N/A CPAP - N/A  Fasting Blood Sugar - N/A Checks Blood Sugar - does not check.  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  No problems with intubation with previous surgery.  Patient denies shortness of breath, fever, cough and chest pain at PAT appointment   Patient verbalized understanding of instructions that were given to them at the PAT appointment. Patient was also instructed that they will need to review over the PAT instructions again at home before surgery.

## 2020-07-04 NOTE — Progress Notes (Signed)
Please enter orders for PAT visit 07-09-20.  Surgery scheduled for 8-17.  Thank you

## 2020-07-04 NOTE — Patient Instructions (Addendum)
DUE TO COVID-19 ONLY ONE VISITOR IS ALLOWED TO COME WITH YOU AND STAY IN THE WAITING ROOM ONLY DURING PRE OP  AND  PROCEDURE.   IF YOU WILL BE ADMITTED INTO THE HOSPITAL YOU ARE ALLOWED ONE SUPPORT PERSON DURING VISITATION HOURS ONLY  (10AM -8PM)    The support person may change daily.  The support person must pass our screening, gel in and out, and wear a mask at all times, including in the patients room.  Patients must also wear a mask when staff or their support person are in the room.   COVID SWAB TESTING MUST BE COMPLETED ON:  Friday, 07-11-20 @ 1:05 PM    [address] [place], [place] [address]  (Must self quarantine after testing. Follow instructions on handout.)        Your procedure is scheduled on: Tuesday, 07-15-20   Report to [facility] Main  Entrance   Report to Short Stay at 5:30 AM   [facility])      Call this number if you have problems the morning of surgery [phone]   Do not eat food :After Midnight.   May have liquids until  04:30 AM  day of surgery  CLEAR LIQUID DIET  Foods Allowed                                                                     Foods Excluded  Water, Black Coffee and tea, regular and decaf             liquids that you cannot  Plain Jell-O in any flavor  (No red)                                    see through such as: Fruit ices (not with fruit pulp)                                      milk, soups, orange juice              Iced Popsicles (No red)                                      All solid food                                   Apple juices Sports drinks like Gatorade (No red) Lightly seasoned clear broth or consume(fat free) Sugar, honey syrup     Complete one G2 drink the morning of surgery at  4:30 AM  the day of surgery.    Oral Hygiene is also important to reduce your risk of infection.                                     Remember - BRUSH YOUR TEETH THE MORNING OF SURGERY WITH YOUR REGULAR  TOOTHPASTE  Do NOT smoke after Midnight   Take these medicines the morning of surgery with A SIP OF WATER:  Levothyroxine, Loratadine, Metoprolol, Pantoprazole, Tramadol,  Venlafaxine.   Okay to use inhalers and nasal spray                                You may not have any metal on your body including hair pins, jewelry, and body piercings              Do not wear make-up, lotions, powders, perfumes/cologne, or deodorant              Do not wear nail polish.  Do not shave  48 hours prior to surgery.                Do not bring valuables to the hospital. Stacy Ayers.   Contacts, dentures or bridgework may not be worn into surgery.      Patients discharged the day of surgery will not be allowed to drive home.   Special Instructions: Bring a copy of your healthcare power of attorney and living will documents  the day of surgery if you haven't  scanned them in  before.              Please read over the following fact sheets you were given: IF YOU HAVE QUESTIONS ABOUT YOUR PRE OP INSTRUCTIONS PLEASE  CALL  [place]- Preparing for Total Shoulder Arthroplasty    Before surgery, you can play an important role. Because skin is not sterile, your skin needs to be as free of germs as possible. You can reduce the number of germs on your skin by using the following products.  Benzoyl Peroxide Gel o Reduces the number of germs present on the skin o Applied twice a day to shoulder area starting two days before surgery    ==================================================================  Please follow these instructions carefully:  BENZOYL PEROXIDE 5% GEL  Please do not use if you have an allergy to benzoyl peroxide.   If your skin becomes reddened/irritated stop using the benzoyl peroxide.  Starting two days before surgery, apply as follows: 1. Apply benzoyl peroxide in the morning and at night. Apply after taking a shower. If you  are not taking a shower clean entire shoulder front, back, and side along with the armpit with a clean wet washcloth.  2. Place a quarter-sized dollop on your shoulder and rub in thoroughly, making sure to cover the front, back, and side of your shoulder, along with the armpit.   2 days before ____ AM   ____ PM              1 day before ____ AM   ____ PM                         3. Do this twice a day for two days.  (Last application is the night before surgery, AFTER using the CHG soap as described below).  4. Do NOT apply benzoyl peroxide gel on the day of surgery.   [place] - Preparing for Surgery Before surgery, you can play an important role.  Because skin is not sterile, your skin needs to be as free of germs as possible.  You can reduce the number of germs on your skin by washing with CHG (  chlorahexidine gluconate) soap before surgery.  CHG is an antiseptic cleaner which kills germs and bonds with the skin to continue killing germs even after washing. Please DO NOT use if you have an allergy to CHG or antibacterial soaps.  If your skin becomes reddened/irritated stop using the CHG and inform your nurse when you arrive at Short Stay. Do not shave (including legs and underarms) for at least 48 hours prior to the first CHG shower.  You may shave your face/neck.  Please follow these instructions carefully:  1.  Shower with CHG Soap the night before surgery and the  morning of surgery.  2.  If you choose to wash your hair, wash your hair first as usual with your normal  shampoo.  3.  After you shampoo, rinse your hair and body thoroughly to remove the shampoo.                             4.  Use CHG as you would any other liquid soap.  You can apply chg directly to the skin and wash.  Gently with a scrungie or clean washcloth.  5.  Apply the CHG Soap to your body ONLY FROM THE NECK DOWN.   Do   not use on face/ open                           Wound or open sores. Avoid contact with eyes,  ears mouth and   genitals (private parts).                       Wash face,  Genitals (private parts) with your normal soap.             6.  Wash thoroughly, paying special attention to the area where your    surgery  will be performed.  7.  Thoroughly rinse your body with warm water from the neck down.  8.  DO NOT shower/wash with your normal soap after using and rinsing off the CHG Soap.                9.  Pat yourself dry with a clean towel.            10.  Wear clean pajamas.            11.  Place clean sheets on your bed the night of your first shower and do not  sleep with pets. Day of Surgery : Do not apply any lotions/deodorants the morning of surgery.  Please wear clean clothes to the hospital/surgery center.  FAILURE TO FOLLOW THESE INSTRUCTIONS MAY RESULT IN THE CANCELLATION OF YOUR SURGERY  PATIENT SIGNATURE_________________________________  NURSE SIGNATURE__________________________________  ________________________________________________________________________    Stacy Ayers  An incentive spirometer is a tool that can help keep your lungs clear and active. This tool measures how well you are filling your lungs with each breath. Taking long deep breaths may help reverse or decrease the chance of developing breathing (pulmonary) problems (especially infection) following:  A long period of time when you are unable to move or be active. BEFORE THE PROCEDURE   If the spirometer includes an indicator to show your best effort, your nurse or respiratory therapist will set it to a desired goal.  If possible, sit up straight or lean slightly forward. Try not to slouch.  Hold the incentive spirometer in an upright  position. INSTRUCTIONS FOR USE  1. Sit on the edge of your bed if possible, or sit up as far as you can in bed or on a chair. 2. Hold the incentive spirometer in an upright position. 3. Breathe out normally. 4. Place the mouthpiece in your mouth and seal  your lips tightly around it. 5. Breathe in slowly and as deeply as possible, raising the piston or the ball toward the top of the column. 6. Hold your breath for 3-5 seconds or for as long as possible. Allow the piston or ball to fall to the bottom of the column. 7. Remove the mouthpiece from your mouth and breathe out normally. 8. Rest for a few seconds and repeat Steps 1 through 7 at least 10 times every 1-2 hours when you are awake. Take your time and take a few normal breaths between deep breaths. 9. The spirometer may include an indicator to show your best effort. Use the indicator as a goal to work toward during each repetition. 10. After each set of 10 deep breaths, practice coughing to be sure your lungs are clear. If you have an incision (the cut made at the time of surgery), support your incision when coughing by placing a pillow or rolled up towels firmly against it. Once you are able to get out of bed, walk around indoors and cough well. You may stop using the incentive spirometer when instructed by your caregiver.  RISKS AND COMPLICATIONS  Take your time so you do not get dizzy or light-headed.  If you are in pain, you may need to take or ask for pain medication before doing incentive spirometry. It is harder to take a deep breath if you are having pain. AFTER USE  Rest and breathe slowly and easily.  It can be helpful to keep track of a log of your progress. Your caregiver can provide you with a simple table to help with this. If you are using the spirometer at home, follow these instructions: [place] IF:   You are having difficultly using the spirometer.  You have trouble using the spirometer as often as instructed.  Your pain medication is not giving enough relief while using the spirometer.  You develop fever of 100.5 F (38.1 C) or higher. SEEK IMMEDIATE MEDICAL CARE IF:   You cough up bloody sputum that had not been present before.  You develop fever of  102 F (38.9 C) or greater.  You develop worsening pain at or near the incision site. MAKE SURE YOU:   Understand these instructions.  Will watch your condition.  Will get help right away if you are not doing well or get worse. Document Released: 03/28/2007 Document Revised: 02/07/2012 Document Reviewed: 05/29/2007 [facility] Patient Information 2014 [place], [place].   ________________________________________________________________________

## 2020-07-09 ENCOUNTER — Other Ambulatory Visit: Payer: Self-pay

## 2020-07-09 ENCOUNTER — Encounter (HOSPITAL_COMMUNITY)
Admission: RE | Admit: 2020-07-09 | Discharge: 2020-07-09 | Disposition: A | Discharge status: Home / Self Care | Payer: Medicare PPO | Source: Ambulatory Visit | Attending: Orthopedic Surgery | Admitting: Orthopedic Surgery

## 2020-07-09 ENCOUNTER — Encounter (HOSPITAL_COMMUNITY): Payer: Self-pay

## 2020-07-09 DIAGNOSIS — Z79899 Other long term (current) drug therapy: Secondary | ICD-10-CM | POA: Insufficient documentation

## 2020-07-09 DIAGNOSIS — Z01818 Encounter for other preprocedural examination: Secondary | ICD-10-CM | POA: Insufficient documentation

## 2020-07-09 DIAGNOSIS — E0590 Thyrotoxicosis, unspecified without thyrotoxic crisis or storm: Secondary | ICD-10-CM | POA: Insufficient documentation

## 2020-07-09 DIAGNOSIS — M75101 Unspecified rotator cuff tear or rupture of right shoulder, not specified as traumatic: Secondary | ICD-10-CM | POA: Insufficient documentation

## 2020-07-09 DIAGNOSIS — I10 Essential (primary) hypertension: Secondary | ICD-10-CM | POA: Diagnosis not present

## 2020-07-09 DIAGNOSIS — Z9884 Bariatric surgery status: Secondary | ICD-10-CM | POA: Insufficient documentation

## 2020-07-09 DIAGNOSIS — Z87891 Personal history of nicotine dependence: Secondary | ICD-10-CM | POA: Insufficient documentation

## 2020-07-09 DIAGNOSIS — Z79890 Hormone replacement therapy (postmenopausal): Secondary | ICD-10-CM | POA: Diagnosis not present

## 2020-07-09 HISTORY — DX: Headache, unspecified: R51.9

## 2020-07-09 LAB — CBC
HCT: 34.7 % — ABNORMAL LOW (ref 36.0–46.0)
Hemoglobin: 11.4 g/dL — ABNORMAL LOW (ref 12.0–15.0)
MCH: 32.6 pg (ref 26.0–34.0)
MCHC: 32.9 g/dL (ref 30.0–36.0)
MCV: 99.1 fL (ref 80.0–100.0)
Platelets: 208 10*3/uL (ref 150–400)
RBC: 3.5 MIL/uL — ABNORMAL LOW (ref 3.87–5.11)
RDW: 12.3 % (ref 11.5–15.5)
WBC: 5 10*3/uL (ref 4.0–10.5)
nRBC: 0 % (ref 0.0–0.2)

## 2020-07-09 LAB — HEMOGLOBIN A1C
Hgb A1c MFr Bld: 5.2 % (ref 4.8–5.6)
Mean Plasma Glucose: 102.54 mg/dL

## 2020-07-09 LAB — BASIC METABOLIC PANEL
Anion gap: 7 (ref 5–15)
BUN: 10 mg/dL (ref 8–23)
CO2: 28 mmol/L (ref 22–32)
Calcium: 9.6 mg/dL (ref 8.9–10.3)
Chloride: 102 mmol/L (ref 98–111)
Creatinine, Ser: 0.64 mg/dL (ref 0.44–1.00)
GFR calc Af Amer: >60 mL/min (ref >60)
GFR calc non Af Amer: >60 mL/min (ref >60)
Glucose, Bld: 114 mg/dL — ABNORMAL HIGH (ref 70–99)
Potassium: 4.6 mmol/L (ref 3.5–5.1)
Sodium: 137 mmol/L (ref 135–145)

## 2020-07-09 LAB — GLUCOSE, CAPILLARY: Glucose-Capillary: 107 mg/dL — ABNORMAL HIGH (ref 70–99)

## 2020-07-09 LAB — SURGICAL PCR SCREEN
MRSA, PCR: NEGATIVE
Staphylococcus aureus: NEGATIVE

## 2020-07-10 NOTE — Anesthesia Preprocedure Evaluation (Addendum)
Anesthesia Evaluation  Patient identified by MRN, date of birth, ID band Patient awake    Reviewed: Allergy & Precautions, NPO status , Patient's Chart, lab work & pertinent test results  History of Anesthesia Complications Negative for: history of anesthetic complications  Airway Mallampati: II  TM Distance: >3 FB Neck ROM: Full    Dental  (+) Dental Advisory Given   Pulmonary neg shortness of breath, asthma , neg sleep apnea, neg COPD, neg recent URI, former smoker,    breath sounds clear to auscultation       Cardiovascular hypertension, Pt. on medications and Pt. on home beta blockers (-) angina(-) Past MI and (-) CHF  Rhythm:Regular     Neuro/Psych  Headaches, PSYCHIATRIC DISORDERS Anxiety Depression  Neuromuscular disease    GI/Hepatic Neg liver ROS, GERD  Medicated and Controlled,  Endo/Other  diabetesHypothyroidism   Renal/GU negative Renal ROS     Musculoskeletal  (+) Arthritis ,   Abdominal   Peds  Hematology  (+) Blood dyscrasia, anemia ,   Anesthesia Other Findings Pt reports to PAT nurse she has h/o vocal cord paralysis/edema; she is a Primary school teacher.  She denies trouble with anesthesia in the past.  GA with ERCP 10/25/2012 with no anesthesia complications noted.    Reproductive/Obstetrics                           Anesthesia Physical Anesthesia Plan  ASA: II  Anesthesia Plan: General and Regional   Post-op Pain Management:  Regional for Post-op pain   Induction: Intravenous  PONV Risk Score and Plan: 3 and Ondansetron and Dexamethasone  Airway Management Planned: Oral ETT  Additional Equipment: None  Intra-op Plan:   Post-operative Plan: Extubation in OR  Informed Consent: I have reviewed the patients History and Physical, chart, labs and discussed the procedure including the risks, benefits and alternatives for the proposed anesthesia with the patient or authorized  representative who has indicated his/her understanding and acceptance.     Dental advisory given  Plan Discussed with: CRNA and Surgeon  Anesthesia Plan Comments: (See PAT note 07/09/2020, Konrad Felix, PA-C)       Anesthesia Quick Evaluation

## 2020-07-10 NOTE — Progress Notes (Signed)
Anesthesia Chart Review   Case: [id number] Date/Time: 07/15/20 0715   Procedure: REVERSE SHOULDER ARTHROPLASTY (Right Shoulder) - 136min   Anesthesia type: General   Pre-op diagnosis: Right shoulder rotator cuff tear arthropathy   Location: [facility] ROOM 05 / [facility]   Surgeons: Justice Britain, MD      DISCUSSION:79 y.o. former smoker (0.6 pack years, quit 07/11/62) with h/o GERD, HTN, asthma, hyperthyroidism, s/p gastric bypass, right shoulder rotator cuff tear scheduled for above procedure 07/15/2020 with Dr. Onnie Graham.   Pt last seen by PCP 01/31/2020.  Stable at this visit.    Pt reports to PAT nurse she has h/o vocal cord paralysis/edema; she is a Primary school teacher.  She denies trouble with anesthesia in the past.  GA with ERCP 10/25/2012 with no anesthesia complications noted.   VS: BP 132/64   Pulse 78   Temp 37.1 C (Oral)   Resp 18   Ht 5' 7.5" (1.715 m)   Wt 78.7 kg   SpO2 100%   BMI 26.76 kg/m   PROVIDERS: [name], DO is PCP    LABS: Labs reviewed: Acceptable for surgery. (all labs ordered are listed, but only abnormal results are displayed)  Labs Reviewed  BASIC METABOLIC PANEL - Abnormal; Notable for the following components:      Result Value   Glucose, Bld 114 (*)    All other components within normal limits  CBC - Abnormal; Notable for the following components:   RBC 3.50 (*)    Hemoglobin 11.4 (*)    HCT 34.7 (*)    All other components within normal limits  GLUCOSE, CAPILLARY - Abnormal; Notable for the following components:   Glucose-Capillary 107 (*)    All other components within normal limits  SURGICAL PCR SCREEN  HEMOGLOBIN A1C     IMAGES:   EKG: 07/09/2020 Rate 78 bpm Normal sinus rhythm Poor R wave progression Borderline Low voltage QRS Abnormal ECG No significant change since last tracing  CV:  Past Medical History:  Diagnosis Date  . Abnormal liver function tests   . Allergy   . Anxiety   . Arthritis    knees, lower back   . Asthma    . Cataract    left eye growing cataract   . Depression   . Diabetes mellitus    had gastric bypass DM resolved-   . Diverticulosis   . GERD (gastroesophageal reflux disease)   . Headache    Previous migraines  . Heart murmur   . History of colon polyps    adenomatous  . Hyperlipidemia    past hx- resloved after gastric bypass   . Hypertension   . Hypothyroidism   . IBS (irritable bowel syndrome)   . Iron deficiency anemia   . Osteoporosis     Past Surgical History:  Procedure Laterality Date  . ABDOMINAL HYSTERECTOMY    . ABDOMINAL HYSTERECTOMY    . APPENDECTOMY    . BLADDER REPAIR    . BREAST BIOPSY Right    needle core biopsy, benign  . CATARACT EXTRACTION Left   . CHOLECYSTECTOMY    . COLONOSCOPY    . CORONARY ANGIOPLASTY    . GASTRIC BYPASS    . POLYPECTOMY    . TONSILLECTOMY AND ADENOIDECTOMY      MEDICATIONS: . acetaminophen (TYLENOL) 650 MG CR tablet  . cholecalciferol (VITAMIN D) 1000 UNITS tablet  . denosumab (PROLIA) 60 MG/ML SOSY injection  . fluticasone (FLONASE) 50 MCG/ACT nasal spray  .  levothyroxine (SYNTHROID) 137 MCG tablet  . levothyroxine (SYNTHROID) 150 MCG tablet  . loperamide (IMODIUM) 2 MG capsule  . loratadine (CLARITIN) 10 MG tablet  . losartan (COZAAR) 50 MG tablet  . metoprolol tartrate (LOPRESSOR) 25 MG tablet  . pantoprazole (PROTONIX) 40 MG tablet  . PROAIR HFA 108 (90 Base) MCG/ACT inhaler  . Probiotic Product (PROBIOTIC DAILY) CAPS  . SYMBICORT 160-4.5 MCG/ACT inhaler  . traMADol (ULTRAM) 50 MG tablet  . venlafaxine XR (EFFEXOR XR) 75 MG 24 hr capsule  . vitamin B-12 (CYANOCOBALAMIN) 1000 MCG tablet   . 0.9 %  sodium chloride infusion    Konrad Felix, PA-C [facility] Pre-Surgical Testing [phone]

## 2020-07-11 ENCOUNTER — Other Ambulatory Visit (HOSPITAL_COMMUNITY)
Admission: RE | Admit: 2020-07-11 | Discharge: 2020-07-11 | Disposition: A | Discharge status: Home / Self Care | Payer: Medicare PPO | Source: Ambulatory Visit | Attending: Orthopedic Surgery | Admitting: Orthopedic Surgery

## 2020-07-11 DIAGNOSIS — Z01812 Encounter for preprocedural laboratory examination: Secondary | ICD-10-CM | POA: Insufficient documentation

## 2020-07-11 DIAGNOSIS — Z20822 Contact with and (suspected) exposure to covid-19: Secondary | ICD-10-CM | POA: Insufficient documentation

## 2020-07-11 LAB — SARS CORONAVIRUS 2 (TAT 6-24 HRS): SARS Coronavirus 2: NEGATIVE

## 2020-07-14 ENCOUNTER — Telehealth: Payer: Self-pay | Admitting: Family Medicine

## 2020-07-14 NOTE — Telephone Encounter (Signed)
Stacy Ayers is requesting fax back .   Patient has surgery in the morning   FL-2 FORM  Admitting orders to [facility]  Fax [phone]

## 2020-07-15 ENCOUNTER — Encounter (HOSPITAL_COMMUNITY)
Admission: RE | Disposition: A | Discharge status: Home / Self Care | Payer: Self-pay | Source: Home / Self Care | Attending: Orthopedic Surgery

## 2020-07-15 ENCOUNTER — Other Ambulatory Visit: Payer: Self-pay

## 2020-07-15 ENCOUNTER — Ambulatory Visit (HOSPITAL_COMMUNITY): Payer: Medicare PPO | Admitting: Physician Assistant

## 2020-07-15 ENCOUNTER — Ambulatory Visit (HOSPITAL_COMMUNITY): Payer: Medicare PPO | Admitting: Registered Nurse

## 2020-07-15 ENCOUNTER — Encounter (HOSPITAL_COMMUNITY): Payer: Self-pay | Admitting: Orthopedic Surgery

## 2020-07-15 ENCOUNTER — Ambulatory Visit (HOSPITAL_COMMUNITY)
Admission: RE | Admit: 2020-07-15 | Discharge: 2020-07-15 | Disposition: A | Discharge status: Home / Self Care | Payer: Medicare PPO | Attending: Orthopedic Surgery | Admitting: Orthopedic Surgery

## 2020-07-15 DIAGNOSIS — G8918 Other acute postprocedural pain: Secondary | ICD-10-CM | POA: Diagnosis not present

## 2020-07-15 DIAGNOSIS — I10 Essential (primary) hypertension: Secondary | ICD-10-CM | POA: Insufficient documentation

## 2020-07-15 DIAGNOSIS — Z79890 Hormone replacement therapy (postmenopausal): Secondary | ICD-10-CM | POA: Diagnosis not present

## 2020-07-15 DIAGNOSIS — F329 Major depressive disorder, single episode, unspecified: Secondary | ICD-10-CM | POA: Insufficient documentation

## 2020-07-15 DIAGNOSIS — M479 Spondylosis, unspecified: Secondary | ICD-10-CM | POA: Insufficient documentation

## 2020-07-15 DIAGNOSIS — Z96611 Presence of right artificial shoulder joint: Secondary | ICD-10-CM

## 2020-07-15 DIAGNOSIS — Z9884 Bariatric surgery status: Secondary | ICD-10-CM | POA: Diagnosis not present

## 2020-07-15 DIAGNOSIS — F419 Anxiety disorder, unspecified: Secondary | ICD-10-CM | POA: Insufficient documentation

## 2020-07-15 DIAGNOSIS — Z79899 Other long term (current) drug therapy: Secondary | ICD-10-CM | POA: Diagnosis not present

## 2020-07-15 DIAGNOSIS — M75101 Unspecified rotator cuff tear or rupture of right shoulder, not specified as traumatic: Secondary | ICD-10-CM | POA: Insufficient documentation

## 2020-07-15 DIAGNOSIS — E039 Hypothyroidism, unspecified: Secondary | ICD-10-CM | POA: Diagnosis not present

## 2020-07-15 DIAGNOSIS — Z87891 Personal history of nicotine dependence: Secondary | ICD-10-CM | POA: Diagnosis not present

## 2020-07-15 DIAGNOSIS — M13811 Other specified arthritis, right shoulder: Secondary | ICD-10-CM | POA: Insufficient documentation

## 2020-07-15 DIAGNOSIS — K219 Gastro-esophageal reflux disease without esophagitis: Secondary | ICD-10-CM | POA: Diagnosis not present

## 2020-07-15 DIAGNOSIS — M19011 Primary osteoarthritis, right shoulder: Secondary | ICD-10-CM | POA: Diagnosis not present

## 2020-07-15 DIAGNOSIS — M170 Bilateral primary osteoarthritis of knee: Secondary | ICD-10-CM | POA: Insufficient documentation

## 2020-07-15 DIAGNOSIS — Z8249 Family history of ischemic heart disease and other diseases of the circulatory system: Secondary | ICD-10-CM | POA: Insufficient documentation

## 2020-07-15 HISTORY — PX: REVERSE SHOULDER ARTHROPLASTY: SHX5054

## 2020-07-15 SURGERY — ARTHROPLASTY, SHOULDER, TOTAL, REVERSE
Anesthesia: Regional | Site: Shoulder | Laterality: Right

## 2020-07-15 MED ORDER — ONDANSETRON HCL 4 MG/2ML IJ SOLN
INTRAMUSCULAR | Status: DC | PRN
  Administered 2020-07-15: 4 mg via INTRAVENOUS

## 2020-07-15 MED ORDER — OXYCODONE HCL 5 MG PO TABS: 5.0000 mg | ORAL_TABLET | Freq: Once | ORAL | Status: DC | PRN

## 2020-07-15 MED ORDER — PHENYLEPHRINE HCL-NACL 20-0.9 MG/250ML-% IV SOLN
INTRAVENOUS | Status: DC | PRN
  Administered 2020-07-15: 20 ug/min via INTRAVENOUS

## 2020-07-15 MED ORDER — LIDOCAINE 2% (20 MG/ML) 5 ML SYRINGE
INTRAMUSCULAR | Status: AC
  Filled 2020-07-15: qty 5

## 2020-07-15 MED ORDER — FENTANYL CITRATE (PF) 100 MCG/2ML IJ SOLN
INTRAMUSCULAR | Status: AC
  Filled 2020-07-15: qty 2

## 2020-07-15 MED ORDER — FENTANYL CITRATE (PF) 100 MCG/2ML IJ SOLN
INTRAMUSCULAR | Status: DC | PRN
  Administered 2020-07-15: 50 ug via INTRAVENOUS

## 2020-07-15 MED ORDER — LACTATED RINGERS IV BOLUS: 250.0000 mL | Freq: Once | INTRAVENOUS | Status: DC

## 2020-07-15 MED ORDER — CYCLOBENZAPRINE HCL 10 MG PO TABS
10.0000 mg | ORAL_TABLET | Freq: Three times a day (TID) | ORAL | 1 refills | Status: DC | PRN
Start: 2020-07-15 — End: 2021-10-20

## 2020-07-15 MED ORDER — SUGAMMADEX SODIUM 200 MG/2ML IV SOLN
INTRAVENOUS | Status: DC | PRN
  Administered 2020-07-15: 150 mg via INTRAVENOUS

## 2020-07-15 MED ORDER — LIDOCAINE 2% (20 MG/ML) 5 ML SYRINGE
INTRAMUSCULAR | Status: DC | PRN
  Administered 2020-07-15: 80 mg via INTRAVENOUS

## 2020-07-15 MED ORDER — STERILE WATER FOR IRRIGATION IR SOLN
Status: DC | PRN
  Administered 2020-07-15 (×2): 1000 mL

## 2020-07-15 MED ORDER — CEFAZOLIN SODIUM-DEXTROSE 2-4 GM/100ML-% IV SOLN
2.0000 g | INTRAVENOUS | Status: AC
  Administered 2020-07-15: 2 g via INTRAVENOUS
  Filled 2020-07-15: qty 100

## 2020-07-15 MED ORDER — ACETAMINOPHEN 160 MG/5ML PO SOLN: 1000.0000 mg | Freq: Once | ORAL | Status: DC | PRN

## 2020-07-15 MED ORDER — TRAMADOL HCL 50 MG PO TABS: 50.0000 mg | ORAL_TABLET | Freq: Four times a day (QID) | ORAL | 0 refills | Status: DC | PRN

## 2020-07-15 MED ORDER — DOCUSATE SODIUM 100 MG PO CAPS: 100.0000 mg | ORAL_CAPSULE | Freq: Two times a day (BID) | ORAL | Status: DC

## 2020-07-15 MED ORDER — MENTHOL 3 MG MT LOZG: 1.0000 | LOZENGE | OROMUCOSAL | Status: DC | PRN

## 2020-07-15 MED ORDER — NAPROXEN 500 MG PO TABS
500.0000 mg | ORAL_TABLET | Freq: Two times a day (BID) | ORAL | 1 refills | Status: DC
Start: 2020-07-15 — End: 2020-07-16

## 2020-07-15 MED ORDER — METOCLOPRAMIDE HCL 5 MG/ML IJ SOLN: 5.0000 mg | Freq: Three times a day (TID) | INTRAMUSCULAR | Status: DC | PRN

## 2020-07-15 MED ORDER — ONDANSETRON HCL 4 MG PO TABS: 4.0000 mg | ORAL_TABLET | Freq: Three times a day (TID) | ORAL | 0 refills | Status: DC | PRN

## 2020-07-15 MED ORDER — CHLORHEXIDINE GLUCONATE 0.12 % MT SOLN
15.0000 mL | Freq: Once | OROMUCOSAL | Status: AC
  Administered 2020-07-15: 15 mL via OROMUCOSAL

## 2020-07-15 MED ORDER — POLYETHYLENE GLYCOL 3350 17 G PO PACK: 17.0000 g | PACK | Freq: Every day | ORAL | Status: DC | PRN

## 2020-07-15 MED ORDER — ONDANSETRON HCL 4 MG PO TABS
4.0000 mg | ORAL_TABLET | Freq: Four times a day (QID) | ORAL | Status: DC | PRN
  Filled 2020-07-15: qty 1

## 2020-07-15 MED ORDER — ALUM & MAG HYDROXIDE-SIMETH 200-200-20 MG/5ML PO SUSP: 30.0000 mL | ORAL | Status: DC | PRN

## 2020-07-15 MED ORDER — DEXAMETHASONE SODIUM PHOSPHATE 10 MG/ML IJ SOLN
INTRAMUSCULAR | Status: DC | PRN
  Administered 2020-07-15: 8 mg via INTRAVENOUS

## 2020-07-15 MED ORDER — ONDANSETRON HCL 4 MG/2ML IJ SOLN
INTRAMUSCULAR | Status: AC
  Filled 2020-07-15: qty 2

## 2020-07-15 MED ORDER — SODIUM CHLORIDE 0.9 % IR SOLN
Status: DC | PRN
  Administered 2020-07-15: 1000 mL

## 2020-07-15 MED ORDER — METOCLOPRAMIDE HCL 5 MG PO TABS
5.0000 mg | ORAL_TABLET | Freq: Three times a day (TID) | ORAL | Status: DC | PRN
  Filled 2020-07-15: qty 2

## 2020-07-15 MED ORDER — TRANEXAMIC ACID-NACL 1000-0.7 MG/100ML-% IV SOLN
1000.0000 mg | INTRAVENOUS | Status: AC
  Administered 2020-07-15: 1000 mg via INTRAVENOUS
  Filled 2020-07-15: qty 100

## 2020-07-15 MED ORDER — ROCURONIUM BROMIDE 10 MG/ML (PF) SYRINGE
PREFILLED_SYRINGE | INTRAVENOUS | Status: AC
  Filled 2020-07-15: qty 10

## 2020-07-15 MED ORDER — MAGNESIUM CITRATE PO SOLN: 1.0000 | Freq: Once | ORAL | Status: DC | PRN

## 2020-07-15 MED ORDER — DEXAMETHASONE SODIUM PHOSPHATE 10 MG/ML IJ SOLN
INTRAMUSCULAR | Status: AC
  Filled 2020-07-15: qty 1

## 2020-07-15 MED ORDER — PROPOFOL 10 MG/ML IV BOLUS
INTRAVENOUS | Status: AC
  Filled 2020-07-15: qty 20

## 2020-07-15 MED ORDER — DIPHENHYDRAMINE HCL 12.5 MG/5ML PO ELIX
12.5000 mg | ORAL_SOLUTION | ORAL | Status: DC | PRN
Start: 2020-07-15 — End: 2020-07-15

## 2020-07-15 MED ORDER — BISACODYL 5 MG PO TBEC: 5.0000 mg | DELAYED_RELEASE_TABLET | Freq: Every day | ORAL | Status: DC | PRN

## 2020-07-15 MED ORDER — ROCURONIUM BROMIDE 10 MG/ML (PF) SYRINGE
PREFILLED_SYRINGE | INTRAVENOUS | Status: DC | PRN
  Administered 2020-07-15: 50 mg via INTRAVENOUS

## 2020-07-15 MED ORDER — PHENOL 1.4 % MT LIQD: 1.0000 | OROMUCOSAL | Status: DC | PRN

## 2020-07-15 MED ORDER — MIDAZOLAM HCL 2 MG/2ML IJ SOLN
INTRAMUSCULAR | Status: AC
  Filled 2020-07-15: qty 2

## 2020-07-15 MED ORDER — ACETAMINOPHEN 500 MG PO TABS: 1000.0000 mg | ORAL_TABLET | Freq: Once | ORAL | Status: DC | PRN

## 2020-07-15 MED ORDER — LACTATED RINGERS IV BOLUS
500.0000 mL | Freq: Once | INTRAVENOUS | Status: AC
  Administered 2020-07-15: 500 mL via INTRAVENOUS

## 2020-07-15 MED ORDER — LACTATED RINGERS IV SOLN: INTRAVENOUS | Status: DC

## 2020-07-15 MED ORDER — ORAL CARE MOUTH RINSE: 15.0000 mL | Freq: Once | OROMUCOSAL | Status: AC

## 2020-07-15 MED ORDER — PANTOPRAZOLE SODIUM 40 MG PO TBEC: 40.0000 mg | DELAYED_RELEASE_TABLET | Freq: Every day | ORAL | Status: DC

## 2020-07-15 MED ORDER — PHENYLEPHRINE HCL (PRESSORS) 10 MG/ML IV SOLN
INTRAVENOUS | Status: AC
  Filled 2020-07-15: qty 2

## 2020-07-15 MED ORDER — HYDROMORPHONE HCL 2 MG PO TABS: 2.0000 mg | ORAL_TABLET | ORAL | 0 refills | Status: DC | PRN

## 2020-07-15 MED ORDER — ACETAMINOPHEN 10 MG/ML IV SOLN: 1000.0000 mg | Freq: Once | INTRAVENOUS | Status: DC | PRN

## 2020-07-15 MED ORDER — FENTANYL CITRATE (PF) 100 MCG/2ML IJ SOLN: 25.0000 ug | INTRAMUSCULAR | Status: DC | PRN

## 2020-07-15 MED ORDER — MIDAZOLAM HCL 5 MG/5ML IJ SOLN
INTRAMUSCULAR | Status: DC | PRN
  Administered 2020-07-15: 2 mg via INTRAVENOUS

## 2020-07-15 MED ORDER — PROPOFOL 10 MG/ML IV BOLUS
INTRAVENOUS | Status: DC | PRN
  Administered 2020-07-15: 150 mg via INTRAVENOUS

## 2020-07-15 MED ORDER — BUPIVACAINE HCL (PF) 0.5 % IJ SOLN
INTRAMUSCULAR | Status: DC | PRN
  Administered 2020-07-15: 20 mL via PERINEURAL

## 2020-07-15 MED ORDER — OXYCODONE HCL 5 MG/5ML PO SOLN: 5.0000 mg | Freq: Once | ORAL | Status: DC | PRN

## 2020-07-15 MED ORDER — BUPIVACAINE LIPOSOME 1.3 % IJ SUSP
INTRAMUSCULAR | Status: DC | PRN
  Administered 2020-07-15: 133 mg via PERINEURAL

## 2020-07-15 MED ORDER — ONDANSETRON HCL 4 MG/2ML IJ SOLN: 4.0000 mg | Freq: Four times a day (QID) | INTRAMUSCULAR | Status: DC | PRN

## 2020-07-15 SURGICAL SUPPLY — 77 items
ADH SKN CLS APL DERMABOND .7 (GAUZE/BANDAGES/DRESSINGS) ×1
AID PSTN UNV HD RSTRNT DISP (MISCELLANEOUS) ×1
BAG SPEC THK2 15X12 ZIP CLS (MISCELLANEOUS) ×1
BAG ZIPLOCK 12X15 (MISCELLANEOUS) ×2 IMPLANT
BLADE SAW SGTL 83.5X18.5 (BLADE) ×2 IMPLANT
BSPLAT GLND +2X24 MDLR (Joint) ×1 IMPLANT
COOLER ICEMAN CLASSIC (MISCELLANEOUS) IMPLANT
COVER BACK TABLE 60X90IN (DRAPES) ×2 IMPLANT
COVER SURGICAL LIGHT HANDLE (MISCELLANEOUS) ×2 IMPLANT
COVER WAND RF STERILE (DRAPES) IMPLANT
CUP SUT UNIV REVERS 36 NEUTRAL (Cup) ×1 IMPLANT
DERMABOND ADVANCED (GAUZE/BANDAGES/DRESSINGS) ×1
DERMABOND ADVANCED .7 DNX12 (GAUZE/BANDAGES/DRESSINGS) ×1 IMPLANT
DRAPE INCISE IOBAN 66X45 STRL (DRAPES) IMPLANT
DRAPE ORTHO SPLIT 77X108 STRL (DRAPES) ×4
DRAPE SHEET LG 3/4 BI-LAMINATE (DRAPES) ×2 IMPLANT
DRAPE SURG 17X11 SM STRL (DRAPES) ×2 IMPLANT
DRAPE SURG ORHT 6 SPLT 77X108 (DRAPES) ×2 IMPLANT
DRAPE U-SHAPE 47X51 STRL (DRAPES) ×2 IMPLANT
DRESSING AQUACEL AG SP 3.5X6 (GAUZE/BANDAGES/DRESSINGS) IMPLANT
DRSG AQUACEL AG ADV 3.5X10 (GAUZE/BANDAGES/DRESSINGS) ×1 IMPLANT
DRSG AQUACEL AG SP 3.5X6 (GAUZE/BANDAGES/DRESSINGS) ×2
DURAPREP 26ML APPLICATOR (WOUND CARE) ×2 IMPLANT
ELECT BLADE TIP CTD 4 INCH (ELECTRODE) ×2 IMPLANT
ELECT REM PT RETURN 15FT ADLT (MISCELLANEOUS) ×2 IMPLANT
FACESHIELD WRAPAROUND (MASK) ×8 IMPLANT
FACESHIELD WRAPAROUND OR TEAM (MASK) ×4 IMPLANT
GLENOID UNI REV MOD 24 +2 LAT (Joint) ×1 IMPLANT
GLENOSPHERE 36 +4 LAT/24 (Joint) ×1 IMPLANT
GLOVE BIO SURGEON STRL SZ7.5 (GLOVE) ×2 IMPLANT
GLOVE BIO SURGEON STRL SZ8 (GLOVE) ×2 IMPLANT
GLOVE SS BIOGEL STRL SZ 7 (GLOVE) ×1 IMPLANT
GLOVE SS BIOGEL STRL SZ 7.5 (GLOVE) ×1 IMPLANT
GLOVE SUPERSENSE BIOGEL SZ 7 (GLOVE) ×1
GLOVE SUPERSENSE BIOGEL SZ 7.5 (GLOVE) ×1
GLOVE SURG SYN 7.0 (GLOVE) IMPLANT
GLOVE SURG SYN 7.0 PF PI (GLOVE) IMPLANT
GLOVE SURG SYN 7.5  E (GLOVE)
GLOVE SURG SYN 7.5 E (GLOVE) IMPLANT
GLOVE SURG SYN 7.5 PF PI (GLOVE) IMPLANT
GLOVE SURG SYN 8.0 (GLOVE) IMPLANT
GLOVE SURG SYN 8.0 PF PI (GLOVE) IMPLANT
GOWN STRL REUS W/TWL LRG LVL3 (GOWN DISPOSABLE) ×4 IMPLANT
KIT BASIN OR (CUSTOM PROCEDURE TRAY) ×2 IMPLANT
KIT TURNOVER KIT A (KITS) IMPLANT
LINER HUMERAL 36 +3MM SM (Shoulder) ×1 IMPLANT
MANIFOLD NEPTUNE II (INSTRUMENTS) ×2 IMPLANT
NS IRRIG 1000ML POUR BTL (IV SOLUTION) ×2 IMPLANT
PACK SHOULDER (CUSTOM PROCEDURE TRAY) ×2 IMPLANT
PAD ARMBOARD 7.5X6 YLW CONV (MISCELLANEOUS) ×2 IMPLANT
PAD COLD SHLDR WRAP-ON (PAD) IMPLANT
PIN SET MODULAR GLENOID SYSTEM (PIN) ×1 IMPLANT
RESTRAINT HEAD UNIVERSAL NS (MISCELLANEOUS) ×2 IMPLANT
SCREW CENTRAL MODULAR 25 (Screw) ×1 IMPLANT
SCREW PERI LOCK 5.5X16 (Screw) ×1 IMPLANT
SCREW PERI LOCK 5.5X36 (Screw) ×2 IMPLANT
SCREW PERIPHERAL 5.5X20 LOCK (Screw) ×1 IMPLANT
SLING ARM FOAM STRAP LRG (SOFTGOODS) ×1 IMPLANT
SLING ARM FOAM STRAP MED (SOFTGOODS) IMPLANT
SPACER SHLD UNI REV 36 +6 (Shoulder) ×1 IMPLANT
SPONGE LAP 18X18 RF (DISPOSABLE) IMPLANT
STEM HUMERAL UNIVERS SZ8 (Stem) ×1 IMPLANT
SUCTION FRAZIER HANDLE 12FR (TUBING) ×2
SUCTION TUBE FRAZIER 12FR DISP (TUBING) ×1 IMPLANT
SUT FIBERTAPE CERCLAGE 2 48 (SUTURE) ×1 IMPLANT
SUT FIBERWIRE #2 38 T-5 BLUE (SUTURE) ×2
SUT MNCRL AB 3-0 PS2 18 (SUTURE) ×2 IMPLANT
SUT MON AB 2-0 CT1 36 (SUTURE) ×2 IMPLANT
SUT VIC AB 1 CT1 36 (SUTURE) ×3 IMPLANT
SUTURE FIBERWR #2 38 T-5 BLUE (SUTURE) IMPLANT
SUTURE TAPE 1.3 40 TPR END (SUTURE) ×2 IMPLANT
SUTURE TAPE TIGERLINK 1.3MM BL (SUTURE) IMPLANT
SUTURETAPE 1.3 40 TPR END (SUTURE) ×4
SUTURETAPE TIGERLINK 1.3MM BL (SUTURE) ×2
TOWEL OR 17X26 10 PK STRL BLUE (TOWEL DISPOSABLE) ×2 IMPLANT
TOWEL OR NON WOVEN STRL DISP B (DISPOSABLE) ×2 IMPLANT
WATER STERILE IRR 1000ML POUR (IV SOLUTION) ×4 IMPLANT

## 2020-07-15 NOTE — Anesthesia Procedure Notes (Signed)
Anesthesia Regional Block: Interscalene brachial plexus block   Pre-Anesthetic Checklist: ,, timeout performed, Correct Patient, Correct Site, Correct Laterality, Correct Procedure, Correct Position, site marked, Risks and benefits discussed,  Surgical consent,  Pre-op evaluation,  At surgeon's request and post-op pain management  Laterality: Right and Upper  Prep: chloraprep       Needles:  Injection technique: Single-shot     Needle Length: 9cm  Needle Gauge: 22     Additional Needles: Arrow StimuQuik ECHO Echogenic Stimulating PNB Needle  Procedures:,,,, ultrasound used (permanent image in chart),,,,  Narrative:  Start time: 07/15/2020 7:12 AM End time: 07/15/2020 7:18 AM Injection made incrementally with aspirations every 5 mL.  Performed by: Personally  Anesthesiologist: Oleta Mouse, MD

## 2020-07-15 NOTE — Telephone Encounter (Signed)
Stacy Ayers  [phone]  Patient is been admitting today. They need paperwork asap.

## 2020-07-15 NOTE — H&P (Signed)
Stacy Ayers    Chief Complaint: Right shoulder rotator cuff tear arthropathy HPI: The patient is a 79 y.o. female with chronic and progressively increasing right shoulder pain related to advanced rotator cuff tear arthropathy.  Due to her increasing pain and failure to respond to conservative management she is brought to the operating this time for planned right shoulder reverse arthroplasty  Past Medical History:  Diagnosis Date  . Abnormal liver function tests   . Allergy   . Anxiety   . Arthritis    knees, lower back   . Asthma   . Cataract    left eye growing cataract   . Depression   . Diabetes mellitus    had gastric bypass DM resolved-   . Diverticulosis   . GERD (gastroesophageal reflux disease)   . Headache    Previous migraines  . Heart murmur   . History of colon polyps    adenomatous  . Hyperlipidemia    past hx- resloved after gastric bypass   . Hypertension   . Hypothyroidism   . IBS (irritable bowel syndrome)   . Iron deficiency anemia   . Osteoporosis     Past Surgical History:  Procedure Laterality Date  . ABDOMINAL HYSTERECTOMY    . ABDOMINAL HYSTERECTOMY    . APPENDECTOMY    . BLADDER REPAIR    . BREAST BIOPSY Right    needle core biopsy, benign  . CATARACT EXTRACTION Left   . CHOLECYSTECTOMY    . COLONOSCOPY    . CORONARY ANGIOPLASTY    . GASTRIC BYPASS    . POLYPECTOMY    . TONSILLECTOMY AND ADENOIDECTOMY      Family History  Problem Relation Age of Onset  . Diabetes Sister   . Heart disease Maternal Grandmother   . Angina Maternal Grandmother   . Stroke Mother   . Suicidality Maternal Grandfather   . Cancer Paternal Grandmother   . Obesity Father   . Colon cancer Neg Hx   . Colon polyps Neg Hx   . Esophageal cancer Neg Hx   . Rectal cancer Neg Hx   . Stomach cancer Neg Hx   . Pancreatic cancer Neg Hx   . Liver disease Neg Hx     Social History:  reports that she quit smoking about 58 years ago. Her smoking use included  cigarettes. She has a 0.60 pack-year smoking history. She has never used smokeless tobacco. She reports previous alcohol use. She reports that she does not use drugs.   Facility-Administered Medications Prior to Admission  Medication Dose Route Frequency Provider Last Rate Last Admin  . 0.9 %  sodium chloride infusion  500 mL Intravenous Once Irene Shipper, MD       Medications Prior to Admission  Medication Sig Dispense Refill  . acetaminophen (TYLENOL) 650 MG CR tablet Take 650 mg by mouth every 8 (eight) hours as needed for pain.    . cholecalciferol (VITAMIN D) 1000 UNITS tablet Take 1,000 Units by mouth daily.      Marland Kitchen levothyroxine (SYNTHROID) 137 MCG tablet Take 1 tablet (137 mcg total) by mouth daily before breakfast. Take 1 tablet (137 mcg total) by mouth M,W,F. Take 150 mcg all other days. (Patient taking differently: Take 137 mcg by mouth See admin instructions. Sunday Tues Thurs Sat - alternates with 150 mcg) 30 tablet 5  . levothyroxine (SYNTHROID) 150 MCG tablet Take 1 tablet (150 mcg total) by mouth daily. (Patient taking differently: Take 150 mcg  by mouth every Monday, Wednesday, and Friday. ) 30 tablet 2  . loperamide (IMODIUM) 2 MG capsule Take 2 mg by mouth as needed for diarrhea or loose stools.     Marland Kitchen loratadine (CLARITIN) 10 MG tablet TAKE 1 TABLET ONCE DAILY. (Patient taking differently: Take 10 mg by mouth daily. ) 30 tablet 4  . losartan (COZAAR) 50 MG tablet TAKE 1 TABLET ONCE DAILY. (Patient taking differently: Take 50 mg by mouth daily. ) 30 tablet 2  . metoprolol tartrate (LOPRESSOR) 25 MG tablet Take 1-2 tablets by mouth as needed for palpitations. (Patient taking differently: Take 25-50 mg by mouth See admin instructions. Take 1-2 tablets by mouth as needed for palpitations.) 30 tablet 3  . pantoprazole (PROTONIX) 40 MG tablet TAKE 1 TABLET ONCE DAILY. (Patient taking differently: Take 40 mg by mouth daily. ) 90 tablet 0  . Probiotic Product (PROBIOTIC DAILY) CAPS Take 1  capsule by mouth daily.    . traMADol (ULTRAM) 50 MG tablet Take 50 mg by mouth every 6 (six) hours as needed (pain).     Marland Kitchen venlafaxine XR (EFFEXOR XR) 75 MG 24 hr capsule Take 1 capsule (75 mg total) by mouth daily with breakfast. 90 capsule 3  . vitamin B-12 (CYANOCOBALAMIN) 1000 MCG tablet Take 1,000 mcg by mouth daily.    Marland Kitchen denosumab (PROLIA) 60 MG/ML SOSY injection Inject 60 mg into the skin every 6 (six) months.    . fluticasone (FLONASE) 50 MCG/ACT nasal spray USE 2 SPRAYS IN EACH NOSTRIL ONCE DAILY (Patient taking differently: Place 2 sprays into both nostrils daily. ) 16 g 5  . PROAIR HFA 108 (90 Base) MCG/ACT inhaler USE 2 PUFFS EVERY 6 HOURS AS NEEDED. (Patient not taking: Reported on 07/03/2020) 8.5 g 0  . SYMBICORT 160-4.5 MCG/ACT inhaler USE 2 PUFFS TWICE A DAY. (Patient not taking: Reported on 07/03/2020) 10.2 g 0     Physical Exam: Right shoulder demonstrates painful and guarded motion as noted at recent office visits.  Profound weakness.  Diagnostic imaging confirmed changes consistent with chronic rotator cuff tear arthropathy.  Vitals  Weight:  [78.7 kg] 78.7 kg (08/17 0610)  Assessment/Plan  Impression: Right shoulder rotator cuff tear arthropathy  Plan of Action: Procedure(s): REVERSE SHOULDER ARTHROPLASTY  Venera Privott M Elisavet Buehrer 07/15/2020, 6:44 AM Contact # [phone]

## 2020-07-15 NOTE — Transfer of Care (Signed)
Immediate Anesthesia Transfer of Care Note  Patient: Stacy Ayers  Procedure(s) Performed: REVERSE SHOULDER ARTHROPLASTY (Right Shoulder)  Patient Location: PACU  Anesthesia Type:General  Level of Consciousness: awake, alert , oriented and patient cooperative  Airway & Oxygen Therapy: Patient Spontanous Breathing and Patient connected to face mask oxygen  Post-op Assessment: Report given to RN, Post -op Vital signs reviewed and stable and Patient moving all extremities  Post vital signs: Reviewed and stable  Last Vitals:  Vitals Value Taken Time  BP 152/67 07/15/20 0949  Temp    Pulse 88 07/15/20 0951  Resp 13 07/15/20 0951  SpO2 100 % 07/15/20 0951  Vitals shown include unvalidated device data.  Last Pain:  Vitals:   07/15/20 0645  TempSrc: Oral  PainSc:       Patients Stated Pain Goal: 4 (78/24/23 5361)  Complications: No complications documented.

## 2020-07-15 NOTE — Progress Notes (Signed)
Occupational Therapy Evaluation  s/p shoulder replacement without functional use of right dominant upper extremity secondary to effects of surgery and interscalene block and shoulder precautions. Therapist provided education and instruction to patient and DTR in regards to exercises, precautions, positioning, donning upper extremity clothing and bathing while maintaining shoulder precautions, ice and edema management and donning/doffing sling. Patient and DTR verbalized understanding and demonstrated as needed. Patient needed assistance to donn shirt, underwear, pants, socks and shoes and provided with instruction on compensatory strategies to perform ADLs. Patient reports already scheduled to go to rehab within [facility] at discharge.    07/15/20 1200  OT Visit Information  Last OT Received On 07/15/20  Assistance Needed +1  History of Present Illness Patient is [name]. Patient medical history includes but not limited to DM, abnormal liver functions test, HTN, osteoporosis.    Precautions  Precautions Shoulder  Type of Shoulder Precautions AROM elbow, wrist, hand ok, Pendulums and lap slides ok, P/AA/AROM shoulder for ADLs FF 60, ABD 45, ER 20  Shoulder Interventions Shoulder sling/immobilizer;Off for dressing/bathing/exercises (off in controlled environments)  Precaution Booklet Issued Yes (comment)  Required Braces or Orthoses Sling  Restrictions  Weight Bearing Restrictions Yes  RUE Weight Bearing NWB  Home Living  Family/patient expects to be discharged to: Skilled nursing facility  Additional Comments Patient lives in [place] living at [facility], but is discharging to there rehab   Prior Function  Level of Independence Independent  Communication  Communication No difficulties  Pain Assessment  Pain Assessment No/denies pain  Cognition  Arousal/Alertness Awake/alert  Behavior During Therapy WFL for tasks assessed/performed   Overall Cognitive Status Within Functional Limits for tasks assessed  Upper Extremity Assessment  Upper Extremity Assessment RUE deficits/detail  RUE Deficits / Details nerve block present  Lower Extremity Assessment  Lower Extremity Assessment Overall WFL for tasks assessed  Cervical / Trunk Assessment  Cervical / Trunk Assessment Normal  ADL  Overall ADL's  Needs assistance/impaired  Eating/Feeding Set up;Sitting  Grooming Set up;Sitting  Upper Body Bathing Minimal assistance;Sitting  Lower Body Bathing Minimal assistance;Sit to/from stand  Upper Body Dressing  Minimal assistance;Sitting;Cueing for sequencing;Cueing for compensatory techniques;Adhering to UE precautions  Upper Body Dressing Details (indicate cue type and reason) assist guiding R UE into button up due to nerve block  Lower Body Dressing Minimal assistance;Sit to/from stand  Lower Body Dressing Details (indicate cue type and reason) min A to pull clothes up over R hip  Toilet Transfer Supervision/safety  Toilet Transfer Details (indicate cue type and reason) to stand from [place] and Hygiene Minimal assistance;Sitting/lateral lean;Sit to/from stand  Functional mobility during ADLs Supervision/safety  General ADL Comments educated patient and DTR in post op shoulder protocol, compensatory strategies and prescribed exercises.   Bed Mobility  General bed mobility comments in recliner  Transfers  Overall transfer level Needs assistance  Equipment used None  Transfers Sit to/from Stand  Sit to Stand Supervision  General transfer comment for safety  Balance  Overall balance assessment Mild deficits observed, not formally tested  Exercises  Exercises Shoulder  Shoulder Instructions  Donning/doffing shirt without moving shoulder Minimal assistance;Patient able to independently direct caregiver  Method for sponge bathing under operated UE Minimal assistance;Patient able to  independently direct caregiver  Donning/doffing sling/immobilizer Maximal assistance;Patient able to independently direct caregiver  Correct positioning of sling/immobilizer Minimal assistance;Patient able to independently direct caregiver  Pendulum exercises (written home exercise program) Patient  able to independently direct caregiver  ROM for elbow, wrist and digits of operated UE Patient able to independently direct caregiver  Sling wearing schedule (on at all times/off for ADL's) Patient able to independently direct caregiver  Proper positioning of operated UE when showering Patient able to independently direct caregiver  Positioning of UE while sleeping Patient able to independently direct caregiver  OT - End of Session  Equipment Utilized During Treatment Other (comment) (sling)  Activity Tolerance Patient tolerated treatment well  Patient left in chair;with call bell/phone within reach;with family/visitor present;with nursing/sitter in room  Nurse Communication Mobility status  OT Assessment  OT Recommendation/Assessment Progress rehab of shoulder as ordered by MD at follow-up appointment  OT Visit Diagnosis Pain  Pain - Right/Left Right  Pain - part of body Shoulder  OT Problem List Impaired UE functional use;Pain  AM-PAC OT "6 Clicks" Daily Activity Outcome Measure (Version 2)  Help from another person eating meals? 3  Help from another person taking care of personal grooming? 3  Help from another person toileting, which includes using toliet, bedpan, or urinal? 3  Help from another person bathing (including washing, rinsing, drying)? 3  Help from another person to put on and taking off regular upper body clothing? 3  Help from another person to put on and taking off regular lower body clothing? 3  6 Click Score 18  OT Recommendation  Follow Up Recommendations Follow surgeons recommendation for DC plan and follow-up therapies;Other (comment) (patient reports going to SNF)   OT Equipment None recommended by OT  Acute Rehab OT Goals  Patient Stated Goal go to rehab  OT Goal Formulation With patient  OT Time Calculation  OT Start Time (ACUTE ONLY) 1204  OT Stop Time (ACUTE ONLY) 1237  OT Time Calculation (min) 33 min  OT General Charges  $OT Visit 1 Visit  OT Evaluation  $OT Eval Low Complexity 1 Low  OT Treatments  $Self Care/Home Management  8-22 mins  Written Expression  Dominant Hand Right   Stacy Ayers OT OT pager: [phone]

## 2020-07-15 NOTE — Telephone Encounter (Signed)
Sent ppwk received confirmation.

## 2020-07-15 NOTE — Op Note (Signed)
07/15/2020  9:50 AM  PATIENT:   Stacy Ayers  79 y.o. female  PRE-OPERATIVE DIAGNOSIS:  Right shoulder rotator cuff tear arthropathy  POST-OPERATIVE DIAGNOSIS: Same  PROCEDURE: Right shoulder reverse arthroplasty utilizing a press-fit size 8 Arthrex stem with a +6 spacer, +3 polyethylene insert, 36/+4 glenosphere on a small/+2 baseplate  SURGEON:  Virda Betters, Metta Clines M.D.  ASSISTANTS: Jenetta Loges, PA-C  ANESTHESIA:   General endotracheal and interscalene block with Exparel  EBL: 150 cc  SPECIMEN: None  Drains: None   PATIENT DISPOSITION:  PACU - hemodynamically stable.    PLAN OF CARE: Discharge to home after PACU  Brief history:  Stacy Ayers is a 79 year old female who has been followed for chronic and progressive increasing right shoulder pain related to advanced rotator cuff tear arthropathy.  Due to her increasing pain and functional rotations and failure to respond to prolonged attempts at conservative management she is brought to the operating room this time for planned right shoulder reverse arthroplasty.  Preoperatively, I counseled the patient regarding treatment options and risks versus benefits thereof.  Possible surgical complications were all reviewed including potential for bleeding, infection, neurovascular injury, persistent pain, loss of motion, anesthetic complication, failure of the implant, and possible need for additional surgery. They understand and accept and agrees with our planned procedure.   Procedure in detail:  After undergoing routine preop evaluation patient received prophylactic antibiotics and interscalene block with Exparel was established in the holding area by the anesthesia department.  Patient subsequently placed supine on the operating table and underwent the smooth induction of a general endotracheal anesthesia.  Patient subsequently placed into the beachchair position and appropriate padding protected.  The right shoulder region was  sterilely prepped and draped in standard fashion.  Timeout was called.  An anterior deltopectoral approach was made to an approximately 6 cm incision.  Skin flaps were elevated dissection carried deeply with the deltopectoral interval developed from proximal to distal with the vein taken laterally.  We did identify an area where the vein was attenuated and compromised and did perform a ligation of the vein distally.  The deltopectoral interval was then developed and the conjoined tendon was mobilized and retracted medially in the upper centimeter of the pectoralis major tendon was divided for enhanced exposure.  The remnant of the long head biceps tendon was then tenodesed at the upper border of the pectoralis major although it is unclear whether there was any significant proximal segment due to her chronic rotator cuff disease.  The subscapularis was then divided from the insertion into the lesser tuberosity and was tagged with suture tape sutures and then capsular attachments were divided from the anterior infra margins of the humeral head allowing deliver the humeral head through the wound.  We outlined our proposed humeral head resection with the extra medullary guide and the oscillating saw was then used to perform a humeral head osteotomy at the native retroversion of approximate 30 degrees.  A metal cap was then placed over the cut proximal humeral surface we then exposed the glenoid using appropriate retractors.  A circumferential labral resection was then completed.  A guidepin was then directed into the center of the glenoid with an approximately 5 degree inferior tilt.  The glenoid was then reamed with our central followed by the peripheral reamer and all debris was removed and care was taken to confirm a proper subchondral bony bed.  Glenoid was then prepared with the central drill followed by the tap and a  25 mm lag screw was selected.  The baseplate was then inserted with excellent purchase.  All the  peripheral locking screws then placed with excellent fit and fixation.  A 36/+4 glenosphere was then impacted onto the baseplate and the central locking screw was placed.  We then returned our attention back to the proximal humerus where the canal was prepared hand reaming to size 7 and ultimately broaching to a size 8 and then a metaphyseal reamer was used in the neutral alignment and the metaphysis was prepared.  This point trial reductions were then performed confirming good soft tissue balance good motion good stability.  At this point our final implant was then assembled.  During terminal seeding of the final implant we did noticed the development of a unicortical fracture through the humeral metaphysis.  With this finding we removed the stem and we passed a fiber tape cerclage around the humeral metaphysis to prevent propagation of this metaphyseal split.  This was appropriate placed and tensioned.  We then harvested abundant bone graft from the resected humeral head and this was then placed liberally in the metaphyseal region as our final stem was then being inserted to provide additional support in the metaphyseal region.  The implant was then inserted provisionally but not completely and then an additional FiberWire was passed through the superior eyelid on the stem and then passed medially through the region of the humeral neck to provide additional support of the metaphysis and this was then tensioned using a luggage tag technique to provide additional support on the metaphyseal region and prevent any extension of the small unicortical metaphyseal fracture.  Once this had been appropriately tensioned we then terminally seated the implant with excellent purchase and fixation and good stability.  The cerclage was were then terminally tightened.  We then performed a series of trial reductions and ultimately felt that +9 off our stem gave [place] the best soft tissue balance.  At this point the trials were  removed.  A +6 spacer and then a +3 polywas inserted.  Final reduction was performed which showed good soft tissue balance good motion good stability.  The wounds and copes irrigated.  Hemostasis was obtained.  The subscapularis was evaluated showing good elasticity and was then repaired back to the eyelets in the collar of our stem.  Upon completion the arm easily achieved already 5 degrees of external rotation without excessive tension on the subscap.  The deltopectoral interval was then reapproximated with a series of figure-of-eight #1 Vicryl sutures.  2-0 Vicryl used for subcu layer and intracuticular 3 Monocryl for the skin followed by Dermabond and Aquacel dressing.  Right arm was placed into a sling and the patient was awakened, extubated, and taken to the recovery room in stable condition.  Jenetta Loges, PA-C was utilized as an Environmental consultant throughout this case, essential for help with positioning the patient, positioning extremity, tissue manipulation, implantation of the prosthesis, suture management, wound closure, and intraoperative decision-making.  Marin Shutter MD   Contact # [phone]

## 2020-07-15 NOTE — Anesthesia Procedure Notes (Signed)
Procedure Name: Intubation Date/Time: 07/15/2020 7:43 AM Performed by: Victoriano Lain, CRNA Pre-anesthesia Checklist: Patient identified, Emergency Drugs available, Suction available, Patient being monitored and Timeout performed Patient Re-evaluated:Patient Re-evaluated prior to induction Oxygen Delivery Method: Circle system utilized Preoxygenation: Pre-oxygenation with 100% oxygen Induction Type: IV induction Ventilation: Mask ventilation without difficulty Laryngoscope Size: Mac and 4 Grade View: Grade I Tube type: Oral Tube size: 7.5 mm Number of attempts: 1 Airway Equipment and Method: Stylet Placement Confirmation: ETT inserted through vocal cords under direct vision,  positive ETCO2 and breath sounds checked- equal and bilateral Secured at: 21 cm Tube secured with: Tape Dental Injury: Teeth and Oropharynx as per pre-operative assessment

## 2020-07-15 NOTE — Discharge Instructions (Signed)
General Anesthesia, Adult, Care After This sheet gives you information about how to care for yourself after your procedure. Your health care provider may also give you more specific instructions. If you have problems or questions, contact your health care provider. What can I expect after the procedure? After the procedure, the following side effects are common:  Pain or discomfort at the IV site.  Nausea.  Vomiting.  Sore throat.  Trouble concentrating.  Feeling cold or chills.  Weak or tired.  Sleepiness and fatigue.  Soreness and body aches. These side effects can affect parts of the body that were not involved in surgery. Follow these instructions at home: For at least 24 hours after the procedure:  Have a responsible adult stay with you. It is important to have someone help care for you until you are awake and alert.  Rest as needed.  Do not: ? Participate in activities in which you could fall or become injured. ? Drive. ? Use heavy machinery. ? Drink alcohol. ? Take sleeping pills or medicines that cause drowsiness. ? Make important decisions or sign legal documents. ? Take care of children on your own. Eating and drinking  Follow any instructions from your health care provider about eating or drinking restrictions.  When you feel hungry, start by eating small amounts of foods that are soft and easy to digest (bland), such as toast. Gradually return to your regular diet.  Drink enough fluid to keep your urine pale yellow.  If you vomit, rehydrate by drinking water, juice, or clear broth. General instructions  If you have sleep apnea, surgery and certain medicines can increase your risk for breathing problems. Follow instructions from your health care provider about wearing your sleep device: ? Anytime you are sleeping, including during daytime naps. ? While taking prescription pain medicines, sleeping medicines, or medicines that make you drowsy.  Return to  your normal activities as told by your health care provider. Ask your health care provider what activities are safe for you.  Take over-the-counter and prescription medicines only as told by your health care provider.  If you smoke, do not smoke without supervision.  Keep all follow-up visits as told by your health care provider. This is important. Contact a health care provider if:  You have nausea or vomiting that does not get better with medicine.  You cannot eat or drink without vomiting.  You have pain that does not get better with medicine.  You are unable to pass urine.  You develop a skin rash.  You have a fever.  You have redness around your IV site that gets worse. Get help right away if:  You have difficulty breathing.  You have chest pain.  You have blood in your urine or stool, or you vomit blood. Summary  After the procedure, it is common to have a sore throat or nausea. It is also common to feel tired.  Have a responsible adult stay with you for the first 24 hours after general anesthesia. It is important to have someone help care for you until you are awake and alert.  When you feel hungry, start by eating small amounts of foods that are soft and easy to digest (bland), such as toast. Gradually return to your regular diet.  Drink enough fluid to keep your urine pale yellow.  Return to your normal activities as told by your health care provider. Ask your health care provider what activities are safe for you. This information is not intended  to replace advice given to you by your health care provider. Make sure you discuss any questions you have with your health care provider. Document Revised: 11/18/2017 Document Reviewed: 07/01/2017 Elsevier Patient Education  2020 [place], M.D., F.A.A.O.S. [facility] Specializing in Arthroscopic and Reconstructive Surgery of the Shoulder [phone] [address] [place], [place] [address] - Fax [phone]   POST-OP TOTAL SHOULDER REPLACEMENT INSTRUCTIONS  1. Follow up in the office for your first post-op appointment 10-14 days from the date of your surgery. If you do not already have a scheduled appointment, our office will contact you to schedule.  2. The bandage over your incision is waterproof. You may begin showering with this dressing on. You may leave this dressing on until first follow up appointment within 2 weeks. We prefer you leave this dressing in place until follow up however after 5-7 days if you are having itching or skin irritation and would like to remove it you may do so. Go slow and tug at the borders gently to break the bond the dressing has with the skin. At this point if there is no drainage it is okay to go without a bandage or you may cover it with a light guaze and tape. You can also expect significant bruising around your shoulder that will drift down your arm and into your chest wall. This is very normal and should resolve over several days.   3. Wear your sling/immobilizer at all times except to perform the exercises below or to occasionally let your arm dangle by your side to stretch your elbow. You also need to sleep in your sling immobilizer until instructed otherwise. It is ok to remove your sling if you are sitting in a controlled environment and allow your arm to rest in a position of comfort by your side or on your lap with pillows to give your neck and skin a break from the sling. You may remove it to allow arm to dangle by side to shower. If you are up walking around and when you go to sleep at night you need to wear it.  4. Range of motion to your elbow, wrist, and hand are encouraged 3-5 times daily. Exercise to your hand and fingers helps to reduce swelling you may experience.   5. Prescriptions for a pain medication and a muscle relaxant are provided for you. It is recommended that if you are experiencing pain that you pain  medication alone is not controlling, add the muscle relaxant along with the pain medication which can give additional pain relief. The first 1-2 days is generally the most severe of your pain and then should gradually decrease. As your pain lessens it is recommended that you decrease your use of the pain medications to an "as needed basis'" only and to always comply with the recommended dosages of the pain medications.  6. Pain medications can produce constipation along with their use. If you experience this, the use of an over the counter stool softener or laxative daily is recommended.   7. For additional questions or concerns, please do not hesitate to call the office. If after hours there is an answering service to forward your concerns to the physician on call.  8.Pain control following an exparel block  To help control your post-operative pain you received a nerve block  performed with Exparel which is a long acting anesthetic (numbing agent) which can provide pain relief and sensations  of numbness (and relief of pain) in the operative shoulder and arm for up to 3 days. Sometimes it provides mixed relief, meaning you may still have numbness in certain areas of the arm but can still be able to move  parts of that arm, hand, and fingers. We recommend that your prescribed pain medications  be used as needed. We do not feel it is necessary to "pre medicate" and "stay ahead" of pain.  Taking narcotic pain medications when you are not having any pain can lead to unnecessary and potentially dangerous side effects.    9. Use the ice machine as much as possible in the first 5-7 days from surgery, then you can wean its use to as needed. The ice typically needs to be replaced every 6 hours, instead of ice you can actually freeze water bottles to put in the cooler and then fill water around them to avoid having to purchase ice. You can have spare water bottles freezing to allow you to rotate them once they have  melted. Try to have a thin shirt or light cloth or towel under the ice wrap to protect your skin.   10.  We recommend that you avoid any dental work or cleaning in the first 3 months following your joint replacement. This is to help minimize the possibility of infection from the bacteria in your mouth that enters your bloodstream during dental work. We also recommend that you take an antibiotic prior to your dental work for the first year after your shoulder replacement to further help reduce that risk. Please simply contact our office for antibiotics to be sent to your pharmacy prior to dental work.  11. Dental Antibiotics:  In most cases prophylactic antibiotics for Dental procdeures after total joint surgery are not necessary.  Exceptions are as follows:  1. History of prior total joint infection  2. Severely immunocompromised (Organ Transplant, cancer chemotherapy, Rheumatoid biologic meds such as [place])  3. Poorly controlled diabetes (A1C &gt; 8.0, blood glucose over 200)  If you have one of these conditions, contact your surgeon for an antibiotic prescription, prior to your dental procedure.   POST-OP EXERCISES  Pendulum Exercises  Perform pendulum exercises while standing and bending at the waist. Support your uninvolved arm on a table or chair and allow your operated arm to hang freely. Make sure to do these exercises passively - not using you shoulder muscles. These exercises can be performed once your nerve block effects have worn off.  Repeat 20 times. Do 3 sessions per day.

## 2020-07-16 ENCOUNTER — Encounter: Payer: Self-pay | Admitting: Internal Medicine

## 2020-07-16 ENCOUNTER — Non-Acute Institutional Stay (SKILLED_NURSING_FACILITY): Payer: Self-pay | Admitting: Internal Medicine

## 2020-07-16 DIAGNOSIS — Z9884 Bariatric surgery status: Secondary | ICD-10-CM

## 2020-07-16 DIAGNOSIS — D508 Other iron deficiency anemias: Secondary | ICD-10-CM

## 2020-07-16 DIAGNOSIS — Z9189 Other specified personal risk factors, not elsewhere classified: Secondary | ICD-10-CM | POA: Diagnosis not present

## 2020-07-16 DIAGNOSIS — E039 Hypothyroidism, unspecified: Secondary | ICD-10-CM

## 2020-07-16 DIAGNOSIS — I10 Essential (primary) hypertension: Secondary | ICD-10-CM

## 2020-07-16 DIAGNOSIS — Z96611 Presence of right artificial shoulder joint: Secondary | ICD-10-CM

## 2020-07-16 DIAGNOSIS — R0989 Other specified symptoms and signs involving the circulatory and respiratory systems: Secondary | ICD-10-CM

## 2020-07-16 DIAGNOSIS — F418 Other specified anxiety disorders: Secondary | ICD-10-CM

## 2020-07-16 DIAGNOSIS — E538 Deficiency of other specified B group vitamins: Secondary | ICD-10-CM

## 2020-07-16 DIAGNOSIS — R0602 Shortness of breath: Secondary | ICD-10-CM | POA: Diagnosis not present

## 2020-07-16 DIAGNOSIS — E785 Hyperlipidemia, unspecified: Secondary | ICD-10-CM

## 2020-07-16 DIAGNOSIS — R509 Fever, unspecified: Secondary | ICD-10-CM | POA: Diagnosis not present

## 2020-07-16 NOTE — Progress Notes (Signed)
Provider:  Rexene Edison. Mariea Clonts, D.O., C.M.D. Location:  [place] Room Number: 124 Place of Service:  SNF (31)  PCP: [name], DO Patient Care Team: [name], DO as PCP - Gaston Islam, MD as Consulting Physician (Orthopedic Surgery) Marica Otter, [place] as Consulting Physician (Optometry) Sharyn Lull Motting[place] as Consulting Physician (Dentistry) Marica Otter, OD [facility])  Extended Emergency Contact Information Primary Emergency Contact: [facility] Address: [address]          [place], [place] [address] Johnnette Litter of [place] Phone: [phone] Relation: Daughter  Code Status: FULL CODE Goals of Care: Advanced Directive information Advanced Directives 07/16/2020  Does Patient Have a Medical Advance Directive? -  Type of Advance Directive Living will  Does patient want to make changes to medical advance directive? -  Copy of [place] in Chart? Yes - validated most recent copy scanned in chart (See row information)  Would patient like information on creating a medical advance directive? -      Chief Complaint  Patient presents with  . New Admit To SNF    New admit to rehab for shoulder     HPI: Patient is a 79 y.o. female pt of Dr. Carollee Herter with h/o hypothyroidism, vaginitis, GERD, depression, hyperglycemia, hyperlipidemia seen today for admission to [facility] s/p reverse arthroplasty of right shoulder by Dr. Justice Britain for rotator cuff arthropathy--full thickness tear, glenoid labrum tear.    When seen, she admitted to some increasing pain in her axilla and into her upper chest wall.  She was using her ice machine.    Nursing was concerned that they could not hear breath sounds (sounded consolidated) on the right and she'd had a low grade temp of 99.9 oral.  She'd had a nerve block on the right.  Pt also admitted to some difficulty breathing earlier today that "went  away".  She's not coughing.  She had also been intubated and received general anesthesia.  No erythema, warmth or drainage at incision site.    She is on hydromorphone for severe pain, but currently using the tramadol for her discomfort as the nerve block is wearing off.  Her chart indicates some codeine allergy.  She shares that she had bariatric surgery in the past.    Past Medical History:  Diagnosis Date  . Abnormal liver function tests   . Allergy   . Anxiety   . Arthritis    knees, lower back   . Asthma   . Cataract    left eye growing cataract   . Depression   . Diabetes mellitus    had gastric bypass DM resolved-   . Diverticulosis   . GERD (gastroesophageal reflux disease)   . Headache    Previous migraines  . Heart murmur   . History of colon polyps    adenomatous  . Hyperlipidemia    past hx- resloved after gastric bypass   . Hypertension   . Hypothyroidism   . IBS (irritable bowel syndrome)   . Iron deficiency anemia   . Osteoporosis    Past Surgical History:  Procedure Laterality Date  . ABDOMINAL HYSTERECTOMY    . ABDOMINAL HYSTERECTOMY    . APPENDECTOMY    . BLADDER REPAIR    . BREAST BIOPSY Right    needle core biopsy, benign  . CATARACT EXTRACTION Left   . CHOLECYSTECTOMY    . COLONOSCOPY    . CORONARY ANGIOPLASTY    .  GASTRIC BYPASS    . POLYPECTOMY    . TONSILLECTOMY AND ADENOIDECTOMY      Social History   Socioeconomic History  . Marital status: Single    Spouse name: Not on file  . Number of children: 1  . Years of education: Not on file  . Highest education level: Not on file  Occupational History  . Occupation: retired Product manager: RETIRED  Tobacco Use  . Smoking status: Former Smoker    Packs/day: 0.30    Years: 2.00    Pack years: 0.60    Types: Cigarettes    Quit date: 07/11/1962    Years since quitting: 58.0  . Smokeless tobacco: Never Used  Vaping Use  . Vaping Use: Never used  Substance and Sexual Activity   . Alcohol use: Not Currently    Comment: None in 7 weeks  . Drug use: No  . Sexual activity: Yes    Birth control/protection: Surgical    Comment: Hysterectomy  Other Topics Concern  . Not on file  Social History Narrative   Daily caffeine   Social Determinants of Health   Financial Resource Strain: Low Risk   . Difficulty of Paying Living Expenses: Not hard at all  Food Insecurity: No Food Insecurity  . Worried About Charity fundraiser in the Last Year: Never true  . Ran Out of Food in the Last Year: Never true  Transportation Needs: No Transportation Needs  . Lack of Transportation (Medical): No  . Lack of Transportation (Non-Medical): No  Physical Activity:   . Days of Exercise per Week: Not on file  . Minutes of Exercise per Session: Not on file  Stress:   . Feeling of Stress : Not on file  Social Connections:   . Frequency of Communication with Friends and Family: Not on file  . Frequency of Social Gatherings with Friends and Family: Not on file  . Attends Religious Services: Not on file  . Active Member of Clubs or Organizations: Not on file  . Attends Archivist Meetings: Not on file  . Marital Status: Not on file    reports that she quit smoking about 58 years ago. Her smoking use included cigarettes. She has a 0.60 pack-year smoking history. She has never used smokeless tobacco. She reports previous alcohol use. She reports that she does not use drugs.  Functional Status Survey:    Family History  Problem Relation Age of Onset  . Diabetes Sister   . Heart disease Maternal Grandmother   . Angina Maternal Grandmother   . Stroke Mother   . Suicidality Maternal Grandfather   . Cancer Paternal Grandmother   . Obesity Father   . Colon cancer Neg Hx   . Colon polyps Neg Hx   . Esophageal cancer Neg Hx   . Rectal cancer Neg Hx   . Stomach cancer Neg Hx   . Pancreatic cancer Neg Hx   . Liver disease Neg Hx     Health Maintenance  Topic Date Due   . Hepatitis C Screening  Never done  . OPHTHALMOLOGY EXAM  07/21/2018  . FOOT EXAM  08/05/2018  . INFLUENZA VACCINE  06/29/2020  . HEMOGLOBIN A1C  01/09/2021  . TETANUS/TDAP  07/11/2021  . DEXA SCAN  Completed  . COVID-19 Vaccine  Completed  . PNA vac Low Risk Adult  Completed    Allergies  Allergen Reactions  . Sulfonamide Derivatives Diarrhea  . Cocaine  Other reaction(s): Hallucination Sinus irrigation  . Montelukast Sodium     UNKNOWN  . Penicillins     Local skin reaction   . Pioglitazone     UNKNOWN   . Rosiglitazone Maleate     UNKNOWN  . Secobarbital Sodium     UNKNOWN  . Codeine Rash  . Doxycycline Nausea Only    REACTION: GI UPSET    Outpatient Encounter Medications as of 07/16/2020  Medication Sig  . acetaminophen (TYLENOL) 650 MG CR tablet Take 650 mg by mouth every 8 (eight) hours as needed for pain.  . cholecalciferol (VITAMIN D) 1000 UNITS tablet Take 1,000 Units by mouth daily.    . cyclobenzaprine (FLEXERIL) 10 MG tablet Take 1 tablet (10 mg total) by mouth 3 (three) times daily as needed for muscle spasms.  . fluticasone (FLONASE) 50 MCG/ACT nasal spray USE 2 SPRAYS IN EACH NOSTRIL ONCE DAILY  . HYDROmorphone (DILAUDID) 2 MG tablet Take 1 tablet (2 mg total) by mouth every 4 (four) hours as needed for severe pain (for post op pain not controlled by tramadol).  Marland Kitchen levothyroxine (SYNTHROID) 137 MCG tablet Take 1 tablet (137 mcg total) by mouth daily before breakfast. Take 1 tablet (137 mcg total) by mouth M,W,F. Take 150 mcg all other days.  Marland Kitchen levothyroxine (SYNTHROID) 150 MCG tablet Take 1 tablet (150 mcg total) by mouth daily.  Marland Kitchen loperamide (IMODIUM) 2 MG capsule Take 2 mg by mouth as needed for diarrhea or loose stools.   Marland Kitchen loratadine (CLARITIN) 10 MG tablet TAKE 1 TABLET ONCE DAILY.  Marland Kitchen losartan (COZAAR) 50 MG tablet TAKE 1 TABLET ONCE DAILY.  Marland Kitchen ondansetron (ZOFRAN) 4 MG tablet Take 1 tablet (4 mg total) by mouth every 8 (eight) hours as needed for  nausea or vomiting.  . pantoprazole (PROTONIX) 40 MG tablet TAKE 1 TABLET ONCE DAILY.  . Probiotic Product (PROBIOTIC DAILY) CAPS Take 1 capsule by mouth daily.  . traMADol (ULTRAM) 50 MG tablet Take 1 tablet (50 mg total) by mouth every 6 (six) hours as needed for moderate pain (pain).  Marland Kitchen venlafaxine XR (EFFEXOR XR) 75 MG 24 hr capsule Take 1 capsule (75 mg total) by mouth daily with breakfast.  . vitamin B-12 (CYANOCOBALAMIN) 1000 MCG tablet Take 1,000 mcg by mouth daily.  . [DISCONTINUED] denosumab (PROLIA) 60 MG/ML SOSY injection Inject 60 mg into the skin every 6 (six) months.  . [DISCONTINUED] metoprolol tartrate (LOPRESSOR) 25 MG tablet Take 1-2 tablets by mouth as needed for palpitations. (Patient taking differently: Take 25-50 mg by mouth See admin instructions. Take 1-2 tablets by mouth as needed for palpitations.)  . [DISCONTINUED] naproxen (NAPROSYN) 500 MG tablet Take 1 tablet (500 mg total) by mouth 2 (two) times daily with a meal.   Facility-Administered Encounter Medications as of 07/16/2020  Medication  . 0.9 %  sodium chloride infusion    Review of Systems  Constitutional: Negative for chills, fever and malaise/fatigue.  HENT: Negative for congestion, hearing loss and sore throat.   Eyes: Negative for blurred vision.  Respiratory: Positive for shortness of breath. Negative for cough.   Cardiovascular: Negative for chest pain, palpitations and leg swelling.  Gastrointestinal: Negative for abdominal pain, blood in stool, constipation, diarrhea and melena.  Genitourinary: Negative for dysuria.  Musculoskeletal: Positive for joint pain. Negative for falls.  Skin: Negative for itching and rash.  Neurological: Negative for dizziness and loss of consciousness.       Getting sensation back into fingers and moving them  Psychiatric/Behavioral: Negative for depression and memory loss. The patient is not nervous/anxious and does not have insomnia.     Vitals:   07/16/20 1554   BP: 115/74  Pulse: 76  Temp: 98.8 F (37.1 C)  SpO2: 94%  Weight: 178 lb 12.8 oz (81.1 kg)  Height: 5\' 7"  (1.702 m)  above when temp was rechecked Body mass index is 28 kg/m. Physical Exam Vitals and nursing note reviewed.  Constitutional:      General: She is not in acute distress.    Appearance: Normal appearance. She is not toxic-appearing.  HENT:     Head: Normocephalic and atraumatic.     Right Ear: External ear normal.     Left Ear: External ear normal.     Nose: Nose normal.     Mouth/Throat:     Pharynx: Oropharynx is clear.  Eyes:     Extraocular Movements: Extraocular movements intact.     Conjunctiva/sclera: Conjunctivae normal.     Pupils: Pupils are equal, round, and reactive to light.  Cardiovascular:     Rate and Rhythm: Normal rate and regular rhythm.     Pulses: Normal pulses.     Heart sounds: Normal heart sounds.  Pulmonary:     Breath sounds: No wheezing, rhonchi or rales.     Comments: Right side without breath sounds, left CTA Abdominal:     General: Bowel sounds are normal. There is no distension.     Palpations: Abdomen is soft.     Tenderness: There is no abdominal tenderness. There is no guarding or rebound.  Musculoskeletal:     Cervical back: Neck supple.     Right lower leg: No edema.     Left lower leg: No edema.     Comments: Right shoulder incision site w/o erythema, warmth, drainage, does have ecchymoses developing around it and in axilla; tenderness over anterior chest wall; using sling and ice machine  Lymphadenopathy:     Cervical: No cervical adenopathy.  Skin:    General: Skin is warm and dry.     Capillary Refill: Capillary refill takes less than 2 seconds.  Neurological:     General: No focal deficit present.     Mental Status: She is alert and oriented to person, place, and time.     Cranial Nerves: No cranial nerve deficit.     Motor: No weakness.     Gait: Gait normal.     Comments: Sensation returning to fingers, is  moving them on right  Psychiatric:        Mood and Affect: Mood normal.        Behavior: Behavior normal.        Thought Content: Thought content normal.        Judgment: Judgment normal.     Labs reviewed: Basic Metabolic Panel: Recent Labs    01/31/20 1437 07/09/20 0859  NA 138 137  K 4.6 4.6  CL 101 102  CO2 28 28  GLUCOSE 93 114*  BUN 13 10  CREATININE 0.75 0.64  CALCIUM 10.0 9.6   Liver Function Tests: Recent Labs    01/31/20 1437  AST 24  ALT 21  ALKPHOS 71  BILITOT 0.5  PROT 6.9  ALBUMIN 4.3   No results for input(s): LIPASE, AMYLASE in the last 8760 hours. No results for input(s): AMMONIA in the last 8760 hours. CBC: Recent Labs    07/09/20 0859  WBC 5.0  HGB 11.4*  HCT 34.7*  MCV 99.1  PLT 208   Cardiac Enzymes: No results for input(s): CKTOTAL, CKMB, CKMBINDEX, TROPONINI in the last 8760 hours. BNP: Invalid input(s): POCBNP Lab Results  Component Value Date   HGBA1C 5.2 07/09/2020   Lab Results  Component Value Date   TSH 0.16 (L) 06/11/2020   Lab Results  Component Value Date   VITAMINB12 442 10/31/2018   No results found for: FOLATE Lab Results  Component Value Date   IRON 96 02/27/2009   TIBC 421 03/21/2006   FERRITIN 27.8 02/27/2009     Assessment/Plan 1. Absent breath sounds on right side of chest With low grade temp -suspect due to nerve block in brachial plexus affecting diaphragm and expect will wear off; however, given intubation and current covid pandemic, we did take precautions and obtain a covid swab here and isolate pending results, had a negative one at the hospital 8/13 -note that repeat temp was normal and she has no other symptoms -CXR 2 view portable was also obtained stat to r/o pneumothorax or aspiration though unlikely in view of her otherwise normal vitals with POX 95% RA  2. Status post reverse total replacement of right shoulder -here for short-term rehab, PT, OT -getting ice with ice machine, also  dilaudid for severe pain short-term and tramadol for moderate pain, tylenol mild pain, temporary flexeril for muscle spasms -would like to wean off dilaudid quickly in view of alcohol dependence noted on problem list indicating predisposition to addictive tendencies  3. Other iron deficiency anemia -f/u cbc here postop in about a week -hgb was 11.4 on 8/11  4. Hyperlipidemia LDL goal <100 -diet-managed, not on statins  5. Hypothyroidism, unspecified type -on alternating thyroid dosing -will need f/u tsh when recovered from surgery Lab Results  Component Value Date   TSH 0.16 (L) 06/11/2020   6. B12 deficiency -likely due to combo of alcohol use and bariatric surgery hx -cont b12 daily  7. Bariatric surgery status -notable, weight in overweight range at this time  8. Depression with anxiety -continues on effexor daily with breakfast, spirits good  9. Essential hypertension -bp at goal at this time, cont losartan therapy  Family/ staff Communication: discussed with rehab nursing, nurse managers  Labs/tests ordered:  pCXR 2 view, covid test, isolation pending results, PT, OT  Mithra Spano L. River Ambrosio, D.O. [facility] [address], [place] [address] Cell Phone (Mon-Fri 8am-5pm):  [phone] On Call:  [phone] & follow prompts after 5pm & weekends Office Phone:  [phone] Office Fax:  [phone]

## 2020-07-17 DIAGNOSIS — Z471 Aftercare following joint replacement surgery: Secondary | ICD-10-CM | POA: Diagnosis not present

## 2020-07-17 DIAGNOSIS — R278 Other lack of coordination: Secondary | ICD-10-CM | POA: Diagnosis not present

## 2020-07-17 DIAGNOSIS — R2689 Other abnormalities of gait and mobility: Secondary | ICD-10-CM | POA: Diagnosis not present

## 2020-07-17 DIAGNOSIS — M75111 Incomplete rotator cuff tear or rupture of right shoulder, not specified as traumatic: Secondary | ICD-10-CM | POA: Diagnosis not present

## 2020-07-17 DIAGNOSIS — M63811 Disorders of muscle in diseases classified elsewhere, right shoulder: Secondary | ICD-10-CM | POA: Diagnosis not present

## 2020-07-17 DIAGNOSIS — M25511 Pain in right shoulder: Secondary | ICD-10-CM | POA: Diagnosis not present

## 2020-07-17 DIAGNOSIS — R600 Localized edema: Secondary | ICD-10-CM | POA: Diagnosis not present

## 2020-07-17 NOTE — Anesthesia Postprocedure Evaluation (Signed)
Anesthesia Post Note  Patient: DEJANAY WAMBOLDT  Procedure(s) Performed: REVERSE SHOULDER ARTHROPLASTY (Right Shoulder)     Patient location during evaluation: PACU Anesthesia Type: Regional and General Level of consciousness: awake and alert Pain management: pain level controlled Vital Signs Assessment: post-procedure vital signs reviewed and stable Respiratory status: spontaneous breathing, nonlabored ventilation, respiratory function stable and patient connected to nasal cannula oxygen Cardiovascular status: blood pressure returned to baseline and stable Postop Assessment: no apparent nausea or vomiting Anesthetic complications: no   No complications documented.  Last Vitals:  Vitals:   07/15/20 1200 07/15/20 1323  BP: 119/80 132/77  Pulse: 71 70  Resp:  15  Temp: 36.6 C 36.6 C  SpO2: 96% 97%    Last Pain:  Vitals:   07/16/20 0906  TempSrc:   PainSc: 0-No pain                 Berk Pilot

## 2020-07-18 ENCOUNTER — Encounter (HOSPITAL_COMMUNITY): Payer: Self-pay | Admitting: Orthopedic Surgery

## 2020-07-18 ENCOUNTER — Other Ambulatory Visit: Payer: Self-pay | Admitting: Adult Health

## 2020-07-18 DIAGNOSIS — M63811 Disorders of muscle in diseases classified elsewhere, right shoulder: Secondary | ICD-10-CM | POA: Diagnosis not present

## 2020-07-18 DIAGNOSIS — R600 Localized edema: Secondary | ICD-10-CM | POA: Diagnosis not present

## 2020-07-18 DIAGNOSIS — R278 Other lack of coordination: Secondary | ICD-10-CM | POA: Diagnosis not present

## 2020-07-18 DIAGNOSIS — M75111 Incomplete rotator cuff tear or rupture of right shoulder, not specified as traumatic: Secondary | ICD-10-CM | POA: Diagnosis not present

## 2020-07-18 DIAGNOSIS — M25511 Pain in right shoulder: Secondary | ICD-10-CM | POA: Diagnosis not present

## 2020-07-18 DIAGNOSIS — Z471 Aftercare following joint replacement surgery: Secondary | ICD-10-CM | POA: Diagnosis not present

## 2020-07-18 DIAGNOSIS — R2689 Other abnormalities of gait and mobility: Secondary | ICD-10-CM | POA: Diagnosis not present

## 2020-07-18 MED ORDER — HYDROMORPHONE HCL 2 MG PO TABS: 2.0000 mg | ORAL_TABLET | ORAL | 0 refills | Status: DC | PRN

## 2020-07-18 MED ORDER — DENOSUMAB 60 MG/ML ~~LOC~~ SOSY: 60.0000 mg | PREFILLED_SYRINGE | SUBCUTANEOUS | 1 refills | Status: DC

## 2020-07-21 DIAGNOSIS — Z20828 Contact with and (suspected) exposure to other viral communicable diseases: Secondary | ICD-10-CM | POA: Diagnosis not present

## 2020-07-21 DIAGNOSIS — Z9189 Other specified personal risk factors, not elsewhere classified: Secondary | ICD-10-CM | POA: Diagnosis not present

## 2020-07-21 DIAGNOSIS — M75111 Incomplete rotator cuff tear or rupture of right shoulder, not specified as traumatic: Secondary | ICD-10-CM | POA: Diagnosis not present

## 2020-07-21 DIAGNOSIS — R600 Localized edema: Secondary | ICD-10-CM | POA: Diagnosis not present

## 2020-07-21 DIAGNOSIS — R278 Other lack of coordination: Secondary | ICD-10-CM | POA: Diagnosis not present

## 2020-07-21 DIAGNOSIS — M25511 Pain in right shoulder: Secondary | ICD-10-CM | POA: Diagnosis not present

## 2020-07-21 DIAGNOSIS — M63811 Disorders of muscle in diseases classified elsewhere, right shoulder: Secondary | ICD-10-CM | POA: Diagnosis not present

## 2020-07-21 DIAGNOSIS — Z471 Aftercare following joint replacement surgery: Secondary | ICD-10-CM | POA: Diagnosis not present

## 2020-07-21 DIAGNOSIS — R2689 Other abnormalities of gait and mobility: Secondary | ICD-10-CM | POA: Diagnosis not present

## 2020-07-22 DIAGNOSIS — R278 Other lack of coordination: Secondary | ICD-10-CM | POA: Diagnosis not present

## 2020-07-22 DIAGNOSIS — Z471 Aftercare following joint replacement surgery: Secondary | ICD-10-CM | POA: Diagnosis not present

## 2020-07-22 DIAGNOSIS — R2689 Other abnormalities of gait and mobility: Secondary | ICD-10-CM | POA: Diagnosis not present

## 2020-07-22 DIAGNOSIS — M75111 Incomplete rotator cuff tear or rupture of right shoulder, not specified as traumatic: Secondary | ICD-10-CM | POA: Diagnosis not present

## 2020-07-22 DIAGNOSIS — R600 Localized edema: Secondary | ICD-10-CM | POA: Diagnosis not present

## 2020-07-22 DIAGNOSIS — M63811 Disorders of muscle in diseases classified elsewhere, right shoulder: Secondary | ICD-10-CM | POA: Diagnosis not present

## 2020-07-22 DIAGNOSIS — M25511 Pain in right shoulder: Secondary | ICD-10-CM | POA: Diagnosis not present

## 2020-07-23 DIAGNOSIS — Z471 Aftercare following joint replacement surgery: Secondary | ICD-10-CM | POA: Diagnosis not present

## 2020-07-23 DIAGNOSIS — R2689 Other abnormalities of gait and mobility: Secondary | ICD-10-CM | POA: Diagnosis not present

## 2020-07-23 DIAGNOSIS — M63811 Disorders of muscle in diseases classified elsewhere, right shoulder: Secondary | ICD-10-CM | POA: Diagnosis not present

## 2020-07-23 DIAGNOSIS — R600 Localized edema: Secondary | ICD-10-CM | POA: Diagnosis not present

## 2020-07-23 DIAGNOSIS — R278 Other lack of coordination: Secondary | ICD-10-CM | POA: Diagnosis not present

## 2020-07-23 DIAGNOSIS — M25511 Pain in right shoulder: Secondary | ICD-10-CM | POA: Diagnosis not present

## 2020-07-23 DIAGNOSIS — M75111 Incomplete rotator cuff tear or rupture of right shoulder, not specified as traumatic: Secondary | ICD-10-CM | POA: Diagnosis not present

## 2020-07-24 DIAGNOSIS — M25511 Pain in right shoulder: Secondary | ICD-10-CM | POA: Diagnosis not present

## 2020-07-24 DIAGNOSIS — R600 Localized edema: Secondary | ICD-10-CM | POA: Diagnosis not present

## 2020-07-24 DIAGNOSIS — Z471 Aftercare following joint replacement surgery: Secondary | ICD-10-CM | POA: Diagnosis not present

## 2020-07-24 DIAGNOSIS — M75111 Incomplete rotator cuff tear or rupture of right shoulder, not specified as traumatic: Secondary | ICD-10-CM | POA: Diagnosis not present

## 2020-07-24 DIAGNOSIS — R2689 Other abnormalities of gait and mobility: Secondary | ICD-10-CM | POA: Diagnosis not present

## 2020-07-24 DIAGNOSIS — M63811 Disorders of muscle in diseases classified elsewhere, right shoulder: Secondary | ICD-10-CM | POA: Diagnosis not present

## 2020-07-24 DIAGNOSIS — R278 Other lack of coordination: Secondary | ICD-10-CM | POA: Diagnosis not present

## 2020-07-25 DIAGNOSIS — Z471 Aftercare following joint replacement surgery: Secondary | ICD-10-CM | POA: Diagnosis not present

## 2020-07-25 DIAGNOSIS — M63811 Disorders of muscle in diseases classified elsewhere, right shoulder: Secondary | ICD-10-CM | POA: Diagnosis not present

## 2020-07-25 DIAGNOSIS — R600 Localized edema: Secondary | ICD-10-CM | POA: Diagnosis not present

## 2020-07-25 DIAGNOSIS — R2689 Other abnormalities of gait and mobility: Secondary | ICD-10-CM | POA: Diagnosis not present

## 2020-07-25 DIAGNOSIS — M75111 Incomplete rotator cuff tear or rupture of right shoulder, not specified as traumatic: Secondary | ICD-10-CM | POA: Diagnosis not present

## 2020-07-25 DIAGNOSIS — R278 Other lack of coordination: Secondary | ICD-10-CM | POA: Diagnosis not present

## 2020-07-25 DIAGNOSIS — M25511 Pain in right shoulder: Secondary | ICD-10-CM | POA: Diagnosis not present

## 2020-07-28 ENCOUNTER — Non-Acute Institutional Stay (SKILLED_NURSING_FACILITY): Payer: Medicare PPO | Admitting: Adult Health

## 2020-07-28 ENCOUNTER — Encounter: Payer: Self-pay | Admitting: Adult Health

## 2020-07-28 DIAGNOSIS — Z96611 Presence of right artificial shoulder joint: Secondary | ICD-10-CM | POA: Diagnosis not present

## 2020-07-28 DIAGNOSIS — M12811 Other specific arthropathies, not elsewhere classified, right shoulder: Secondary | ICD-10-CM

## 2020-07-28 DIAGNOSIS — R0989 Other specified symptoms and signs involving the circulatory and respiratory systems: Secondary | ICD-10-CM | POA: Diagnosis not present

## 2020-07-28 DIAGNOSIS — K5901 Slow transit constipation: Secondary | ICD-10-CM | POA: Diagnosis not present

## 2020-07-28 DIAGNOSIS — Z471 Aftercare following joint replacement surgery: Secondary | ICD-10-CM | POA: Diagnosis not present

## 2020-07-28 NOTE — Progress Notes (Signed)
Location:  Occupational psychologist of Service:  SNF (31)  Provider:  Cindi Carbon, ANP [place] [phone]   PCP: Carollee Herter, Alferd Apa, DO Patient Care Team: Carollee Herter, Alferd Apa, DO as PCP - Gaston Islam, MD as Consulting Physician (Orthopedic Surgery) Marica Otter, Olin as Consulting Physician (Optometry) Sharyn Lull Mottinger as Consulting Physician (Dentistry) Marica Otter, OD [facility])  Extended Emergency Contact Information Primary Emergency Contact: [facility] Address: [address]          [place], [place] [address] Johnnette Litter of [place] Phone: [phone] Relation: Daughter  Code Status: Full code  Goals of care:  Advanced Directive information Advanced Directives 07/16/2020  Does Patient Have a Medical Advance Directive? -  Type of Advance Directive Living will  Does patient want to make changes to medical advance directive? -  Copy of [place] in Chart? Yes - validated most recent copy scanned in chart (See row information)  Would patient like information on creating a medical advance directive? -     Allergies  Allergen Reactions   Sulfonamide Derivatives Diarrhea   Cocaine     Other reaction(s): Hallucination Sinus irrigation   Montelukast Sodium     UNKNOWN   Penicillins     Local skin reaction    Pioglitazone     UNKNOWN    Rosiglitazone Maleate     UNKNOWN   Secobarbital Sodium     UNKNOWN   Codeine Rash   Doxycycline Nausea Only    REACTION: GI UPSET    Chief Complaint  Patient presents with   Discharge Note    HPI:  79 y.o. female seen for discharge from [facility] s/p right shoulder reverse arthroplasty due to advanced rotator cuff tear arthropathy on 07/15/20.  Ms. Galeno has been working with therapy and made gains. She is able to dress and bath herself and is ready to return to [place]. Her pain is controlled with ultram. She is eating and drinking  well. No issues with constipation or dysuria. She requested her colace be changed to as needed due to loose stools. During her stay she had low grade temps and decreased breath sounds on the right. A CXR was done which did not show pna but did indicated an elevated right diaphragm.. Since that time she has been doing her IS and ambulating. She has no resp complaints at this time. Nurse reports her lungs sound better. Covid swab neg 8/18.      Past Medical History:  Diagnosis Date   Abnormal liver function tests    Allergy    Anxiety    Arthritis    knees, lower back    Asthma    Cataract    left eye growing cataract    Depression    Diabetes mellitus    had gastric bypass DM resolved-    Diverticulosis    GERD (gastroesophageal reflux disease)    Headache    Previous migraines   Heart murmur    History of colon polyps    adenomatous   Hyperlipidemia    past hx- resloved after gastric bypass    Hypertension    Hypothyroidism    IBS (irritable bowel syndrome)    Iron deficiency anemia    Osteoporosis     Past Surgical History:  Procedure Laterality Date   ABDOMINAL HYSTERECTOMY     ABDOMINAL HYSTERECTOMY     APPENDECTOMY     BLADDER REPAIR  BREAST BIOPSY Right    needle core biopsy, benign   CATARACT EXTRACTION Left    CHOLECYSTECTOMY     COLONOSCOPY     CORONARY ANGIOPLASTY     GASTRIC BYPASS     POLYPECTOMY     REVERSE SHOULDER ARTHROPLASTY Right 07/15/2020   Procedure: REVERSE SHOULDER ARTHROPLASTY;  Surgeon: Justice Britain, MD;  Location: [facility];  Service: Orthopedics;  Laterality: Right;  117min   TONSILLECTOMY AND ADENOIDECTOMY        reports that she quit smoking about 58 years ago. Her smoking use included cigarettes. She has a 0.60 pack-year smoking history. She has never used smokeless tobacco. She reports previous alcohol use. She reports that she does not use drugs. Social History   Socioeconomic History   Marital status: Single    Spouse name: Not  on file   Number of children: 1   Years of education: Not on file   Highest education level: Not on file  Occupational History   Occupation: retired Product manager: RETIRED  Tobacco Use   Smoking status: Former Smoker    Packs/day: 0.30    Years: 2.00    Pack years: 0.60    Types: Cigarettes    Quit date: 07/11/1962    Years since quitting: 58.0   Smokeless tobacco: Never Used  Vaping Use   Vaping Use: Never used  Substance and Sexual Activity   Alcohol use: Not Currently    Comment: None in 7 weeks   Drug use: No   Sexual activity: Yes    Birth control/protection: Surgical    Comment: Hysterectomy  Other Topics Concern   Not on file  Social History Narrative   Daily caffeine   Social Determinants of Health   Financial Resource Strain: Low Risk    Difficulty of Paying Living Expenses: Not hard at all  Food Insecurity: No Food Insecurity   Worried About Charity fundraiser in the Last Year: Never true   Ran Out of Food in the Last Year: Never true  Transportation Needs: No Transportation Needs   Lack of Transportation (Medical): No   Lack of Transportation (Non-Medical): No  Physical Activity:    Days of Exercise per Week: Not on file   Minutes of Exercise per Session: Not on file  Stress:    Feeling of Stress : Not on file  Social Connections:    Frequency of Communication with Friends and Family: Not on file   Frequency of Social Gatherings with Friends and Family: Not on file   Attends Religious Services: Not on file   Active Member of Clubs or Organizations: Not on file   Attends Archivist Meetings: Not on file   Marital Status: Not on file  Intimate Partner Violence:    Fear of Current or Ex-Partner: Not on file   Emotionally Abused: Not on file   Physically Abused: Not on file   Sexually Abused: Not on file   Functional Status Survey:    Allergies  Allergen Reactions   Sulfonamide Derivatives Diarrhea   Cocaine     Other  reaction(s): Hallucination Sinus irrigation   Montelukast Sodium     UNKNOWN   Penicillins     Local skin reaction    Pioglitazone     UNKNOWN    Rosiglitazone Maleate     UNKNOWN   Secobarbital Sodium     UNKNOWN   Codeine Rash   Doxycycline Nausea Only    REACTION: GI  UPSET    Pertinent  Health Maintenance Due  Topic Date Due   OPHTHALMOLOGY EXAM  07/21/2018   FOOT EXAM  08/05/2018   INFLUENZA VACCINE  06/29/2020   HEMOGLOBIN A1C  01/09/2021   DEXA SCAN  Completed   PNA vac Low Risk Adult  Completed    Medications: Outpatient Encounter Medications as of 07/28/2020  Medication Sig   docusate sodium (COLACE) 100 MG capsule Take 100 mg by mouth 2 (two) times daily as needed for mild constipation.   acetaminophen (TYLENOL) 650 MG CR tablet Take 650 mg by mouth every 8 (eight) hours as needed for pain.   cholecalciferol (VITAMIN D) 1000 UNITS tablet Take 1,000 Units by mouth daily.     cyclobenzaprine (FLEXERIL) 10 MG tablet Take 1 tablet (10 mg total) by mouth 3 (three) times daily as needed for muscle spasms.   denosumab (PROLIA) 60 MG/ML SOSY injection Inject 60 mg into the skin every 6 (six) months.   fluticasone (FLONASE) 50 MCG/ACT nasal spray USE 2 SPRAYS IN EACH NOSTRIL ONCE DAILY   levothyroxine (SYNTHROID) 137 MCG tablet Take 1 tablet (137 mcg total) by mouth daily before breakfast. Take 1 tablet (137 mcg total) by mouth M,W,F. Take 150 mcg all other days.   levothyroxine (SYNTHROID) 150 MCG tablet Take 1 tablet (150 mcg total) by mouth daily.   loperamide (IMODIUM) 2 MG capsule Take 2 mg by mouth as needed for diarrhea or loose stools.    loratadine (CLARITIN) 10 MG tablet TAKE 1 TABLET ONCE DAILY.   losartan (COZAAR) 50 MG tablet TAKE 1 TABLET ONCE DAILY.   ondansetron (ZOFRAN) 4 MG tablet Take 1 tablet (4 mg total) by mouth every 8 (eight) hours as needed for nausea or vomiting.   pantoprazole (PROTONIX) 40 MG tablet TAKE 1 TABLET ONCE DAILY.   Probiotic Product  (PROBIOTIC DAILY) CAPS Take 1 capsule by mouth daily.   traMADol (ULTRAM) 50 MG tablet Take 1 tablet (50 mg total) by mouth every 6 (six) hours as needed for moderate pain (pain).   venlafaxine XR (EFFEXOR XR) 75 MG 24 hr capsule Take 1 capsule (75 mg total) by mouth daily with breakfast.   vitamin B-12 (CYANOCOBALAMIN) 1000 MCG tablet Take 1,000 mcg by mouth daily.   [DISCONTINUED] HYDROmorphone (DILAUDID) 2 MG tablet Take 1 tablet (2 mg total) by mouth every 4 (four) hours as needed for severe pain (for post op pain not controlled by tramadol).   [DISCONTINUED] 0.9 %  sodium chloride infusion    No facility-administered encounter medications on file as of 07/28/2020.    Review of Systems  Constitutional: Negative for activity change, appetite change, chills, diaphoresis, fatigue, fever and unexpected weight change.  HENT: Negative for congestion.   Respiratory: Negative for cough, shortness of breath and wheezing.   Cardiovascular: Negative for chest pain, palpitations and leg swelling.  Gastrointestinal: Positive for diarrhea (due to colace). Negative for abdominal distention, abdominal pain and constipation.  Genitourinary: Negative for difficulty urinating and dysuria.  Musculoskeletal: Negative for arthralgias, back pain, gait problem, joint swelling and myalgias.       Right shoulder pain   Neurological: Negative for dizziness, tremors, seizures, syncope, facial asymmetry, speech difficulty, weakness, light-headedness, numbness and headaches.  Psychiatric/Behavioral: Negative for agitation, behavioral problems and confusion.    Vitals:   07/29/20 0735  BP: 112/63  Pulse: 75  Resp: 18  Temp: (!) 97.5 F (36.4 C)  SpO2: 98%   There is no height or weight on file  to calculate BMI. Physical Exam Vitals and nursing note reviewed.  Constitutional:      General: She is not in acute distress.    Appearance: She is not diaphoretic.  HENT:     Head: Normocephalic and atraumatic.    Neck:     Vascular: No JVD.  Cardiovascular:     Rate and Rhythm: Normal rate and regular rhythm.     Heart sounds: No murmur heard.   Pulmonary:     Effort: Pulmonary effort is normal. No respiratory distress.     Breath sounds: Normal breath sounds. No wheezing.  Abdominal:     General: Bowel sounds are normal.     Palpations: Abdomen is soft.  Musculoskeletal:     Right lower leg: No edema.     Left lower leg: No edema.     Comments: RUE 4/5 LUE 5/5.  Pos radial pulse bilat. CMS intact  Skin:    General: Skin is warm and dry.     Comments: Dressing to right shoulder CDI.  Neurological:     Mental Status: She is alert and oriented to person, place, and time.  Psychiatric:        Mood and Affect: Mood normal.     Labs reviewed: Basic Metabolic Panel: Recent Labs    01/31/20 1437 07/09/20 0859  NA 138 137  K 4.6 4.6  CL 101 102  CO2 28 28  GLUCOSE 93 114*  BUN 13 10  CREATININE 0.75 0.64  CALCIUM 10.0 9.6   Liver Function Tests: Recent Labs    01/31/20 1437  AST 24  ALT 21  ALKPHOS 71  BILITOT 0.5  PROT 6.9  ALBUMIN 4.3   No results for input(s): LIPASE, AMYLASE in the last 8760 hours. No results for input(s): AMMONIA in the last 8760 hours. CBC: Recent Labs    07/09/20 0859  WBC 5.0  HGB 11.4*  HCT 34.7*  MCV 99.1  PLT 208   Cardiac Enzymes: No results for input(s): CKTOTAL, CKMB, CKMBINDEX, TROPONINI in the last 8760 hours. BNP: Invalid input(s): POCBNP CBG: Recent Labs    07/09/20 0829  GLUCAP 107*    Procedures and Imaging Studies During Stay: No results found.  Assessment/Plan:    1. Rotator cuff arthropathy of right shoulder Failed prior treatments  2. Status post reverse total replacement of right shoulder Improved pain and function. Will f/u with Dr. Onnie Graham and possibly d/c home in the am. Continues to work with OT and will be discharged with ultram for pain. Dilaudid discontinued.   3. 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 Slow transit constipation Change colace to bid prn   Patient is being discharged with the following home health services:  OT  Patient is being discharged with the following durable medical equipment:  NA  Patient has been advised to f/u with their PCP in 1-2 weeks to for a transitions of care visit.  Social services at their facility was responsible for arranging this appointment.  Pt was provided with adequate prescriptions of noncontrolled medications to reach the scheduled appointment .  For controlled substances, a limited supply was provided as appropriate for the individual patient.  If the pt normally receives these medications from a pain clinic or has a contract with another physician, these medications should be received from that clinic or physician only).    Future labs/tests needed:  NA

## 2020-07-29 ENCOUNTER — Other Ambulatory Visit: Payer: Self-pay | Admitting: Internal Medicine

## 2020-07-29 DIAGNOSIS — Z471 Aftercare following joint replacement surgery: Secondary | ICD-10-CM | POA: Diagnosis not present

## 2020-07-29 DIAGNOSIS — M12811 Other specific arthropathies, not elsewhere classified, right shoulder: Secondary | ICD-10-CM

## 2020-07-29 DIAGNOSIS — R278 Other lack of coordination: Secondary | ICD-10-CM | POA: Diagnosis not present

## 2020-07-29 DIAGNOSIS — R600 Localized edema: Secondary | ICD-10-CM | POA: Diagnosis not present

## 2020-07-29 DIAGNOSIS — M25511 Pain in right shoulder: Secondary | ICD-10-CM | POA: Diagnosis not present

## 2020-07-29 DIAGNOSIS — M63811 Disorders of muscle in diseases classified elsewhere, right shoulder: Secondary | ICD-10-CM | POA: Diagnosis not present

## 2020-07-29 DIAGNOSIS — R2689 Other abnormalities of gait and mobility: Secondary | ICD-10-CM | POA: Diagnosis not present

## 2020-07-29 DIAGNOSIS — M75111 Incomplete rotator cuff tear or rupture of right shoulder, not specified as traumatic: Secondary | ICD-10-CM | POA: Diagnosis not present

## 2020-07-29 DIAGNOSIS — Z96611 Presence of right artificial shoulder joint: Secondary | ICD-10-CM

## 2020-07-29 MED ORDER — TRAMADOL HCL 50 MG PO TABS: 50.0000 mg | ORAL_TABLET | Freq: Two times a day (BID) | ORAL | 0 refills | Status: AC | PRN

## 2020-07-30 DIAGNOSIS — R278 Other lack of coordination: Secondary | ICD-10-CM | POA: Diagnosis not present

## 2020-07-30 DIAGNOSIS — M75111 Incomplete rotator cuff tear or rupture of right shoulder, not specified as traumatic: Secondary | ICD-10-CM | POA: Diagnosis not present

## 2020-07-30 DIAGNOSIS — Z471 Aftercare following joint replacement surgery: Secondary | ICD-10-CM | POA: Diagnosis not present

## 2020-07-30 DIAGNOSIS — R600 Localized edema: Secondary | ICD-10-CM | POA: Diagnosis not present

## 2020-07-30 DIAGNOSIS — M25511 Pain in right shoulder: Secondary | ICD-10-CM | POA: Diagnosis not present

## 2020-08-01 DIAGNOSIS — Z471 Aftercare following joint replacement surgery: Secondary | ICD-10-CM | POA: Diagnosis not present

## 2020-08-01 DIAGNOSIS — R278 Other lack of coordination: Secondary | ICD-10-CM | POA: Diagnosis not present

## 2020-08-01 DIAGNOSIS — R600 Localized edema: Secondary | ICD-10-CM | POA: Diagnosis not present

## 2020-08-01 DIAGNOSIS — M75111 Incomplete rotator cuff tear or rupture of right shoulder, not specified as traumatic: Secondary | ICD-10-CM | POA: Diagnosis not present

## 2020-08-01 DIAGNOSIS — M25511 Pain in right shoulder: Secondary | ICD-10-CM | POA: Diagnosis not present

## 2020-08-05 DIAGNOSIS — M75111 Incomplete rotator cuff tear or rupture of right shoulder, not specified as traumatic: Secondary | ICD-10-CM | POA: Diagnosis not present

## 2020-08-05 DIAGNOSIS — R278 Other lack of coordination: Secondary | ICD-10-CM | POA: Diagnosis not present

## 2020-08-05 DIAGNOSIS — Z471 Aftercare following joint replacement surgery: Secondary | ICD-10-CM | POA: Diagnosis not present

## 2020-08-05 DIAGNOSIS — R600 Localized edema: Secondary | ICD-10-CM | POA: Diagnosis not present

## 2020-08-05 DIAGNOSIS — M25511 Pain in right shoulder: Secondary | ICD-10-CM | POA: Diagnosis not present

## 2020-08-07 DIAGNOSIS — R278 Other lack of coordination: Secondary | ICD-10-CM | POA: Diagnosis not present

## 2020-08-07 DIAGNOSIS — M25511 Pain in right shoulder: Secondary | ICD-10-CM | POA: Diagnosis not present

## 2020-08-07 DIAGNOSIS — Z471 Aftercare following joint replacement surgery: Secondary | ICD-10-CM | POA: Diagnosis not present

## 2020-08-07 DIAGNOSIS — R600 Localized edema: Secondary | ICD-10-CM | POA: Diagnosis not present

## 2020-08-07 DIAGNOSIS — M75111 Incomplete rotator cuff tear or rupture of right shoulder, not specified as traumatic: Secondary | ICD-10-CM | POA: Diagnosis not present

## 2020-08-08 ENCOUNTER — Other Ambulatory Visit: Payer: Self-pay | Admitting: Family Medicine

## 2020-08-08 DIAGNOSIS — L570 Actinic keratosis: Secondary | ICD-10-CM | POA: Diagnosis not present

## 2020-08-08 DIAGNOSIS — D1801 Hemangioma of skin and subcutaneous tissue: Secondary | ICD-10-CM | POA: Diagnosis not present

## 2020-08-08 DIAGNOSIS — L814 Other melanin hyperpigmentation: Secondary | ICD-10-CM | POA: Diagnosis not present

## 2020-08-08 DIAGNOSIS — L821 Other seborrheic keratosis: Secondary | ICD-10-CM | POA: Diagnosis not present

## 2020-08-11 ENCOUNTER — Other Ambulatory Visit: Payer: Self-pay | Admitting: Family Medicine

## 2020-08-11 DIAGNOSIS — I10 Essential (primary) hypertension: Secondary | ICD-10-CM

## 2020-08-11 DIAGNOSIS — R278 Other lack of coordination: Secondary | ICD-10-CM | POA: Diagnosis not present

## 2020-08-11 DIAGNOSIS — Z471 Aftercare following joint replacement surgery: Secondary | ICD-10-CM | POA: Diagnosis not present

## 2020-08-11 DIAGNOSIS — M75111 Incomplete rotator cuff tear or rupture of right shoulder, not specified as traumatic: Secondary | ICD-10-CM | POA: Diagnosis not present

## 2020-08-11 DIAGNOSIS — R600 Localized edema: Secondary | ICD-10-CM | POA: Diagnosis not present

## 2020-08-11 DIAGNOSIS — M25511 Pain in right shoulder: Secondary | ICD-10-CM | POA: Diagnosis not present

## 2020-08-15 DIAGNOSIS — Z471 Aftercare following joint replacement surgery: Secondary | ICD-10-CM | POA: Diagnosis not present

## 2020-08-15 DIAGNOSIS — R278 Other lack of coordination: Secondary | ICD-10-CM | POA: Diagnosis not present

## 2020-08-15 DIAGNOSIS — M25511 Pain in right shoulder: Secondary | ICD-10-CM | POA: Diagnosis not present

## 2020-08-15 DIAGNOSIS — R600 Localized edema: Secondary | ICD-10-CM | POA: Diagnosis not present

## 2020-08-15 DIAGNOSIS — M75111 Incomplete rotator cuff tear or rupture of right shoulder, not specified as traumatic: Secondary | ICD-10-CM | POA: Diagnosis not present

## 2020-08-18 DIAGNOSIS — M25511 Pain in right shoulder: Secondary | ICD-10-CM | POA: Diagnosis not present

## 2020-08-18 DIAGNOSIS — Z471 Aftercare following joint replacement surgery: Secondary | ICD-10-CM | POA: Diagnosis not present

## 2020-08-18 DIAGNOSIS — R600 Localized edema: Secondary | ICD-10-CM | POA: Diagnosis not present

## 2020-08-18 DIAGNOSIS — R278 Other lack of coordination: Secondary | ICD-10-CM | POA: Diagnosis not present

## 2020-08-18 DIAGNOSIS — M75111 Incomplete rotator cuff tear or rupture of right shoulder, not specified as traumatic: Secondary | ICD-10-CM | POA: Diagnosis not present

## 2020-08-20 DIAGNOSIS — R278 Other lack of coordination: Secondary | ICD-10-CM | POA: Diagnosis not present

## 2020-08-20 DIAGNOSIS — Z471 Aftercare following joint replacement surgery: Secondary | ICD-10-CM | POA: Diagnosis not present

## 2020-08-20 DIAGNOSIS — Z9889 Other specified postprocedural states: Secondary | ICD-10-CM | POA: Insufficient documentation

## 2020-08-20 DIAGNOSIS — R600 Localized edema: Secondary | ICD-10-CM | POA: Diagnosis not present

## 2020-08-20 DIAGNOSIS — M75111 Incomplete rotator cuff tear or rupture of right shoulder, not specified as traumatic: Secondary | ICD-10-CM | POA: Diagnosis not present

## 2020-08-20 DIAGNOSIS — M25511 Pain in right shoulder: Secondary | ICD-10-CM | POA: Diagnosis not present

## 2020-08-22 DIAGNOSIS — Z471 Aftercare following joint replacement surgery: Secondary | ICD-10-CM | POA: Diagnosis not present

## 2020-08-22 DIAGNOSIS — R600 Localized edema: Secondary | ICD-10-CM | POA: Diagnosis not present

## 2020-08-22 DIAGNOSIS — M25511 Pain in right shoulder: Secondary | ICD-10-CM | POA: Diagnosis not present

## 2020-08-22 DIAGNOSIS — R278 Other lack of coordination: Secondary | ICD-10-CM | POA: Diagnosis not present

## 2020-08-22 DIAGNOSIS — M75111 Incomplete rotator cuff tear or rupture of right shoulder, not specified as traumatic: Secondary | ICD-10-CM | POA: Diagnosis not present

## 2020-08-25 DIAGNOSIS — M75111 Incomplete rotator cuff tear or rupture of right shoulder, not specified as traumatic: Secondary | ICD-10-CM | POA: Diagnosis not present

## 2020-08-25 DIAGNOSIS — R278 Other lack of coordination: Secondary | ICD-10-CM | POA: Diagnosis not present

## 2020-08-25 DIAGNOSIS — Z471 Aftercare following joint replacement surgery: Secondary | ICD-10-CM | POA: Diagnosis not present

## 2020-08-25 DIAGNOSIS — M25511 Pain in right shoulder: Secondary | ICD-10-CM | POA: Diagnosis not present

## 2020-08-25 DIAGNOSIS — R600 Localized edema: Secondary | ICD-10-CM | POA: Diagnosis not present

## 2020-08-27 DIAGNOSIS — R600 Localized edema: Secondary | ICD-10-CM | POA: Diagnosis not present

## 2020-08-27 DIAGNOSIS — R278 Other lack of coordination: Secondary | ICD-10-CM | POA: Diagnosis not present

## 2020-08-27 DIAGNOSIS — M25511 Pain in right shoulder: Secondary | ICD-10-CM | POA: Diagnosis not present

## 2020-08-27 DIAGNOSIS — Z471 Aftercare following joint replacement surgery: Secondary | ICD-10-CM | POA: Diagnosis not present

## 2020-08-27 DIAGNOSIS — M75111 Incomplete rotator cuff tear or rupture of right shoulder, not specified as traumatic: Secondary | ICD-10-CM | POA: Diagnosis not present

## 2020-08-29 ENCOUNTER — Encounter: Payer: Self-pay | Admitting: Orthopedic Surgery

## 2020-08-29 DIAGNOSIS — R278 Other lack of coordination: Secondary | ICD-10-CM | POA: Diagnosis not present

## 2020-08-29 DIAGNOSIS — Z471 Aftercare following joint replacement surgery: Secondary | ICD-10-CM | POA: Diagnosis not present

## 2020-08-29 DIAGNOSIS — R600 Localized edema: Secondary | ICD-10-CM | POA: Diagnosis not present

## 2020-08-29 DIAGNOSIS — M75111 Incomplete rotator cuff tear or rupture of right shoulder, not specified as traumatic: Secondary | ICD-10-CM | POA: Diagnosis not present

## 2020-08-29 DIAGNOSIS — M25511 Pain in right shoulder: Secondary | ICD-10-CM | POA: Diagnosis not present

## 2020-09-03 DIAGNOSIS — M25511 Pain in right shoulder: Secondary | ICD-10-CM | POA: Diagnosis not present

## 2020-09-03 DIAGNOSIS — Z471 Aftercare following joint replacement surgery: Secondary | ICD-10-CM | POA: Diagnosis not present

## 2020-09-03 DIAGNOSIS — M75111 Incomplete rotator cuff tear or rupture of right shoulder, not specified as traumatic: Secondary | ICD-10-CM | POA: Diagnosis not present

## 2020-09-03 DIAGNOSIS — R278 Other lack of coordination: Secondary | ICD-10-CM | POA: Diagnosis not present

## 2020-09-03 DIAGNOSIS — R600 Localized edema: Secondary | ICD-10-CM | POA: Diagnosis not present

## 2020-09-05 DIAGNOSIS — Z471 Aftercare following joint replacement surgery: Secondary | ICD-10-CM | POA: Diagnosis not present

## 2020-09-05 DIAGNOSIS — R600 Localized edema: Secondary | ICD-10-CM | POA: Diagnosis not present

## 2020-09-05 DIAGNOSIS — R278 Other lack of coordination: Secondary | ICD-10-CM | POA: Diagnosis not present

## 2020-09-05 DIAGNOSIS — M25511 Pain in right shoulder: Secondary | ICD-10-CM | POA: Diagnosis not present

## 2020-09-05 DIAGNOSIS — M75111 Incomplete rotator cuff tear or rupture of right shoulder, not specified as traumatic: Secondary | ICD-10-CM | POA: Diagnosis not present

## 2020-09-15 DIAGNOSIS — R278 Other lack of coordination: Secondary | ICD-10-CM | POA: Diagnosis not present

## 2020-09-15 DIAGNOSIS — M75111 Incomplete rotator cuff tear or rupture of right shoulder, not specified as traumatic: Secondary | ICD-10-CM | POA: Diagnosis not present

## 2020-09-15 DIAGNOSIS — Z471 Aftercare following joint replacement surgery: Secondary | ICD-10-CM | POA: Diagnosis not present

## 2020-09-15 DIAGNOSIS — M25511 Pain in right shoulder: Secondary | ICD-10-CM | POA: Diagnosis not present

## 2020-09-15 DIAGNOSIS — R600 Localized edema: Secondary | ICD-10-CM | POA: Diagnosis not present

## 2020-09-17 DIAGNOSIS — M75111 Incomplete rotator cuff tear or rupture of right shoulder, not specified as traumatic: Secondary | ICD-10-CM | POA: Diagnosis not present

## 2020-09-17 DIAGNOSIS — R600 Localized edema: Secondary | ICD-10-CM | POA: Diagnosis not present

## 2020-09-17 DIAGNOSIS — R278 Other lack of coordination: Secondary | ICD-10-CM | POA: Diagnosis not present

## 2020-09-17 DIAGNOSIS — M25511 Pain in right shoulder: Secondary | ICD-10-CM | POA: Diagnosis not present

## 2020-09-17 DIAGNOSIS — Z471 Aftercare following joint replacement surgery: Secondary | ICD-10-CM | POA: Diagnosis not present

## 2020-09-19 ENCOUNTER — Other Ambulatory Visit: Payer: Self-pay | Admitting: Family Medicine

## 2020-09-19 DIAGNOSIS — M75111 Incomplete rotator cuff tear or rupture of right shoulder, not specified as traumatic: Secondary | ICD-10-CM | POA: Diagnosis not present

## 2020-09-19 DIAGNOSIS — R278 Other lack of coordination: Secondary | ICD-10-CM | POA: Diagnosis not present

## 2020-09-19 DIAGNOSIS — Z471 Aftercare following joint replacement surgery: Secondary | ICD-10-CM | POA: Diagnosis not present

## 2020-09-19 DIAGNOSIS — K219 Gastro-esophageal reflux disease without esophagitis: Secondary | ICD-10-CM

## 2020-09-19 DIAGNOSIS — M25511 Pain in right shoulder: Secondary | ICD-10-CM | POA: Diagnosis not present

## 2020-09-19 DIAGNOSIS — R600 Localized edema: Secondary | ICD-10-CM | POA: Diagnosis not present

## 2020-09-19 MED ORDER — LEVOTHYROXINE SODIUM 137 MCG PO TABS
ORAL_TABLET | ORAL | 5 refills | Status: DC
Start: 2020-09-19 — End: 2020-11-06

## 2020-09-20 DIAGNOSIS — S80911A Unspecified superficial injury of right knee, initial encounter: Secondary | ICD-10-CM | POA: Diagnosis not present

## 2020-09-20 DIAGNOSIS — S4991XA Unspecified injury of right shoulder and upper arm, initial encounter: Secondary | ICD-10-CM | POA: Diagnosis not present

## 2020-09-22 ENCOUNTER — Other Ambulatory Visit: Payer: Self-pay

## 2020-09-22 ENCOUNTER — Emergency Department (HOSPITAL_COMMUNITY)
Admission: EM | Admit: 2020-09-22 | Discharge: 2020-09-22 | Disposition: A | Discharge status: Home / Self Care | Payer: Medicare PPO | Attending: Emergency Medicine | Admitting: Emergency Medicine

## 2020-09-22 ENCOUNTER — Encounter (HOSPITAL_COMMUNITY): Payer: Self-pay

## 2020-09-22 ENCOUNTER — Emergency Department (HOSPITAL_COMMUNITY): Payer: Medicare PPO

## 2020-09-22 DIAGNOSIS — W19XXXA Unspecified fall, initial encounter: Secondary | ICD-10-CM | POA: Diagnosis not present

## 2020-09-22 DIAGNOSIS — Z87891 Personal history of nicotine dependence: Secondary | ICD-10-CM | POA: Diagnosis not present

## 2020-09-22 DIAGNOSIS — I10 Essential (primary) hypertension: Secondary | ICD-10-CM | POA: Insufficient documentation

## 2020-09-22 DIAGNOSIS — Z79899 Other long term (current) drug therapy: Secondary | ICD-10-CM | POA: Diagnosis not present

## 2020-09-22 DIAGNOSIS — S4991XA Unspecified injury of right shoulder and upper arm, initial encounter: Secondary | ICD-10-CM | POA: Diagnosis not present

## 2020-09-22 DIAGNOSIS — J45909 Unspecified asthma, uncomplicated: Secondary | ICD-10-CM | POA: Insufficient documentation

## 2020-09-22 DIAGNOSIS — S0990XA Unspecified injury of head, initial encounter: Secondary | ICD-10-CM | POA: Insufficient documentation

## 2020-09-22 DIAGNOSIS — Z043 Encounter for examination and observation following other accident: Secondary | ICD-10-CM | POA: Diagnosis not present

## 2020-09-22 DIAGNOSIS — M25561 Pain in right knee: Secondary | ICD-10-CM | POA: Diagnosis not present

## 2020-09-22 DIAGNOSIS — R52 Pain, unspecified: Secondary | ICD-10-CM | POA: Diagnosis not present

## 2020-09-22 DIAGNOSIS — W1830XA Fall on same level, unspecified, initial encounter: Secondary | ICD-10-CM | POA: Diagnosis not present

## 2020-09-22 DIAGNOSIS — E039 Hypothyroidism, unspecified: Secondary | ICD-10-CM | POA: Diagnosis not present

## 2020-09-22 DIAGNOSIS — S8991XA Unspecified injury of right lower leg, initial encounter: Secondary | ICD-10-CM

## 2020-09-22 DIAGNOSIS — R609 Edema, unspecified: Secondary | ICD-10-CM | POA: Diagnosis not present

## 2020-09-22 DIAGNOSIS — Z743 Need for continuous supervision: Secondary | ICD-10-CM | POA: Diagnosis not present

## 2020-09-22 DIAGNOSIS — S0993XA Unspecified injury of face, initial encounter: Secondary | ICD-10-CM

## 2020-09-22 MED ORDER — TRAMADOL HCL 50 MG PO TABS
50.0000 mg | ORAL_TABLET | Freq: Four times a day (QID) | ORAL | 0 refills | Status: DC | PRN
Start: 2020-09-22 — End: 2020-11-06

## 2020-09-22 MED ORDER — TRAMADOL HCL 50 MG PO TABS
50.0000 mg | ORAL_TABLET | Freq: Once | ORAL | Status: AC
  Administered 2020-09-22: 50 mg via ORAL
  Filled 2020-09-22: qty 1

## 2020-09-22 MED ORDER — ACETAMINOPHEN 500 MG PO TABS
1000.0000 mg | ORAL_TABLET | Freq: Once | ORAL | Status: AC
  Administered 2020-09-22: 1000 mg via ORAL
  Filled 2020-09-22: qty 2

## 2020-09-22 NOTE — Discharge Instructions (Signed)
You have been seen today for injuries from a fall. There were no acute abnormalities on the x-rays, including no sign of fracture or dislocation, however, there could be injuries to the soft tissues, such as the ligaments or tendons that are not seen on xrays. There could also be what are called occult fractures that are small fractures not seen on xray. Acetaminophen: May take acetaminophen (generic for Tylenol), as needed, for pain. Your daily total maximum amount of acetaminophen from all sources should be limited to 4000mg /day for persons without liver problems, or 2000mg /day for those with liver problems. Tramadol: May take tramadol as needed for severe pain.   Do not drive or perform other dangerous activities while taking this medication as it can cause drowsiness as well as changes in reaction time and judgement.   Ice: May apply ice to the area over the next 24 hours for 15 minutes at a time to reduce swelling. Elevation: Keep the extremity elevated as often as possible to reduce pain and inflammation. Support: Wear the knee support and arm sling for support and comfort. Wear this until pain resolves.  Follow up: Follow-up with the orthopedic specialist on this matter. Return: Return to the ED for numbness, weakness, increasing pain, overall worsening symptoms, loss of function, or if symptoms are not improving, you have tried to follow up with the orthopedic specialist, and have been unable to do so.  For prescription assistance, may try using prescription discount sites or apps, such as goodrx.com or Good Rx smart phone app.   Head Injury You have been seen today for a head injury, which may or may not have resulted in a concussion. It does not appear to be serious at this time, however, it is important to note that your presentation today is not necessarily an indication of the severity of future symptoms.  Expected symptoms: Expected symptoms of concussion and/or head injury can include  nausea, headache, mild dizziness (should still be able to get up and walk around without difficulty), difficulty concentrating, increased sleep, difficulty sleeping, increased intensity of emotions. Close observation: The close observation period is usually 6 hours from the injury. This includes staying awake and having a trustworthy adult monitor you to assure your condition does not worsen. You should be in regular contact with this person and ideally, they should be able to monitor you in person.  Secondary observation: The secondary observation period is usually 24 hours from the injury. You are allowed to sleep during this time. A trustworthy adult should intermittently monitor you to assure your condition does not worsen.   Overall head injury/concussion care: Rest: Be sure to get plenty of rest. You will need more rest and sleep while you recover. Hydration: Be sure to stay well hydrated by having a goal of drinking about 0.5 liters of water an hour. Return to sports and activities: In general, you may return to normal activities once symptoms have subsided, however, you would ideally be cleared by a primary care provider or other qualified medical professional prior to return to these activities.  Follow up: Follow up with the concussion clinic or your primary care provider for further management of this issue. Return: Return to the ED should you begin to have confusion, abnormal behavior, aggression, violence, or personality changes, repeated vomiting, vision loss, numbness or weakness on one side of the body, difficulty standing due to dizziness, significantly worsening pain, or any other major concerns.

## 2020-09-22 NOTE — ED Triage Notes (Addendum)
Pt BIB EMS from [place]. Pt reports falling on her right shoulder and knee. Pt now endorses right shoulder and knee pain. Pt has hx of shoulder replacement. Pt reports falling on her face as well. Pt denies blood thinners.

## 2020-09-22 NOTE — ED Provider Notes (Signed)
[facility] DEPT Provider Note   CSN: [id number] Arrival date & time: 09/22/20  1701     History Chief Complaint  Patient presents with  . Fall    Stacy Ayers is a 79 y.o. female.  HPI      Stacy Ayers is a 79 y.o. female, with a history of asthma, right shoulder replacement, presenting to the ED following a fall that occurred around 3:30 PM this afternoon.  Patient states she was distracted while walking to answer her door, tripped, and fell to the floor, mostly on her right side. She complains of pain to the right shoulder and right knee, moderate to severe, throbbing, nonradiating from these locations. She also states she struck her face and head on the floor. She denies anticoagulation. Denies LOC, nausea/vomiting, vision abnormalities, numbness, weakness, chest pain, shortness of breath, abdominal pain, other extremity/joint pain, neck/back pain, or any other complaints.   Past Medical History:  Diagnosis Date  . Abnormal liver function tests   . Allergy   . Anxiety   . Arthritis    knees, lower back   . Asthma   . Cataract    left eye growing cataract   . Depression   . Diabetes mellitus    had gastric bypass DM resolved-   . Diverticulosis   . GERD (gastroesophageal reflux disease)   . Headache    Previous migraines  . Heart murmur   . History of colon polyps    adenomatous  . Hyperlipidemia    past hx- resloved after gastric bypass   . Hypertension   . Hypothyroidism   . IBS (irritable bowel syndrome)   . Iron deficiency anemia   . Osteoporosis     Patient Active Problem List   Diagnosis Date Noted  . Viral upper respiratory tract infection 11/14/2018  . Dysuria 05/03/2018  . Seasonal allergies 08/11/2015  . Alcohol dependence ([place]) 08/11/2015  . CTS (carpal tunnel syndrome) 08/11/2015  . Allergic rhinitis 12/19/2013  . B12 deficiency 05/22/2013  . Left foot pain 09/06/2012  . Depression with anxiety  04/05/2012  . PERSONAL HISTORY OF COLONIC POLYPS 01/18/2011  . SPONTANEOUS ECCHYMOSES 10/13/2010  . DIARRHEA, CHRONIC 10/13/2010  . PHARYNGITIS, CHRONIC 02/16/2010  . Hypothyroidism 01/14/2010  . BARIATRIC SURGERY STATUS 02/25/2009  . Vitamin D deficiency 09/10/2008  . VARICOSE VEINS, LOWER EXTREMITIES 09/10/2008  . PULMONARY NODULE 08/14/2008  . FATIGUE 08/14/2008  . MUSCLE PAIN 06/14/2007  . BLADDER PAIN 06/14/2007  . COLONIC POLYPS 03/14/2007  . History of diet-controlled diabetes 03/14/2007  . Hyperlipidemia LDL goal <100 03/14/2007  . ANEMIA-IRON DEFICIENCY 03/14/2007  . Essential hypertension 03/14/2007  . 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 EDEMA, LARYNX 03/14/2007  . ASTHMA 03/14/2007  . Esophageal reflux 03/14/2007  . LIVER FUNCTION TESTS, ABNORMAL, HX OF 03/14/2007  . TONSILLECTOMY AND ADENOIDECTOMY, HX OF 03/14/2007    Past Surgical History:  Procedure Laterality Date  . ABDOMINAL HYSTERECTOMY    . ABDOMINAL HYSTERECTOMY    . APPENDECTOMY    . BLADDER REPAIR    . BREAST BIOPSY Right    needle core biopsy, benign  . CATARACT EXTRACTION Left   . CHOLECYSTECTOMY    . COLONOSCOPY    . CORONARY ANGIOPLASTY    . GASTRIC BYPASS    . POLYPECTOMY    . REVERSE SHOULDER ARTHROPLASTY Right 07/15/2020   Procedure: REVERSE SHOULDER ARTHROPLASTY;  Surgeon: Justice Britain, MD;  Location: [facility];  Service: Orthopedics;  Laterality: Right;  151min  . TONSILLECTOMY AND ADENOIDECTOMY       OB History   No obstetric history on file.     Family History  Problem Relation Age of Onset  . Diabetes Sister   . Heart disease Maternal Grandmother   . Angina Maternal Grandmother   . Stroke Mother   . Suicidality Maternal Grandfather   . Cancer Paternal Grandmother   . Obesity Father   . Colon cancer Neg Hx   . Colon polyps Neg Hx   . Esophageal cancer Neg Hx   . Rectal cancer Neg Hx   . Stomach cancer Neg Hx   . Pancreatic cancer Neg Hx   . Liver disease Neg Hx      Social History   Tobacco Use  . Smoking status: Former Smoker    Packs/day: 0.30    Years: 2.00    Pack years: 0.60    Types: Cigarettes    Quit date: 07/11/1962    Years since quitting: 58.2  . Smokeless tobacco: Never Used  Vaping Use  . Vaping Use: Never used  Substance Use Topics  . Alcohol use: Not Currently    Comment: None in 7 weeks  . Drug use: No    Home Medications Prior to Admission medications   Medication Sig Start Date End Date Taking? Authorizing Provider  acetaminophen (TYLENOL) 650 MG CR tablet Take 650 mg by mouth every 8 (eight) hours as needed for pain.    [provider]  cholecalciferol (VITAMIN D) 1000 UNITS tablet Take 1,000 Units by mouth daily.      [provider]  cyclobenzaprine (FLEXERIL) 10 MG tablet Take 1 tablet (10 mg total) by mouth 3 (three) times daily as needed for muscle spasms. 07/15/20   Shuford, Olivia Mackie, PA-C  denosumab (PROLIA) 60 MG/ML SOSY injection Inject 60 mg into the skin every 6 (six) months. 07/18/20   Mariea Clonts, Tiffany L, DO  docusate sodium (COLACE) 100 MG capsule Take 100 mg by mouth 2 (two) times daily as needed for mild constipation.    [provider]  fluticasone (FLONASE) 50 MCG/ACT nasal spray USE 2 SPRAYS IN EACH NOSTRIL ONCE DAILY 03/30/19   Carollee Herter, Alferd Apa, DO  levothyroxine (SYNTHROID) 137 MCG tablet Take 1 tablet (137 mcg total) by mouth M,W,F. Take 150 mcg all other days. 09/19/20   [name], DO  levothyroxine (SYNTHROID) 150 MCG tablet Take 1 tablet (150 mcg total) by mouth daily before breakfast. 09/19/20   Carollee Herter, Alferd Apa, DO  loperamide (IMODIUM) 2 MG capsule Take 2 mg by mouth as needed for diarrhea or loose stools.     [provider]  loratadine (CLARITIN) 10 MG tablet TAKE 1 TABLET ONCE DAILY. 06/13/20   Roma Schanz R, DO  losartan (COZAAR) 50 MG tablet TAKE 1 TABLET ONCE DAILY. 08/11/20   [name], DO  ondansetron (ZOFRAN) 4 MG tablet  Take 1 tablet (4 mg total) by mouth every 8 (eight) hours as needed for nausea or vomiting. 07/15/20   Shuford, Olivia Mackie, PA-C  pantoprazole (PROTONIX) 40 MG tablet Take 1 tablet (40 mg total) by mouth daily. 09/19/20   [name], DO  Probiotic Product (PROBIOTIC DAILY) CAPS Take 1 capsule by mouth daily.    [provider]  traMADol (ULTRAM) 50 MG tablet Take 1 tablet (50 mg total) by mouth every 6 (six) hours as needed for severe pain. 09/22/20   Cyleigh Massaro, Helane Gunther, PA-C  venlafaxine XR (EFFEXOR XR) 75 MG 24 hr capsule Take 1 capsule (75 mg total) by mouth daily with breakfast. 01/31/20   Carollee Herter, Alferd Apa, DO  vitamin B-12 (CYANOCOBALAMIN) 1000 MCG tablet Take 1,000 mcg by mouth daily.    [provider]    Allergies    Sulfonamide derivatives, Cocaine, Montelukast sodium, Penicillins, Pioglitazone, Rosiglitazone maleate, Secobarbital sodium, Codeine, and Doxycycline  Review of Systems   Review of Systems  Constitutional: Negative for diaphoresis.  HENT: Positive for facial swelling.   Eyes: Negative for visual disturbance.  Respiratory: Negative for cough and shortness of breath.   Cardiovascular: Negative for chest pain.  Gastrointestinal: Negative for abdominal pain, nausea and vomiting.  Musculoskeletal: Positive for arthralgias. Negative for back pain and neck pain.  Skin: Positive for wound.  Neurological: Negative for dizziness, syncope, weakness, numbness and headaches.  All other systems reviewed and are negative.   Physical Exam Updated Vital Signs BP 136/87 (BP Location: Left Arm)   Pulse 83   Temp 98.2 F (36.8 C) (Oral)   Resp 16   SpO2 94%   Physical Exam Vitals and nursing note reviewed.  Constitutional:      General: She is not in acute distress.    Appearance: She is well-developed. She is not diaphoretic.  HENT:     Head: Normocephalic.     Comments: The head and face were examined without noted evidence of injury other than those  specifically mentioned.    Nose:     Comments: Swelling and tenderness to the bridge of the nose without noted deformity or instability. Nares patent without evidence of septal hematoma or hemorrhage.    Mouth/Throat:     Mouth: Mucous membranes are moist.     Pharynx: Oropharynx is clear.     Comments: Dentition appears to be intact and stable.  No noted area of intraoral swelling.  No trismus or noted abnormal phonation.  Mouth opening to at least 3 finger widths.  Handles oral secretions without difficulty.   No mandible pain, tenderness, swelling, or deformity. Eyes:     Extraocular Movements: Extraocular movements intact.     Conjunctiva/sclera: Conjunctivae normal.     Pupils: Pupils are equal, round, and reactive to light.     Comments: No difficulty or pain with EOMs.  Cardiovascular:     Rate and Rhythm: Normal rate and regular rhythm.     Pulses: Normal pulses.          Radial pulses are 2+ on the right side and 2+ on the left side.       Posterior tibial pulses are 2+ on the right side and 2+ on the left side.     Heart sounds: Normal heart sounds.     Comments: Tactile temperature in the extremities appropriate and equal bilaterally. Pulmonary:     Effort: Pulmonary effort is normal. No respiratory distress.     Breath sounds: Normal breath sounds.  Abdominal:     Palpations: Abdomen is soft.     Tenderness: There is no abdominal tenderness. There is no guarding.  Musculoskeletal:     Cervical back: Normal range of motion and neck supple.     Right lower leg: No edema.     Left lower leg: No edema.     Comments: Pain in the right shoulder without noted swelling, deformity, or instability.  Pain with some range of motion to the right shoulder.  Patient states she has had pain and difficulty with  movement since her shoulder replacement.  Small skin tear to the right lateral elbow.  Full range of motion in the right elbow without tenderness, deformity, swelling, or  instability.  Right knee with pain, tenderness, and swelling.  No noted deformity or instability.  Pain with range of motion of the right knee. No tenderness, swelling, deformity, or evidence of injury to the rest of the right lower extremity or to the left upper extremity. Small abrasion to the left heel.  Otherwise no evidence of other injury to the left lower extremity.  No midline spinal tenderness.   Skin:    General: Skin is warm and dry.  Neurological:     Mental Status: She is alert and oriented to person, place, and time.     Comments: No noted acute cognitive deficit. Sensation grossly intact to light touch in the extremities.   Grip strengths equal bilaterally.   Strength 5/5 in all extremities.  Coordination intact.  Cranial nerves III-XII grossly intact.  Handles oral secretions without noted difficulty.  No noted phonation or speech deficit. No facial droop.   Psychiatric:        Mood and Affect: Mood and affect normal.        Speech: Speech normal.        Behavior: Behavior normal.     ED Results / Procedures / Treatments   Labs (all labs ordered are listed, but only abnormal results are displayed) Labs Reviewed - No data to display  EKG None  Radiology DG Shoulder Right  Result Date: 09/22/2020 CLINICAL DATA:  Fall EXAM: RIGHT SHOULDER - 2+ VIEW COMPARISON:  None. FINDINGS: Mild AC joint degenerative change. Patient is status post reverse right shoulder replacement. Alignment appears within normal limits. No fracture is seen. IMPRESSION: Status post reverse right shoulder replacement. No acute osseous abnormality. Electronically Signed   By: Donavan Foil M.D.   On: 09/22/2020 18:18   CT Head Wo Contrast  Result Date: 09/22/2020 CLINICAL DATA:  Fall onto face. EXAM: CT HEAD WITHOUT CONTRAST TECHNIQUE: Contiguous axial images were obtained from the base of the skull through the vertex without intravenous contrast. COMPARISON:  None. FINDINGS: Brain: Normal  for age atrophy. No intracranial hemorrhage, mass effect, or midline shift. No hydrocephalus. The basilar cisterns are patent. Mild periventricular and deep white matter hypodensity consistent with chronic small vessel ischemia. No evidence of territorial infarct or acute ischemia. No extra-axial or intracranial fluid collection. Vascular: No hyperdense vessel or unexpected calcification. Skull: No fracture or focal lesion. Sinuses/Orbits: Assessed on concurrent face CT, reported separately. Other: None. IMPRESSION: 1. No acute intracranial abnormality. No skull fracture. 2. Mild chronic small vessel ischemia. Electronically Signed   By: Keith Rake M.D.   On: 09/22/2020 20:07   DG Knee Complete 4 Views Right  Result Date: 09/22/2020 CLINICAL DATA:  Fall with knee pain EXAM: RIGHT KNEE - COMPLETE 4+ VIEW COMPARISON:  None. FINDINGS: No acute displaced fracture or malalignment. Moderate tricompartment arthritis of the knee. Small moderate knee effusion. Joint space calcifications. IMPRESSION: 1. No acute osseous abnormality. 2. 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 Electronically Signed   By: Donavan Foil M.D.   On: 09/22/2020 18:19   CT Maxillofacial Wo Contrast  Result Date: 09/22/2020 CLINICAL DATA:  Fall onto face. EXAM: CT MAXILLOFACIAL WITHOUT CONTRAST TECHNIQUE: Multidetector CT imaging of the maxillofacial structures was performed. Multiplanar CT image reconstructions were also generated. COMPARISON:  None. FINDINGS: Osseous: No acute fracture of the nasal bone,  zygomatic arches, or mandibles. The temporomandibular joints are congruent. The nasal septum is midline. Dental fillings colles mild regional streak artifact. Intact pterygoid plates. Orbits: No orbital fracture. Both orbits and globes are intact. Bilateral lens extraction. Sinuses: No sinus fracture or fluid level. No significant mucosal thickening or inflammation. The mastoid air cells are  clear. Soft tissues: Negative. Limited intracranial: Assessed on concurrent head CT, reported separately. IMPRESSION: No facial bone fracture. Electronically Signed   By: Keith Rake M.D.   On: 09/22/2020 20:13    Procedures Procedures (including critical care time)  Medications Ordered in ED Medications  acetaminophen (TYLENOL) tablet 1,000 mg (1,000 mg Oral Given 09/22/20 1857)  traMADol (ULTRAM) tablet 50 mg (50 mg Oral Given 09/22/20 1857)    ED Course  I have reviewed the triage vital signs and the nursing notes.  Pertinent labs & imaging results that were available during my care of the patient were reviewed by me and considered in my medical decision making (see chart for details).  Clinical Course as of Sep 22 2142  Mon Sep 22, 4264  [id number] 79 year old female here after a fall striking her face and complaining of right knee and shoulder pain.  No loss of consciousness.  She is awake and alert nontoxic-appearing.  Imaging showing no acute.  Will make sure she can ambulate safely and discharged back to her family.   [MB]    Clinical Course User Index [MB] Hayden Rasmussen, MD   MDM Rules/Calculators/A&P                          Patient presents for evaluation following a mechanical fall. No focal neurologic deficits. Her symptoms improved with right shoulder sling and right knee sleeve for comfort.  She has the ability for close family member support at home.  She is able to stand and pivot. For pain management, she states she has previously taken tramadol without difficulty. Her pain significantly improved with tramadol here. The patient was given instructions for home care as well as return precautions. Patient voices understanding of these instructions, accepts the plan, and is comfortable with discharge.  I reviewed and interpreted the patient's radiological studies.  Findings and plan of care discussed with Ronnald Ramp, MD. Dr. Melina Copa personally evaluated and  examined this patient.  Vitals:   09/22/20 1712 09/22/20 1925 09/22/20 2113  BP: (!) 141/61 136/87 135/75  Pulse: 82 83 80  Resp: 18 16 16   Temp: 98.2 F (36.8 C)  98.4 F (36.9 C)  TempSrc: Oral  Oral  SpO2: 98% 94% 97%    Final Clinical Impression(s) / ED Diagnoses Final diagnoses:  Fall, initial encounter  Facial injury, initial encounter  Injury of right shoulder, initial encounter  Injury of right knee, initial encounter    Rx / DC Orders ED Discharge Orders         Ordered    traMADol (ULTRAM) 50 MG tablet  Every 6 hours PRN        09/22/20 2104           Lorayne Bender, PA-C 09/22/20 2147    Hayden Rasmussen, MD 09/23/20 1058

## 2020-09-29 DIAGNOSIS — Z96611 Presence of right artificial shoulder joint: Secondary | ICD-10-CM | POA: Diagnosis not present

## 2020-09-29 DIAGNOSIS — Z96619 Presence of unspecified artificial shoulder joint: Secondary | ICD-10-CM | POA: Diagnosis not present

## 2020-09-29 DIAGNOSIS — Z471 Aftercare following joint replacement surgery: Secondary | ICD-10-CM | POA: Diagnosis not present

## 2020-10-01 DIAGNOSIS — R278 Other lack of coordination: Secondary | ICD-10-CM | POA: Diagnosis not present

## 2020-10-01 DIAGNOSIS — M75111 Incomplete rotator cuff tear or rupture of right shoulder, not specified as traumatic: Secondary | ICD-10-CM | POA: Diagnosis not present

## 2020-10-01 DIAGNOSIS — Z471 Aftercare following joint replacement surgery: Secondary | ICD-10-CM | POA: Diagnosis not present

## 2020-10-01 DIAGNOSIS — M170 Bilateral primary osteoarthritis of knee: Secondary | ICD-10-CM | POA: Diagnosis not present

## 2020-10-01 DIAGNOSIS — R600 Localized edema: Secondary | ICD-10-CM | POA: Diagnosis not present

## 2020-10-01 DIAGNOSIS — S8001XA Contusion of right knee, initial encounter: Secondary | ICD-10-CM | POA: Diagnosis not present

## 2020-10-01 DIAGNOSIS — M1712 Unilateral primary osteoarthritis, left knee: Secondary | ICD-10-CM | POA: Diagnosis not present

## 2020-10-01 DIAGNOSIS — M25511 Pain in right shoulder: Secondary | ICD-10-CM | POA: Diagnosis not present

## 2020-10-01 DIAGNOSIS — M1711 Unilateral primary osteoarthritis, right knee: Secondary | ICD-10-CM | POA: Insufficient documentation

## 2020-10-03 DIAGNOSIS — M75111 Incomplete rotator cuff tear or rupture of right shoulder, not specified as traumatic: Secondary | ICD-10-CM | POA: Diagnosis not present

## 2020-10-03 DIAGNOSIS — R278 Other lack of coordination: Secondary | ICD-10-CM | POA: Diagnosis not present

## 2020-10-03 DIAGNOSIS — M25511 Pain in right shoulder: Secondary | ICD-10-CM | POA: Diagnosis not present

## 2020-10-03 DIAGNOSIS — R600 Localized edema: Secondary | ICD-10-CM | POA: Diagnosis not present

## 2020-10-03 DIAGNOSIS — Z471 Aftercare following joint replacement surgery: Secondary | ICD-10-CM | POA: Diagnosis not present

## 2020-10-06 DIAGNOSIS — M25511 Pain in right shoulder: Secondary | ICD-10-CM | POA: Diagnosis not present

## 2020-10-06 DIAGNOSIS — R600 Localized edema: Secondary | ICD-10-CM | POA: Diagnosis not present

## 2020-10-06 DIAGNOSIS — M75111 Incomplete rotator cuff tear or rupture of right shoulder, not specified as traumatic: Secondary | ICD-10-CM | POA: Diagnosis not present

## 2020-10-06 DIAGNOSIS — Z471 Aftercare following joint replacement surgery: Secondary | ICD-10-CM | POA: Diagnosis not present

## 2020-10-06 DIAGNOSIS — R278 Other lack of coordination: Secondary | ICD-10-CM | POA: Diagnosis not present

## 2020-10-08 DIAGNOSIS — M75111 Incomplete rotator cuff tear or rupture of right shoulder, not specified as traumatic: Secondary | ICD-10-CM | POA: Diagnosis not present

## 2020-10-08 DIAGNOSIS — M25511 Pain in right shoulder: Secondary | ICD-10-CM | POA: Diagnosis not present

## 2020-10-08 DIAGNOSIS — R600 Localized edema: Secondary | ICD-10-CM | POA: Diagnosis not present

## 2020-10-08 DIAGNOSIS — R278 Other lack of coordination: Secondary | ICD-10-CM | POA: Diagnosis not present

## 2020-10-08 DIAGNOSIS — Z471 Aftercare following joint replacement surgery: Secondary | ICD-10-CM | POA: Diagnosis not present

## 2020-10-13 DIAGNOSIS — M25511 Pain in right shoulder: Secondary | ICD-10-CM | POA: Diagnosis not present

## 2020-10-13 DIAGNOSIS — R278 Other lack of coordination: Secondary | ICD-10-CM | POA: Diagnosis not present

## 2020-10-13 DIAGNOSIS — Z471 Aftercare following joint replacement surgery: Secondary | ICD-10-CM | POA: Diagnosis not present

## 2020-10-13 DIAGNOSIS — R600 Localized edema: Secondary | ICD-10-CM | POA: Diagnosis not present

## 2020-10-13 DIAGNOSIS — M75111 Incomplete rotator cuff tear or rupture of right shoulder, not specified as traumatic: Secondary | ICD-10-CM | POA: Diagnosis not present

## 2020-10-16 DIAGNOSIS — R600 Localized edema: Secondary | ICD-10-CM | POA: Diagnosis not present

## 2020-10-16 DIAGNOSIS — M75111 Incomplete rotator cuff tear or rupture of right shoulder, not specified as traumatic: Secondary | ICD-10-CM | POA: Diagnosis not present

## 2020-10-16 DIAGNOSIS — Z471 Aftercare following joint replacement surgery: Secondary | ICD-10-CM | POA: Diagnosis not present

## 2020-10-16 DIAGNOSIS — M25511 Pain in right shoulder: Secondary | ICD-10-CM | POA: Diagnosis not present

## 2020-10-16 DIAGNOSIS — R278 Other lack of coordination: Secondary | ICD-10-CM | POA: Diagnosis not present

## 2020-10-17 DIAGNOSIS — M170 Bilateral primary osteoarthritis of knee: Secondary | ICD-10-CM | POA: Diagnosis not present

## 2020-10-17 DIAGNOSIS — M1711 Unilateral primary osteoarthritis, right knee: Secondary | ICD-10-CM | POA: Diagnosis not present

## 2020-10-17 DIAGNOSIS — M1712 Unilateral primary osteoarthritis, left knee: Secondary | ICD-10-CM | POA: Diagnosis not present

## 2020-10-20 DIAGNOSIS — M25511 Pain in right shoulder: Secondary | ICD-10-CM | POA: Diagnosis not present

## 2020-10-20 DIAGNOSIS — R278 Other lack of coordination: Secondary | ICD-10-CM | POA: Diagnosis not present

## 2020-10-20 DIAGNOSIS — M75111 Incomplete rotator cuff tear or rupture of right shoulder, not specified as traumatic: Secondary | ICD-10-CM | POA: Diagnosis not present

## 2020-10-20 DIAGNOSIS — R600 Localized edema: Secondary | ICD-10-CM | POA: Diagnosis not present

## 2020-10-20 DIAGNOSIS — Z471 Aftercare following joint replacement surgery: Secondary | ICD-10-CM | POA: Diagnosis not present

## 2020-10-22 DIAGNOSIS — R278 Other lack of coordination: Secondary | ICD-10-CM | POA: Diagnosis not present

## 2020-10-22 DIAGNOSIS — M75111 Incomplete rotator cuff tear or rupture of right shoulder, not specified as traumatic: Secondary | ICD-10-CM | POA: Diagnosis not present

## 2020-10-22 DIAGNOSIS — R600 Localized edema: Secondary | ICD-10-CM | POA: Diagnosis not present

## 2020-10-22 DIAGNOSIS — M25511 Pain in right shoulder: Secondary | ICD-10-CM | POA: Diagnosis not present

## 2020-10-22 DIAGNOSIS — Z471 Aftercare following joint replacement surgery: Secondary | ICD-10-CM | POA: Diagnosis not present

## 2020-10-27 DIAGNOSIS — R278 Other lack of coordination: Secondary | ICD-10-CM | POA: Diagnosis not present

## 2020-10-27 DIAGNOSIS — M25511 Pain in right shoulder: Secondary | ICD-10-CM | POA: Diagnosis not present

## 2020-10-27 DIAGNOSIS — M75111 Incomplete rotator cuff tear or rupture of right shoulder, not specified as traumatic: Secondary | ICD-10-CM | POA: Diagnosis not present

## 2020-10-27 DIAGNOSIS — Z471 Aftercare following joint replacement surgery: Secondary | ICD-10-CM | POA: Diagnosis not present

## 2020-10-27 DIAGNOSIS — R600 Localized edema: Secondary | ICD-10-CM | POA: Diagnosis not present

## 2020-10-29 ENCOUNTER — Other Ambulatory Visit: Payer: Self-pay | Admitting: Family Medicine

## 2020-10-29 DIAGNOSIS — Z471 Aftercare following joint replacement surgery: Secondary | ICD-10-CM | POA: Diagnosis not present

## 2020-10-29 DIAGNOSIS — M75111 Incomplete rotator cuff tear or rupture of right shoulder, not specified as traumatic: Secondary | ICD-10-CM | POA: Diagnosis not present

## 2020-10-29 DIAGNOSIS — R600 Localized edema: Secondary | ICD-10-CM | POA: Diagnosis not present

## 2020-10-29 DIAGNOSIS — M25511 Pain in right shoulder: Secondary | ICD-10-CM | POA: Diagnosis not present

## 2020-10-29 DIAGNOSIS — R278 Other lack of coordination: Secondary | ICD-10-CM | POA: Diagnosis not present

## 2020-10-29 DIAGNOSIS — K219 Gastro-esophageal reflux disease without esophagitis: Secondary | ICD-10-CM

## 2020-10-29 DIAGNOSIS — M63811 Disorders of muscle in diseases classified elsewhere, right shoulder: Secondary | ICD-10-CM | POA: Diagnosis not present

## 2020-11-06 ENCOUNTER — Encounter: Payer: Self-pay | Admitting: Family Medicine

## 2020-11-06 ENCOUNTER — Ambulatory Visit: Payer: Medicare PPO | Admitting: Family Medicine

## 2020-11-06 ENCOUNTER — Other Ambulatory Visit: Payer: Self-pay

## 2020-11-06 VITALS — BP 118/68 | HR 84 | Temp 98.5°F | Resp 18 | Ht 67.0 in | Wt 175.0 lb

## 2020-11-06 DIAGNOSIS — K219 Gastro-esophageal reflux disease without esophagitis: Secondary | ICD-10-CM

## 2020-11-06 DIAGNOSIS — H9201 Otalgia, right ear: Secondary | ICD-10-CM | POA: Diagnosis not present

## 2020-11-06 DIAGNOSIS — E039 Hypothyroidism, unspecified: Secondary | ICD-10-CM

## 2020-11-06 DIAGNOSIS — Z8639 Personal history of other endocrine, nutritional and metabolic disease: Secondary | ICD-10-CM | POA: Diagnosis not present

## 2020-11-06 DIAGNOSIS — E785 Hyperlipidemia, unspecified: Secondary | ICD-10-CM | POA: Diagnosis not present

## 2020-11-06 DIAGNOSIS — E119 Type 2 diabetes mellitus without complications: Secondary | ICD-10-CM

## 2020-11-06 DIAGNOSIS — I10 Essential (primary) hypertension: Secondary | ICD-10-CM | POA: Diagnosis not present

## 2020-11-06 DIAGNOSIS — H0012 Chalazion right lower eyelid: Secondary | ICD-10-CM | POA: Diagnosis not present

## 2020-11-06 LAB — COMPREHENSIVE METABOLIC PANEL
ALT: 11 U/L (ref 0–35)
AST: 14 U/L (ref 0–37)
Albumin: 4 g/dL (ref 3.5–5.2)
Alkaline Phosphatase: 66 U/L (ref 39–117)
BUN: 8 mg/dL (ref 6–23)
CO2: 31 mEq/L (ref 19–32)
Calcium: 9.5 mg/dL (ref 8.4–10.5)
Chloride: 102 mEq/L (ref 96–112)
Creatinine, Ser: 0.58 mg/dL (ref 0.40–1.20)
GFR: 87.27 mL/min (ref >60.00)
Glucose, Bld: 117 mg/dL — ABNORMAL HIGH (ref 70–99)
Potassium: 4.6 mEq/L (ref 3.5–5.1)
Sodium: 139 mEq/L (ref 135–145)
Total Bilirubin: 0.6 mg/dL (ref 0.2–1.2)
Total Protein: 6.3 g/dL (ref 6.0–8.3)

## 2020-11-06 LAB — HEMOGLOBIN A1C: Hgb A1c MFr Bld: 6.1 % (ref 4.6–6.5)

## 2020-11-06 LAB — LIPID PANEL
Cholesterol: 179 mg/dL (ref 0–200)
HDL: 42.2 mg/dL (ref >39.00)
LDL Cholesterol: 109 mg/dL — ABNORMAL HIGH (ref 0–99)
NonHDL: 136.92
Total CHOL/HDL Ratio: 4
Triglycerides: 139 mg/dL (ref 0.0–149.0)
VLDL: 27.8 mg/dL (ref 0.0–40.0)

## 2020-11-06 LAB — TSH: TSH: 0.1 u[IU]/mL — ABNORMAL LOW (ref 0.35–4.50)

## 2020-11-06 MED ORDER — LEVOTHYROXINE SODIUM 137 MCG PO TABS: ORAL_TABLET | ORAL | 3 refills | Status: DC

## 2020-11-06 MED ORDER — LEVOTHYROXINE SODIUM 150 MCG PO TABS: 150.0000 ug | ORAL_TABLET | Freq: Every day | ORAL | 3 refills | Status: DC

## 2020-11-06 MED ORDER — PANTOPRAZOLE SODIUM 40 MG PO TBEC: 40.0000 mg | DELAYED_RELEASE_TABLET | Freq: Every day | ORAL | Status: DC

## 2020-11-06 NOTE — Progress Notes (Signed)
Patient ID: Stacy Ayers, female    DOB: Mar 07, 1941  Age: 79 y.o. MRN: [id number]    Subjective:  Subjective  HPI Stacy Ayers presents for f/u thyroid and gerd.   She complaint of L ear pain a few weeks ago --- it seems better today.  She has been using antihistamine and flonase   Review of Systems  Constitutional: Negative for appetite change, diaphoresis, fatigue and unexpected weight change.  Eyes: Negative for pain, redness and visual disturbance.  Respiratory: Negative for cough, chest tightness, shortness of breath and wheezing.   Cardiovascular: Negative for chest pain, palpitations and leg swelling.  Endocrine: Negative for cold intolerance, heat intolerance, polydipsia, polyphagia and polyuria.  Genitourinary: Negative for difficulty urinating, dysuria and frequency.  Neurological: Negative for dizziness, light-headedness, numbness and headaches.    History Past Medical History:  Diagnosis Date  . Abnormal liver function tests   . Allergy   . Anxiety   . Arthritis    knees, lower back   . Asthma   . Cataract    left eye growing cataract   . Depression   . Diabetes mellitus    had gastric bypass DM resolved-   . Diverticulosis   . GERD (gastroesophageal reflux disease)   . Headache    Previous migraines  . Heart murmur   . History of colon polyps    adenomatous  . Hyperlipidemia    past hx- resloved after gastric bypass   . Hypertension   . Hypothyroidism   . IBS (irritable bowel syndrome)   . Iron deficiency anemia   . Osteoporosis     She has a past surgical history that includes Abdominal hysterectomy; Appendectomy; Gastric bypass; Coronary angioplasty; Cholecystectomy; Tonsillectomy and adenoidectomy; Bladder repair; Abdominal hysterectomy; Colonoscopy; Polypectomy; Cataract extraction (Left); Breast biopsy (Right); and Reverse shoulder arthroplasty (Right, 07/15/2020).   Her family history includes Angina in her maternal grandmother; Cancer in her  paternal grandmother; Diabetes in her sister; Heart disease in her maternal grandmother; Obesity in her father; Stroke in her mother; Suicidality in her maternal grandfather.She reports that she quit smoking about 58 years ago. Her smoking use included cigarettes. She has a 0.60 pack-year smoking history. She has never used smokeless tobacco. She reports previous alcohol use. She reports that she does not use drugs.  Current Outpatient Medications on File Prior to Visit  Medication Sig Dispense Refill  . acetaminophen (TYLENOL) 650 MG CR tablet Take 650 mg by mouth every 8 (eight) hours as needed for pain.    . cholecalciferol (VITAMIN D) 1000 UNITS tablet Take 1,000 Units by mouth daily.    . cyclobenzaprine (FLEXERIL) 10 MG tablet Take 1 tablet (10 mg total) by mouth 3 (three) times daily as needed for muscle spasms. 30 tablet 1  . denosumab (PROLIA) 60 MG/ML SOSY injection Inject 60 mg into the skin every 6 (six) months. 180 mL 1  . docusate sodium (COLACE) 100 MG capsule Take 100 mg by mouth 2 (two) times daily as needed for mild constipation.    . fluticasone (FLONASE) 50 MCG/ACT nasal spray USE 2 SPRAYS IN EACH NOSTRIL ONCE DAILY 16 g 5  . loperamide (IMODIUM) 2 MG capsule Take 2 mg by mouth as needed for diarrhea or loose stools.     Marland Kitchen loratadine (CLARITIN) 10 MG tablet TAKE 1 TABLET ONCE DAILY. 30 tablet 4  . losartan (COZAAR) 50 MG tablet TAKE 1 TABLET ONCE DAILY. 60 tablet 0  . ondansetron (ZOFRAN) 4 MG tablet  Take 1 tablet (4 mg total) by mouth every 8 (eight) hours as needed for nausea or vomiting. 10 tablet 0  . Probiotic Product (PROBIOTIC DAILY) CAPS Take 1 capsule by mouth daily.    Marland Kitchen venlafaxine XR (EFFEXOR XR) 75 MG 24 hr capsule Take 1 capsule (75 mg total) by mouth daily with breakfast. 90 capsule 3  . vitamin B-12 (CYANOCOBALAMIN) 1000 MCG tablet Take 1,000 mcg by mouth daily.     No current facility-administered medications on file prior to visit.     Objective:  Objective   Physical Exam Vitals and nursing note reviewed.  Constitutional:      Appearance: She is well-developed and well-nourished.  HENT:     Head: Normocephalic and atraumatic.     Right Ear: Ear canal and external ear normal. A middle ear effusion is present.     Left Ear: Tympanic membrane, ear canal and external ear normal.  Eyes:     Extraocular Movements: EOM normal.     Conjunctiva/sclera: Conjunctivae normal.  Neck:     Thyroid: No thyromegaly.     Vascular: No carotid bruit or JVD.  Cardiovascular:     Rate and Rhythm: Normal rate and regular rhythm.     Heart sounds: Normal heart sounds. No murmur heard.   Pulmonary:     Effort: Pulmonary effort is normal. No respiratory distress.     Breath sounds: Normal breath sounds. No wheezing or rales.  Chest:     Chest wall: No tenderness.  Musculoskeletal:        General: No edema.     Cervical back: Normal range of motion and neck supple.  Neurological:     Mental Status: She is alert and oriented to person, place, and time.  Psychiatric:        Mood and Affect: Mood and affect normal.    BP 118/68 (BP Location: Right Arm, Patient Position: Sitting, Cuff Size: Normal)   Pulse 84   Temp 98.5 F (36.9 C) (Oral)   Resp 18   Ht 5\' 7"  (1.702 m)   Wt 175 lb (79.4 kg)   SpO2 99%   BMI 27.41 kg/m  Wt Readings from Last 3 Encounters:  11/06/20 175 lb (79.4 kg)  07/16/20 178 lb 12.8 oz (81.1 kg)  07/15/20 173 lb 7 oz (78.7 kg)     Lab Results  Component Value Date   WBC 5.0 07/09/2020   HGB 11.4 (L) 07/09/2020   HCT 34.7 (L) 07/09/2020   PLT 208 07/09/2020   GLUCOSE 114 (H) 07/09/2020   CHOL 189 01/31/2020   TRIG 157.0 (H) 01/31/2020   HDL 60.40 01/31/2020   LDLDIRECT 147.0 05/28/2019   LDLCALC 97 01/31/2020   ALT 21 01/31/2020   AST 24 01/31/2020   NA 137 07/09/2020   K 4.6 07/09/2020   CL 102 07/09/2020   CREATININE 0.64 07/09/2020   BUN 10 07/09/2020   CO2 28 07/09/2020   TSH 0.16 (L) 06/11/2020   INR  Positive 04/01/2017   HGBA1C 5.2 07/09/2020    DG Shoulder Right  Result Date: 09/22/2020 CLINICAL DATA:  Fall EXAM: RIGHT SHOULDER - 2+ VIEW COMPARISON:  None. FINDINGS: Mild AC joint degenerative change. Patient is status post reverse right shoulder replacement. Alignment appears within normal limits. No fracture is seen. IMPRESSION: Status post reverse right shoulder replacement. No acute osseous abnormality. Electronically Signed   By: Donavan Foil M.D.   On: 09/22/2020 18:18   CT Head Wo Contrast  Result Date: 09/22/2020 CLINICAL DATA:  Fall onto face. EXAM: CT HEAD WITHOUT CONTRAST TECHNIQUE: Contiguous axial images were obtained from the base of the skull through the vertex without intravenous contrast. COMPARISON:  None. FINDINGS: Brain: Normal for age atrophy. No intracranial hemorrhage, mass effect, or midline shift. No hydrocephalus. The basilar cisterns are patent. Mild periventricular and deep white matter hypodensity consistent with chronic small vessel ischemia. No evidence of territorial infarct or acute ischemia. No extra-axial or intracranial fluid collection. Vascular: No hyperdense vessel or unexpected calcification. Skull: No fracture or focal lesion. Sinuses/Orbits: Assessed on concurrent face CT, reported separately. Other: None. IMPRESSION: 1. No acute intracranial abnormality. No skull fracture. 2. Mild chronic small vessel ischemia. Electronically Signed   By: Keith Rake M.D.   On: 09/22/2020 20:07   DG Knee Complete 4 Views Right  Result Date: 09/22/2020 CLINICAL DATA:  Fall with knee pain EXAM: RIGHT KNEE - COMPLETE 4+ VIEW COMPARISON:  None. FINDINGS: No acute displaced fracture or malalignment. Moderate tricompartment arthritis of the knee. Small moderate knee effusion. Joint space calcifications. IMPRESSION: 1. No acute osseous abnormality. 2. 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 Electronically Signed   By: Donavan Foil M.D.   On: 09/22/2020 18:19   CT Maxillofacial Wo Contrast  Result Date: 09/22/2020 CLINICAL DATA:  Fall onto face. EXAM: CT MAXILLOFACIAL WITHOUT CONTRAST TECHNIQUE: Multidetector CT imaging of the maxillofacial structures was performed. Multiplanar CT image reconstructions were also generated. COMPARISON:  None. FINDINGS: Osseous: No acute fracture of the nasal bone, zygomatic arches, or mandibles. The temporomandibular joints are congruent. The nasal septum is midline. Dental fillings colles mild regional streak artifact. Intact pterygoid plates. Orbits: No orbital fracture. Both orbits and globes are intact. Bilateral lens extraction. Sinuses: No sinus fracture or fluid level. No significant mucosal thickening or inflammation. The mastoid air cells are clear. Soft tissues: Negative. Limited intracranial: Assessed on concurrent head CT, reported separately. IMPRESSION: No facial bone fracture. Electronically Signed   By: Keith Rake M.D.   On: 09/22/2020 20:13     Assessment & Plan:  Plan  I have discontinued Demia A. Comunale "Mandy"'s traMADol. I am also having her maintain her loperamide, cholecalciferol, acetaminophen, fluticasone, venlafaxine XR, loratadine, vitamin B-12, Probiotic Daily, ondansetron, cyclobenzaprine, denosumab, docusate sodium, losartan, levothyroxine, levothyroxine, and pantoprazole.  Meds ordered this encounter  Medications  . levothyroxine (SYNTHROID) 137 MCG tablet    Sig: Take 1 tablet (137 mcg total) by mouth M,W,F. Take 150 mcg all other days.    Dispense:  90 tablet    Refill:  3  . levothyroxine (SYNTHROID) 150 MCG tablet    Sig: Take 1 tablet (150 mcg total) by mouth daily before breakfast.    Dispense:  90 tablet    Refill:  3    Requested drug refills are authorized, however, the patient needs further evaluation and/or laboratory testing before further refills are given. Ask her to make an appointment for this.  . pantoprazole (PROTONIX) 40 MG  tablet    Sig: Take 1 tablet (40 mg total) by mouth daily.    Dispense:  90 tablet    Refill:  03    Requested drug refills are authorized, however, the patient needs further evaluation and/or laboratory testing before further refills are given. Ask her to make an appointment for this.    Problem List Items Addressed This Visit      Unprioritized   Essential hypertension    Well controlled, no changes to  meds. Encouraged heart healthy diet such as the DASH diet and exercise as tolerated.       History of diet-controlled diabetes    Check labs today con't to watch simple sugars and starches      Hyperlipidemia LDL goal <100    Encouraged heart healthy diet, increase exercise, avoid trans fats, consider a krill oil cap daily      Hypothyroidism - Primary    Check labs       Relevant Medications   levothyroxine (SYNTHROID) 137 MCG tablet   levothyroxine (SYNTHROID) 150 MCG tablet   Other Relevant Orders   Lipid panel   TSH   Hemoglobin A1c   Comprehensive metabolic panel   Right ear pain    Other Visit Diagnoses    Gastroesophageal reflux disease       Relevant Medications   pantoprazole (PROTONIX) 40 MG tablet   Diet-controlled diabetes mellitus (HCC)       Relevant Orders   Lipid panel   TSH   Hemoglobin A1c   Comprehensive metabolic panel   Primary hypertension       Relevant Orders   Lipid panel   TSH   Hemoglobin A1c   Comprehensive metabolic panel      Follow-up: Return in about 6 months (around 05/07/2021), or if symptoms worsen or fail to improve, for annual exam, fasting.  [name], DO

## 2020-11-06 NOTE — Assessment & Plan Note (Signed)
Encouraged heart healthy diet, increase exercise, avoid trans fats, consider a krill oil cap daily 

## 2020-11-06 NOTE — Assessment & Plan Note (Signed)
Check labs today con't to watch simple sugars and starches   

## 2020-11-06 NOTE — Patient Instructions (Signed)
Hypothyroidism  Hypothyroidism is when the thyroid gland does not make enough of certain hormones (it is underactive). The thyroid gland is a small gland located in the lower front part of the neck, just in front of the windpipe (trachea). This gland makes hormones that help control how the body uses food for energy (metabolism) as well as how the heart and brain function. These hormones also play a role in keeping your bones strong. When the thyroid is underactive, it produces too little of the hormones thyroxine (T4) and triiodothyronine (T3). What are the causes? This condition may be caused by:  Hashimoto's disease. This is a disease in which the body's disease-fighting system (immune system) attacks the thyroid gland. This is the most common cause.  Viral infections.  Pregnancy.  Certain medicines.  Birth defects.  Past radiation treatments to the head or neck for cancer.  Past treatment with radioactive iodine.  Past exposure to radiation in the environment.  Past surgical removal of part or all of the thyroid.  Problems with a gland in the center of the brain (pituitary gland).  Lack of enough iodine in the diet. What increases the risk? You are more likely to develop this condition if:  You are female.  You have a family history of thyroid conditions.  You use a medicine called lithium.  You take medicines that affect the immune system (immunosuppressants). What are the signs or symptoms? Symptoms of this condition include:  Feeling as though you have no energy (lethargy).  Not being able to tolerate cold.  Weight gain that is not explained by a change in diet or exercise habits.  Lack of appetite.  Dry skin.  Coarse hair.  Menstrual irregularity.  Slowing of thought processes.  Constipation.  Sadness or depression. How is this diagnosed? This condition may be diagnosed based on:  Your symptoms, your medical history, and a physical exam.  Blood  tests. You may also have imaging tests, such as an ultrasound or MRI. How is this treated? This condition is treated with medicine that replaces the thyroid hormones that your body does not make. After you begin treatment, it may take several weeks for symptoms to go away. Follow these instructions at home:  Take over-the-counter and prescription medicines only as told by your health care provider.  If you start taking any new medicines, tell your health care provider.  Keep all follow-up visits as told by your health care provider. This is important. ? As your condition improves, your dosage of thyroid hormone medicine may change. ? You will need to have blood tests regularly so that your health care provider can monitor your condition. Contact a health care provider if:  Your symptoms do not get better with treatment.  You are taking thyroid replacement medicine and you: ? Sweat a lot. ? Have tremors. ? Feel anxious. ? Lose weight rapidly. ? Cannot tolerate heat. ? Have emotional swings. ? Have diarrhea. ? Feel weak. Get help right away if you have:  Chest pain.  An irregular heartbeat.  A rapid heartbeat.  Difficulty breathing. Summary  Hypothyroidism is when the thyroid gland does not make enough of certain hormones (it is underactive).  When the thyroid is underactive, it produces too little of the hormones thyroxine (T4) and triiodothyronine (T3).  The most common cause is Hashimoto's disease, a disease in which the body's disease-fighting system (immune system) attacks the thyroid gland. The condition can also be caused by viral infections, medicine, pregnancy, or past   radiation treatment to the head or neck.  Symptoms may include weight gain, dry skin, constipation, feeling as though you do not have energy, and not being able to tolerate cold.  This condition is treated with medicine to replace the thyroid hormones that your body does not make. This information  is not intended to replace advice given to you by your health care provider. Make sure you discuss any questions you have with your health care provider. Document Revised: 10/28/2017 Document Reviewed: 10/26/2017 Elsevier Patient Education  2020 [employer]  

## 2020-11-06 NOTE — Assessment & Plan Note (Signed)
Well controlled, no changes to meds. Encouraged heart healthy diet such as the DASH diet and exercise as tolerated.  °

## 2020-11-06 NOTE — Assessment & Plan Note (Signed)
Check labs 

## 2020-11-10 ENCOUNTER — Other Ambulatory Visit: Payer: Self-pay | Admitting: Family Medicine

## 2020-11-10 DIAGNOSIS — R600 Localized edema: Secondary | ICD-10-CM | POA: Diagnosis not present

## 2020-11-10 DIAGNOSIS — R278 Other lack of coordination: Secondary | ICD-10-CM | POA: Diagnosis not present

## 2020-11-10 DIAGNOSIS — M75111 Incomplete rotator cuff tear or rupture of right shoulder, not specified as traumatic: Secondary | ICD-10-CM | POA: Diagnosis not present

## 2020-11-10 DIAGNOSIS — M25511 Pain in right shoulder: Secondary | ICD-10-CM | POA: Diagnosis not present

## 2020-11-10 DIAGNOSIS — E039 Hypothyroidism, unspecified: Secondary | ICD-10-CM

## 2020-11-10 DIAGNOSIS — M63811 Disorders of muscle in diseases classified elsewhere, right shoulder: Secondary | ICD-10-CM | POA: Diagnosis not present

## 2020-11-10 DIAGNOSIS — Z471 Aftercare following joint replacement surgery: Secondary | ICD-10-CM | POA: Diagnosis not present

## 2020-11-11 ENCOUNTER — Other Ambulatory Visit: Payer: Self-pay | Admitting: Family Medicine

## 2020-11-11 DIAGNOSIS — H524 Presbyopia: Secondary | ICD-10-CM | POA: Diagnosis not present

## 2020-11-11 DIAGNOSIS — I10 Essential (primary) hypertension: Secondary | ICD-10-CM

## 2020-11-11 DIAGNOSIS — H5211 Myopia, right eye: Secondary | ICD-10-CM | POA: Diagnosis not present

## 2020-11-11 DIAGNOSIS — H00022 Hordeolum internum right lower eyelid: Secondary | ICD-10-CM | POA: Diagnosis not present

## 2020-11-11 DIAGNOSIS — H52222 Regular astigmatism, left eye: Secondary | ICD-10-CM | POA: Diagnosis not present

## 2020-11-13 DIAGNOSIS — M25511 Pain in right shoulder: Secondary | ICD-10-CM | POA: Diagnosis not present

## 2020-11-13 DIAGNOSIS — R278 Other lack of coordination: Secondary | ICD-10-CM | POA: Diagnosis not present

## 2020-11-13 DIAGNOSIS — M75111 Incomplete rotator cuff tear or rupture of right shoulder, not specified as traumatic: Secondary | ICD-10-CM | POA: Diagnosis not present

## 2020-11-13 DIAGNOSIS — M63811 Disorders of muscle in diseases classified elsewhere, right shoulder: Secondary | ICD-10-CM | POA: Diagnosis not present

## 2020-11-13 DIAGNOSIS — Z471 Aftercare following joint replacement surgery: Secondary | ICD-10-CM | POA: Diagnosis not present

## 2020-11-13 DIAGNOSIS — R600 Localized edema: Secondary | ICD-10-CM | POA: Diagnosis not present

## 2020-11-17 DIAGNOSIS — R278 Other lack of coordination: Secondary | ICD-10-CM | POA: Diagnosis not present

## 2020-11-17 DIAGNOSIS — R600 Localized edema: Secondary | ICD-10-CM | POA: Diagnosis not present

## 2020-11-17 DIAGNOSIS — M75111 Incomplete rotator cuff tear or rupture of right shoulder, not specified as traumatic: Secondary | ICD-10-CM | POA: Diagnosis not present

## 2020-11-17 DIAGNOSIS — M63811 Disorders of muscle in diseases classified elsewhere, right shoulder: Secondary | ICD-10-CM | POA: Diagnosis not present

## 2020-11-17 DIAGNOSIS — Z471 Aftercare following joint replacement surgery: Secondary | ICD-10-CM | POA: Diagnosis not present

## 2020-11-17 DIAGNOSIS — M25511 Pain in right shoulder: Secondary | ICD-10-CM | POA: Diagnosis not present

## 2020-11-19 DIAGNOSIS — H524 Presbyopia: Secondary | ICD-10-CM | POA: Diagnosis not present

## 2020-11-19 DIAGNOSIS — M75111 Incomplete rotator cuff tear or rupture of right shoulder, not specified as traumatic: Secondary | ICD-10-CM | POA: Diagnosis not present

## 2020-11-19 DIAGNOSIS — R278 Other lack of coordination: Secondary | ICD-10-CM | POA: Diagnosis not present

## 2020-11-19 DIAGNOSIS — H0012 Chalazion right lower eyelid: Secondary | ICD-10-CM | POA: Diagnosis not present

## 2020-11-19 DIAGNOSIS — H43813 Vitreous degeneration, bilateral: Secondary | ICD-10-CM | POA: Diagnosis not present

## 2020-11-19 DIAGNOSIS — Z471 Aftercare following joint replacement surgery: Secondary | ICD-10-CM | POA: Diagnosis not present

## 2020-11-19 DIAGNOSIS — R600 Localized edema: Secondary | ICD-10-CM | POA: Diagnosis not present

## 2020-11-19 DIAGNOSIS — M25511 Pain in right shoulder: Secondary | ICD-10-CM | POA: Diagnosis not present

## 2020-11-19 DIAGNOSIS — H52223 Regular astigmatism, bilateral: Secondary | ICD-10-CM | POA: Diagnosis not present

## 2020-11-19 DIAGNOSIS — M63811 Disorders of muscle in diseases classified elsewhere, right shoulder: Secondary | ICD-10-CM | POA: Diagnosis not present

## 2020-11-19 DIAGNOSIS — H5211 Myopia, right eye: Secondary | ICD-10-CM | POA: Diagnosis not present

## 2020-11-25 DIAGNOSIS — R278 Other lack of coordination: Secondary | ICD-10-CM | POA: Diagnosis not present

## 2020-11-25 DIAGNOSIS — R600 Localized edema: Secondary | ICD-10-CM | POA: Diagnosis not present

## 2020-11-25 DIAGNOSIS — M25511 Pain in right shoulder: Secondary | ICD-10-CM | POA: Diagnosis not present

## 2020-11-25 DIAGNOSIS — M75111 Incomplete rotator cuff tear or rupture of right shoulder, not specified as traumatic: Secondary | ICD-10-CM | POA: Diagnosis not present

## 2020-11-25 DIAGNOSIS — M63811 Disorders of muscle in diseases classified elsewhere, right shoulder: Secondary | ICD-10-CM | POA: Diagnosis not present

## 2020-11-25 DIAGNOSIS — Z471 Aftercare following joint replacement surgery: Secondary | ICD-10-CM | POA: Diagnosis not present

## 2020-11-27 DIAGNOSIS — M25511 Pain in right shoulder: Secondary | ICD-10-CM | POA: Diagnosis not present

## 2020-11-27 DIAGNOSIS — R600 Localized edema: Secondary | ICD-10-CM | POA: Diagnosis not present

## 2020-11-27 DIAGNOSIS — R278 Other lack of coordination: Secondary | ICD-10-CM | POA: Diagnosis not present

## 2020-11-27 DIAGNOSIS — Z471 Aftercare following joint replacement surgery: Secondary | ICD-10-CM | POA: Diagnosis not present

## 2020-11-27 DIAGNOSIS — M63811 Disorders of muscle in diseases classified elsewhere, right shoulder: Secondary | ICD-10-CM | POA: Diagnosis not present

## 2020-11-27 DIAGNOSIS — M75111 Incomplete rotator cuff tear or rupture of right shoulder, not specified as traumatic: Secondary | ICD-10-CM | POA: Diagnosis not present

## 2020-12-04 ENCOUNTER — Telehealth: Payer: Self-pay | Admitting: Family Medicine

## 2020-12-04 NOTE — Telephone Encounter (Signed)
CallerJanelle Floor -[facility]  Call Back @ [phone] Fax : [phone]  Per Janelle Floor , patient was placed in rehab(occupational therapy) for shoulder injury and was d/c b/c company changed their name. A new order was sent to our office for Dr Laury Axon to sign and return. Please Advise if we received the new order.

## 2020-12-08 NOTE — Telephone Encounter (Signed)
Have not received, called Delcie Roch and she will refaxed forms. -JMA

## 2020-12-11 NOTE — Telephone Encounter (Signed)
Forms received and faxed back on 12/09/2020. -JMA

## 2020-12-16 ENCOUNTER — Other Ambulatory Visit: Payer: Self-pay | Admitting: Family Medicine

## 2020-12-16 ENCOUNTER — Telehealth: Payer: Self-pay | Admitting: Family Medicine

## 2020-12-16 MED ORDER — ALBUTEROL SULFATE HFA 108 (90 BASE) MCG/ACT IN AERS
2.0000 | INHALATION_SPRAY | Freq: Four times a day (QID) | RESPIRATORY_TRACT | 3 refills | Status: DC | PRN
Start: 2020-12-16 — End: 2022-01-06

## 2020-12-16 NOTE — Telephone Encounter (Signed)
Patient states she need asthma medication inhaler (dont know the name of it)  [address] [place] [address]  Phone:  [phone] Fax:  [phone]

## 2020-12-16 NOTE — Telephone Encounter (Signed)
There is an albuterol inh in med hx --- I filled it

## 2020-12-16 NOTE — Telephone Encounter (Signed)
Pt made aware

## 2020-12-16 NOTE — Telephone Encounter (Signed)
I don't see an inhaler on her med list. Please advise

## 2020-12-18 ENCOUNTER — Encounter: Payer: Self-pay | Admitting: Family Medicine

## 2020-12-18 ENCOUNTER — Other Ambulatory Visit: Payer: Self-pay

## 2020-12-18 ENCOUNTER — Telehealth (INDEPENDENT_AMBULATORY_CARE_PROVIDER_SITE_OTHER): Payer: Medicare PPO | Admitting: Family Medicine

## 2020-12-18 DIAGNOSIS — U071 COVID-19: Secondary | ICD-10-CM | POA: Diagnosis not present

## 2020-12-18 DIAGNOSIS — J40 Bronchitis, not specified as acute or chronic: Secondary | ICD-10-CM | POA: Diagnosis not present

## 2020-12-18 MED ORDER — PREDNISONE 10 MG PO TABS: ORAL_TABLET | ORAL | 0 refills | Status: DC

## 2020-12-18 MED ORDER — PROMETHAZINE-DM 6.25-15 MG/5ML PO SYRP: 5.0000 mL | ORAL_SOLUTION | Freq: Four times a day (QID) | ORAL | 0 refills | Status: DC | PRN

## 2020-12-18 NOTE — Progress Notes (Signed)
Virtual Visit via Video Note  I connected with [name] on 12/18/20 at 11:00 AM EST by a video enabled telemedicine application and verified that I am speaking with the correct person using two identifiers.  Location: Patient: home alone  Provider: office    I discussed the limitations of evaluation and management by telemedicine and the availability of in person appointments. The patient expressed understanding and agreed to proceed.  History of Present Illness: Pt is home tested positive [facility]-- she was having wheezing , bodyaches, ear ache, runny nose and sneezing ----  + low grade fever 100.4  dry cough   Observations/Objective: There were no vitals filed for this visit.   Assessment and Plan: 1. Bronchitis due to COVID-19 virus pred taper  con't prn albuterol Cough med per orders If symptoms worsen-- we can refer to covid clinic  Pt is quarantining at home - predniSONE (DELTASONE) 10 MG tablet; TAKE 3 TABLETS PO QD FOR 3 DAYS THEN TAKE 2 TABLETS PO QD FOR 3 DAYS THEN TAKE 1 TABLET PO QD FOR 3 DAYS THEN TAKE 1/2 TAB PO QD FOR 3 DAYS  Dispense: 20 tablet; Refill: 0 - promethazine-dextromethorphan (PROMETHAZINE-DM) 6.25-15 MG/5ML syrup; Take 5 mLs by mouth 4 (four) times daily as needed.  Dispense: 118 mL; Refill: 0   Follow Up Instructions:    I discussed the assessment and treatment plan with the patient. The patient was provided an opportunity to ask questions and all were answered. The patient agreed with the plan and demonstrated an understanding of the instructions.   The patient was advised to call back or seek an in-person evaluation if the symptoms worsen or if the condition fails to improve as anticipated.  I provided 25 minutes of non-face-to-face time during this encounter.   [name], DO

## 2020-12-19 ENCOUNTER — Telehealth: Payer: Self-pay | Admitting: Family Medicine

## 2020-12-19 NOTE — Telephone Encounter (Signed)
Patient wants to know if she could increase promethazine-dextromethorpan. She still having running nose and sneezing a lot.

## 2020-12-22 ENCOUNTER — Other Ambulatory Visit: Payer: Self-pay | Admitting: Family Medicine

## 2020-12-22 MED ORDER — FLUTICASONE PROPIONATE 50 MCG/ACT NA SUSP: 2.0000 | Freq: Every day | NASAL | 5 refills | Status: DC

## 2020-12-22 NOTE — Addendum Note (Signed)
Addended by: Kem Boroughs D on: 12/22/2020 04:32 PM   Modules accepted: Orders

## 2020-12-22 NOTE — Telephone Encounter (Signed)
Qid is the max She can add antihistamine like zyrtec/ claritin and flonase --- all otc

## 2020-12-22 NOTE — Telephone Encounter (Signed)
Patient notified and she stated that she feels a little better.  She had an rx on file for flonase.  rx sent in for refill.

## 2021-01-05 DIAGNOSIS — H532 Diplopia: Secondary | ICD-10-CM | POA: Diagnosis not present

## 2021-01-13 DIAGNOSIS — M1712 Unilateral primary osteoarthritis, left knee: Secondary | ICD-10-CM | POA: Diagnosis not present

## 2021-01-26 DIAGNOSIS — Z96611 Presence of right artificial shoulder joint: Secondary | ICD-10-CM | POA: Diagnosis not present

## 2021-01-27 DIAGNOSIS — R278 Other lack of coordination: Secondary | ICD-10-CM | POA: Diagnosis not present

## 2021-01-27 DIAGNOSIS — M63811 Disorders of muscle in diseases classified elsewhere, right shoulder: Secondary | ICD-10-CM | POA: Diagnosis not present

## 2021-01-27 DIAGNOSIS — Z96611 Presence of right artificial shoulder joint: Secondary | ICD-10-CM | POA: Diagnosis not present

## 2021-01-27 DIAGNOSIS — M25511 Pain in right shoulder: Secondary | ICD-10-CM | POA: Diagnosis not present

## 2021-01-27 DIAGNOSIS — M75111 Incomplete rotator cuff tear or rupture of right shoulder, not specified as traumatic: Secondary | ICD-10-CM | POA: Diagnosis not present

## 2021-01-29 DIAGNOSIS — M75111 Incomplete rotator cuff tear or rupture of right shoulder, not specified as traumatic: Secondary | ICD-10-CM | POA: Diagnosis not present

## 2021-01-29 DIAGNOSIS — Z96611 Presence of right artificial shoulder joint: Secondary | ICD-10-CM | POA: Diagnosis not present

## 2021-01-29 DIAGNOSIS — R278 Other lack of coordination: Secondary | ICD-10-CM | POA: Diagnosis not present

## 2021-01-29 DIAGNOSIS — M25511 Pain in right shoulder: Secondary | ICD-10-CM | POA: Diagnosis not present

## 2021-01-29 DIAGNOSIS — M63811 Disorders of muscle in diseases classified elsewhere, right shoulder: Secondary | ICD-10-CM | POA: Diagnosis not present

## 2021-02-02 DIAGNOSIS — L738 Other specified follicular disorders: Secondary | ICD-10-CM | POA: Diagnosis not present

## 2021-02-02 DIAGNOSIS — L821 Other seborrheic keratosis: Secondary | ICD-10-CM | POA: Diagnosis not present

## 2021-02-02 DIAGNOSIS — L570 Actinic keratosis: Secondary | ICD-10-CM | POA: Diagnosis not present

## 2021-02-02 DIAGNOSIS — D1801 Hemangioma of skin and subcutaneous tissue: Secondary | ICD-10-CM | POA: Diagnosis not present

## 2021-02-03 DIAGNOSIS — M63811 Disorders of muscle in diseases classified elsewhere, right shoulder: Secondary | ICD-10-CM | POA: Diagnosis not present

## 2021-02-03 DIAGNOSIS — R278 Other lack of coordination: Secondary | ICD-10-CM | POA: Diagnosis not present

## 2021-02-03 DIAGNOSIS — M75111 Incomplete rotator cuff tear or rupture of right shoulder, not specified as traumatic: Secondary | ICD-10-CM | POA: Diagnosis not present

## 2021-02-03 DIAGNOSIS — Z96611 Presence of right artificial shoulder joint: Secondary | ICD-10-CM | POA: Diagnosis not present

## 2021-02-03 DIAGNOSIS — M25511 Pain in right shoulder: Secondary | ICD-10-CM | POA: Diagnosis not present

## 2021-02-15 IMAGING — CR DG KNEE COMPLETE 4+V*R*
4 series · 4 of 4 positions shown · non-contrast
Comparison: None.

CLINICAL DATA: Fall with knee pain

EXAM:
RIGHT KNEE - COMPLETE 4+ VIEW

[t knee ap right]
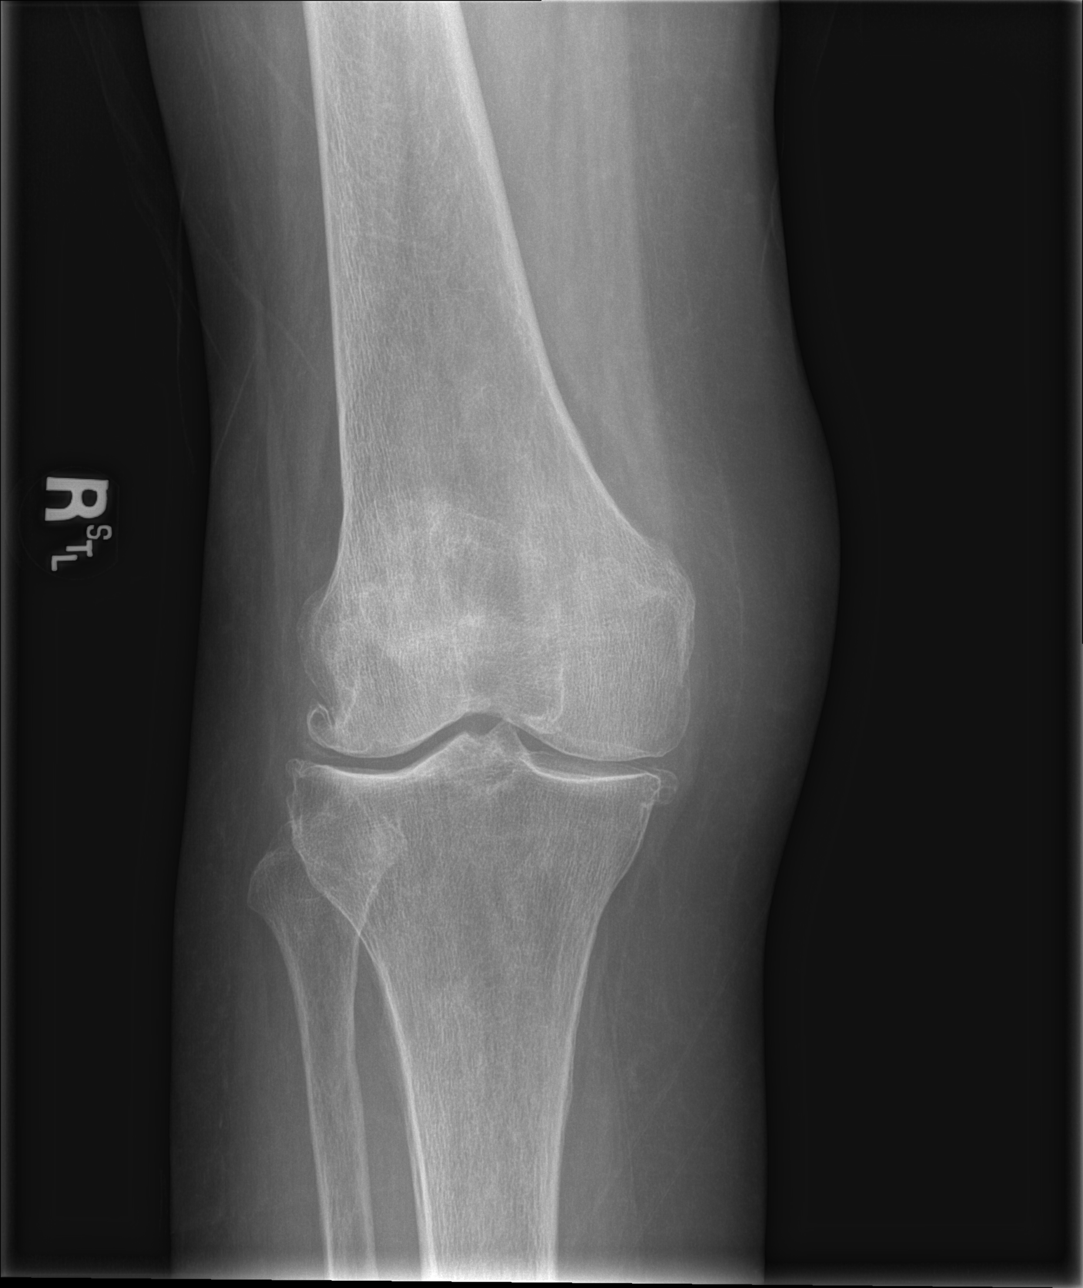

[t knee obl right (1 of 2)]
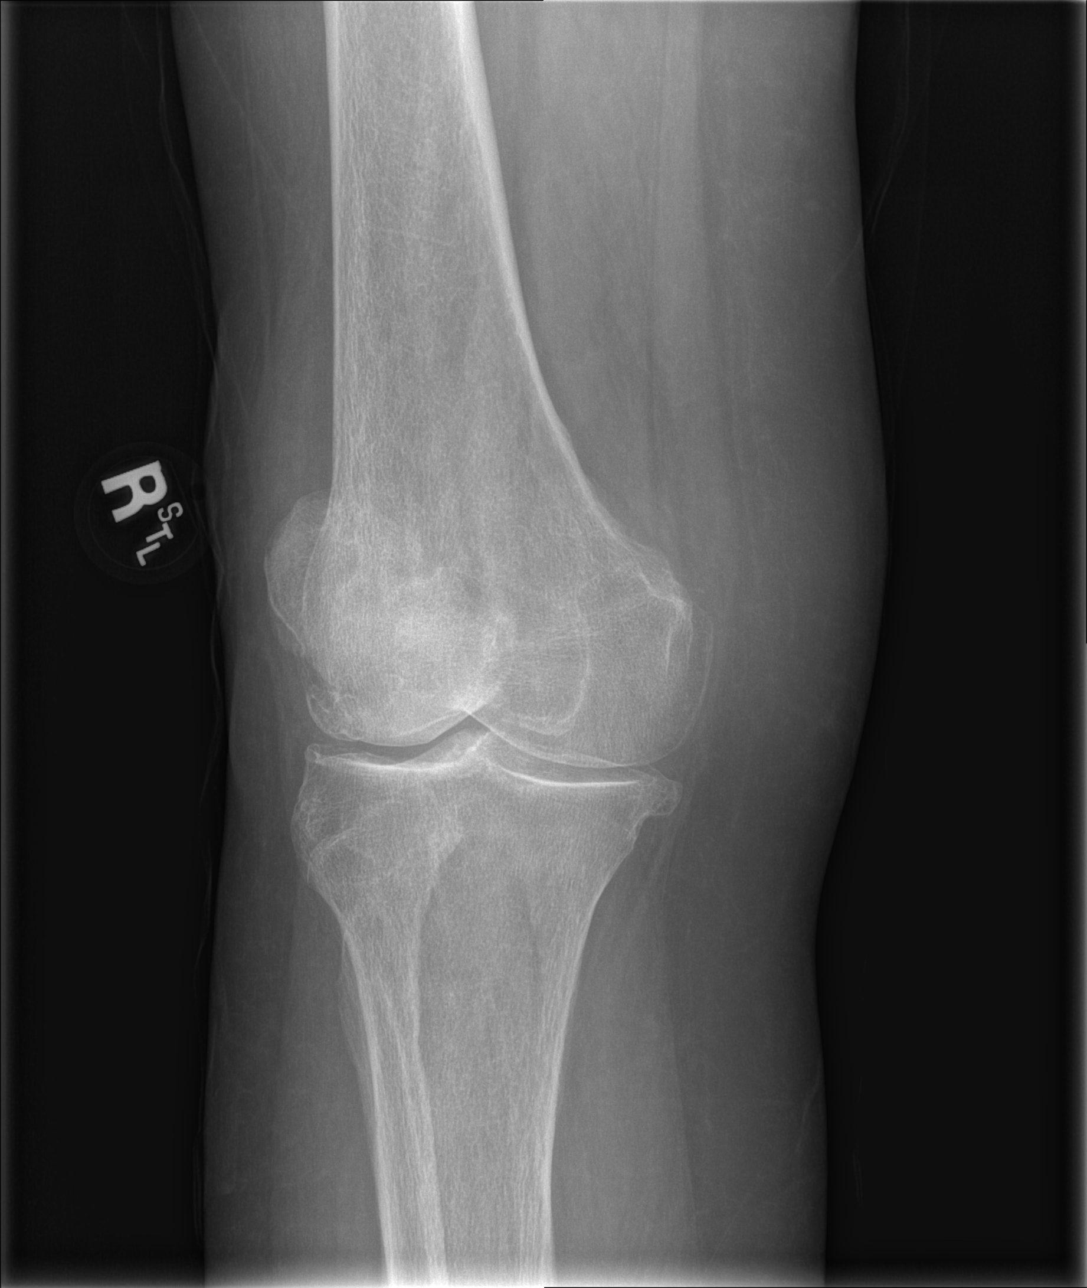

[t knee obl right (2 of 2)]
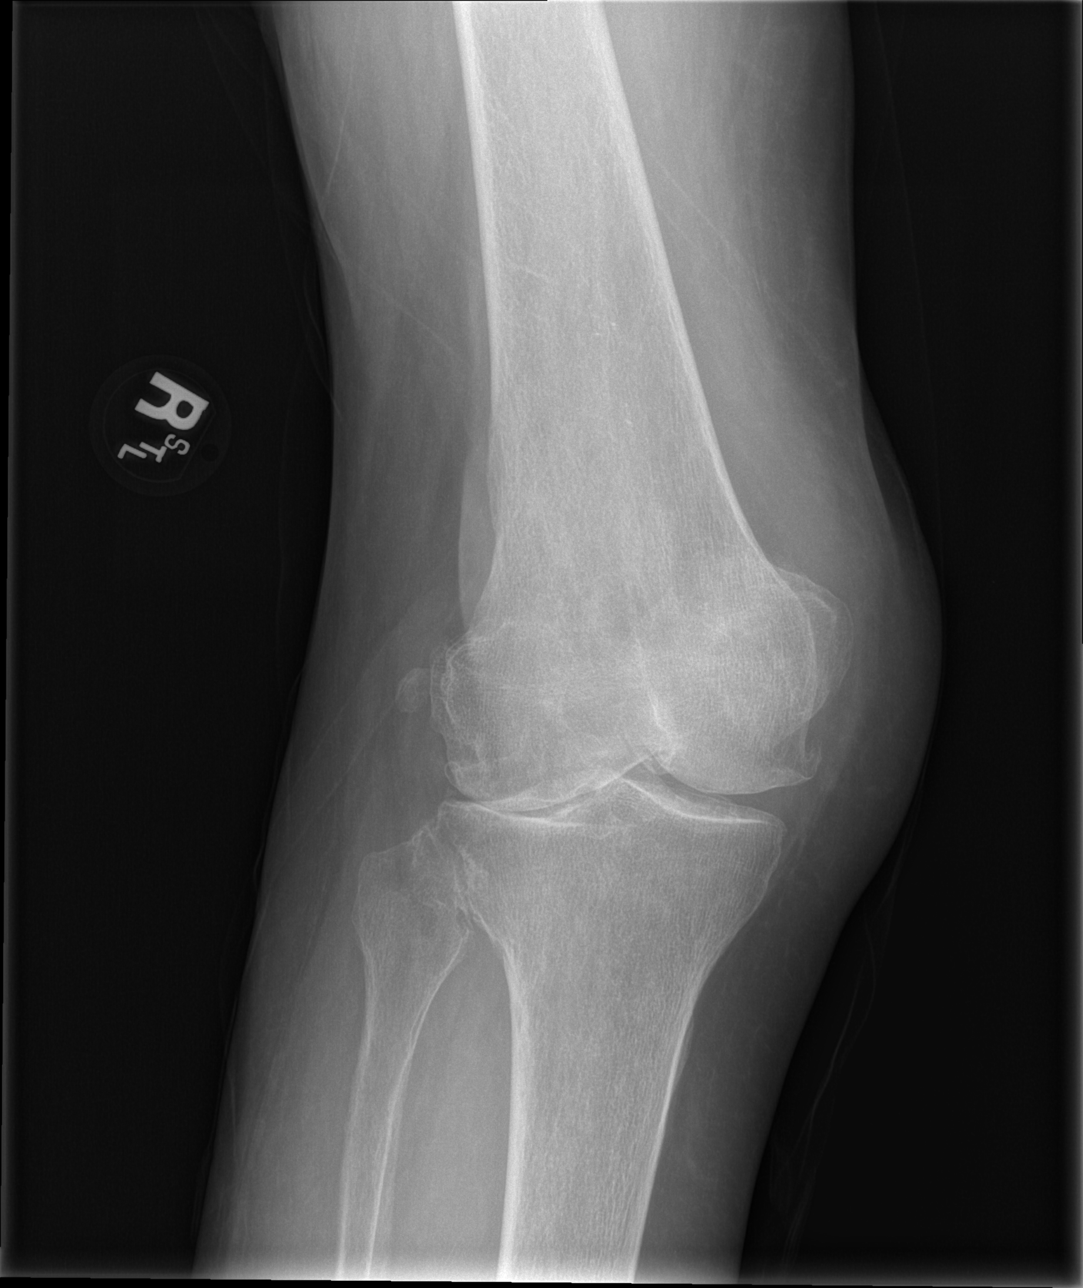

[x knee lat right]
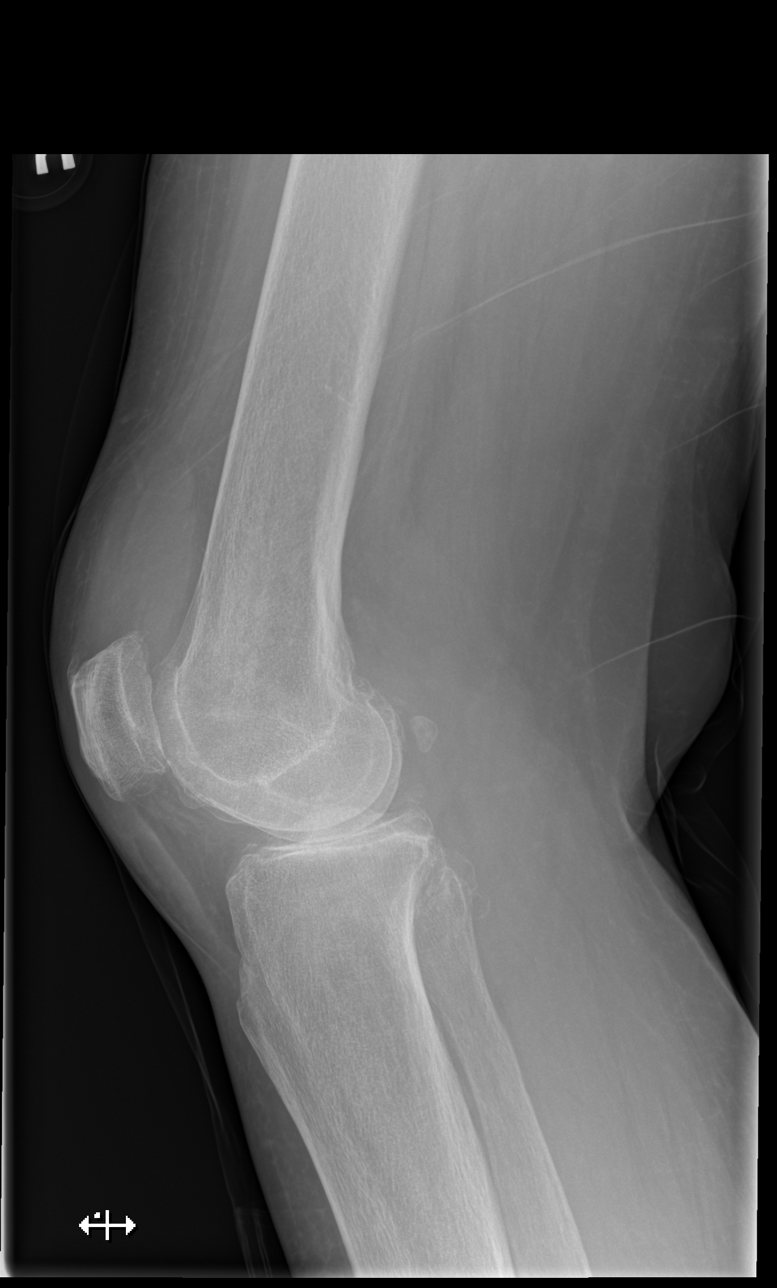

[4 of 4 positions shown; findings below may reference images not displayed]

FINDINGS: No acute displaced fracture or malalignment. Moderate tricompartment
arthritis of the knee. Small moderate knee effusion. Joint space
calcifications.
IMPRESSION: 1. No acute osseous abnormality.
2. Tricompartment arthritis with small to moderate knee effusion.
Chondrocalcinosis.

## 2021-02-15 IMAGING — CT CT HEAD W/O CM
3 series · 16 of 47 positions shown, 19 images · non-contrast
Comparison: None.

CLINICAL DATA: Fall onto face.

EXAM:
CT HEAD WITHOUT CONTRAST
TECHNIQUE: Contiguous axial images were obtained from the base of the skull
through the vertex without intravenous contrast.

[Series 3: head wo · axial · 0.43mm/px · z∈[-99,+36]mm · 10 of 33 slices shown, 13 images]
[im 3/33  brain]
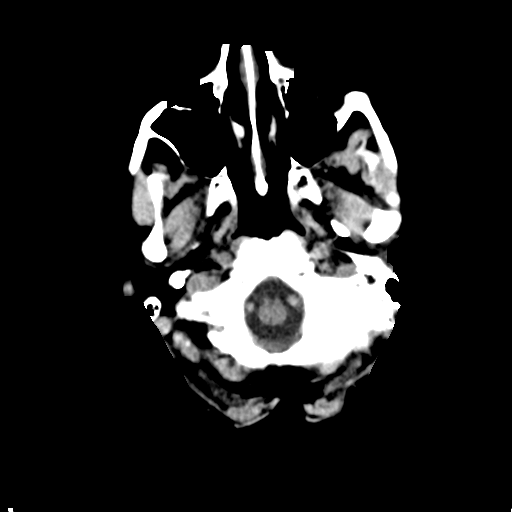
[im 3/33  bone]
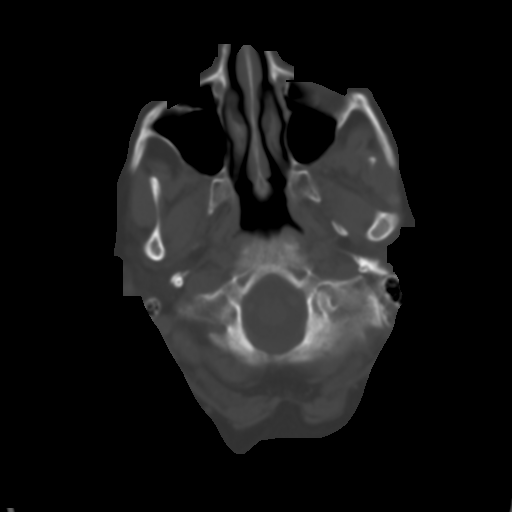
[im 6/33  brain]
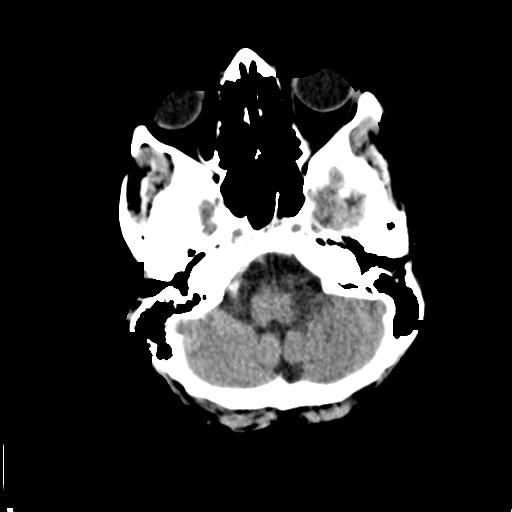
[im 9/33  brain]
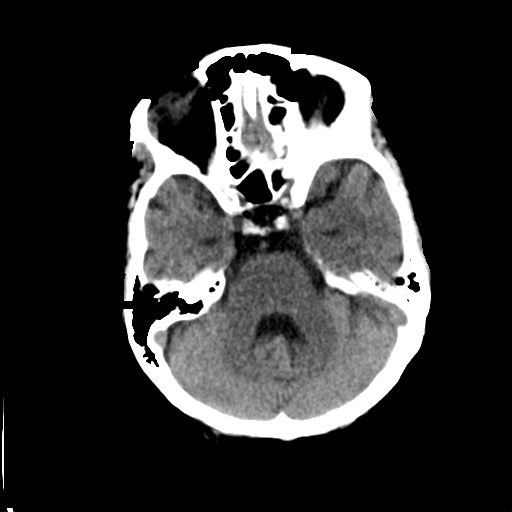
[im 12/33  brain]
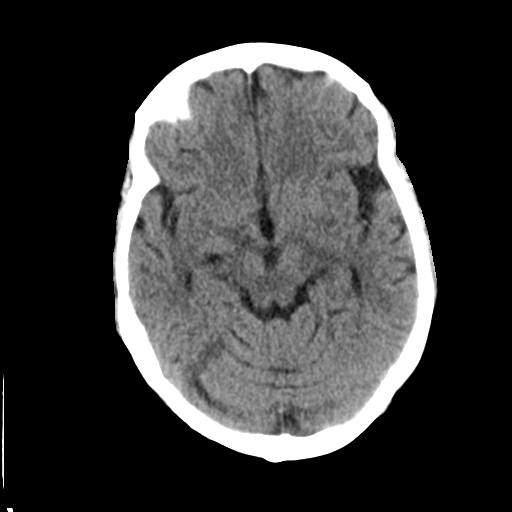
[im 15/33  brain]
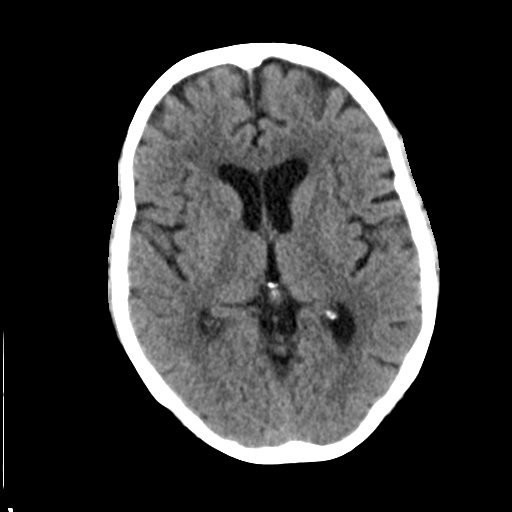
[im 15/33  bone]
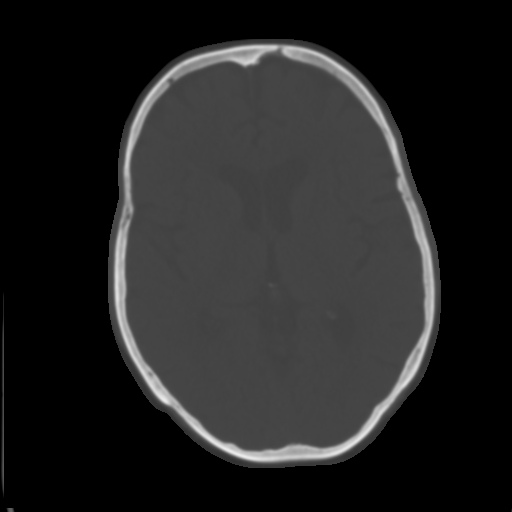
[im 18/33  brain]
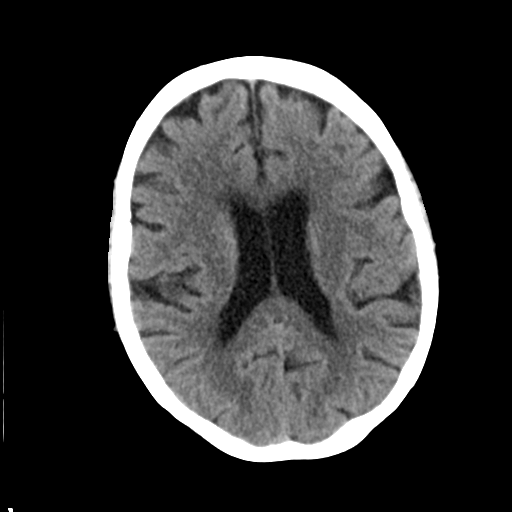
[im 21/33  brain]
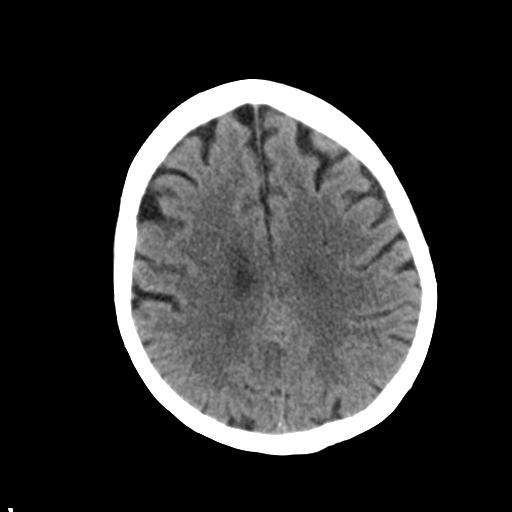
[im 25/33  brain]
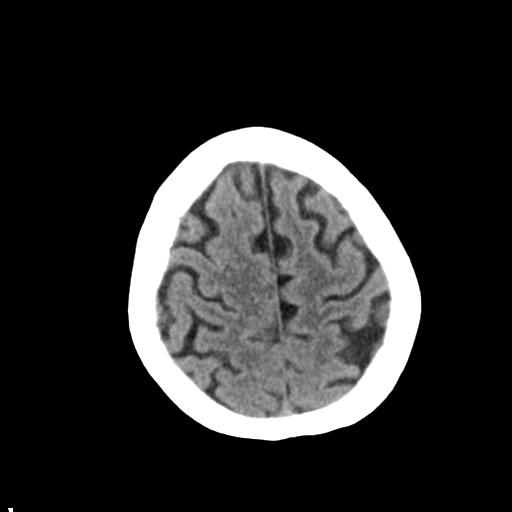
[im 27/33  brain]
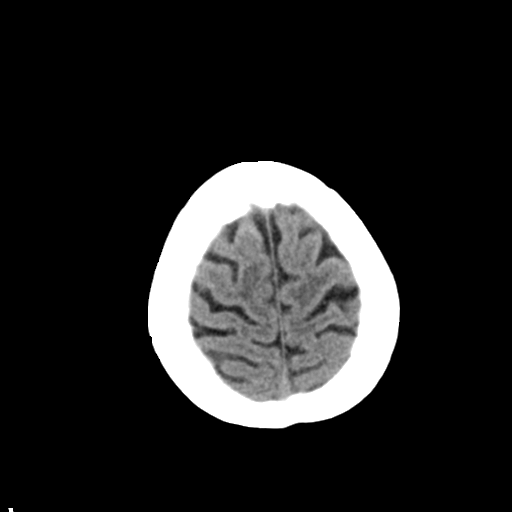
[im 27/33  bone]
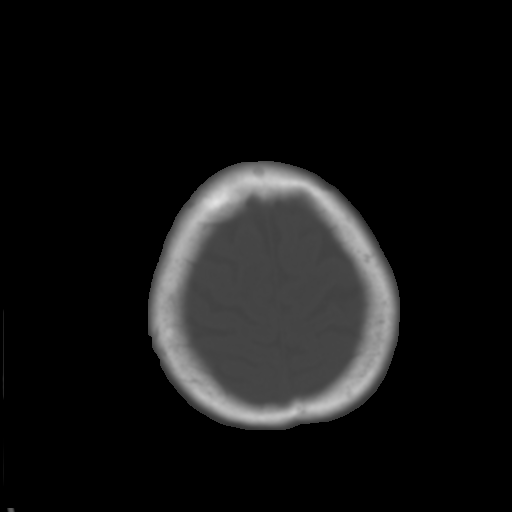
[im 30/33  brain]
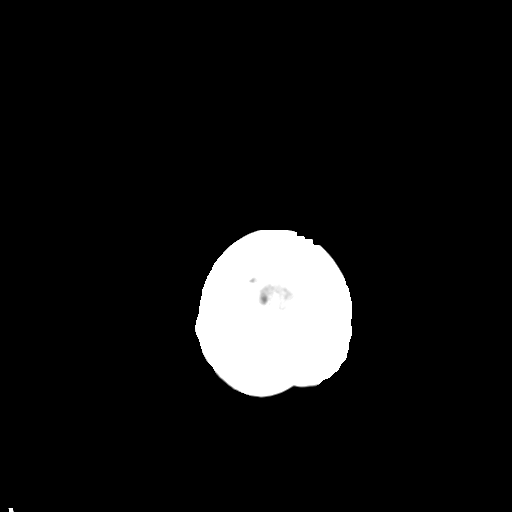

[Series 6: coronal soft tissue · coronal · 0.31mm/px · 3 of 67 slices shown]
[im 24/67  brain]
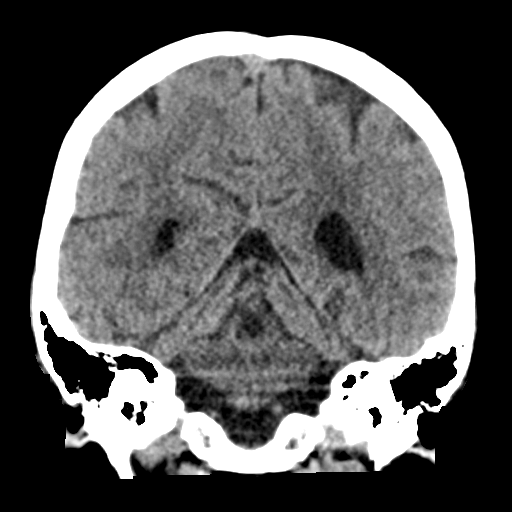
[im 30/67  brain]
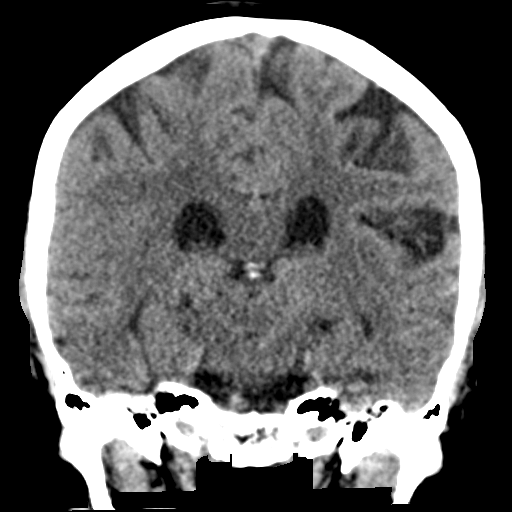
[im 37/67  brain]
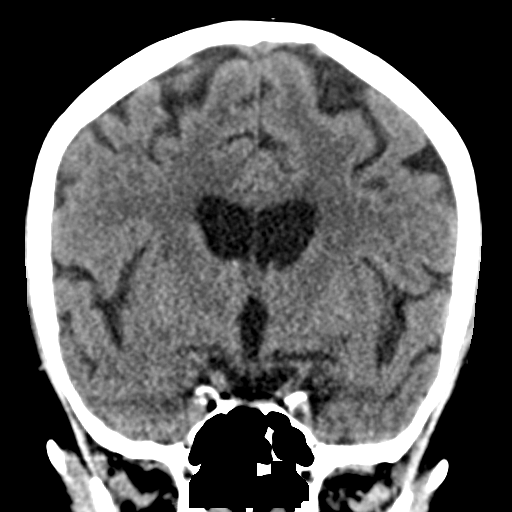

[Series 7: sagittal soft tissue · sagittal · 0.31mm/px · 3 of 53 slices shown]
[im 18/53  brain]
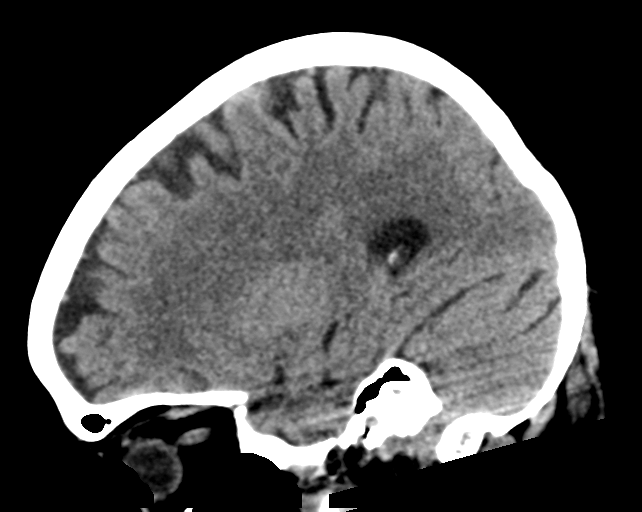
[im 27/53  brain]
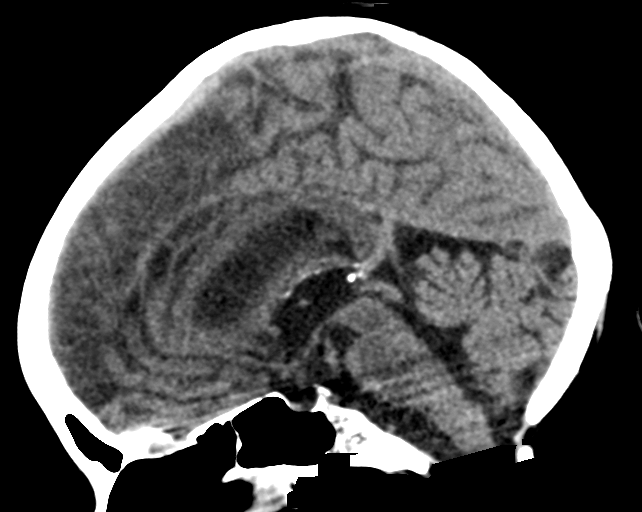
[im 35/53  brain]
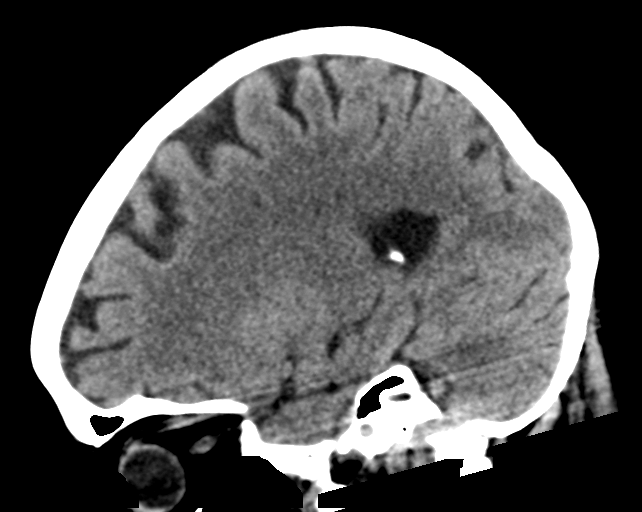

[16 of 47 positions shown; findings below may reference images not displayed]

FINDINGS: Brain: Normal for age atrophy. No intracranial hemorrhage, mass
effect, or midline shift. No hydrocephalus. The basilar cisterns are
patent. Mild periventricular and deep white matter hypodensity
consistent with chronic small vessel ischemia. No evidence of
territorial infarct or acute ischemia. No extra-axial or
intracranial fluid collection.

Vascular: No hyperdense vessel or unexpected calcification.

Skull: No fracture or focal lesion.

Sinuses/Orbits: Assessed on concurrent face CT, reported separately.

Other: None.
IMPRESSION: 1. No acute intracranial abnormality. No skull fracture.
2. Mild chronic small vessel ischemia.

## 2021-03-04 DIAGNOSIS — M75111 Incomplete rotator cuff tear or rupture of right shoulder, not specified as traumatic: Secondary | ICD-10-CM | POA: Diagnosis not present

## 2021-03-04 DIAGNOSIS — M25511 Pain in right shoulder: Secondary | ICD-10-CM | POA: Diagnosis not present

## 2021-03-04 DIAGNOSIS — Z96611 Presence of right artificial shoulder joint: Secondary | ICD-10-CM | POA: Diagnosis not present

## 2021-03-04 DIAGNOSIS — M63811 Disorders of muscle in diseases classified elsewhere, right shoulder: Secondary | ICD-10-CM | POA: Diagnosis not present

## 2021-03-04 DIAGNOSIS — R278 Other lack of coordination: Secondary | ICD-10-CM | POA: Diagnosis not present

## 2021-03-06 ENCOUNTER — Other Ambulatory Visit: Payer: Self-pay | Admitting: Family Medicine

## 2021-03-06 ENCOUNTER — Other Ambulatory Visit: Payer: Self-pay | Admitting: *Deleted

## 2021-03-06 DIAGNOSIS — I10 Essential (primary) hypertension: Secondary | ICD-10-CM

## 2021-03-06 MED ORDER — LOSARTAN POTASSIUM 50 MG PO TABS: 1.0000 | ORAL_TABLET | Freq: Every day | ORAL | 0 refills | Status: DC

## 2021-03-11 DIAGNOSIS — Z96611 Presence of right artificial shoulder joint: Secondary | ICD-10-CM | POA: Diagnosis not present

## 2021-03-11 DIAGNOSIS — M75111 Incomplete rotator cuff tear or rupture of right shoulder, not specified as traumatic: Secondary | ICD-10-CM | POA: Diagnosis not present

## 2021-03-11 DIAGNOSIS — M63811 Disorders of muscle in diseases classified elsewhere, right shoulder: Secondary | ICD-10-CM | POA: Diagnosis not present

## 2021-03-11 DIAGNOSIS — R278 Other lack of coordination: Secondary | ICD-10-CM | POA: Diagnosis not present

## 2021-03-11 DIAGNOSIS — M25511 Pain in right shoulder: Secondary | ICD-10-CM | POA: Diagnosis not present

## 2021-03-18 DIAGNOSIS — M75111 Incomplete rotator cuff tear or rupture of right shoulder, not specified as traumatic: Secondary | ICD-10-CM | POA: Diagnosis not present

## 2021-03-18 DIAGNOSIS — R278 Other lack of coordination: Secondary | ICD-10-CM | POA: Diagnosis not present

## 2021-03-18 DIAGNOSIS — M63811 Disorders of muscle in diseases classified elsewhere, right shoulder: Secondary | ICD-10-CM | POA: Diagnosis not present

## 2021-03-18 DIAGNOSIS — Z96611 Presence of right artificial shoulder joint: Secondary | ICD-10-CM | POA: Diagnosis not present

## 2021-03-18 DIAGNOSIS — M25511 Pain in right shoulder: Secondary | ICD-10-CM | POA: Diagnosis not present

## 2021-03-23 ENCOUNTER — Other Ambulatory Visit: Payer: Self-pay

## 2021-03-23 ENCOUNTER — Encounter: Payer: Self-pay | Admitting: Family

## 2021-03-23 ENCOUNTER — Ambulatory Visit: Payer: Medicare PPO | Admitting: Family

## 2021-03-23 VITALS — BP 152/68 | HR 75 | Temp 98.7°F | Resp 16 | Ht 67.0 in | Wt 181.0 lb

## 2021-03-23 DIAGNOSIS — F418 Other specified anxiety disorders: Secondary | ICD-10-CM | POA: Diagnosis not present

## 2021-03-23 DIAGNOSIS — R5383 Other fatigue: Secondary | ICD-10-CM | POA: Insufficient documentation

## 2021-03-23 DIAGNOSIS — R339 Retention of urine, unspecified: Secondary | ICD-10-CM

## 2021-03-23 DIAGNOSIS — E039 Hypothyroidism, unspecified: Secondary | ICD-10-CM | POA: Diagnosis not present

## 2021-03-23 DIAGNOSIS — R197 Diarrhea, unspecified: Secondary | ICD-10-CM

## 2021-03-23 DIAGNOSIS — E538 Deficiency of other specified B group vitamins: Secondary | ICD-10-CM

## 2021-03-23 LAB — CBC WITH DIFFERENTIAL/PLATELET
Basophils Absolute: 0 10*3/uL (ref 0.0–0.1)
Basophils Relative: 1.3 % (ref 0.0–3.0)
Eosinophils Absolute: 0.1 10*3/uL (ref 0.0–0.7)
Eosinophils Relative: 3.5 % (ref 0.0–5.0)
HCT: 31.4 % — ABNORMAL LOW (ref 36.0–46.0)
Hemoglobin: 10.3 g/dL — ABNORMAL LOW (ref 12.0–15.0)
Lymphocytes Relative: 31.6 % (ref 12.0–46.0)
Lymphs Abs: 1.2 10*3/uL (ref 0.7–4.0)
MCHC: 32.9 g/dL (ref 30.0–36.0)
MCV: 89.9 fl (ref 78.0–100.0)
Monocytes Absolute: 0.3 10*3/uL (ref 0.1–1.0)
Monocytes Relative: 8.5 % (ref 3.0–12.0)
Neutro Abs: 2.1 10*3/uL (ref 1.4–7.7)
Neutrophils Relative %: 55.1 % (ref 43.0–77.0)
Platelets: 191 10*3/uL (ref 150.0–400.0)
RBC: 3.5 Mil/uL — ABNORMAL LOW (ref 3.87–5.11)
RDW: 14.9 % (ref 11.5–15.5)
WBC: 3.8 10*3/uL — ABNORMAL LOW (ref 4.0–10.5)

## 2021-03-23 LAB — COMPREHENSIVE METABOLIC PANEL
ALT: 20 U/L (ref 0–35)
AST: 22 U/L (ref 0–37)
Albumin: 3.8 g/dL (ref 3.5–5.2)
Alkaline Phosphatase: 62 U/L (ref 39–117)
BUN: 12 mg/dL (ref 6–23)
CO2: 26 mEq/L (ref 19–32)
Calcium: 9.5 mg/dL (ref 8.4–10.5)
Chloride: 106 mEq/L (ref 96–112)
Creatinine, Ser: 0.63 mg/dL (ref 0.40–1.20)
GFR: 85.33 mL/min (ref >60.00)
Glucose, Bld: 102 mg/dL — ABNORMAL HIGH (ref 70–99)
Potassium: 4.7 mEq/L (ref 3.5–5.1)
Sodium: 138 mEq/L (ref 135–145)
Total Bilirubin: 0.4 mg/dL (ref 0.2–1.2)
Total Protein: 6.2 g/dL (ref 6.0–8.3)

## 2021-03-23 LAB — TSH: TSH: 1.46 u[IU]/mL (ref 0.35–4.50)

## 2021-03-23 LAB — VITAMIN B12: Vitamin B-12: 1066 pg/mL — ABNORMAL HIGH (ref 211–911)

## 2021-03-23 MED ORDER — CHOLESTYRAMINE 4 G PO PACK: 4.0000 g | PACK | Freq: Three times a day (TID) | ORAL | 3 refills | Status: DC

## 2021-03-23 MED ORDER — BUPROPION HCL 75 MG PO TABS: 75.0000 mg | ORAL_TABLET | Freq: Two times a day (BID) | ORAL | 1 refills | Status: DC

## 2021-03-23 NOTE — Assessment & Plan Note (Signed)
Check b12 level  

## 2021-03-23 NOTE — Progress Notes (Signed)
Subjective:   By signing my name below, I, Stacy Ayers, attest that this documentation has been prepared under the direction and in the presence of Stacy Alar, NP. 03/23/2021    Patient ID: Stacy Ayers, female    DOB: Feb 11, 1941, 80 y.o.   MRN: [id number]  No chief complaint on file.   HPI Patient is in today for a office visit. She is complaining about her knee recently but she is seeing another provider to manage this issue. She also notes having cramps in her inner thighs, diarrhea, swelling in her lower extremities, and fatigue. She reports taking hemp gummies to manage her knee pain. She notes she was drinking wine while taking the gummies. She felt more energy after she stopped taking the hemp gummies and stopped the wine consumption.  She denies having any burning with urination but does have incomplete emptying of her bladder.   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 She states that she has had diarrhea ever since her gastric bypass surgery.  Anxiety/depression- She takes 75 mg effexor daily PO to manage her anxiety/depression. She has had poor motivation and is concerned that her depression symptoms have worsened.    Past Medical History:  Diagnosis Date  . Abnormal liver function tests   . Allergy   . Anxiety   . Arthritis    knees, lower back   . Asthma   . Cataract    left eye growing cataract   . Depression   . Diabetes mellitus    had gastric bypass DM resolved-   . Diverticulosis   . GERD (gastroesophageal reflux disease)   . Headache    Previous migraines  . Heart murmur   . History of colon polyps    adenomatous  . Hyperlipidemia    past hx- resloved after gastric bypass   . Hypertension   . Hypothyroidism   . IBS (irritable bowel syndrome)   . Iron deficiency anemia   . Osteoporosis     Past Surgical History:  Procedure Laterality Date  . ABDOMINAL HYSTERECTOMY    . ABDOMINAL  HYSTERECTOMY    . APPENDECTOMY    . BLADDER REPAIR    . BREAST BIOPSY Right    needle core biopsy, benign  . CATARACT EXTRACTION Left   . CHOLECYSTECTOMY    . COLONOSCOPY    . CORONARY ANGIOPLASTY    . GASTRIC BYPASS    . POLYPECTOMY    . REVERSE SHOULDER ARTHROPLASTY Right 07/15/2020   Procedure: REVERSE SHOULDER ARTHROPLASTY;  Surgeon: Justice Britain, MD;  Location: [facility];  Service: Orthopedics;  Laterality: Right;  160min  . TONSILLECTOMY AND ADENOIDECTOMY      Family History  Problem Relation Age of Onset  . Diabetes Sister   . Heart disease Maternal Grandmother   . Angina Maternal Grandmother   . Stroke Mother   . Suicidality Maternal Grandfather   . Cancer Paternal Grandmother   . Obesity Father   . Colon cancer Neg Hx   . Colon polyps Neg Hx   . Esophageal cancer Neg Hx   . Rectal cancer Neg Hx   . Stomach cancer Neg Hx   . Pancreatic cancer Neg Hx   . Liver disease Neg Hx     Social History   Socioeconomic History  . Marital status: Single    Spouse name: Not on file  . Number of children: 1  . Years of education: Not on file  .  Highest education level: Not on file  Occupational History  . Occupation: retired Product manager: RETIRED  Tobacco Use  . Smoking status: Former Smoker    Packs/day: 0.30    Years: 2.00    Pack years: 0.60    Types: Cigarettes    Quit date: 07/11/1962    Years since quitting: 58.7  . Smokeless tobacco: Never Used  Vaping Use  . Vaping Use: Never used  Substance and Sexual Activity  . Alcohol use: Not Currently    Comment: None in 7 weeks  . Drug use: No  . Sexual activity: Yes    Birth control/protection: Surgical    Comment: Hysterectomy  Other Topics Concern  . Not on file  Social History Narrative   Daily caffeine   Social Determinants of Health   Financial Resource Strain: Low Risk   . Difficulty of Paying Living Expenses: Not hard at all  Food Insecurity: No Food Insecurity  . Worried About Ship broker in the Last Year: Never true  . Ran Out of Food in the Last Year: Never true  Transportation Needs: No Transportation Needs  . Lack of Transportation (Medical): No  . Lack of Transportation (Non-Medical): No  Physical Activity: Not on file  Stress: Not on file  Social Connections: Not on file  Intimate Partner Violence: Not on file    Outpatient Medications Prior to Visit  Medication Sig Dispense Refill  . acetaminophen (TYLENOL) 650 MG CR tablet Take 650 mg by mouth every 8 (eight) hours as needed for pain.    Marland Kitchen albuterol (PROVENTIL HFA) 108 (90 Base) MCG/ACT inhaler Inhale 2 puffs into the lungs every 6 (six) hours as needed. 1 each 3  . cholecalciferol (VITAMIN D) 1000 UNITS tablet Take 1,000 Units by mouth daily.    . cyclobenzaprine (FLEXERIL) 10 MG tablet Take 1 tablet (10 mg total) by mouth 3 (three) times daily as needed for muscle spasms. 30 tablet 1  . denosumab (PROLIA) 60 MG/ML SOSY injection Inject 60 mg into the skin every 6 (six) months. 180 mL 1  . docusate sodium (COLACE) 100 MG capsule Take 100 mg by mouth 2 (two) times daily as needed for mild constipation.    . fluticasone (FLONASE) 50 MCG/ACT nasal spray Place 2 sprays into both nostrils daily. 16 g 5  . levothyroxine (SYNTHROID) 137 MCG tablet Take 1 tablet (137 mcg total) by mouth M,W,F. Take 150 mcg all other days. 90 tablet 3  . levothyroxine (SYNTHROID) 150 MCG tablet Take 1 tablet (150 mcg total) by mouth daily before breakfast. 90 tablet 3  . loperamide (IMODIUM) 2 MG capsule Take 2 mg by mouth as needed for diarrhea or loose stools.     Marland Kitchen loratadine (CLARITIN) 10 MG tablet TAKE 1 TABLET ONCE DAILY. 30 tablet 4  . losartan (COZAAR) 50 MG tablet Take 1 tablet (50 mg total) by mouth daily. 90 tablet 0  . ondansetron (ZOFRAN) 4 MG tablet Take 1 tablet (4 mg total) by mouth every 8 (eight) hours as needed for nausea or vomiting. 10 tablet 0  . pantoprazole (PROTONIX) 40 MG tablet Take 1 tablet (40 mg total)  by mouth daily. 90 tablet 03  . predniSONE (DELTASONE) 10 MG tablet TAKE 3 TABLETS PO QD FOR 3 DAYS THEN TAKE 2 TABLETS PO QD FOR 3 DAYS THEN TAKE 1 TABLET PO QD FOR 3 DAYS THEN TAKE 1/2 TAB PO QD FOR 3 DAYS 20 tablet 0  .  Probiotic Product (PROBIOTIC DAILY) CAPS Take 1 capsule by mouth daily.    . promethazine-dextromethorphan (PROMETHAZINE-DM) 6.25-15 MG/5ML syrup Take 5 mLs by mouth 4 (four) times daily as needed. 118 mL 0  . venlafaxine XR (EFFEXOR XR) 75 MG 24 hr capsule Take 1 capsule (75 mg total) by mouth daily with breakfast. 90 capsule 3  . vitamin B-12 (CYANOCOBALAMIN) 1000 MCG tablet Take 1,000 mcg by mouth daily.     No facility-administered medications prior to visit.    Allergies  Allergen Reactions  . Sulfonamide Derivatives Diarrhea  . Cocaine     Other reaction(s): Hallucination Sinus irrigation  . Montelukast Sodium     UNKNOWN  . Penicillins     Local skin reaction.  Tolerated ancef 07/15/2020   . Pioglitazone     UNKNOWN   . Rosiglitazone Maleate     UNKNOWN  . Secobarbital Sodium     UNKNOWN  . Codeine Rash  . Doxycycline Nausea Only    REACTION: GI UPSET    Review of Systems  Constitutional: Positive for malaise/fatigue.  Cardiovascular: Positive for leg swelling (bilateral lower extremities).  Gastrointestinal: Positive for diarrhea. Negative for constipation.  Genitourinary: Negative for dysuria.       Objective:    Physical Exam Constitutional:      Appearance: She is well-developed.  Neck:     Thyroid: No thyromegaly.  Cardiovascular:     Rate and Rhythm: Normal rate and regular rhythm.     Pulses: Normal pulses.     Heart sounds: Normal heart sounds. No murmur heard.     Comments: 2+ bilateral lower extremity edema. Pulmonary:     Effort: Pulmonary effort is normal. No respiratory distress.     Breath sounds: Normal breath sounds. No wheezing.  Musculoskeletal:     Cervical back: Neck supple.  Skin:    General: Skin is warm and  dry.  Neurological:     Mental Status: She is alert and oriented to person, place, and time.  Psychiatric:        Attention and Perception: Attention normal.        Mood and Affect: Affect is flat.        Speech: Speech normal.        Behavior: Behavior normal.        Thought Content: Thought content normal.        Judgment: Judgment normal.     There were no vitals taken for this visit. Wt Readings from Last 3 Encounters:  11/06/20 175 lb (79.4 kg)  07/16/20 178 lb 12.8 oz (81.1 kg)  07/15/20 173 lb 7 oz (78.7 kg)    Diabetic Foot Exam - Simple   No data filed    Lab Results  Component Value Date   WBC 5.0 07/09/2020   HGB 11.4 (L) 07/09/2020   HCT 34.7 (L) 07/09/2020   PLT 208 07/09/2020   GLUCOSE 117 (H) 11/06/2020   CHOL 179 11/06/2020   TRIG 139.0 11/06/2020   HDL 42.20 11/06/2020   LDLDIRECT 147.0 05/28/2019   LDLCALC 109 (H) 11/06/2020   ALT 11 11/06/2020   AST 14 11/06/2020   NA 139 11/06/2020   K 4.6 11/06/2020   CL 102 11/06/2020   CREATININE 0.58 11/06/2020   BUN 8 11/06/2020   CO2 31 11/06/2020   TSH 0.10 (L) 11/06/2020   INR Positive 04/01/2017   HGBA1C 6.1 11/06/2020    Lab Results  Component Value Date   TSH 0.10 (L) 11/06/2020  Lab Results  Component Value Date   WBC 5.0 07/09/2020   HGB 11.4 (L) 07/09/2020   HCT 34.7 (L) 07/09/2020   MCV 99.1 07/09/2020   PLT 208 07/09/2020   Lab Results  Component Value Date   NA 139 11/06/2020   K 4.6 11/06/2020   CO2 31 11/06/2020   GLUCOSE 117 (H) 11/06/2020   BUN 8 11/06/2020   CREATININE 0.58 11/06/2020   BILITOT 0.6 11/06/2020   ALKPHOS 66 11/06/2020   AST 14 11/06/2020   ALT 11 11/06/2020   PROT 6.3 11/06/2020   ALBUMIN 4.0 11/06/2020   CALCIUM 9.5 11/06/2020   ANIONGAP 7 07/09/2020   GFR 87.27 11/06/2020   Lab Results  Component Value Date   CHOL 179 11/06/2020   Lab Results  Component Value Date   HDL 42.20 11/06/2020   Lab Results  Component Value Date   LDLCALC 109  (H) 11/06/2020   Lab Results  Component Value Date   TRIG 139.0 11/06/2020   Lab Results  Component Value Date   CHOLHDL 4 11/06/2020   Lab Results  Component Value Date   HGBA1C 6.1 11/06/2020       Assessment & Plan:   Problem List Items Addressed This Visit   None      No orders of the defined types were placed in this encounter.   I, Stacy Reeves [place], personally preformed the services described in this documentation.  All medical record entries made by the scribe were at my direction and in my presence.  I have reviewed the chart and discharge instructions (if applicable) and agree that the record reflects my personal performance and is accurate and complete. 03/23/2021   I,Stacy Ayers,acting as a Education administrator for Nance Pear, NP.,have documented all relevant documentation on the behalf of Nance Pear, NP,as directed by  Nance Pear, NP while in the presence of Nance Pear, NP.   Stacy Garrett, NP, have reviewed all documentation for this visit. The documentation on 03/23/21 for the exam, diagnosis, procedures, and orders are all accurate and complete.

## 2021-03-23 NOTE — Assessment & Plan Note (Signed)
Obtain TSH.  Could be cause for fatigue.  Check cbc, cmet. She also has some LE edema. Could consider diuretic next visit. I don't want to start today as she is going on a cruise and we would not be able to check her electrolytes/bp in a timely manner. Lungs are clear.

## 2021-03-23 NOTE — Patient Instructions (Addendum)
Please complete lab work prior to leaving. Add questran powder 3 times daily as needed for diarrhea. Add wellbutrin 75mg  twice daily for depression.

## 2021-03-23 NOTE — Assessment & Plan Note (Signed)
Depression is uncontrolled. She reports that she had hypersomnolence on increased dose of effexor in the past.  Will continue effexor 75mg  and add trial of wellbutrin 75mg  bid.

## 2021-03-23 NOTE — Assessment & Plan Note (Signed)
Uncontrolled despite use of imodium. Will give trial of questran.

## 2021-03-24 DIAGNOSIS — M75111 Incomplete rotator cuff tear or rupture of right shoulder, not specified as traumatic: Secondary | ICD-10-CM | POA: Diagnosis not present

## 2021-03-24 DIAGNOSIS — R278 Other lack of coordination: Secondary | ICD-10-CM | POA: Diagnosis not present

## 2021-03-24 DIAGNOSIS — Z96611 Presence of right artificial shoulder joint: Secondary | ICD-10-CM | POA: Diagnosis not present

## 2021-03-24 DIAGNOSIS — M63811 Disorders of muscle in diseases classified elsewhere, right shoulder: Secondary | ICD-10-CM | POA: Diagnosis not present

## 2021-03-24 DIAGNOSIS — M25511 Pain in right shoulder: Secondary | ICD-10-CM | POA: Diagnosis not present

## 2021-03-24 LAB — URINE CULTURE
MICRO NUMBER:: 11809138
Result:: NO GROWTH
SPECIMEN QUALITY:: ADEQUATE

## 2021-03-25 ENCOUNTER — Telehealth: Payer: Self-pay | Admitting: Family

## 2021-03-25 ENCOUNTER — Other Ambulatory Visit: Payer: Self-pay

## 2021-03-25 DIAGNOSIS — D649 Anemia, unspecified: Secondary | ICD-10-CM

## 2021-03-25 NOTE — Telephone Encounter (Signed)
Urine culture is negative for infection. B12, kidneys and thyroid all look good. Anemia is slightly worse than last time it was checked. Let's have her complete and IFOB, dx anemia.

## 2021-03-25 NOTE — Telephone Encounter (Signed)
Patient advised of results. She verbalized understanding. She will come in to pick up IFOB kit after her 2 weeks vacation. 

## 2021-03-27 DIAGNOSIS — M1712 Unilateral primary osteoarthritis, left knee: Secondary | ICD-10-CM | POA: Diagnosis not present

## 2021-03-29 DIAGNOSIS — Z20822 Contact with and (suspected) exposure to covid-19: Secondary | ICD-10-CM | POA: Diagnosis not present

## 2021-04-16 ENCOUNTER — Other Ambulatory Visit: Payer: Self-pay | Admitting: Family Medicine

## 2021-04-16 DIAGNOSIS — F418 Other specified anxiety disorders: Secondary | ICD-10-CM

## 2021-04-20 ENCOUNTER — Ambulatory Visit: Payer: Medicare PPO | Admitting: Family Medicine

## 2021-04-21 DIAGNOSIS — M1712 Unilateral primary osteoarthritis, left knee: Secondary | ICD-10-CM | POA: Diagnosis not present

## 2021-04-28 ENCOUNTER — Other Ambulatory Visit: Payer: Self-pay

## 2021-04-28 ENCOUNTER — Encounter: Payer: Self-pay | Admitting: Family Medicine

## 2021-04-28 ENCOUNTER — Ambulatory Visit: Payer: Medicare PPO | Admitting: Family Medicine

## 2021-04-28 ENCOUNTER — Other Ambulatory Visit (HOSPITAL_BASED_OUTPATIENT_CLINIC_OR_DEPARTMENT_OTHER): Payer: Self-pay | Admitting: Family Medicine

## 2021-04-28 VITALS — BP 126/80 | HR 81 | Temp 99.5°F | Resp 18 | Ht 67.0 in | Wt 174.8 lb

## 2021-04-28 DIAGNOSIS — E2839 Other primary ovarian failure: Secondary | ICD-10-CM

## 2021-04-28 DIAGNOSIS — E1151 Type 2 diabetes mellitus with diabetic peripheral angiopathy without gangrene: Secondary | ICD-10-CM | POA: Insufficient documentation

## 2021-04-28 DIAGNOSIS — E1139 Type 2 diabetes mellitus with other diabetic ophthalmic complication: Secondary | ICD-10-CM | POA: Diagnosis not present

## 2021-04-28 DIAGNOSIS — M810 Age-related osteoporosis without current pathological fracture: Secondary | ICD-10-CM | POA: Diagnosis not present

## 2021-04-28 DIAGNOSIS — E119 Type 2 diabetes mellitus without complications: Secondary | ICD-10-CM | POA: Insufficient documentation

## 2021-04-28 DIAGNOSIS — F418 Other specified anxiety disorders: Secondary | ICD-10-CM | POA: Diagnosis not present

## 2021-04-28 DIAGNOSIS — D649 Anemia, unspecified: Secondary | ICD-10-CM | POA: Diagnosis not present

## 2021-04-28 DIAGNOSIS — D508 Other iron deficiency anemias: Secondary | ICD-10-CM | POA: Diagnosis not present

## 2021-04-28 DIAGNOSIS — Z1231 Encounter for screening mammogram for malignant neoplasm of breast: Secondary | ICD-10-CM

## 2021-04-28 MED ORDER — BUPROPION HCL ER (XL) 150 MG PO TB24: 150.0000 mg | ORAL_TABLET | Freq: Every day | ORAL | 2 refills | Status: DC

## 2021-04-28 NOTE — Patient Instructions (Signed)

## 2021-04-28 NOTE — Assessment & Plan Note (Signed)
Check labs today Take mvi with iron Pt does not tol iron supp Consider hematology

## 2021-04-28 NOTE — Progress Notes (Signed)
Subjective:   By signing my name below, I, Stacy Ayers, attest that this documentation has been prepared under the direction and in the presence of Dr. Roma Schanz, DO. 04/28/2021      Patient ID: Stacy Ayers, female    DOB: [birth year], 80 y.o.   MRN: [id number]  Chief Complaint  Patient presents with  . Fatigue  . Depression  . Follow-up    HPI Patient is in today for a office visit.   Her shoulder has improved since her procedure. Her depression symptoms have improved since her last visit. She continues to take 75 mg Wellbutrin PO but only takes it 1x daily. She continues to take 75 mg Effexor daily PO. She does not take anything to manage her anemia at this time.  She is not seeing a gastroenterologists for her diarrhea issues. She denies having any blood in the stool at this time. She has 4 moderna Covid-19 vaccines. She is due for a mammogram and bone density exam. She is also due for a Prolia injection.     Past Medical History:  Diagnosis Date  . Abnormal liver function tests   . Allergy   . Anxiety   . Arthritis    knees, lower back   . Asthma   . Cataract    left eye growing cataract   . Depression   . Diabetes mellitus    had gastric bypass DM resolved-   . Diverticulosis   . GERD (gastroesophageal reflux disease)   . Headache    Previous migraines  . Heart murmur   . History of colon polyps    adenomatous  . Hyperlipidemia    past hx- resloved after gastric bypass   . Hypertension   . Hypothyroidism   . IBS (irritable bowel syndrome)   . Iron deficiency anemia   . Osteoporosis     Past Surgical History:  Procedure Laterality Date  . ABDOMINAL HYSTERECTOMY    . ABDOMINAL HYSTERECTOMY    . APPENDECTOMY    . BLADDER REPAIR    . BREAST BIOPSY Right    needle core biopsy, benign  . CATARACT EXTRACTION Left   . CHOLECYSTECTOMY    . COLONOSCOPY    . CORONARY ANGIOPLASTY    . GASTRIC BYPASS    . POLYPECTOMY    . REVERSE SHOULDER  ARTHROPLASTY Right 07/15/2020   Procedure: REVERSE SHOULDER ARTHROPLASTY;  Surgeon: Justice Britain, MD;  Location: [facility];  Service: Orthopedics;  Laterality: Right;  155min  . TONSILLECTOMY AND ADENOIDECTOMY      Family History  Problem Relation Age of Onset  . Diabetes Sister   . Heart disease Maternal Grandmother   . Angina Maternal Grandmother   . Stroke Mother   . Suicidality Maternal Grandfather   . Cancer Paternal Grandmother   . Obesity Father   . Colon cancer Neg Hx   . Colon polyps Neg Hx   . Esophageal cancer Neg Hx   . Rectal cancer Neg Hx   . Stomach cancer Neg Hx   . Pancreatic cancer Neg Hx   . Liver disease Neg Hx     Social History   Socioeconomic History  . Marital status: Single    Spouse name: Not on file  . Number of children: 1  . Years of education: Not on file  . Highest education level: Not on file  Occupational History  . Occupation: retired Product manager: RETIRED  Tobacco Use  . Smoking status:  Former Smoker    Packs/day: 0.30    Years: 2.00    Pack years: 0.60    Types: Cigarettes    Quit date: 07/11/1962    Years since quitting: 58.8  . Smokeless tobacco: Never Used  Vaping Use  . Vaping Use: Never used  Substance and Sexual Activity  . Alcohol use: Not Currently    Comment: None in 7 weeks  . Drug use: No  . Sexual activity: Yes    Birth control/protection: Surgical    Comment: Hysterectomy  Other Topics Concern  . Not on file  Social History Narrative   Daily caffeine   Social Determinants of Health   Financial Resource Strain: Low Risk   . Difficulty of Paying Living Expenses: Not hard at all  Food Insecurity: No Food Insecurity  . Worried About Charity fundraiser in the Last Year: Never true  . Ran Out of Food in the Last Year: Never true  Transportation Needs: No Transportation Needs  . Lack of Transportation (Medical): No  . Lack of Transportation (Non-Medical): No  Physical Activity: Not on file  Stress: Not  on file  Social Connections: Not on file  Intimate Partner Violence: Not on file    Outpatient Medications Prior to Visit  Medication Sig Dispense Refill  . acetaminophen (TYLENOL) 650 MG CR tablet Take 650 mg by mouth every 8 (eight) hours as needed for pain.    Marland Kitchen albuterol (PROVENTIL HFA) 108 (90 Base) MCG/ACT inhaler Inhale 2 puffs into the lungs every 6 (six) hours as needed. 1 each 3  . cholecalciferol (VITAMIN D) 1000 UNITS tablet Take 1,000 Units by mouth daily.    . cyclobenzaprine (FLEXERIL) 10 MG tablet Take 1 tablet (10 mg total) by mouth 3 (three) times daily as needed for muscle spasms. 30 tablet 1  . denosumab (PROLIA) 60 MG/ML SOSY injection Inject 60 mg into the skin every 6 (six) months. 180 mL 1  . docusate sodium (COLACE) 100 MG capsule Take 100 mg by mouth 2 (two) times daily as needed for mild constipation.    . fluticasone (FLONASE) 50 MCG/ACT nasal spray Place 2 sprays into both nostrils daily. 16 g 5  . levothyroxine (SYNTHROID) 137 MCG tablet Take 1 tablet (137 mcg total) by mouth M,W,F. Take 150 mcg all other days. 90 tablet 3  . levothyroxine (SYNTHROID) 150 MCG tablet Take 1 tablet (150 mcg total) by mouth daily before breakfast. 90 tablet 3  . loperamide (IMODIUM) 2 MG capsule Take 2 mg by mouth as needed for diarrhea or loose stools.     Marland Kitchen loratadine (CLARITIN) 10 MG tablet TAKE 1 TABLET ONCE DAILY. 30 tablet 4  . losartan (COZAAR) 50 MG tablet Take 1 tablet (50 mg total) by mouth daily. 90 tablet 0  . pantoprazole (PROTONIX) 40 MG tablet Take 1 tablet (40 mg total) by mouth daily. 90 tablet 03  . Probiotic Product (PROBIOTIC DAILY) CAPS Take 1 capsule by mouth daily.    Marland Kitchen venlafaxine XR (EFFEXOR-XR) 75 MG 24 hr capsule TAKE 1 CAPSULE ONCE DAILY WITH BREAKFAST. 90 capsule 1  . vitamin B-12 (CYANOCOBALAMIN) 1000 MCG tablet Take 1,000 mcg by mouth daily.    Marland Kitchen buPROPion (WELLBUTRIN) 75 MG tablet Take 1 tablet (75 mg total) by mouth 2 (two) times daily. 60 tablet 1   . cholestyramine (QUESTRAN) 4 g packet Take 1 packet (4 g total) by mouth 3 (three) times daily with meals. (Patient not taking: Reported on 04/28/2021) 90  each 3  . ondansetron (ZOFRAN) 4 MG tablet Take 1 tablet (4 mg total) by mouth every 8 (eight) hours as needed for nausea or vomiting. (Patient not taking: Reported on 04/28/2021) 10 tablet 0   No facility-administered medications prior to visit.    Allergies  Allergen Reactions  . Sulfonamide Derivatives Diarrhea  . Cocaine     Other reaction(s): Hallucination Sinus irrigation  . Montelukast Sodium     UNKNOWN  . Penicillins     Local skin reaction.  Tolerated ancef 07/15/2020   . Pioglitazone     UNKNOWN   . Rosiglitazone Maleate     UNKNOWN  . Secobarbital Sodium     UNKNOWN  . Codeine Rash  . Doxycycline Nausea Only    REACTION: GI UPSET    Review of Systems  Constitutional: Negative for chills, fever and malaise/fatigue.  HENT: Negative for congestion and hearing loss.   Eyes: Negative for discharge.  Respiratory: Negative for cough, sputum production and shortness of breath.   Cardiovascular: Negative for chest pain, palpitations and leg swelling.  Gastrointestinal: Negative for blood in stool, constipation, diarrhea, heartburn, nausea and vomiting. Abdominal pain: Lower right abdominal   Genitourinary: Negative for dysuria, frequency, hematuria and urgency.  Musculoskeletal: Negative for back pain, falls and myalgias.  Skin: Negative for rash.  Neurological: Negative for dizziness, sensory change, loss of consciousness, weakness and headaches.  Endo/Heme/Allergies: Negative for environmental allergies. Does not bruise/bleed easily.  Psychiatric/Behavioral: Positive for depression. Negative for suicidal ideas. The patient is not nervous/anxious and does not have insomnia.        Objective:    Physical Exam Vitals and nursing note reviewed.  Constitutional:      General: She is not in acute distress.     Appearance: Normal appearance. She is not ill-appearing.  HENT:     Head: Normocephalic and atraumatic.     Right Ear: External ear normal.     Left Ear: External ear normal.  Eyes:     Extraocular Movements: Extraocular movements intact.     Pupils: Pupils are equal, round, and reactive to light.  Cardiovascular:     Rate and Rhythm: Normal rate and regular rhythm.     Pulses: Normal pulses.     Heart sounds: Normal heart sounds. No murmur heard. No gallop.   Pulmonary:     Effort: Pulmonary effort is normal. No respiratory distress.     Breath sounds: Normal breath sounds. No wheezing, rhonchi or rales.  Skin:    General: Skin is warm and dry.  Neurological:     Mental Status: She is alert and oriented to person, place, and time.  Psychiatric:        Behavior: Behavior normal.     BP 126/80 (BP Location: Left Arm, Patient Position: Sitting, Cuff Size: Normal)   Pulse 81   Temp 99.5 F (37.5 C) (Oral)   Resp 18   Ht 5\' 7"  (1.702 m)   Wt 174 lb 12.8 oz (79.3 kg)   SpO2 97%   BMI 27.38 kg/m  Wt Readings from Last 3 Encounters:  04/28/21 174 lb 12.8 oz (79.3 kg)  03/23/21 181 lb (82.1 kg)  11/06/20 175 lb (79.4 kg)    Diabetic Foot Exam - Simple   No data filed    Lab Results  Component Value Date   WBC 3.8 (L) 03/23/2021   HGB 10.3 (L) 03/23/2021   HCT 31.4 (L) 03/23/2021   PLT 191.0 03/23/2021   GLUCOSE  102 (H) 03/23/2021   CHOL 179 11/06/2020   TRIG 139.0 11/06/2020   HDL 42.20 11/06/2020   LDLDIRECT 147.0 05/28/2019   LDLCALC 109 (H) 11/06/2020   ALT 20 03/23/2021   AST 22 03/23/2021   NA 138 03/23/2021   K 4.7 03/23/2021   CL 106 03/23/2021   CREATININE 0.63 03/23/2021   BUN 12 03/23/2021   CO2 26 03/23/2021   TSH 1.46 03/23/2021   INR Positive 04/01/2017   HGBA1C 6.1 11/06/2020    Lab Results  Component Value Date   TSH 1.46 03/23/2021   Lab Results  Component Value Date   WBC 3.8 (L) 03/23/2021   HGB 10.3 (L) 03/23/2021   HCT 31.4 (L)  03/23/2021   MCV 89.9 03/23/2021   PLT 191.0 03/23/2021   Lab Results  Component Value Date   NA 138 03/23/2021   K 4.7 03/23/2021   CO2 26 03/23/2021   GLUCOSE 102 (H) 03/23/2021   BUN 12 03/23/2021   CREATININE 0.63 03/23/2021   BILITOT 0.4 03/23/2021   ALKPHOS 62 03/23/2021   AST 22 03/23/2021   ALT 20 03/23/2021   PROT 6.2 03/23/2021   ALBUMIN 3.8 03/23/2021   CALCIUM 9.5 03/23/2021   ANIONGAP 7 07/09/2020   GFR 85.33 03/23/2021   Lab Results  Component Value Date   CHOL 179 11/06/2020   Lab Results  Component Value Date   HDL 42.20 11/06/2020   Lab Results  Component Value Date   LDLCALC 109 (H) 11/06/2020   Lab Results  Component Value Date   TRIG 139.0 11/06/2020   Lab Results  Component Value Date   CHOLHDL 4 11/06/2020   Lab Results  Component Value Date   HGBA1C 6.1 11/06/2020       Assessment & Plan:   Problem List Items Addressed This Visit      Unprioritized   Anemia   Relevant Orders   CBC with Differential/Platelet   IBC + Ferritin   Fecal occult blood, imunochemical   ANEMIA-IRON DEFICIENCY    Check labs today Take mvi with iron Pt does not tol iron supp Consider hematology       Depression with anxiety - Primary    con't effexor Change to wellbutrin xl from wellbutrin ir  F/u after labs checked      Relevant Medications   buPROPion (WELLBUTRIN XL) 150 MG 24 hr tablet   Diabetes mellitus (HCC)   DM (diabetes mellitus) type II, controlled, with peripheral vascular disorder (HCC)   Morbid obesity ([place])    Other Visit Diagnoses    Estrogen deficiency       Relevant Orders   DG Bone Density   Age-related osteoporosis without current pathological fracture       Relevant Orders   Comprehensive metabolic panel       Meds ordered this encounter  Medications  . buPROPion (WELLBUTRIN XL) 150 MG 24 hr tablet    Sig: Take 1 tablet (150 mg total) by mouth daily.    Dispense:  60 tablet    Refill:  2    I, Dr.  Roma Schanz, DO, personally preformed the services described in this documentation.  All medical record entries made by the scribe were at my direction and in my presence.  I have reviewed the chart and discharge instructions (if applicable) and agree that the record reflects my personal performance and is accurate and complete. 04/28/2021   I,Stacy Ayers,acting as a Education administrator for [employer], DO.,have  documented all relevant documentation on the behalf of [name], DO,as directed by  [name], DO while in the presence of [name], DO.   [name], DO

## 2021-04-28 NOTE — Assessment & Plan Note (Signed)
con't effexor Change to wellbutrin xl from wellbutrin ir  F/u after labs checked

## 2021-04-29 ENCOUNTER — Telehealth: Payer: Self-pay

## 2021-04-29 LAB — COMPREHENSIVE METABOLIC PANEL
ALT: 20 U/L (ref 0–35)
AST: 20 U/L (ref 0–37)
Albumin: 4 g/dL (ref 3.5–5.2)
Alkaline Phosphatase: 61 U/L (ref 39–117)
BUN: 11 mg/dL (ref 6–23)
CO2: 27 mEq/L (ref 19–32)
Calcium: 9.5 mg/dL (ref 8.4–10.5)
Chloride: 103 mEq/L (ref 96–112)
Creatinine, Ser: 0.66 mg/dL (ref 0.40–1.20)
GFR: 84.31 mL/min (ref >60.00)
Glucose, Bld: 95 mg/dL (ref 70–99)
Potassium: 4.2 mEq/L (ref 3.5–5.1)
Sodium: 137 mEq/L (ref 135–145)
Total Bilirubin: 0.4 mg/dL (ref 0.2–1.2)
Total Protein: 6.1 g/dL (ref 6.0–8.3)

## 2021-04-29 LAB — CBC WITH DIFFERENTIAL/PLATELET
Basophils Absolute: 0 10*3/uL (ref 0.0–0.1)
Basophils Relative: 0.9 % (ref 0.0–3.0)
Eosinophils Absolute: 0.1 10*3/uL (ref 0.0–0.7)
Eosinophils Relative: 1.6 % (ref 0.0–5.0)
HCT: 33.4 % — ABNORMAL LOW (ref 36.0–46.0)
Hemoglobin: 11.2 g/dL — ABNORMAL LOW (ref 12.0–15.0)
Lymphocytes Relative: 25.5 % (ref 12.0–46.0)
Lymphs Abs: 1.3 10*3/uL (ref 0.7–4.0)
MCHC: 33.4 g/dL (ref 30.0–36.0)
MCV: 91.7 fl (ref 78.0–100.0)
Monocytes Absolute: 0.3 10*3/uL (ref 0.1–1.0)
Monocytes Relative: 6 % (ref 3.0–12.0)
Neutro Abs: 3.3 10*3/uL (ref 1.4–7.7)
Neutrophils Relative %: 66 % (ref 43.0–77.0)
Platelets: 186 10*3/uL (ref 150.0–400.0)
RBC: 3.64 Mil/uL — ABNORMAL LOW (ref 3.87–5.11)
RDW: 16 % — ABNORMAL HIGH (ref 11.5–15.5)
WBC: 5 10*3/uL (ref 4.0–10.5)

## 2021-04-29 LAB — IBC + FERRITIN
Ferritin: 11.2 ng/mL (ref 10.0–291.0)
Iron: 41 ug/dL — ABNORMAL LOW (ref 42–145)
Saturation Ratios: 8.6 % — ABNORMAL LOW (ref 20.0–50.0)
Transferrin: 340 mg/dL (ref 212.0–360.0)

## 2021-04-29 NOTE — Telephone Encounter (Signed)
-----   Message from [name], DO sent at 04/28/2021  1:57 PM EDT ----- Pt states she is due for prolia

## 2021-04-29 NOTE — Telephone Encounter (Signed)
Clinical info submitted to insurance company. Waiting on summary of benefits to determine coverage. Will call patient to discuss once received.  

## 2021-05-18 ENCOUNTER — Ambulatory Visit (HOSPITAL_BASED_OUTPATIENT_CLINIC_OR_DEPARTMENT_OTHER)
Admission: RE | Admit: 2021-05-18 | Discharge: 2021-05-18 | Disposition: A | Discharge status: Home / Self Care | Payer: Medicare PPO | Source: Ambulatory Visit | Attending: Family Medicine | Admitting: Family Medicine

## 2021-05-18 ENCOUNTER — Encounter (HOSPITAL_BASED_OUTPATIENT_CLINIC_OR_DEPARTMENT_OTHER): Payer: Self-pay

## 2021-05-18 ENCOUNTER — Other Ambulatory Visit: Payer: Self-pay

## 2021-05-18 DIAGNOSIS — Z1231 Encounter for screening mammogram for malignant neoplasm of breast: Secondary | ICD-10-CM | POA: Diagnosis not present

## 2021-05-18 DIAGNOSIS — M810 Age-related osteoporosis without current pathological fracture: Secondary | ICD-10-CM | POA: Diagnosis not present

## 2021-05-18 DIAGNOSIS — E2839 Other primary ovarian failure: Secondary | ICD-10-CM | POA: Insufficient documentation

## 2021-05-18 DIAGNOSIS — Z780 Asymptomatic menopausal state: Secondary | ICD-10-CM | POA: Diagnosis not present

## 2021-05-20 ENCOUNTER — Telehealth: Payer: Self-pay | Admitting: Family

## 2021-05-20 NOTE — Telephone Encounter (Signed)
Please contact pt and ask her to return the IFOB kit at her earliest convenience. It looks like we haven't received a result.

## 2021-05-20 NOTE — Telephone Encounter (Signed)
Called patient and advised her to complete test and bring to the office as soon as she can.

## 2021-05-21 DIAGNOSIS — M810 Age-related osteoporosis without current pathological fracture: Secondary | ICD-10-CM

## 2021-05-29 ENCOUNTER — Other Ambulatory Visit (INDEPENDENT_AMBULATORY_CARE_PROVIDER_SITE_OTHER): Payer: Medicare PPO

## 2021-05-29 DIAGNOSIS — D649 Anemia, unspecified: Secondary | ICD-10-CM

## 2021-05-29 LAB — FECAL OCCULT BLOOD, IMMUNOCHEMICAL: Fecal Occult Bld: NEGATIVE

## 2021-06-02 ENCOUNTER — Other Ambulatory Visit: Payer: Self-pay | Admitting: Family Medicine

## 2021-06-02 DIAGNOSIS — I10 Essential (primary) hypertension: Secondary | ICD-10-CM

## 2021-06-16 ENCOUNTER — Telehealth: Payer: Self-pay | Admitting: Family Medicine

## 2021-06-16 NOTE — Telephone Encounter (Signed)
Copied from [place] [id number]. Topic: Medicare AWV >> Jun 16, 2021 11:36 AM Harris-Coley, Hannah Beat wrote: Reason for EPP:IRJJ message for patient to schedule Annual Wellness Visit.  Please schedule with Health Nurse Advisor Augustine Radar. at [facility].

## 2021-06-25 ENCOUNTER — Ambulatory Visit: Payer: Medicare PPO | Admitting: Internal Medicine

## 2021-06-25 ENCOUNTER — Encounter: Payer: Self-pay | Admitting: Internal Medicine

## 2021-06-25 ENCOUNTER — Other Ambulatory Visit: Payer: Self-pay

## 2021-06-25 VITALS — BP 123/71 | HR 74 | Resp 16 | Ht 67.25 in | Wt 176.0 lb

## 2021-06-25 DIAGNOSIS — M810 Age-related osteoporosis without current pathological fracture: Secondary | ICD-10-CM | POA: Diagnosis not present

## 2021-06-25 DIAGNOSIS — E890 Postprocedural hypothyroidism: Secondary | ICD-10-CM | POA: Diagnosis not present

## 2021-06-25 DIAGNOSIS — Z9884 Bariatric surgery status: Secondary | ICD-10-CM | POA: Diagnosis not present

## 2021-06-25 DIAGNOSIS — E559 Vitamin D deficiency, unspecified: Secondary | ICD-10-CM

## 2021-06-25 NOTE — Progress Notes (Signed)
Office Visit Note  Patient: Stacy Ayers             Date of Birth: [birth year]           MRN: [id number]             PCP: [name], DO Referring: [name], * Visit Date: 06/25/2021   Subjective:  New Patient (Initial Visit) (Bone density, total body joint pain, left knee pain - Dr. Wynelle Link)   History of Present Illness: Stacy Ayers is a 80 y.o. 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  She has been on treatment with Prolia since around 4 years ago due to age-related osteoporosis identified on bone densitometry without any pathologic fractures.  She has been off the medication for about 1 year she is not entirely sure why some appointments were rescheduled related to COVID issues.  She believes was tolerating the medication well she had some generalized stiffness and body aches but these were chronic and have also continued in the past year with no medication.  She has a history of right shoulder reverse arthroplasty for osteoarthritis she is currently seeing Dr. Wynelle Link for left knee osteoarthritis considering arthroplasty.  She has had a fracture of her right fifth finger no other joint fractures.  She had a fall last year she reports as tripping when trying to walk to her door while distracted this was evaluated at the ED and no significant injury was sustained. She was not a candidate for oral antiresorptive therapy due to history of bariatric surgery.  She takes vitamin D supplementation regularly she does not take calcium regularly due to GI side effects so she does not absorb this well.  She reports a total height loss of about 0.75".   Imaging reviewed DXA 05/18/21 Total Mean -1.9   0.767  DXA 06/22/19 AP Spine   0.3 1.237   Exclusion L2 2/2 degenerative changes Right Neck  -2.4 0.711 Right Total -1.3 0.846 Left Neck -2.3 0.711 Left Total -1.7 0.796 Total Mean -1.5 0.821  DXA  12/31/16 AP Spine -1.5 0.671 Exclusion L3/L4 2/2 degnerative changes Right Neck -2.5 0.685 Right Total -1.7 0.796 Left Neck -2.6 0.671 Left Total -1.9 0.764  Activities of Daily Living:  Patient reports morning stiffness for 10 minutes.   Patient Denies nocturnal pain.  Difficulty dressing/grooming: Denies Difficulty climbing stairs: Reports Difficulty getting out of chair: Reports Difficulty using hands for taps, buttons, cutlery, and/or writing: Reports  Review of Systems  Constitutional:  Positive for fatigue.  HENT:  Positive for mouth dryness.   Eyes:  Positive for dryness.  Respiratory:  Positive for shortness of breath.   Cardiovascular:  Positive for swelling in legs/feet.  Gastrointestinal:  Positive for diarrhea.  Endocrine: Positive for heat intolerance.  Genitourinary:  Negative for difficulty urinating.  Musculoskeletal:  Positive for joint pain, gait problem, joint pain, muscle weakness, morning stiffness and muscle tenderness.  Skin:  Negative for rash.  Allergic/Immunologic: Negative for susceptible to infections.  Neurological:  Positive for weakness.  Hematological:  Positive for bruising/bleeding tendency.  Psychiatric/Behavioral:  Positive for sleep disturbance.    PMFS History:  Patient Active Problem List   Diagnosis Date Noted   Age-related osteoporosis without current pathological fracture 06/25/2021   Diabetes mellitus ([place]) 04/28/2021   DM (diabetes mellitus) type II, controlled, with peripheral vascular disorder ([place]) 04/28/2021   Morbid obesity ([place]) 04/28/2021  Anemia 04/28/2021   Fatigue 03/23/2021   Right ear pain 11/06/2020   Osteoarthritis of right knee 10/01/2020   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   Viral upper respiratory tract infection 11/14/2018   Osteoarthritis of left knee 05/12/2018   Dysuria 05/03/2018   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   Seasonal allergies 08/11/2015   Alcohol dependence  ([place]) 08/11/2015   CTS (carpal tunnel syndrome) 08/11/2015   Allergic rhinitis 12/19/2013   B12 deficiency 05/22/2013   Left foot pain 09/06/2012   Depression with anxiety 04/05/2012   PERSONAL HISTORY OF COLONIC POLYPS 01/18/2011   SPONTANEOUS ECCHYMOSES 10/13/2010   DIARRHEA, CHRONIC 10/13/2010   PHARYNGITIS, CHRONIC 02/16/2010   Hypothyroidism 01/14/2010   History of bariatric surgery 02/25/2009   Vitamin D deficiency 09/10/2008   VARICOSE VEINS, LOWER EXTREMITIES 09/10/2008   PULMONARY NODULE 08/14/2008   FATIGUE 08/14/2008   MUSCLE PAIN 06/14/2007   BLADDER PAIN 06/14/2007   COLONIC POLYPS 03/14/2007   History of diet-controlled diabetes 03/14/2007   Hyperlipidemia LDL goal <100 03/14/2007   ANEMIA-IRON DEFICIENCY 03/14/2007   Essential hypertension 03/14/2007   VOCAL CORD PARALYSIS 03/14/2007   EDEMA, LARYNX 03/14/2007   ASTHMA 03/14/2007   Esophageal reflux 03/14/2007   LIVER FUNCTION TESTS, ABNORMAL, HX OF 03/14/2007   TONSILLECTOMY AND ADENOIDECTOMY, HX OF 03/14/2007    Past Medical History:  Diagnosis Date   Abnormal liver function tests    Allergy    Anxiety    Arthritis    knees, lower back    Asthma    Cataract    left eye growing cataract    Depression    Diabetes mellitus    had gastric bypass DM resolved-    Diverticulosis    GERD (gastroesophageal reflux disease)    Headache    Previous migraines   Heart murmur    History of colon polyps    adenomatous   Hyperlipidemia    past hx- resloved after gastric bypass    Hypertension    Hypothyroidism    IBS (irritable bowel syndrome)    Iron deficiency anemia    Osteoporosis     Family History  Problem Relation Age of Onset   Stroke Mother    Asthma Mother    Anuerysm Mother    Obesity Father    Diabetes Sister    Heart disease Maternal Grandmother    Angina Maternal Grandmother    Suicidality Maternal Grandfather    Cancer Paternal Grandmother    Colon cancer Neg Hx    Colon polyps Neg  Hx    Esophageal cancer Neg Hx    Rectal cancer Neg Hx    Stomach cancer Neg Hx    Pancreatic cancer Neg Hx    Liver disease Neg Hx    Past Surgical History:  Procedure Laterality Date   ABDOMINAL HYSTERECTOMY     ABDOMINAL HYSTERECTOMY     APPENDECTOMY     BLADDER REPAIR     BREAST BIOPSY Right    needle core biopsy, benign   CATARACT EXTRACTION Left    CHOLECYSTECTOMY     COLONOSCOPY     CORONARY ANGIOPLASTY     GASTRIC BYPASS     POLYPECTOMY     REVERSE SHOULDER ARTHROPLASTY Right 07/15/2020   Procedure: REVERSE SHOULDER ARTHROPLASTY;  Surgeon: Justice Britain, MD;  Location: [facility];  Service: Orthopedics;  Laterality: Right;  173mn   TONSILLECTOMY AND ADENOIDECTOMY     Social History   Social History  Narrative   Daily caffeine   Immunization History  Administered Date(s) Administered   Influenza Split 09/08/2011, 08/25/2012   Influenza Whole 08/26/2010   Influenza, High Dose Seasonal PF 08/06/2016, 08/05/2017, 10/31/2018   Influenza, Quadrivalent, Recombinant, Inj, Pf 09/07/2019   Influenza,inj,Quad PF,6+ Mos 08/09/2014, 08/08/2015   Influenza,inj,quad, With Preservative 08/01/2017   Moderna Sars-Covid-2 Vaccination 12/11/2019, 01/08/2020, 10/14/2020, 03/03/2021   Pneumococcal Conjugate-13 12/27/2014   Pneumococcal Polysaccharide-23 08/05/2010, 08/05/2017   Tdap 07/12/2011   Zoster Recombinat (Shingrix) 09/07/2019   Zoster, Live 07/21/2011     Objective: Vital Signs: BP 123/71 (BP Location: Right Arm, Patient Position: Sitting, Cuff Size: Normal)   Pulse 74   Resp 16   Ht 5' 7.25" (1.708 m)   Wt 176 lb (79.8 kg)   BMI 27.36 kg/m    Physical Exam HENT:     Mouth/Throat:     Mouth: Mucous membranes are moist.     Pharynx: Oropharynx is clear.  Eyes:     Conjunctiva/sclera: Conjunctivae normal.  Cardiovascular:     Rate and Rhythm: Normal rate and regular rhythm.  Pulmonary:     Effort: Pulmonary effort is normal.     Breath sounds: Normal breath  sounds.  Skin:    General: Skin is warm and dry.     Comments: Few ecchymosis on extensor surfaces Trace pedal edema left ankle  Neurological:     Mental Status: She is alert.  Psychiatric:        Mood and Affect: Mood normal.     Musculoskeletal Exam:  Neck full ROM no tenderness Shoulders full ROM no tenderness or swelling Elbows full ROM no tenderness or swelling Wrists full ROM no tenderness or swelling Fingers full ROM no tenderness or swelling, Dupuytren's contracture proximal to the left second and third MCPs Knees some patellofemoral crepitus and joint line tenderness with no palpable effusions  Investigation: No additional findings.  Imaging: No results found.  Recent Labs: Lab Results  Component Value Date   WBC 5.0 04/28/2021   HGB 11.2 (L) 04/28/2021   PLT 186.0 04/28/2021   NA 137 04/28/2021   K 4.2 04/28/2021   CL 103 04/28/2021   CO2 27 04/28/2021   GLUCOSE 95 04/28/2021   BUN 11 04/28/2021   CREATININE 0.66 04/28/2021   BILITOT 0.4 04/28/2021   ALKPHOS 61 04/28/2021   AST 20 04/28/2021   ALT 20 04/28/2021   PROT 6.1 04/28/2021   ALBUMIN 4.0 04/28/2021   CALCIUM 9.5 06/25/2021   GFRAA >60 07/09/2020    Speciality Comments: No specialty comments available.  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  Femur BMD in osteopenia range but FRAX calculation for 10 year risk is 18% for major osteoporotic and 5.8% for hip fracture certainly indication for  treatment. Previously on Prolia not a candidate for oral bisphosphonates.  Recheck vitamin D PTH and calcium she has had recent basic metabolic panel looked fine earlier this year.  Plan to restart Prolia assuming normal labs.  Bariatric surgery status  Not a candidate for oral therapies due to history of bariatric surgery.  Vitamin D deficiency -  Plan: VITAMIN D 25 Hydroxy (Vit-D Deficiency, Fractures)  History of vitamin D deficiency and has osteoporosis on treatment check vitamin D level today.  She is currently taking daily supplementation.   Orders: Orders Placed This Encounter  Procedures   VITAMIN D 25 Hydroxy (Vit-D Deficiency, Fractures)   PTH, intact and calcium    No orders of the defined types were placed in this encounter.   Follow-Up Instructions: No follow-ups on file.   Collier Salina, MD  Note - This record has been created using [employer].  Chart creation errors have been sought, but may not always  have been located. Such creation errors do not reflect on  the standard of medical care.

## 2021-06-25 NOTE — Patient Instructions (Signed)
Denosumab injection What is this medication? DENOSUMAB (den oh sue mab) slows bone breakdown. Prolia is used to treat osteoporosis in women after menopause and in men, and in people who are taking corticosteroids for 6 months or more. Delton See is used to treat a high calcium level due to cancer and to prevent bone fractures and other bone problems caused by multiple myeloma or cancer bone metastases. Delton See is also used totreat giant cell tumor of the bone. This medicine may be used for other purposes; ask your health care provider orpharmacist if you have questions. COMMON BRAND NAME(S): Prolia, XGEVA What should I tell my care team before I take this medication? They need to know if you have any of these conditions: dental disease having surgery or tooth extraction infection kidney disease low levels of calcium or Vitamin D in the blood malnutrition on hemodialysis skin conditions or sensitivity thyroid or parathyroid disease an unusual reaction to denosumab, other medicines, foods, dyes, or preservatives pregnant or trying to get pregnant breast-feeding How should I use this medication? This medicine is for injection under the skin. It is given by a health careprofessional in a hospital or clinic setting. A special MedGuide will be given to you before each treatment. Be sure to readthis information carefully each time. For Prolia, talk to your pediatrician regarding the use of this medicine in children. Special care may be needed. For Delton See, talk to your pediatrician regarding the use of this medicine in children. While this drug may be prescribed for children as young as 13 years for selected conditions,precautions do apply. Overdosage: If you think you have taken too much of this medicine contact apoison control center or emergency room at once. NOTE: This medicine is only for you. Do not share this medicine with others. What if I miss a dose? It is important not to miss your dose. Call  your doctor or health careprofessional if you are unable to keep an appointment. What may interact with this medication? Do not take this medicine with any of the following medications: other medicines containing denosumab This medicine may also interact with the following medications: medicines that lower your chance of fighting infection steroid medicines like prednisone or cortisone This list may not describe all possible interactions. Give your health care provider a list of all the medicines, herbs, non-prescription drugs, or dietary supplements you use. Also tell them if you smoke, drink alcohol, or use illegaldrugs. Some items may interact with your medicine. What should I watch for while using this medication? Visit your doctor or health care professional for regular checks on your progress. Your doctor or health care professional may order blood tests andother tests to see how you are doing. Call your doctor or health care professional for advice if you get a fever, chills or sore throat, or other symptoms of a cold or flu. Do not treat yourself. This drug may decrease your body's ability to fight infection. Try toavoid being around people who are sick. You should make sure you get enough calcium and vitamin D while you are taking this medicine, unless your doctor tells you not to. Discuss the foods you eatand the vitamins you take with your health care professional. See your dentist regularly. Brush and floss your teeth as directed. Before youhave any dental work done, tell your dentist you are receiving this medicine. Do not become pregnant while taking this medicine or for 5 months after stopping it. Talk with your doctor or health care professional about your  birth control options while taking this medicine. Women should inform their doctor if they wish to become pregnant or think they might be pregnant. There is a potential for serious side effects to an unborn child. Talk to your health  careprofessional or pharmacist for more information. What side effects may I notice from receiving this medication? Side effects that you should report to your doctor or health care professionalas soon as possible: allergic reactions like skin rash, itching or hives, swelling of the face, lips, or tongue bone pain breathing problems dizziness jaw pain, especially after dental work redness, blistering, peeling of the skin signs and symptoms of infection like fever or chills; cough; sore throat; pain or trouble passing urine signs of low calcium like fast heartbeat, muscle cramps or muscle pain; pain, tingling, numbness in the hands or feet; seizures unusual bleeding or bruising unusually weak or tired Side effects that usually do not require medical attention (report to yourdoctor or health care professional if they continue or are bothersome): constipation diarrhea headache joint pain loss of appetite muscle pain runny nose tiredness upset stomach This list may not describe all possible side effects. Call your doctor for medical advice about side effects. You may report side effects to FDA at1-800-FDA-1088. Where should I keep my medication? This medicine is only given in a clinic, doctor's office, or other health caresetting and will not be stored at home. NOTE: This sheet is a summary. It may not cover all possible information. If you have questions about this medicine, talk to your doctor, pharmacist, orhealth care provider.  2022 Elsevier/Gold Standard (2018-03-24 16:10:44)

## 2021-06-26 ENCOUNTER — Telehealth: Payer: Self-pay | Admitting: Pharmacist

## 2021-06-26 DIAGNOSIS — M810 Age-related osteoporosis without current pathological fracture: Secondary | ICD-10-CM

## 2021-06-26 LAB — VITAMIN D 25 HYDROXY (VIT D DEFICIENCY, FRACTURES): Vit D, 25-Hydroxy: 32 ng/mL (ref 30–100)

## 2021-06-26 LAB — PTH, INTACT AND CALCIUM
Calcium: 9.5 mg/dL (ref 8.6–10.4)
PTH: 45 pg/mL (ref 16–77)

## 2021-06-26 NOTE — Telephone Encounter (Addendum)
Please start Prolia BIV through medical and pharmacy benefit.  Prolia [id number], [id number]  She is unable to try bisphosphonates due to history of bariatric surgery.  She was previously on Prolia but unsure if at hospital or at clinic.   Baseline labs are already resulted and wnl to proceed once BIV completed  ----- Message from Collier Salina, MD sent at 06/26/2021  2:38 PM EDT ----- Regarding: Prolia Restart Recommending Stacy Ayers for prolia she has been off treatment for a year but needs to restart q73monthdosing. Cannot take oral antiresorptives due to gastric bypass. Labs checked look okay.

## 2021-06-26 NOTE — Progress Notes (Signed)
Vitamin D and calcium level are normal with her current supplementation. Will order for prolia to get restarted on this.

## 2021-06-30 NOTE — Telephone Encounter (Signed)
Submitted a medical benefit Prior Authorization request to [facility] for [place] via CoverMyMeds. Will update once we receive a response.   Key: [id number]

## 2021-07-02 ENCOUNTER — Other Ambulatory Visit (HOSPITAL_COMMUNITY): Payer: Self-pay

## 2021-07-02 NOTE — Telephone Encounter (Signed)
Received notification from [facility] regarding a prior authorization for [place]. Authorization has been APPROVED from 06/30/2021 to 11/28/2021.   Patient can fill through [place]: [phone]   Authorization # [id number] Phone # [phone]   Test claim revealed that insurance covers $1,516.56, leaving patient with a copay of $64.00.

## 2021-07-07 ENCOUNTER — Other Ambulatory Visit (HOSPITAL_COMMUNITY): Payer: Self-pay

## 2021-07-07 MED ORDER — DENOSUMAB 60 MG/ML ~~LOC~~ SOSY
60.0000 mg | PREFILLED_SYRINGE | SUBCUTANEOUS | 1 refills | Status: DC
Start: 2021-07-07 — End: 2022-07-26
  Filled 2021-07-07 – 2021-07-08 (×2): qty 1, 180d supply, fill #0
  Filled 2022-01-08: qty 1, 180d supply, fill #1

## 2021-07-07 NOTE — Telephone Encounter (Signed)
Patient amenable to Prolia copay of $64. Patient provided payment information which I've communicated to [facility]. Rx sent to [facility] to be couriered to clinic. Once received, will schedule Prolia appointment.  Knox Saliva, PharmD, MPH, BCPS Clinical Pharmacist (Rheumatology and Pulmonology)

## 2021-07-08 ENCOUNTER — Other Ambulatory Visit (HOSPITAL_COMMUNITY): Payer: Self-pay

## 2021-07-09 NOTE — Telephone Encounter (Signed)
Attempted to contact the patient and left message for patient to call the office.  

## 2021-07-09 NOTE — Telephone Encounter (Signed)
Prolia received by clinic from [facility]. Placed in refrigerator.  Routing to Gwenlyn Perking, LPN - patient can be scheduled for Prolia restart appointment  Knox Saliva, PharmD, MPH, BCPS Clinical Pharmacist (Rheumatology and Pulmonology)

## 2021-07-13 DIAGNOSIS — Z96611 Presence of right artificial shoulder joint: Secondary | ICD-10-CM | POA: Diagnosis not present

## 2021-07-13 NOTE — Telephone Encounter (Signed)
Patient scheduled for 07/15/2021 at 3:00 pm for Prolia.

## 2021-07-15 ENCOUNTER — Ambulatory Visit (INDEPENDENT_AMBULATORY_CARE_PROVIDER_SITE_OTHER): Payer: Medicare PPO | Admitting: *Deleted

## 2021-07-15 ENCOUNTER — Other Ambulatory Visit: Payer: Self-pay

## 2021-07-15 VITALS — BP 123/75 | HR 74

## 2021-07-15 DIAGNOSIS — M810 Age-related osteoporosis without current pathological fracture: Secondary | ICD-10-CM

## 2021-07-15 MED ORDER — DENOSUMAB 60 MG/ML ~~LOC~~ SOSY
60.0000 mg | PREFILLED_SYRINGE | Freq: Once | SUBCUTANEOUS | Status: AC
  Administered 2021-07-15: 60 mg via SUBCUTANEOUS

## 2021-07-15 NOTE — Progress Notes (Signed)
Pharmacy Note  Subjective:   Patient presents to clinic today to receive bi-annual dose of Prolia.  Patient running a fever or have signs/symptoms of infection? No  Patient currently on antibiotics for the treatment of infection? No  Patient had fall in the last 6 months?  No  If yes, did it require medical attention? No   Patient taking calcium 1200 mg daily through diet or supplement and at least 800 units vitamin D? Yes  Objective: CMP     Component Value Date/Time   NA 137 04/28/2021 1407   NA 139 04/18/2017 1517   K 4.2 04/28/2021 1407   CL 103 04/28/2021 1407   CO2 27 04/28/2021 1407   GLUCOSE 95 04/28/2021 1407   BUN 11 04/28/2021 1407   BUN 14 04/18/2017 1517   CREATININE 0.66 04/28/2021 1407   CALCIUM 9.5 06/25/2021 1025   PROT 6.1 04/28/2021 1407   PROT 7.2 04/18/2017 1517   ALBUMIN 4.0 04/28/2021 1407   ALBUMIN 4.5 04/18/2017 1517   AST 20 04/28/2021 1407   ALT 20 04/28/2021 1407   ALKPHOS 61 04/28/2021 1407   BILITOT 0.4 04/28/2021 1407   BILITOT 0.2 04/18/2017 1517   GFRNONAA >60 07/09/2020 0859   GFRAA >60 07/09/2020 0859    CBC    Component Value Date/Time   WBC 5.0 04/28/2021 1407   RBC 3.64 (L) 04/28/2021 1407   HGB 11.2 (L) 04/28/2021 1407   HGB 12.6 05/17/2006 1039   HCT 33.4 (L) 04/28/2021 1407   HCT 36.5 05/17/2006 1039   PLT 186.0 04/28/2021 1407   PLT 155 05/17/2006 1039   MCV 91.7 04/28/2021 1407   MCV 90.5 05/17/2006 1039   MCH 32.6 07/09/2020 0859   MCHC 33.4 04/28/2021 1407   RDW 16.0 (H) 04/28/2021 1407   RDW 14.7 (H) 05/17/2006 1039   LYMPHSABS 1.3 04/28/2021 1407   LYMPHSABS 1.1 05/17/2006 1039   MONOABS 0.3 04/28/2021 1407   MONOABS 0.3 05/17/2006 1039   EOSABS 0.1 04/28/2021 1407   EOSABS 0.1 05/17/2006 1039   BASOSABS 0.0 04/28/2021 1407   BASOSABS 0.0 05/17/2006 1039    Lab Results  Component Value Date   VD25OH 32 06/25/2021    T-score: -3.8. 05/18/2021  Assessment/Plan:   Administrations This Visit      denosumab (PROLIA) injection 60 mg     Admin Date 07/15/2021 Action Given Dose 60 mg Route Subcutaneous Administered By Carole Binning, LPN             Patient tolerated injection well.     All questions encouraged and answered.  Instructed patient to call with any further questions or concerns.

## 2021-07-24 ENCOUNTER — Other Ambulatory Visit: Payer: Self-pay

## 2021-07-24 ENCOUNTER — Ambulatory Visit: Payer: Medicare PPO | Admitting: Family Medicine

## 2021-07-24 ENCOUNTER — Encounter: Payer: Self-pay | Admitting: Family Medicine

## 2021-07-24 VITALS — BP 140/80 | HR 81 | Temp 99.4°F | Resp 18 | Ht 67.25 in | Wt 173.4 lb

## 2021-07-24 DIAGNOSIS — F320 Major depressive disorder, single episode, mild: Secondary | ICD-10-CM

## 2021-07-24 NOTE — Assessment & Plan Note (Addendum)
con't effexor  D/c wellbutrin  Pt does not want to add anything else

## 2021-07-24 NOTE — Progress Notes (Signed)
Subjective:   By signing my name below, I, Stacy Ayers, attest that this documentation has been prepared under the direction and in the presence of [name], DO. 07/24/2021   Patient ID: Stacy Ayers, female    DOB: [birth year], 80 y.o.   MRN: [id number]  Chief Complaint  Patient presents with   Medication Management    Pt states wellbutrin XL is causing her not to sleep. Pt state med every other week. Pt states headaches, dizziness, and change of appetite.     HPI Patient is in today for an office visit.  She reports that she would like to stop taking 150 mg bupropion PO daily because she was experiencing side effects.  At her last lab work, her iron was low and she had been trying some vitamins from [employer] but was not tolerating them.   She has been managing her mood with 75 mg Effexor-XR PO daily and is doing well on it. When she tried to increase the dosage, she started feeling drowsy.  She has 4 Moderna Covid-19 vaccines. She is interested in getting a new pneumonia vaccine. She is UTD on the shingles vaccine. She is due for the tetanus vaccine.  Past Medical History:  Diagnosis Date   Abnormal liver function tests    Allergy    Anxiety    Arthritis    knees, lower back    Asthma    Cataract    left eye growing cataract    Depression    Diabetes mellitus    had gastric bypass DM resolved-    Diverticulosis    GERD (gastroesophageal reflux disease)    Headache    Previous migraines   Heart murmur    History of colon polyps    adenomatous   Hyperlipidemia    past hx- resloved after gastric bypass    Hypertension    Hypothyroidism    IBS (irritable bowel syndrome)    Iron deficiency anemia    Osteoporosis     Past Surgical History:  Procedure Laterality Date   ABDOMINAL HYSTERECTOMY     ABDOMINAL HYSTERECTOMY     APPENDECTOMY     BLADDER REPAIR     BREAST BIOPSY Right    needle core biopsy, benign   CATARACT EXTRACTION Left     CHOLECYSTECTOMY     COLONOSCOPY     CORONARY ANGIOPLASTY     GASTRIC BYPASS     POLYPECTOMY     REVERSE SHOULDER ARTHROPLASTY Right 07/15/2020   Procedure: REVERSE SHOULDER ARTHROPLASTY;  Surgeon: Justice Britain, MD;  Location: [facility];  Service: Orthopedics;  Laterality: Right;  176mn   TONSILLECTOMY AND ADENOIDECTOMY      Family History  Problem Relation Age of Onset   Stroke Mother    Asthma Mother    Anuerysm Mother    Obesity Father    Diabetes Sister    Heart disease Maternal Grandmother    Angina Maternal Grandmother    Suicidality Maternal Grandfather    Cancer Paternal Grandmother    Colon cancer Neg Hx    Colon polyps Neg Hx    Esophageal cancer Neg Hx    Rectal cancer Neg Hx    Stomach cancer Neg Hx    Pancreatic cancer Neg Hx    Liver disease Neg Hx     Social History   Socioeconomic History   Marital status: Single    Spouse name: Not on file   Number of children: 1  Years of education: Not on file   Highest education level: Not on file  Occupational History   Occupation: retired Product manager: RETIRED  Tobacco Use   Smoking status: Former    Packs/day: 0.30    Years: 2.00    Pack years: 0.60    Types: Cigarettes    Quit date: 07/11/1962    Years since quitting: 59.0   Smokeless tobacco: Never  Vaping Use   Vaping Use: Never used  Substance and Sexual Activity   Alcohol use: Not Currently    Comment: None in 7 weeks   Drug use: No   Sexual activity: Yes    Birth control/protection: Surgical    Comment: Hysterectomy  Other Topics Concern   Not on file  Social History Narrative   Daily caffeine   Social Determinants of Health   Financial Resource Strain: Not on file  Food Insecurity: Not on file  Transportation Needs: Not on file  Physical Activity: Not on file  Stress: Not on file  Social Connections: Not on file  Intimate Partner Violence: Not on file    Outpatient Medications Prior to Visit  Medication Sig Dispense Refill    acetaminophen (TYLENOL) 650 MG CR tablet Take 650 mg by mouth every 8 (eight) hours as needed for pain.     albuterol (PROVENTIL HFA) 108 (90 Base) MCG/ACT inhaler Inhale 2 puffs into the lungs every 6 (six) hours as needed. 1 each 3   cholecalciferol (VITAMIN D) 1000 UNITS tablet Take 1,000 Units by mouth daily.     cyclobenzaprine (FLEXERIL) 10 MG tablet Take 1 tablet (10 mg total) by mouth 3 (three) times daily as needed for muscle spasms. 30 tablet 1   denosumab (PROLIA) 60 MG/ML SOSY injection Inject 60 mg into the skin every 6 (six) months. Deliver to [address], [place] [address] 1 mL 1   docusate sodium (COLACE) 100 MG capsule Take 100 mg by mouth 2 (two) times daily as needed for mild constipation.     fluticasone (FLONASE) 50 MCG/ACT nasal spray Place 2 sprays into both nostrils daily. 16 g 5   levothyroxine (SYNTHROID) 137 MCG tablet Take 1 tablet (137 mcg total) by mouth M,W,F. Take 150 mcg all other days. 90 tablet 3   levothyroxine (SYNTHROID) 150 MCG tablet Take 1 tablet (150 mcg total) by mouth daily before breakfast. 90 tablet 3   loperamide (IMODIUM) 2 MG capsule Take 2 mg by mouth as needed for diarrhea or loose stools.      loratadine (CLARITIN) 10 MG tablet TAKE 1 TABLET ONCE DAILY. 30 tablet 4   losartan (COZAAR) 50 MG tablet Take 1 tablet (50 mg total) by mouth daily. 90 tablet 1   metoprolol tartrate (LOPRESSOR) 25 MG tablet metoprolol tartrate 25 mg tablet     naproxen (NAPROSYN) 500 MG tablet Naprosyn 500 mg tablet   500 mg by oral route.     pantoprazole (PROTONIX) 40 MG tablet Take 1 tablet (40 mg total) by mouth daily. 90 tablet 03   Probiotic Product (PROBIOTIC DAILY) CAPS Take 1 capsule by mouth daily.     venlafaxine XR (EFFEXOR-XR) 75 MG 24 hr capsule TAKE 1 CAPSULE ONCE DAILY WITH BREAKFAST. 90 capsule 1   vitamin B-12 (CYANOCOBALAMIN) 1000 MCG tablet Take 1,000 mcg by mouth daily.     buPROPion (WELLBUTRIN XL) 150 MG 24 hr tablet Take 1  tablet (150 mg total) by mouth daily. 60 tablet 2  No facility-administered medications prior to visit.    Allergies  Allergen Reactions   Sulfonamide Derivatives Diarrhea   Cocaine     Other reaction(s): Hallucination Sinus irrigation   Montelukast Sodium     UNKNOWN   Penicillins     Local skin reaction.  Tolerated ancef 07/15/2020    Pioglitazone     UNKNOWN    Rosiglitazone Maleate     UNKNOWN   Secobarbital Sodium     UNKNOWN   Codeine Rash   Doxycycline Nausea Only    REACTION: GI UPSET    Review of Systems  Constitutional:  Negative for fever and malaise/fatigue.  HENT:  Negative for congestion.   Eyes:  Negative for blurred vision.  Respiratory:  Negative for shortness of breath.   Cardiovascular:  Negative for chest pain, palpitations and leg swelling.  Gastrointestinal:  Negative for abdominal pain, blood in stool and nausea.  Genitourinary:  Negative for dysuria and frequency.  Musculoskeletal:  Negative for falls.  Skin:  Negative for rash.  Neurological:  Negative for dizziness, loss of consciousness and headaches.  Endo/Heme/Allergies:  Negative for environmental allergies.  Psychiatric/Behavioral:  Negative for depression. The patient is not nervous/anxious.       Objective:    Physical Exam Vitals and nursing note reviewed.  Constitutional:      Appearance: She is well-developed.  HENT:     Head: Normocephalic and atraumatic.  Eyes:     Conjunctiva/sclera: Conjunctivae normal.  Neck:     Thyroid: No thyromegaly.     Vascular: No carotid bruit or JVD.  Cardiovascular:     Rate and Rhythm: Normal rate and regular rhythm.     Heart sounds: Normal heart sounds. No murmur heard. Pulmonary:     Effort: Pulmonary effort is normal. No respiratory distress.     Breath sounds: Normal breath sounds. No wheezing or rales.  Chest:     Chest wall: No tenderness.  Musculoskeletal:     Cervical back: Normal range of motion and neck supple.   Neurological:     Mental Status: She is alert and oriented to person, place, and time.    BP 140/80 (BP Location: Right Arm, Patient Position: Sitting, Cuff Size: Normal)   Pulse 81   Temp 99.4 F (37.4 C) (Oral)   Resp 18   Ht 5' 7.25" (1.708 m)   Wt 173 lb 6.4 oz (78.7 kg)   SpO2 98%   BMI 26.96 kg/m  Wt Readings from Last 3 Encounters:  07/24/21 173 lb 6.4 oz (78.7 kg)  06/25/21 176 lb (79.8 kg)  04/28/21 174 lb 12.8 oz (79.3 kg)    Diabetic Foot Exam - Simple   No data filed    Lab Results  Component Value Date   WBC 5.0 04/28/2021   HGB 11.2 (L) 04/28/2021   HCT 33.4 (L) 04/28/2021   PLT 186.0 04/28/2021   GLUCOSE 95 04/28/2021   CHOL 179 11/06/2020   TRIG 139.0 11/06/2020   HDL 42.20 11/06/2020   LDLDIRECT 147.0 05/28/2019   LDLCALC 109 (H) 11/06/2020   ALT 20 04/28/2021   AST 20 04/28/2021   NA 137 04/28/2021   K 4.2 04/28/2021   CL 103 04/28/2021   CREATININE 0.66 04/28/2021   BUN 11 04/28/2021   CO2 27 04/28/2021   TSH 1.46 03/23/2021   INR Positive 04/01/2017   HGBA1C 6.1 11/06/2020    Lab Results  Component Value Date   TSH 1.46 03/23/2021   Lab Results  Component Value Date   WBC 5.0 04/28/2021   HGB 11.2 (L) 04/28/2021   HCT 33.4 (L) 04/28/2021   MCV 91.7 04/28/2021   PLT 186.0 04/28/2021   Lab Results  Component Value Date   NA 137 04/28/2021   K 4.2 04/28/2021   CO2 27 04/28/2021   GLUCOSE 95 04/28/2021   BUN 11 04/28/2021   CREATININE 0.66 04/28/2021   BILITOT 0.4 04/28/2021   ALKPHOS 61 04/28/2021   AST 20 04/28/2021   ALT 20 04/28/2021   PROT 6.1 04/28/2021   ALBUMIN 4.0 04/28/2021   CALCIUM 9.5 06/25/2021   ANIONGAP 7 07/09/2020   GFR 84.31 04/28/2021   Lab Results  Component Value Date   CHOL 179 11/06/2020   Lab Results  Component Value Date   HDL 42.20 11/06/2020   Lab Results  Component Value Date   LDLCALC 109 (H) 11/06/2020   Lab Results  Component Value Date   TRIG 139.0 11/06/2020   Lab Results   Component Value Date   CHOLHDL 4 11/06/2020   Lab Results  Component Value Date   HGBA1C 6.1 11/06/2020       Assessment & Plan:   Problem List Items Addressed This Visit       Unprioritized   Depression, major, single episode, mild ([place]) - Primary    con't effexor  D/c wellbutrin  Pt does not want to add anything else         No orders of the defined types were placed in this encounter.   I,Stacy Ayers,acting as a Education administrator for [employer], DO.,have documented all relevant documentation on the behalf of [name], DO,as directed by  [name], DO while in the presence of [name], [place], DO., personally preformed the services described in this documentation.  All medical record entries made by the scribe were at my direction and in my presence.  I have reviewed the chart and discharge instructions (if applicable) and agree that the record reflects my personal performance and is accurate and complete. 07/24/2021

## 2021-07-24 NOTE — Patient Instructions (Signed)
Major Depressive Disorder, Adult Major depressive disorder (MDD) is a mental health condition. It may also be called clinical depression or unipolar depression. MDD causes symptoms of sadness, hopelessness, and loss of interest in things. These symptoms last most of the day, almost every day, for 2 weeks. MDD can also cause physical symptoms. It can interfere with relationships and with everyday activities,such as work, school, and activities that are usually pleasant. MDD may be mild, moderate, or severe. It may be single-episode MDD, whichhappens once, or recurrent MDD, which may occur multiple times. What are the causes? The exact cause of this condition is not known. MDD is most likely caused by a combination of things, which may include: Your personality traits. Learned or conditioned behaviors or thoughts or feelings that reinforce negativity. Any alcohol or substance misuse. Long-term (chronic) physical or mental health illness. Going through a traumatic experience or major life changes. What increases the risk? The following factors may make someone more likely to develop MDD: A family history of depression. Being a woman. Troubled family relationships. Abnormally low levels of certain brain chemicals. Traumatic or painful events in childhood, especially abuse or loss of a parent. A lot of stress from life experiences, such as poor living conditions or discrimination. Chronic physical illness or other mental health disorders. What are the signs or symptoms? The main symptoms of MDD usually include: Constant depressed or irritable mood. A loss of interest in things and activities. Other symptoms include: Sleeping or eating too much or too little. Unexplained weight gain or weight loss. Tiredness or low energy. Being agitated, restless, or weak. Feeling hopeless, worthless, or guilty. Trouble thinking clearly or making decisions. Thoughts of suicide or thoughts of harming  others. Isolating oneself or avoiding other people or activities. Trouble completing tasks, work, or any normal obligations. Severe symptoms of this condition may include: Psychotic depression.This may include false beliefs, or delusions. It may also include seeing, hearing, tasting, smelling, or feeling things that are not real (hallucinations). Chronic depression or persistent depressive disorder. This is low-level depression that lasts for at least 2 years. Melancholic depression, or feeling extremely sad and hopeless. Catatonic depression, which includes trouble speaking and trouble moving. How is this diagnosed? This condition may be diagnosed based on: Your symptoms. Your medical and mental health history. You may be asked questions about your lifestyle, including any drug and alcohol use. A physical exam. Blood tests to rule out other conditions. MDD is confirmed if you have the following symptoms most of the day, nearly every day, in a 2-week period: Either a depressed mood or loss of interest. At least four other MDD symptoms. How is this treated? This condition is usually treated by mental health professionals, such as psychologists, psychiatrists, and clinical social workers. You may need more than one type of treatment. Treatment may include: Psychotherapy, also called talk therapy or counseling. Types of psychotherapy include: Cognitive behavioral therapy (CBT). This teaches you to recognize unhealthy feelings, thoughts, and behaviors, and replace them with positive thoughts and actions. Interpersonal therapy (IPT). This helps you to improve the way you communicate with others or relate to them. Family therapy. This treatment includes members of your family. Medicines to treat anxiety and depression. These medicines help to balance the brain chemicals that affect your emotions. Lifestyle changes. You may be asked to: Limit alcohol use and avoid drug use. Get regular  exercise. Get plenty of sleep. Make healthy eating choices. Spend more time outdoors. Brain stimulation. This may be done   if symptoms are very severe and other treatments have not worked. Examples of this treatment are electroconvulsive therapy and transcranial magnetic stimulation. Follow these instructions at home: Activity Exercise regularly and spend time outdoors. Find activities that you enjoy doing, and make time to do them. Find healthy ways to manage stress, such as: Meditation or deep breathing. Spending time in nature. Journaling. Return to your normal activities as told by your health care provider. Ask your health care provider what activities are safe for you. Alcohol and drug use If you drink alcohol: Limit how much you use to: 0-1 drink a day for women who are not pregnant. 0-2 drinks a day for men. Be aware of how much alcohol is in your drink. In the [place] one drink equals one 12 oz bottle of beer (355 mL), one 5 oz glass of wine (148 mL), or one 1 oz glass of hard liquor (44 mL). Discuss your alcohol use with your health care provider. Alcohol can affect any antidepressant medicines you are taking. Discuss any drug use with your health care provider. General instructions  Take over-the-counter and prescription medicines only as told by your health care provider. Eat a healthy diet and get plenty of sleep. Consider joining a support group. Your health care provider may be able to recommend one. Keep all follow-up visits as told by your health care provider. This is important.  Where to find more information National Alliance on Mental Illness: www.nami.org [place] National Institute of Mental Health: www.nimh.nih.gov Contact a health care provider if: Your symptoms get worse. You develop new symptoms. Get help right away if: You self-harm. You have serious thoughts about hurting yourself or others. You hallucinate. If you ever feel like you may hurt yourself or  others, or have thoughts about taking your own life, get help right away. Go to your nearest emergency department or: Call your local emergency services (911 in the [place] Call a suicide crisis helpline, such as the National Suicide Prevention Lifeline at 1-800-273-8255. This is open 24 hours a day in the [place] Text the Crisis Text Line at 741741 (in the [place] Summary Major depressive disorder (MDD) is a mental health condition. MDD causes symptoms of sadness, hopelessness, and loss of interest in things. These symptoms last most of the day, almost every day, for 2 weeks. The symptoms of MDD can interfere with relationships and with everyday activities. Treatments and support are available for people who develop MDD. You may need more than one type of treatment. Get help right away if you have serious thoughts about hurting yourself or others. This information is not intended to replace advice given to you by your health care provider. Make sure you discuss any questions you have with your healthcare provider. Document Revised: 10/27/2019 Document Reviewed: 10/27/2019 Elsevier Patient Education  2022 [employer]  

## 2021-07-31 DIAGNOSIS — M1712 Unilateral primary osteoarthritis, left knee: Secondary | ICD-10-CM | POA: Diagnosis not present

## 2021-08-06 DIAGNOSIS — M1712 Unilateral primary osteoarthritis, left knee: Secondary | ICD-10-CM | POA: Diagnosis not present

## 2021-08-17 LAB — HEMOGLOBIN A1C: Hemoglobin A1C: 5.5

## 2021-08-17 LAB — BASIC METABOLIC PANEL
BUN: 9 (ref 4–21)
CO2: 26 — AB (ref 13–22)
Chloride: 102 (ref 99–108)
Creatinine: 0.7 (ref 0.5–1.1)
Potassium: 5.1 (ref 3.4–5.3)
Sodium: 138 (ref 137–147)

## 2021-08-17 LAB — COMPREHENSIVE METABOLIC PANEL
Albumin: 4.1 (ref 3.5–5.0)
Calcium: 8.7 (ref 8.7–10.7)
Globulin: 2.2

## 2021-08-17 LAB — HEPATIC FUNCTION PANEL
ALT: 24 (ref 7–35)
AST: 27 (ref 13–35)
Alkaline Phosphatase: 72 (ref 25–125)
Bilirubin, Total: 0.5

## 2021-08-17 LAB — LIPID PANEL
Cholesterol: 169 (ref 0–200)
HDL: 53 (ref 35–70)
LDL Cholesterol: 94
Triglycerides: 127 (ref 40–160)

## 2021-08-17 LAB — TSH: TSH: 0.38 — AB (ref 0.41–5.90)

## 2021-08-19 ENCOUNTER — Encounter: Payer: Self-pay | Admitting: *Deleted

## 2021-09-16 DIAGNOSIS — F324 Major depressive disorder, single episode, in partial remission: Secondary | ICD-10-CM | POA: Diagnosis not present

## 2021-09-16 DIAGNOSIS — J45909 Unspecified asthma, uncomplicated: Secondary | ICD-10-CM | POA: Diagnosis not present

## 2021-09-16 DIAGNOSIS — M810 Age-related osteoporosis without current pathological fracture: Secondary | ICD-10-CM | POA: Diagnosis not present

## 2021-09-16 DIAGNOSIS — M1990 Unspecified osteoarthritis, unspecified site: Secondary | ICD-10-CM | POA: Diagnosis not present

## 2021-09-16 DIAGNOSIS — E039 Hypothyroidism, unspecified: Secondary | ICD-10-CM | POA: Diagnosis not present

## 2021-09-16 DIAGNOSIS — K219 Gastro-esophageal reflux disease without esophagitis: Secondary | ICD-10-CM | POA: Diagnosis not present

## 2021-09-16 DIAGNOSIS — G8929 Other chronic pain: Secondary | ICD-10-CM | POA: Diagnosis not present

## 2021-09-16 DIAGNOSIS — F419 Anxiety disorder, unspecified: Secondary | ICD-10-CM | POA: Diagnosis not present

## 2021-09-16 DIAGNOSIS — I10 Essential (primary) hypertension: Secondary | ICD-10-CM | POA: Diagnosis not present

## 2021-09-22 DIAGNOSIS — M25562 Pain in left knee: Secondary | ICD-10-CM | POA: Diagnosis not present

## 2021-09-22 NOTE — H&P (Addendum)
TOTAL KNEE ADMISSION H&P  Patient is being admitted for left total knee arthroplasty.  Subjective:  Chief Complaint: Left knee pain.  HPI: Stacy Ayers, 80 y.o. female has a history of pain and functional disability in the left knee due to arthritis and has failed non-surgical conservative treatments for greater than 12 weeks to include corticosteriod injections and activity modification. Onset of symptoms was gradual, starting  several  years ago with gradually worsening course since that time. The patient noted no past surgery on the left knee.  Patient currently rates pain in the left knee at 7 out of 10 with activity. Patient has night pain, worsening of pain with activity and weight bearing, pain with passive range of motion, and crepitus. Patient has evidence of  bone-on-bone arthritis in the medial and patellofemoral compartments of the left knee  by imaging studies. There is no active infection.  Patient Active Problem List   Diagnosis Date Noted   Depression, major, single episode, mild ([place]) 07/24/2021   Age-related osteoporosis without current pathological fracture 06/25/2021   Diabetes mellitus ([place]) 04/28/2021   DM (diabetes mellitus) type II, controlled, with peripheral vascular disorder ([place]) 04/28/2021   Morbid obesity (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   Seasonal allergies 08/11/2015   Alcohol dependence ([place]) 08/11/2015   CTS (carpal tunnel syndrome) 08/11/2015   Allergic rhinitis 12/19/2013   B12 deficiency 05/22/2013   Left foot pain 09/06/2012   Depression with anxiety 04/05/2012   PERSONAL HISTORY OF COLONIC POLYPS 01/18/2011   SPONTANEOUS ECCHYMOSES  10/13/2010   DIARRHEA, CHRONIC 10/13/2010   PHARYNGITIS, CHRONIC 02/16/2010   Hypothyroidism 01/14/2010   History of bariatric surgery 02/25/2009   Vitamin D deficiency 09/10/2008   VARICOSE VEINS, LOWER EXTREMITIES 09/10/2008   PULMONARY NODULE 08/14/2008   FATIGUE 08/14/2008   MUSCLE PAIN 06/14/2007   BLADDER PAIN 06/14/2007   COLONIC POLYPS 03/14/2007   History of diet-controlled diabetes 03/14/2007   Hyperlipidemia LDL goal <100 03/14/2007   ANEMIA-IRON DEFICIENCY 03/14/2007   Essential hypertension 03/14/2007   VOCAL CORD PARALYSIS 03/14/2007   EDEMA, LARYNX 03/14/2007   ASTHMA 03/14/2007   Esophageal reflux 03/14/2007   LIVER FUNCTION TESTS, ABNORMAL, HX OF 03/14/2007   TONSILLECTOMY AND ADENOIDECTOMY, HX OF 03/14/2007    Past Medical History:  Diagnosis Date   Abnormal liver function tests    Allergy    Anxiety    Arthritis    knees, lower back    Asthma    Cataract    left eye growing cataract    Depression    Diabetes mellitus    had gastric bypass DM resolved-    Diverticulosis    GERD (gastroesophageal reflux disease)    Headache    Previous migraines   Heart murmur    History of colon polyps    adenomatous   Hyperlipidemia    past hx- resloved after gastric bypass    Hypertension    Hypothyroidism    IBS (irritable bowel syndrome)    Iron deficiency anemia    Osteoporosis     Past Surgical History:  Procedure Laterality Date   ABDOMINAL HYSTERECTOMY     ABDOMINAL HYSTERECTOMY  APPENDECTOMY     BLADDER REPAIR     BREAST BIOPSY Right    needle core biopsy, benign   CATARACT EXTRACTION Left    CHOLECYSTECTOMY     COLONOSCOPY     CORONARY ANGIOPLASTY     GASTRIC BYPASS     POLYPECTOMY     REVERSE SHOULDER ARTHROPLASTY Right 07/15/2020   Procedure: REVERSE SHOULDER ARTHROPLASTY;  Surgeon: Justice Britain, MD;  Location: [facility];  Service: Orthopedics;  Laterality: Right;  165min   TONSILLECTOMY AND ADENOIDECTOMY      Prior to Admission  medications   Medication Sig Start Date End Date Taking? Authorizing Provider  acetaminophen (TYLENOL) 650 MG CR tablet Take 650 mg by mouth every 8 (eight) hours as needed for pain.    [provider]  albuterol (PROVENTIL HFA) 108 (90 Base) MCG/ACT inhaler Inhale 2 puffs into the lungs every 6 (six) hours as needed. 12/16/20   [name], DO  cholecalciferol (VITAMIN D) 1000 UNITS tablet Take 1,000 Units by mouth daily.    [provider]  cyclobenzaprine (FLEXERIL) 10 MG tablet Take 1 tablet (10 mg total) by mouth 3 (three) times daily as needed for muscle spasms. 07/15/20   Shuford, Olivia Mackie, PA-C  denosumab (PROLIA) 60 MG/ML SOSY injection Inject 60 mg into the skin every 6 (six) months. Deliver to [address], [place], [place] [address] 07/07/21   Collier Salina, MD  docusate sodium (COLACE) 100 MG capsule Take 100 mg by mouth 2 (two) times daily as needed for mild constipation.    [provider]  fluticasone (FLONASE) 50 MCG/ACT nasal spray Place 2 sprays into both nostrils daily. 12/22/20   [name], DO  levothyroxine (SYNTHROID) 137 MCG tablet Take 1 tablet (137 mcg total) by mouth M,W,F. Take 150 mcg all other days. 11/06/20   [name], DO  levothyroxine (SYNTHROID) 150 MCG tablet Take 1 tablet (150 mcg total) by mouth daily before breakfast. 11/06/20   Carollee Herter, Alferd Apa, DO  loperamide (IMODIUM) 2 MG capsule Take 2 mg by mouth as needed for diarrhea or loose stools.     [provider]  loratadine (CLARITIN) 10 MG tablet TAKE 1 TABLET ONCE DAILY. 06/13/20   [name], DO  losartan (COZAAR) 50 MG tablet Take 1 tablet (50 mg total) by mouth daily. 06/03/21   [name], DO  metoprolol tartrate (LOPRESSOR) 25 MG tablet metoprolol tartrate 25 mg tablet 04/18/17   [provider]  naproxen (NAPROSYN) 500 MG tablet Naprosyn 500 mg tablet   500 mg by oral route. 07/15/20   [provider]  pantoprazole (PROTONIX) 40 MG tablet Take 1 tablet (40 mg total) by mouth daily. 11/06/20   [name], DO  Probiotic Product (PROBIOTIC DAILY) CAPS Take 1 capsule by mouth daily.    [provider]  venlafaxine XR (EFFEXOR-XR) 75 MG 24 hr capsule TAKE 1 CAPSULE ONCE DAILY WITH BREAKFAST. 04/17/21   Carollee Herter, Alferd Apa, DO  vitamin B-12 (CYANOCOBALAMIN) 1000 MCG tablet Take 1,000 mcg by mouth daily.    [provider]    Allergies  Allergen Reactions   Sulfonamide Derivatives Diarrhea   Cocaine     Other reaction(s): Hallucination Sinus irrigation   Montelukast Sodium     UNKNOWN   Penicillins     Local skin reaction.  Tolerated ancef 07/15/2020    Pioglitazone     UNKNOWN  Rosiglitazone Maleate     UNKNOWN   Secobarbital Sodium     UNKNOWN   Codeine Rash   Doxycycline Nausea Only    REACTION: GI UPSET    Social History   Socioeconomic History   Marital status: Single    Spouse name: Not on file   Number of children: 1   Years of education: Not on file   Highest education level: Not on file  Occupational History   Occupation: retired Product manager: RETIRED  Tobacco Use   Smoking status: Former    Packs/day: 0.30    Years: 2.00    Pack years: 0.60    Types: Cigarettes    Quit date: 07/11/1962    Years since quitting: 59.2   Smokeless tobacco: Never  Vaping Use   Vaping Use: Never used  Substance and Sexual Activity   Alcohol use: Not Currently    Comment: None in 7 weeks   Drug use: No   Sexual activity: Yes    Birth control/protection: Surgical    Comment: Hysterectomy  Other Topics Concern   Not on file  Social History Narrative   Daily caffeine   Social Determinants of Health   Financial Resource Strain: Not on file  Food Insecurity: Not on file  Transportation Needs: Not on file  Physical Activity: Not on file  Stress: Not on file  Social Connections: Not on file  Intimate Partner Violence:  Not on file    Tobacco Use: Medium Risk   Smoking Tobacco Use: Former   Smokeless Tobacco Use: Never   Passive Exposure: Not on file   Social History   Substance and Sexual Activity  Alcohol Use Not Currently   Comment: None in 7 weeks    Family History  Problem Relation Age of Onset   Stroke Mother    Asthma Mother    Anuerysm Mother    Obesity Father    Diabetes Sister    Heart disease Maternal Grandmother    Angina Maternal Grandmother    Suicidality Maternal Grandfather    Cancer Paternal Grandmother    Colon cancer Neg Hx    Colon polyps Neg Hx    Esophageal cancer Neg Hx    Rectal cancer Neg Hx    Stomach cancer Neg Hx    Pancreatic cancer Neg Hx    Liver disease Neg Hx     Review of Systems  Constitutional:  Negative for chills and fever.  HENT:  Negative for congestion, sore throat and tinnitus.   Eyes:  Negative for double vision, photophobia and pain.  Respiratory:  Negative for cough, shortness of breath and wheezing.   Cardiovascular:  Negative for chest pain, palpitations and orthopnea.  Gastrointestinal:  Negative for heartburn, nausea and vomiting.  Genitourinary:  Negative for dysuria, frequency and urgency.  Musculoskeletal:  Positive for joint pain.  Neurological:  Negative for dizziness, weakness and headaches.   Objective:  Physical Exam: Well nourished and well developed.  General: Alert and oriented x3, cooperative and pleasant, no acute distress.  Head: normocephalic, atraumatic, neck supple.  Eyes: EOMI.  Respiratory: breath sounds clear in all fields, no wheezing, rales, or rhonchi. Cardiovascular: Regular rate and rhythm, no murmurs, gallops or rubs.  Abdomen: non-tender to palpation and soft, normoactive bowel sounds. Musculoskeletal:  Left Knee Exam:   No effusion.   Range of motion is 5-125 degrees.   Moderate crepitus on range of motion of the knee.   Positive medial greater than  lateral joint line tenderness.   Stable  knee.  Calves soft and nontender. Motor function intact in LE. Strength 5/5 LE bilaterally. Neuro: Distal pulses 2+. Sensation to light touch intact in LE.  Imaging Review Plain radiographs demonstrate severe degenerative joint disease of the left knee. The overall alignment is neutral. The bone quality appears to be adequate for age and reported activity level.  Assessment/Plan:  End stage arthritis, left knee   The patient history, physical examination, clinical judgment of the provider and imaging studies are consistent with end stage degenerative joint disease of the left knee and total knee arthroplasty is deemed medically necessary. The treatment options including medical management, injection therapy arthroscopy and arthroplasty were discussed at length. The risks and benefits of total knee arthroplasty were presented and reviewed. The risks due to aseptic loosening, infection, stiffness, patella tracking problems, thromboembolic complications and other imponderables were discussed. The patient acknowledged the explanation, agreed to proceed with the plan and consent was signed. Patient is being admitted for inpatient treatment for surgery, pain control, PT, OT, prophylactic antibiotics, VTE prophylaxis, progressive ambulation and ADLs and discharge planning. The patient is planning to be discharged  home .  Anticipated LOS equal to or greater than 2 midnights due to - Age 43 and older with one or more of the following:  - Obesity  - Expected need for hospital services (PT, OT, Nursing) required for safe  discharge  - Anticipated need for postoperative skilled nursing care or inpatient rehab  - Active co-morbidities: None OR   - Unanticipated findings during/Post Surgery: None  - Patient is a high risk of re-admission due to: None  Therapy Plans: [facility] Disposition: SNF Planned DVT Prophylaxis: Xarelto 10 mg QD DME Needed: [place], 3-in-1 PCP: Roma Schanz, DO  (clearance received) TXA: IV Allergies: Codeine (rash), doxycycline (nausea), PCN (local), sulfa (diarrhea) Anesthesia Concerns: None BMI: 26.8 Last HgbA1c: Not diabetic Pharmacy: [facility]  Other:  - History of gastric bypass - Xarelto, hydromorphone, methocarbamol - On discharge summary, do not continue vitamins/supplements to avoid [facility] at [facility]  - Patient was instructed on what medications to stop prior to surgery. - Follow-up visit in 2 weeks with Dr. Wynelle Link - Begin physical therapy following surgery - Pre-operative lab work as pre-surgical testing - Prescriptions will be provided in hospital at time of discharge  Theresa Duty, PA-C Orthopedic Surgery [facility] [place]

## 2021-10-06 ENCOUNTER — Other Ambulatory Visit: Payer: Self-pay | Admitting: Family Medicine

## 2021-10-06 ENCOUNTER — Telehealth: Payer: Self-pay | Admitting: Family Medicine

## 2021-10-06 DIAGNOSIS — E039 Hypothyroidism, unspecified: Secondary | ICD-10-CM

## 2021-10-06 NOTE — Telephone Encounter (Signed)
Patient brought in form for [place] to fill out Patient would like it faxed to the number on page [phone]

## 2021-10-07 NOTE — Patient Instructions (Signed)
DUE TO COVID-19 ONLY ONE VISITOR IS ALLOWED TO COME WITH YOU AND STAY IN THE WAITING ROOM ONLY DURING PRE OP AND PROCEDURE.   **NO VISITORS ARE ALLOWED IN THE SHORT STAY AREA OR RECOVERY ROOM!!**  IF YOU WILL BE ADMITTED INTO THE HOSPITAL YOU ARE ALLOWED ONLY TWO SUPPORT PEOPLE DURING VISITATION HOURS ONLY (7 AM -8PM)   The support person(s) must pass our screening, gel in and out, and wear a mask at all times, including in the patient's room. Patients must also wear a mask when staff or their support person are in the room. Visitors GUEST BADGE MUST BE WORN VISIBLY  One adult visitor may remain with you overnight and MUST be in the room by 8 P.M.  No visitors under the age of 12. Any visitor under the age of 62 must be accompanied by an adult.    COVID SWAB TESTING MUST BE COMPLETED ON: 10/15/21  **MUST PRESENT COMPLETED FORM AT TESTING SITE**    [place] (backside of the building) You are not required to quarantine, however you are required to wear a well-fitted mask when you are out and around people not in your household.  Hand Hygiene often Do NOT share personal items Notify your provider if you are in close contact with someone who has COVID or you develop fever 100.4 or greater, new onset of sneezing, cough, sore throat, shortness of breath or body aches.  [place] [address], [address], must go inside of the hospital, NOT A DRIVE THRU!  (Must self quarantine after testing. Follow instructions on handout.)       Your procedure is scheduled on: 10/19/21   Report to [facility] Main Entrance    Report to short stay at: 5:45 AM   Call this number if you have problems the morning of surgery [phone]   Do not eat food :After Midnight.   May have liquids until : 5:15 AM   day of surgery  CLEAR LIQUID DIET  Foods Allowed                                                                      Foods Excluded  Water, Black Coffee and tea, regular and decaf                             liquids that you cannot  Plain Jell-O in any flavor  (No red)                                           see through such as: Fruit ices (not with fruit pulp)                                     milk, soups, orange juice              Iced Popsicles (No red)  All solid food                                   Apple juices Sports drinks like Gatorade (No red) Lightly seasoned clear broth or consume(fat free) Sugar  Sample Menu Breakfast                                Lunch                                     Supper Cranberry juice                    Beef broth                            Chicken broth Jell-O                                     Grape juice                           Apple juice Coffee or tea                        Jell-O                                      Popsicle                                                Coffee or tea                        Coffee or tea      Complete one Gatorade drink the morning of surgery at: 5:15 AM       the day of surgery     The day of surgery:  Drink ONE (1) Pre-Surgery Clear Ensure or G2 by am the morning of surgery. Drink in one sitting. Do not sip.  This drink was given to you during your hospital  pre-op appointment visit. Nothing else to drink after completing the  Pre-Surgery Clear Ensure or G2.          If you have questions, please contact your surgeon's office.     Oral Hygiene is also important to reduce your risk of infection.                                    Remember - BRUSH YOUR TEETH THE MORNING OF SURGERY WITH YOUR REGULAR TOOTHPASTE   Do NOT smoke after Midnight   Take these medicines the morning of surgery with A SIP OF WATER: metoprolol,synthroid,pantoprazole.Use inhalers as usual.Loratadine and Flonase as needed.  DO NOT TAKE ANY ORAL DIABETIC MEDICATIONS DAY OF YOUR SURGERY  You may not have any metal on your body including hair pins, jewelry, and body piercing             Do not wear make-up, lotions, powders, perfumes/cologne, or deodorant  Do not wear nail polish including gel and S&S, artificial/acrylic nails, or any other type of covering on natural nails including finger and toenails. If you have artificial nails, gel coating, etc. that needs to be removed by a nail salon please have this removed prior to surgery or surgery may need to be canceled/ delayed if the surgeon/ anesthesia feels like they are unable to be safely monitored.   Do not shave  48 hours prior to surgery.    Do not bring valuables to the hospital. [place].   Contacts, dentures or bridgework may not be worn into surgery.   Bring small overnight bag day of surgery.    Patients discharged on the day of surgery will not be allowed to drive home.   Special Instructions: Bring a copy of your healthcare power of attorney and living will documents         the day of surgery if you haven't scanned them before.              Please read over the following fact sheets you were given: IF YOU HAVE QUESTIONS ABOUT YOUR PRE-OP INSTRUCTIONS PLEASE CALL [phone]     [facility] - Preparing for Surgery Before surgery, you can play an important role.  Because skin is not sterile, your skin needs to be as free of germs as possible.  You can reduce the number of germs on your skin by washing with CHG (chlorahexidine gluconate) soap before surgery.  CHG is an antiseptic cleaner which kills germs and bonds with the skin to continue killing germs even after washing. Please DO NOT use if you have an allergy to CHG or antibacterial soaps.  If your skin becomes reddened/irritated stop using the CHG and inform your nurse when you arrive at Short Stay. Do not shave (including legs and underarms) for at least 48 hours prior to the first CHG shower.   You may shave your face/neck. Please follow these instructions carefully:  1.  Shower with CHG Soap the night before surgery and the  morning of Surgery.  2.  If you choose to wash your hair, wash your hair first as usual with your  normal  shampoo.  3.  After you shampoo, rinse your hair and body thoroughly to remove the  shampoo.                           4.  Use CHG as you would any other liquid soap.  You can apply chg directly  to the skin and wash                       Gently with a scrungie or clean washcloth.  5.  Apply the CHG Soap to your body ONLY FROM THE NECK DOWN.   Do not use on face/ open                           Wound or open sores. Avoid contact with eyes, ears mouth and genitals (private parts).  Wash face,  Genitals (private parts) with your normal soap.             6.  Wash thoroughly, paying special attention to the area where your surgery  will be performed.  7.  Thoroughly rinse your body with warm water from the neck down.  8.  DO NOT shower/wash with your normal soap after using and rinsing off  the CHG Soap.                9.  Pat yourself dry with a clean towel.            10.  Wear clean pajamas.            11.  Place clean sheets on your bed the night of your first shower and do not  sleep with pets. Day of Surgery : Do not apply any lotions/deodorants the morning of surgery.  Please wear clean clothes to the hospital/surgery center.  FAILURE TO FOLLOW THESE INSTRUCTIONS MAY RESULT IN THE CANCELLATION OF YOUR SURGERY PATIENT SIGNATURE_________________________________  NURSE SIGNATURE__________________________________  ________________________________________________________________________   Adam Phenix  An incentive spirometer is a tool that can help keep your lungs clear and active. This tool measures how well you are filling your lungs with each breath. Taking long deep breaths may help reverse or decrease the chance of  developing breathing (pulmonary) problems (especially infection) following: A long period of time when you are unable to move or be active. BEFORE THE PROCEDURE  If the spirometer includes an indicator to show your best effort, your nurse or respiratory therapist will set it to a desired goal. If possible, sit up straight or lean slightly forward. Try not to slouch. Hold the incentive spirometer in an upright position. INSTRUCTIONS FOR USE  Sit on the edge of your bed if possible, or sit up as far as you can in bed or on a chair. Hold the incentive spirometer in an upright position. Breathe out normally. Place the mouthpiece in your mouth and seal your lips tightly around it. Breathe in slowly and as deeply as possible, raising the piston or the ball toward the top of the column. Hold your breath for 3-5 seconds or for as long as possible. Allow the piston or ball to fall to the bottom of the column. Remove the mouthpiece from your mouth and breathe out normally. Rest for a few seconds and repeat Steps 1 through 7 at least 10 times every 1-2 hours when you are awake. Take your time and take a few normal breaths between deep breaths. The spirometer may include an indicator to show your best effort. Use the indicator as a goal to work toward during each repetition. After each set of 10 deep breaths, practice coughing to be sure your lungs are clear. If you have an incision (the cut made at the time of surgery), support your incision when coughing by placing a pillow or rolled up towels firmly against it. Once you are able to get out of bed, walk around indoors and cough well. You may stop using the incentive spirometer when instructed by your caregiver.  RISKS AND COMPLICATIONS Take your time so you do not get dizzy or light-headed. If you are in pain, you may need to take or ask for pain medication before doing incentive spirometry. It is harder to take a deep breath if you are having pain. AFTER  USE Rest and breathe slowly and easily. It can be helpful to keep track  of a log of your progress. Your caregiver can provide you with a simple table to help with this. If you are using the spirometer at home, follow these instructions: [place] IF:  You are having difficultly using the spirometer. You have trouble using the spirometer as often as instructed. Your pain medication is not giving enough relief while using the spirometer. You develop fever of 100.5 F (38.1 C) or higher. SEEK IMMEDIATE MEDICAL CARE IF:  You cough up bloody sputum that had not been present before. You develop fever of 102 F (38.9 C) or greater. You develop worsening pain at or near the incision site. MAKE SURE YOU:  Understand these instructions. Will watch your condition. Will get help right away if you are not doing well or get worse. Document Released: 03/28/2007 Document Revised: 02/07/2012 Document Reviewed: 05/29/2007 [facility] Patient Information 2014 [place], [place].   ________________________________________________________________________

## 2021-10-07 NOTE — Telephone Encounter (Signed)
Pt will need a surgical clearance office visit please

## 2021-10-09 ENCOUNTER — Encounter (HOSPITAL_COMMUNITY)
Admission: RE | Admit: 2021-10-09 | Discharge: 2021-10-09 | Disposition: A | Discharge status: Home / Self Care | Payer: Medicare PPO | Source: Ambulatory Visit | Attending: Orthopedic Surgery | Admitting: Orthopedic Surgery

## 2021-10-09 ENCOUNTER — Other Ambulatory Visit: Payer: Self-pay

## 2021-10-09 ENCOUNTER — Encounter (HOSPITAL_COMMUNITY): Payer: Self-pay

## 2021-10-09 VITALS — BP 140/75 | HR 73 | Temp 98.2°F | Ht 67.5 in | Wt 168.0 lb

## 2021-10-09 DIAGNOSIS — Z01818 Encounter for other preprocedural examination: Secondary | ICD-10-CM | POA: Diagnosis not present

## 2021-10-09 HISTORY — DX: Malignant (primary) neoplasm, unspecified: C80.1

## 2021-10-09 LAB — PROTIME-INR
INR: 1 (ref 0.8–1.2)
Prothrombin Time: 13.2 seconds (ref 11.4–15.2)

## 2021-10-09 LAB — COMPREHENSIVE METABOLIC PANEL
ALT: 20 U/L (ref 0–44)
AST: 24 U/L (ref 15–41)
Albumin: 4.2 g/dL (ref 3.5–5.0)
Alkaline Phosphatase: 48 U/L (ref 38–126)
Anion gap: 5 (ref 5–15)
BUN: 9 mg/dL (ref 8–23)
CO2: 28 mmol/L (ref 22–32)
Calcium: 9.2 mg/dL (ref 8.9–10.3)
Chloride: 106 mmol/L (ref 98–111)
Creatinine, Ser: 0.61 mg/dL (ref 0.44–1.00)
GFR, Estimated: >60 mL/min (ref >60)
Glucose, Bld: 122 mg/dL — ABNORMAL HIGH (ref 70–99)
Potassium: 4.7 mmol/L (ref 3.5–5.1)
Sodium: 139 mmol/L (ref 135–145)
Total Bilirubin: 0.8 mg/dL (ref 0.3–1.2)
Total Protein: 7 g/dL (ref 6.5–8.1)

## 2021-10-09 LAB — CBC
HCT: 35 % — ABNORMAL LOW (ref 36.0–46.0)
Hemoglobin: 11.2 g/dL — ABNORMAL LOW (ref 12.0–15.0)
MCH: 30.2 pg (ref 26.0–34.0)
MCHC: 32 g/dL (ref 30.0–36.0)
MCV: 94.3 fL (ref 80.0–100.0)
Platelets: 217 10*3/uL (ref 150–400)
RBC: 3.71 MIL/uL — ABNORMAL LOW (ref 3.87–5.11)
RDW: 14.3 % (ref 11.5–15.5)
WBC: 4 10*3/uL (ref 4.0–10.5)
nRBC: 0 % (ref 0.0–0.2)

## 2021-10-09 LAB — GLUCOSE, CAPILLARY: Glucose-Capillary: 106 mg/dL — ABNORMAL HIGH (ref 70–99)

## 2021-10-09 LAB — SURGICAL PCR SCREEN
MRSA, PCR: NEGATIVE
Staphylococcus aureus: NEGATIVE

## 2021-10-09 NOTE — Progress Notes (Addendum)
COVID Vaccine Completed: Yes Date COVID Vaccine completed: 03/03/21 x 4 COVID vaccine manufacturer: [employer]    COVID Test: 10/15/21  PCP - DO: Jana Hakim. LOV: 07/24/21 Cardiologist -   Chest x-ray -  EKG -  Stress Test -  ECHO -  Cardiac Cath -  Pacemaker/ICD device last checked: A1C: 5.5: 08/17/21 Sleep Study -  CPAP -   Fasting Blood Sugar - N/A Checks Blood Sugar __0___ times a day  Blood Thinner Instructions: Aspirin Instructions: Last Dose:  Anesthesia review: Hx: HTN,DIA,Heart murmur  Patient denies shortness of breath, fever, cough and chest pain at PAT appointment   Patient verbalized understanding of instructions that were given to them at the PAT appointment. Patient was also instructed that they will need to review over the PAT instructions again at home before surgery.

## 2021-10-10 ENCOUNTER — Other Ambulatory Visit: Payer: Self-pay | Admitting: Family Medicine

## 2021-10-10 DIAGNOSIS — F418 Other specified anxiety disorders: Secondary | ICD-10-CM

## 2021-10-12 ENCOUNTER — Ambulatory Visit: Payer: Medicare PPO | Admitting: Family Medicine

## 2021-10-12 ENCOUNTER — Encounter: Payer: Self-pay | Admitting: Family Medicine

## 2021-10-12 ENCOUNTER — Other Ambulatory Visit: Payer: Self-pay

## 2021-10-12 VITALS — BP 134/70 | HR 85 | Temp 98.5°F | Ht 67.5 in | Wt 173.2 lb

## 2021-10-12 DIAGNOSIS — I10 Essential (primary) hypertension: Secondary | ICD-10-CM | POA: Diagnosis not present

## 2021-10-12 DIAGNOSIS — Z8639 Personal history of other endocrine, nutritional and metabolic disease: Secondary | ICD-10-CM | POA: Diagnosis not present

## 2021-10-12 DIAGNOSIS — Z01818 Encounter for other preprocedural examination: Secondary | ICD-10-CM

## 2021-10-12 DIAGNOSIS — E890 Postprocedural hypothyroidism: Secondary | ICD-10-CM | POA: Diagnosis not present

## 2021-10-12 DIAGNOSIS — Z23 Encounter for immunization: Secondary | ICD-10-CM | POA: Diagnosis not present

## 2021-10-12 DIAGNOSIS — F418 Other specified anxiety disorders: Secondary | ICD-10-CM

## 2021-10-12 NOTE — Assessment & Plan Note (Signed)
Well controlled, no changes to meds. Encouraged heart healthy diet such as the DASH diet and exercise as tolerated.  °

## 2021-10-12 NOTE — Assessment & Plan Note (Signed)
con't effexor stable

## 2021-10-12 NOTE — Assessment & Plan Note (Signed)
Lab Results  Component Value Date   HGBA1C 5.5 08/17/2021   controlled

## 2021-10-12 NOTE — Progress Notes (Signed)
Subjective:   By signing my name below, I, [name], attest that this documentation has been prepared under the direction and in the presence of [name], DO. 10/12/2021     Patient ID: Stacy Ayers, female    DOB: September 25, 1941, 80 y.o.   MRN: [id number]  Chief Complaint  Patient presents with   surgical clearance    Left knee replacement    HPI Patient is in today for office visit to get surgery clearance.  She has an upcoming total left knee arthroplasty on 10/19/2021 with Dr Wynelle Link.   She reports she has been feeling very fatigues recently and thinks it might be due to her thyroid levels.  She will receive her flu vaccine today. She has 4 Moderna vaccines at this time.  Past Medical History:  Diagnosis Date   Abnormal liver function tests    Allergy    Anxiety    Arthritis    knees, lower back    Asthma    Cancer ([place])    Cataract    left eye growing cataract    Depression    Diabetes mellitus    had gastric bypass DM resolved-    Diverticulosis    GERD (gastroesophageal reflux disease)    Headache    Previous migraines   Heart murmur    History of colon polyps    adenomatous   Hyperlipidemia    past hx- resloved after gastric bypass    Hypertension    Hypothyroidism    IBS (irritable bowel syndrome)    Iron deficiency anemia    Osteoporosis     Past Surgical History:  Procedure Laterality Date   ABDOMINAL HYSTERECTOMY     ABDOMINAL HYSTERECTOMY     APPENDECTOMY     BLADDER REPAIR     BREAST BIOPSY Right    needle core biopsy, benign   CATARACT EXTRACTION Left    CHOLECYSTECTOMY     COLONOSCOPY     CORONARY ANGIOPLASTY     GASTRIC BYPASS     POLYPECTOMY     REVERSE SHOULDER ARTHROPLASTY Right 07/15/2020   Procedure: REVERSE SHOULDER ARTHROPLASTY;  Surgeon: Justice Britain, MD;  Location: [facility];  Service: Orthopedics;  Laterality: Right;  159min   TONSILLECTOMY AND ADENOIDECTOMY      Family History  Problem Relation Age of Onset    Stroke Mother    Asthma Mother    Anuerysm Mother    Obesity Father    Diabetes Sister    Heart disease Maternal Grandmother    Angina Maternal Grandmother    Suicidality Maternal Grandfather    Cancer Paternal Grandmother    Colon cancer Neg Hx    Colon polyps Neg Hx    Esophageal cancer Neg Hx    Rectal cancer Neg Hx    Stomach cancer Neg Hx    Pancreatic cancer Neg Hx    Liver disease Neg Hx     Social History   Socioeconomic History   Marital status: Single    Spouse name: Not on file   Number of children: 1   Years of education: Not on file   Highest education level: Not on file  Occupational History   Occupation: retired Product manager: RETIRED  Tobacco Use   Smoking status: Former    Packs/day: 0.30    Years: 2.00    Pack years: 0.60    Types: Cigarettes    Quit date: 07/11/1962    Years  since quitting: 59.2   Smokeless tobacco: Never  Vaping Use   Vaping Use: Never used  Substance and Sexual Activity   Alcohol use: Yes   Drug use: No   Sexual activity: Yes    Birth control/protection: Surgical    Comment: Hysterectomy  Other Topics Concern   Not on file  Social History Narrative   Daily caffeine   Social Determinants of Health   Financial Resource Strain: Not on file  Food Insecurity: Not on file  Transportation Needs: Not on file  Physical Activity: Not on file  Stress: Not on file  Social Connections: Not on file  Intimate Partner Violence: Not on file    Outpatient Medications Prior to Visit  Medication Sig Dispense Refill   acetaminophen (TYLENOL) 650 MG CR tablet Take 650 mg by mouth every 8 (eight) hours as needed for pain.     albuterol (PROVENTIL HFA) 108 (90 Base) MCG/ACT inhaler Inhale 2 puffs into the lungs every 6 (six) hours as needed. 1 each 3   denosumab (PROLIA) 60 MG/ML SOSY injection Inject 60 mg into the skin every 6 (six) months. Deliver to [address], [address], [place] [address] 1 mL 1   fluticasone  (FLONASE) 50 MCG/ACT nasal spray Place 2 sprays into both nostrils daily. (Patient taking differently: Place 2 sprays into both nostrils daily as needed for allergies.) 16 g 5   levothyroxine (SYNTHROID) 150 MCG tablet Take 1 tablet (150 mcg total) by mouth daily before breakfast. (Patient taking differently: Take 150 mcg by mouth every Monday, Wednesday, and Friday.) 90 tablet 3   loperamide (IMODIUM) 2 MG capsule Take 2 mg by mouth as needed for diarrhea or loose stools.      loratadine (CLARITIN) 10 MG tablet TAKE 1 TABLET ONCE DAILY. (Patient taking differently: Take 10 mg by mouth daily as needed for allergies.) 30 tablet 4   losartan (COZAAR) 50 MG tablet Take 1 tablet (50 mg total) by mouth daily. 90 tablet 1   Melatonin 10 MG TABS Take 10 mg by mouth at bedtime.     metoprolol tartrate (LOPRESSOR) 25 MG tablet Take 25 mg by mouth daily.     pantoprazole (PROTONIX) 40 MG tablet Take 1 tablet (40 mg total) by mouth daily. 90 tablet 03   SYNTHROID 137 MCG tablet TAKE 1 TABLET ONCE DAILY ON MONDAY, WEDNESDAY, AND FRIDAY. TAKE 150MCG DAILY ALL OTHER DAYS. (Patient taking differently: Take 137 mcg by mouth every Tuesday, Thursday, Saturday, and Sunday. Marland Kitchen) 90 tablet 1   venlafaxine XR (EFFEXOR-XR) 75 MG 24 hr capsule TAKE 1 CAPSULE ONCE DAILY WITH BREAKFAST. 90 capsule 1   vitamin B-12 (CYANOCOBALAMIN) 1000 MCG tablet Take 1,000 mcg by mouth daily.     cyclobenzaprine (FLEXERIL) 10 MG tablet Take 1 tablet (10 mg total) by mouth 3 (three) times daily as needed for muscle spasms. (Patient not taking: No sig reported) 30 tablet 1   No facility-administered medications prior to visit.    Allergies  Allergen Reactions   Sulfonamide Derivatives Diarrhea   Cocaine     Other reaction(s): Hallucination Sinus irrigation   Montelukast Sodium     UNKNOWN   Penicillins     Local skin reaction.  Tolerated ancef 07/15/2020    Pioglitazone     UNKNOWN    Rosiglitazone Maleate     UNKNOWN    Secobarbital Sodium     UNKNOWN   Codeine Rash   Doxycycline Nausea Only    REACTION: GI  UPSET    Review of Systems  Constitutional:  Negative for fever.  HENT:  Negative for congestion, ear pain, hearing loss, sinus pain and sore throat.   Eyes:  Negative for blurred vision and pain.  Respiratory:  Negative for cough, sputum production, shortness of breath and wheezing.   Cardiovascular:  Negative for chest pain and palpitations.  Gastrointestinal:  Negative for blood in stool, constipation, diarrhea, nausea and vomiting.  Genitourinary:  Negative for dysuria, frequency, hematuria and urgency.  Musculoskeletal:  Negative for back pain, falls and myalgias.  Neurological:  Negative for dizziness, sensory change, loss of consciousness, weakness and headaches.  Endo/Heme/Allergies:  Negative for environmental allergies. Does not bruise/bleed easily.  Psychiatric/Behavioral:  Negative for depression and suicidal ideas. The patient is not nervous/anxious and does not have insomnia.       Objective:    Physical Exam Constitutional:      General: She is not in acute distress.    Appearance: Normal appearance. She is not ill-appearing.  HENT:     Head: Normocephalic and atraumatic.     Right Ear: External ear normal.     Left Ear: External ear normal.  Eyes:     Extraocular Movements: Extraocular movements intact.     Pupils: Pupils are equal, round, and reactive to light.  Cardiovascular:     Rate and Rhythm: Normal rate and regular rhythm.     Pulses: Normal pulses.     Heart sounds: Normal heart sounds. No murmur heard.   No gallop.  Pulmonary:     Effort: Pulmonary effort is normal. No respiratory distress.     Breath sounds: Normal breath sounds. No wheezing, rhonchi or rales.  Abdominal:     General: Bowel sounds are normal. There is no distension.     Palpations: Abdomen is soft. There is no mass.     Tenderness: There is no abdominal tenderness. There is no guarding or  rebound.     Hernia: No hernia is present.  Musculoskeletal:     Cervical back: Normal range of motion and neck supple.  Lymphadenopathy:     Cervical: No cervical adenopathy.  Skin:    General: Skin is warm and dry.  Neurological:     Mental Status: She is alert and oriented to person, place, and time.  Psychiatric:        Behavior: Behavior normal.    BP 134/70 (BP Location: Left Arm, Patient Position: Sitting, Cuff Size: Normal)   Pulse 85   Temp 98.5 F (36.9 C) (Oral)   Ht 5' 7.5" (1.715 m)   Wt 173 lb 3.2 oz (78.6 kg)   HC 18" (45.7 cm)   SpO2 100%   BMI 26.73 kg/m  Wt Readings from Last 3 Encounters:  10/12/21 173 lb 3.2 oz (78.6 kg)  10/09/21 168 lb (76.2 kg)  07/24/21 173 lb 6.4 oz (78.7 kg)    Diabetic Foot Exam - Simple   No data filed    Lab Results  Component Value Date   WBC 4.0 10/09/2021   HGB 11.2 (L) 10/09/2021   HCT 35.0 (L) 10/09/2021   PLT 217 10/09/2021   GLUCOSE 122 (H) 10/09/2021   CHOL 169 08/17/2021   TRIG 127 08/17/2021   HDL 53 08/17/2021   LDLDIRECT 147.0 05/28/2019   LDLCALC 94 08/17/2021   ALT 20 10/09/2021   AST 24 10/09/2021   NA 139 10/09/2021   K 4.7 10/09/2021   CL 106 10/09/2021   CREATININE 0.61  10/09/2021   BUN 9 10/09/2021   CO2 28 10/09/2021   TSH 0.38 (A) 08/17/2021   INR 1.0 10/09/2021   HGBA1C 5.5 08/17/2021    Lab Results  Component Value Date   TSH 0.38 (A) 08/17/2021   Lab Results  Component Value Date   WBC 4.0 10/09/2021   HGB 11.2 (L) 10/09/2021   HCT 35.0 (L) 10/09/2021   MCV 94.3 10/09/2021   PLT 217 10/09/2021   Lab Results  Component Value Date   NA 139 10/09/2021   K 4.7 10/09/2021   CO2 28 10/09/2021   GLUCOSE 122 (H) 10/09/2021   BUN 9 10/09/2021   CREATININE 0.61 10/09/2021   BILITOT 0.8 10/09/2021   ALKPHOS 48 10/09/2021   AST 24 10/09/2021   ALT 20 10/09/2021   PROT 7.0 10/09/2021   ALBUMIN 4.2 10/09/2021   CALCIUM 9.2 10/09/2021   ANIONGAP 5 10/09/2021   GFR 84.31  04/28/2021   Lab Results  Component Value Date   CHOL 169 08/17/2021   Lab Results  Component Value Date   HDL 53 08/17/2021   Lab Results  Component Value Date   LDLCALC 94 08/17/2021   Lab Results  Component Value Date   TRIG 127 08/17/2021   Lab Results  Component Value Date   CHOLHDL 4 11/06/2020   Lab Results  Component Value Date   HGBA1C 5.5 08/17/2021       Assessment & Plan:   Problem List Items Addressed This Visit   None Visit Diagnoses     Need for influenza vaccination    -  Primary   Relevant Orders   Flu Vaccine QUAD High Dose(Fluad)       No orders of the defined types were placed in this encounter.   I,[name],acting as a Education administrator for [employer], DO.,have documented all relevant documentation on the behalf of [name], DO,as directed by  [name], DO while in the presence of [name], [place], DO., personally preformed the services described in this documentation.  All medical record entries made by the scribe were at my direction and in my presence.  I have reviewed the chart and discharge instructions (if applicable) and agree that the record reflects my personal performance and is accurate and complete. 10/12/2021

## 2021-10-12 NOTE — Assessment & Plan Note (Addendum)
Pt clear for knee replacement  Low cardiac risk  EKG done 11/10   NSR

## 2021-10-12 NOTE — Patient Instructions (Signed)
Preparing for Knee Replacement Getting prepared before knee replacement surgery can make recovery easier and more comfortable. This document provides some tips and guidelines that will help you prepare for your surgery. Talk with your health care provider so you can learn what to expect before, during, and after surgery. Ask questions if you do not understand something. Tell a health care provider about: Any allergies you have. All medicines you are taking, including vitamins, herbs, eye drops, creams, and over-the-counter medicines. Any problems you or family members have had with anesthetic medicines. Any blood disorders you have. Any surgeries you have had. Any medical conditions you have. Whether you are pregnant or may be pregnant. What happens before the procedure? Visit your health care providers Keep all appointments before surgery. You will need to have a physical exam before surgery (preoperative exam) to make sure it is safe for you to have knee replacement surgery. You may also need to have more tests. When you go to the exam, bring a list of all the medicines and supplements, including herbs and vitamins, that you take. Have dental care and routine cleanings done before surgery. Germs from anywhere in your body, including your mouth, can travel to your new joint and infect it. Tell your dentist that you plan to have knee replacement surgery. Know the costs of surgery To find out how much the surgery will cost, call your insurance company as soon as you decide to have surgery. Ask questions like: How much of the surgery and hospital stay will be covered? What will be covered for: Medical equipment? Rehabilitation facilities? Home care? Prepare your home Pick a recovery spot that is not your bed. It is better that you sit more upright during recovery. You may want to use a recliner. Place items that you often use on a small table near your recovery spot. These may include the TV  remote, a cordless phone or your mobile phone, a book, a laptop computer, and a water glass. Move other items you will need to shelves and drawers that are at countertop height. Do this in your kitchen, bathroom, and bedroom. You may be given a walker to use at home. Check if you will have enough room to use a walker. Move around your home with your hands out about 6 inches (15 cm) from your sides. You will have enough room if you do not hit anything with your hands as you do this. Walk from: Your recovery spot to your kitchen and bathroom. Your bed to the bathroom. Prepare some meals to freeze and reheat later. Make your home safe for recovery   Remove all clutter and throw rugs from your floors. This will help you avoid tripping. Consider getting safety equipment that will be helpful during your recovery, such as: Grab bars in the shower and near the toilet. A raised toilet seat. This will help you get on and off the toilet more easily. A tub or shower bench. Prepare your body If you smoke, quit as soon as you can before surgery. If there is time, it is best to quit several months before surgery. Tell your surgeon if you use any products that contain nicotine or tobacco. These products include cigarettes, chewing tobacco, and vaping devices, such as e-cigarettes. These can delay healing. If you need help quitting, ask your health care provider. Talk to your health care provider about doing exercises before your surgery. Doing these exercises in the weeks before your surgery may help reduce pain and  improve function after surgery. Be sure to follow the exercise program given by your health care provider. Maintain a healthy diet. Do not change your diet before surgery unless your health care provider tells you to do that. Do not drink any alcohol for at least 48 hours before surgery. Plan your recovery In the first couple of weeks after surgery, it will be harder for you to do some of your  regular activities. You may get tired easily, and you will have limited movement in your leg. To make sure you have all the help you need after your surgery: Plan to have a responsible adult take you home from the hospital. Your health care provider will tell you how many days you can expect to be in the hospital. Cancel all work, caregiving, and volunteer responsibilities for at least 4-6 weeks after surgery. Plan to have a responsible adult stay with you day and night for the first week. This person should be someone you are comfortable with. You may need this person to help you with your exercises and personal care, such as bathing and using the toilet. If you live alone, arrange for someone to take care of your home and pets for the first 4-6 weeks after surgery. Arrange for drivers to take you to follow-up visits, the grocery store, and other places you may need to go for at least 4-6 weeks. Consider applying for a disability parking permit. To get an application, call your local department of motor vehicles [facility]) or your health care provider's office. Summary Getting prepared before knee replacement surgery can make your recovery easier and more comfortable. Keep all visits to your health care provider before surgery. You will have an exam and may have tests to make sure that you are ready for your surgery. Prepare your home and arrange for help at home. Plan to have a responsible adult take you home from the hospital. Also, plan to have a responsible adult stay with you day and night for the first week after you leave the hospital. This information is not intended to replace advice given to you by your health care provider. Make sure you discuss any questions you have with your health care provider. Document Revised: 05/06/2020 Document Reviewed: 05/06/2020 Elsevier Patient Education  [place].

## 2021-10-12 NOTE — Assessment & Plan Note (Signed)
Pt has been tired ---  Lab Results  Component Value Date   TSH 0.38 (A) 08/17/2021  pt wants to hold off on changing meds and recheck labs after surgery

## 2021-10-16 ENCOUNTER — Other Ambulatory Visit: Payer: Self-pay | Admitting: Orthopedic Surgery

## 2021-10-16 LAB — SARS CORONAVIRUS 2 (TAT 6-24 HRS): SARS Coronavirus 2: NEGATIVE

## 2021-10-19 ENCOUNTER — Other Ambulatory Visit: Payer: Self-pay

## 2021-10-19 ENCOUNTER — Ambulatory Visit (HOSPITAL_COMMUNITY): Payer: Medicare PPO | Admitting: Anesthesiology

## 2021-10-19 ENCOUNTER — Encounter (HOSPITAL_COMMUNITY): Payer: Self-pay | Admitting: Orthopedic Surgery

## 2021-10-19 ENCOUNTER — Encounter (HOSPITAL_COMMUNITY)
Admission: RE | Disposition: A | Discharge status: Home / Self Care | Payer: Self-pay | Source: Home / Self Care | Attending: Orthopedic Surgery

## 2021-10-19 ENCOUNTER — Observation Stay (HOSPITAL_COMMUNITY)
Admission: RE | Admit: 2021-10-19 | Discharge: 2021-10-20 | Disposition: A | Discharge status: Home / Self Care | Payer: Medicare PPO | Attending: Orthopedic Surgery | Admitting: Orthopedic Surgery

## 2021-10-19 ENCOUNTER — Ambulatory Visit (HOSPITAL_COMMUNITY): Payer: Medicare PPO | Admitting: Physician Assistant

## 2021-10-19 DIAGNOSIS — Z20822 Contact with and (suspected) exposure to covid-19: Secondary | ICD-10-CM | POA: Diagnosis not present

## 2021-10-19 DIAGNOSIS — M179 Osteoarthritis of knee, unspecified: Secondary | ICD-10-CM

## 2021-10-19 DIAGNOSIS — J45909 Unspecified asthma, uncomplicated: Secondary | ICD-10-CM | POA: Diagnosis not present

## 2021-10-19 DIAGNOSIS — E119 Type 2 diabetes mellitus without complications: Secondary | ICD-10-CM | POA: Diagnosis not present

## 2021-10-19 DIAGNOSIS — Z96611 Presence of right artificial shoulder joint: Secondary | ICD-10-CM | POA: Diagnosis not present

## 2021-10-19 DIAGNOSIS — E039 Hypothyroidism, unspecified: Secondary | ICD-10-CM | POA: Insufficient documentation

## 2021-10-19 DIAGNOSIS — G8918 Other acute postprocedural pain: Secondary | ICD-10-CM | POA: Diagnosis not present

## 2021-10-19 DIAGNOSIS — M1712 Unilateral primary osteoarthritis, left knee: Principal | ICD-10-CM | POA: Diagnosis present

## 2021-10-19 DIAGNOSIS — D509 Iron deficiency anemia, unspecified: Secondary | ICD-10-CM | POA: Diagnosis not present

## 2021-10-19 DIAGNOSIS — Z87891 Personal history of nicotine dependence: Secondary | ICD-10-CM | POA: Diagnosis not present

## 2021-10-19 DIAGNOSIS — I10 Essential (primary) hypertension: Secondary | ICD-10-CM | POA: Insufficient documentation

## 2021-10-19 DIAGNOSIS — Z79899 Other long term (current) drug therapy: Secondary | ICD-10-CM | POA: Diagnosis not present

## 2021-10-19 DIAGNOSIS — E559 Vitamin D deficiency, unspecified: Secondary | ICD-10-CM | POA: Diagnosis not present

## 2021-10-19 HISTORY — PX: TOTAL KNEE ARTHROPLASTY: SHX125

## 2021-10-19 LAB — GLUCOSE, CAPILLARY
Glucose-Capillary: 120 mg/dL — ABNORMAL HIGH (ref 70–99)
Glucose-Capillary: 107 mg/dL — ABNORMAL HIGH (ref 70–99)

## 2021-10-19 LAB — ABO/RH: ABO/RH(D): O POS

## 2021-10-19 LAB — TYPE AND SCREEN
ABO/RH(D): O POS
Antibody Screen: NEGATIVE

## 2021-10-19 SURGERY — ARTHROPLASTY, KNEE, TOTAL
Anesthesia: Monitor Anesthesia Care | Site: Knee | Laterality: Left

## 2021-10-19 MED ORDER — LOSARTAN POTASSIUM 50 MG PO TABS
50.0000 mg | ORAL_TABLET | Freq: Every day | ORAL | Status: DC
  Administered 2021-10-20: 50 mg via ORAL
  Filled 2021-10-19: qty 1

## 2021-10-19 MED ORDER — BISACODYL 10 MG RE SUPP: 10.0000 mg | Freq: Every day | RECTAL | Status: DC | PRN

## 2021-10-19 MED ORDER — LACTATED RINGERS IV SOLN: INTRAVENOUS | Status: DC

## 2021-10-19 MED ORDER — FENTANYL CITRATE PF 50 MCG/ML IJ SOSY
50.0000 ug | PREFILLED_SYRINGE | INTRAMUSCULAR | Status: AC
  Administered 2021-10-19: 50 ug via INTRAVENOUS
  Filled 2021-10-19: qty 2

## 2021-10-19 MED ORDER — HYDROMORPHONE HCL 2 MG PO TABS
2.0000 mg | ORAL_TABLET | ORAL | Status: DC | PRN
Start: 2021-10-19 — End: 2021-10-20
  Administered 2021-10-19: 4 mg via ORAL
  Administered 2021-10-19: 2 mg via ORAL
  Administered 2021-10-20 (×4): 4 mg via ORAL
  Filled 2021-10-19 (×2): qty 2
  Filled 2021-10-19: qty 1
  Filled 2021-10-19 (×3): qty 2

## 2021-10-19 MED ORDER — 0.9 % SODIUM CHLORIDE (POUR BTL) OPTIME
TOPICAL | Status: DC | PRN
  Administered 2021-10-19: 1000 mL

## 2021-10-19 MED ORDER — POVIDONE-IODINE 10 % EX SWAB
2.0000 "application " | Freq: Once | CUTANEOUS | Status: AC
  Administered 2021-10-19: 2 via TOPICAL

## 2021-10-19 MED ORDER — SODIUM CHLORIDE (PF) 0.9 % IJ SOLN
INTRAMUSCULAR | Status: DC | PRN
  Administered 2021-10-19: 60 mL

## 2021-10-19 MED ORDER — BUPIVACAINE LIPOSOME 1.3 % IJ SUSP
INTRAMUSCULAR | Status: AC
  Filled 2021-10-19: qty 20

## 2021-10-19 MED ORDER — ALBUTEROL SULFATE (2.5 MG/3ML) 0.083% IN NEBU: 3.0000 mL | INHALATION_SOLUTION | Freq: Four times a day (QID) | RESPIRATORY_TRACT | Status: DC | PRN

## 2021-10-19 MED ORDER — DEXAMETHASONE SODIUM PHOSPHATE 10 MG/ML IJ SOLN
10.0000 mg | Freq: Once | INTRAMUSCULAR | Status: AC
  Administered 2021-10-20: 10 mg via INTRAVENOUS
  Filled 2021-10-19: qty 1

## 2021-10-19 MED ORDER — METHOCARBAMOL 500 MG PO TABS
500.0000 mg | ORAL_TABLET | Freq: Four times a day (QID) | ORAL | Status: DC | PRN
  Administered 2021-10-19 – 2021-10-20 (×5): 500 mg via ORAL
  Filled 2021-10-19 (×5): qty 1

## 2021-10-19 MED ORDER — DOCUSATE SODIUM 100 MG PO CAPS
100.0000 mg | ORAL_CAPSULE | Freq: Two times a day (BID) | ORAL | Status: DC
  Administered 2021-10-19 – 2021-10-20 (×2): 100 mg via ORAL
  Filled 2021-10-19 (×2): qty 1

## 2021-10-19 MED ORDER — PROPOFOL 10 MG/ML IV BOLUS
INTRAVENOUS | Status: AC
  Filled 2021-10-19: qty 20

## 2021-10-19 MED ORDER — POLYETHYLENE GLYCOL 3350 17 G PO PACK: 17.0000 g | PACK | Freq: Every day | ORAL | Status: DC | PRN

## 2021-10-19 MED ORDER — CHLORHEXIDINE GLUCONATE 0.12 % MT SOLN
15.0000 mL | Freq: Once | OROMUCOSAL | Status: AC
  Administered 2021-10-19: 15 mL via OROMUCOSAL

## 2021-10-19 MED ORDER — DIPHENHYDRAMINE HCL 12.5 MG/5ML PO ELIX: 12.5000 mg | ORAL_SOLUTION | ORAL | Status: DC | PRN

## 2021-10-19 MED ORDER — FENTANYL CITRATE PF 50 MCG/ML IJ SOSY: 25.0000 ug | PREFILLED_SYRINGE | INTRAMUSCULAR | Status: DC | PRN

## 2021-10-19 MED ORDER — METHOCARBAMOL 1000 MG/10ML IJ SOLN
500.0000 mg | Freq: Four times a day (QID) | INTRAVENOUS | Status: DC | PRN
  Filled 2021-10-19: qty 5

## 2021-10-19 MED ORDER — LEVOTHYROXINE SODIUM 75 MCG PO TABS: 150.0000 ug | ORAL_TABLET | ORAL | Status: DC

## 2021-10-19 MED ORDER — PANTOPRAZOLE SODIUM 40 MG PO TBEC
40.0000 mg | DELAYED_RELEASE_TABLET | Freq: Every day | ORAL | Status: DC
  Administered 2021-10-20: 40 mg via ORAL
  Filled 2021-10-19: qty 1

## 2021-10-19 MED ORDER — METOCLOPRAMIDE HCL 5 MG PO TABS: 5.0000 mg | ORAL_TABLET | Freq: Three times a day (TID) | ORAL | Status: DC | PRN

## 2021-10-19 MED ORDER — FLUTICASONE PROPIONATE 50 MCG/ACT NA SUSP
2.0000 | Freq: Every day | NASAL | Status: DC | PRN
  Filled 2021-10-19: qty 16

## 2021-10-19 MED ORDER — BUPIVACAINE LIPOSOME 1.3 % IJ SUSP
INTRAMUSCULAR | Status: DC | PRN
  Administered 2021-10-19: 20 mL

## 2021-10-19 MED ORDER — DEXAMETHASONE SODIUM PHOSPHATE 10 MG/ML IJ SOLN
8.0000 mg | Freq: Once | INTRAMUSCULAR | Status: AC
  Administered 2021-10-19: 4 mg via INTRAVENOUS

## 2021-10-19 MED ORDER — ORAL CARE MOUTH RINSE: 15.0000 mL | Freq: Once | OROMUCOSAL | Status: AC

## 2021-10-19 MED ORDER — ONDANSETRON HCL 4 MG/2ML IJ SOLN
4.0000 mg | Freq: Four times a day (QID) | INTRAMUSCULAR | Status: DC | PRN
  Administered 2021-10-19: 4 mg via INTRAVENOUS
  Filled 2021-10-19: qty 2

## 2021-10-19 MED ORDER — LORATADINE 10 MG PO TABS: 10.0000 mg | ORAL_TABLET | Freq: Every day | ORAL | Status: DC | PRN

## 2021-10-19 MED ORDER — PROPOFOL 500 MG/50ML IV EMUL
INTRAVENOUS | Status: DC | PRN
  Administered 2021-10-19: 100 ug/kg/min via INTRAVENOUS

## 2021-10-19 MED ORDER — BUPIVACAINE IN DEXTROSE 0.75-8.25 % IT SOLN
INTRATHECAL | Status: DC | PRN
  Administered 2021-10-19: 1.4 mL via INTRATHECAL

## 2021-10-19 MED ORDER — SODIUM CHLORIDE 0.9 % IV SOLN: INTRAVENOUS | Status: DC

## 2021-10-19 MED ORDER — RIVAROXABAN 10 MG PO TABS
10.0000 mg | ORAL_TABLET | Freq: Every day | ORAL | Status: DC
  Administered 2021-10-20: 10 mg via ORAL
  Filled 2021-10-19: qty 1

## 2021-10-19 MED ORDER — LIDOCAINE HCL (CARDIAC) PF 100 MG/5ML IV SOSY
PREFILLED_SYRINGE | INTRAVENOUS | Status: DC | PRN
  Administered 2021-10-19: 60 mg via INTRAVENOUS

## 2021-10-19 MED ORDER — ACETAMINOPHEN 10 MG/ML IV SOLN
1000.0000 mg | Freq: Four times a day (QID) | INTRAVENOUS | Status: DC
  Administered 2021-10-19: 1000 mg via INTRAVENOUS
  Filled 2021-10-19: qty 100

## 2021-10-19 MED ORDER — VENLAFAXINE HCL ER 75 MG PO CP24
75.0000 mg | ORAL_CAPSULE | Freq: Every day | ORAL | Status: DC
  Administered 2021-10-20: 75 mg via ORAL
  Filled 2021-10-19: qty 1

## 2021-10-19 MED ORDER — ROPIVACAINE HCL 5 MG/ML IJ SOLN
INTRAMUSCULAR | Status: DC | PRN
  Administered 2021-10-19: 20 mL via PERINEURAL

## 2021-10-19 MED ORDER — ONDANSETRON HCL 4 MG PO TABS: 4.0000 mg | ORAL_TABLET | Freq: Four times a day (QID) | ORAL | Status: DC | PRN

## 2021-10-19 MED ORDER — METOCLOPRAMIDE HCL 5 MG/ML IJ SOLN: 5.0000 mg | Freq: Three times a day (TID) | INTRAMUSCULAR | Status: DC | PRN

## 2021-10-19 MED ORDER — MORPHINE SULFATE (PF) 2 MG/ML IV SOLN
0.5000 mg | INTRAVENOUS | Status: DC | PRN
Start: 2021-10-19 — End: 2021-10-20
  Administered 2021-10-19 (×2): 1 mg via INTRAVENOUS
  Filled 2021-10-19 (×2): qty 1

## 2021-10-19 MED ORDER — LEVOTHYROXINE SODIUM 25 MCG PO TABS
137.0000 ug | ORAL_TABLET | ORAL | Status: DC
  Administered 2021-10-20: 137 ug via ORAL
  Filled 2021-10-19: qty 1

## 2021-10-19 MED ORDER — MENTHOL 3 MG MT LOZG: 1.0000 | LOZENGE | OROMUCOSAL | Status: DC | PRN

## 2021-10-19 MED ORDER — FLEET ENEMA 7-19 GM/118ML RE ENEM: 1.0000 | ENEMA | Freq: Once | RECTAL | Status: DC | PRN

## 2021-10-19 MED ORDER — TRANEXAMIC ACID-NACL 1000-0.7 MG/100ML-% IV SOLN
1000.0000 mg | INTRAVENOUS | Status: AC
  Administered 2021-10-19: 1000 mg via INTRAVENOUS
  Filled 2021-10-19: qty 100

## 2021-10-19 MED ORDER — CEFAZOLIN SODIUM-DEXTROSE 2-4 GM/100ML-% IV SOLN
2.0000 g | INTRAVENOUS | Status: AC
  Administered 2021-10-19: 2 g via INTRAVENOUS
  Filled 2021-10-19: qty 100

## 2021-10-19 MED ORDER — BUPIVACAINE LIPOSOME 1.3 % IJ SUSP: 20.0000 mL | Freq: Once | INTRAMUSCULAR | Status: DC

## 2021-10-19 MED ORDER — PHENYLEPHRINE 40 MCG/ML (10ML) SYRINGE FOR IV PUSH (FOR BLOOD PRESSURE SUPPORT)
PREFILLED_SYRINGE | INTRAVENOUS | Status: DC | PRN
  Administered 2021-10-19 (×2): 80 ug via INTRAVENOUS

## 2021-10-19 MED ORDER — CEFAZOLIN SODIUM-DEXTROSE 2-4 GM/100ML-% IV SOLN
2.0000 g | Freq: Four times a day (QID) | INTRAVENOUS | Status: AC
  Administered 2021-10-19 (×2): 2 g via INTRAVENOUS
  Filled 2021-10-19 (×2): qty 100

## 2021-10-19 MED ORDER — SODIUM CHLORIDE (PF) 0.9 % IJ SOLN
INTRAMUSCULAR | Status: AC
  Filled 2021-10-19: qty 10

## 2021-10-19 MED ORDER — SODIUM CHLORIDE 0.9 % IR SOLN
Status: DC | PRN
  Administered 2021-10-19: 1000 mL

## 2021-10-19 MED ORDER — PHENOL 1.4 % MT LIQD: 1.0000 | OROMUCOSAL | Status: DC | PRN

## 2021-10-19 MED ORDER — AMISULPRIDE (ANTIEMETIC) 5 MG/2ML IV SOLN: 10.0000 mg | Freq: Once | INTRAVENOUS | Status: DC | PRN

## 2021-10-19 MED ORDER — ACETAMINOPHEN 500 MG PO TABS
1000.0000 mg | ORAL_TABLET | Freq: Four times a day (QID) | ORAL | Status: AC
  Administered 2021-10-19 – 2021-10-20 (×4): 1000 mg via ORAL
  Filled 2021-10-19 (×4): qty 2

## 2021-10-19 SURGICAL SUPPLY — 56 items
ATTUNE MED DOME PAT 38 KNEE (Knees) ×1 IMPLANT
ATTUNE PS FEM LT SZ 5 CEM KNEE (Femur) ×1 IMPLANT
ATTUNE PSRP INSR SZ 5 10M KNEE (Insert) ×1 IMPLANT
BAG COUNTER SPONGE SURGICOUNT (BAG) IMPLANT
BAG SPEC THK2 15X12 ZIP CLS (MISCELLANEOUS) ×1
BAG SPNG CNTER NS LX DISP (BAG)
BAG ZIPLOCK 12X15 (MISCELLANEOUS) ×2 IMPLANT
BASE TIBIAL ROT PLAT SZ 5 KNEE (Knees) IMPLANT
BLADE SAG 18X100X1.27 (BLADE) ×2 IMPLANT
BLADE SAW SGTL 11.0X1.19X90.0M (BLADE) ×2 IMPLANT
BNDG CMPR MED 10X6 ELC LF (GAUZE/BANDAGES/DRESSINGS) ×1
BNDG ELASTIC 6X10 VLCR STRL LF (GAUZE/BANDAGES/DRESSINGS) ×1 IMPLANT
BNDG ELASTIC 6X5.8 VLCR STR LF (GAUZE/BANDAGES/DRESSINGS) ×2 IMPLANT
BOWL SMART MIX CTS (DISPOSABLE) ×2 IMPLANT
BSPLAT TIB 5 CMNT ROT PLAT STR (Knees) ×1 IMPLANT
CEMENT HV SMART SET (Cement) ×4 IMPLANT
COVER SURGICAL LIGHT HANDLE (MISCELLANEOUS) ×2 IMPLANT
CUFF TOURN SGL QUICK 34 (TOURNIQUET CUFF) ×2
CUFF TRNQT CYL 34X4.125X (TOURNIQUET CUFF) ×1 IMPLANT
DECANTER SPIKE VIAL GLASS SM (MISCELLANEOUS) ×2 IMPLANT
DRAPE INCISE IOBAN 66X45 STRL (DRAPES) ×2 IMPLANT
DRAPE U-SHAPE 47X51 STRL (DRAPES) ×2 IMPLANT
DRSG AQUACEL AG ADV 3.5X10 (GAUZE/BANDAGES/DRESSINGS) ×2 IMPLANT
DURAPREP 26ML APPLICATOR (WOUND CARE) ×2 IMPLANT
ELECT REM PT RETURN 15FT ADLT (MISCELLANEOUS) ×2 IMPLANT
GLOVE SRG 8 PF TXTR STRL LF DI (GLOVE) ×1 IMPLANT
GLOVE SURG ENC MOIS LTX SZ6.5 (GLOVE) ×2 IMPLANT
GLOVE SURG ENC MOIS LTX SZ8 (GLOVE) ×4 IMPLANT
GLOVE SURG UNDER POLY LF SZ7 (GLOVE) ×2 IMPLANT
GLOVE SURG UNDER POLY LF SZ8 (GLOVE) ×2
GLOVE SURG UNDER POLY LF SZ8.5 (GLOVE) ×2 IMPLANT
GOWN STRL REUS W/TWL LRG LVL3 (GOWN DISPOSABLE) ×4 IMPLANT
GOWN STRL REUS W/TWL XL LVL3 (GOWN DISPOSABLE) ×2 IMPLANT
HANDPIECE INTERPULSE COAX TIP (DISPOSABLE) ×2
HOLDER FOLEY CATH W/STRAP (MISCELLANEOUS) IMPLANT
IMMOBILIZER KNEE 20 (SOFTGOODS) ×2
IMMOBILIZER KNEE 20 THIGH 36 (SOFTGOODS) ×1 IMPLANT
KIT TURNOVER KIT A (KITS) IMPLANT
MANIFOLD NEPTUNE II (INSTRUMENTS) ×2 IMPLANT
NS IRRIG 1000ML POUR BTL (IV SOLUTION) ×2 IMPLANT
PACK TOTAL KNEE CUSTOM (KITS) ×2 IMPLANT
PADDING CAST COTTON 6X4 STRL (CAST SUPPLIES) ×3 IMPLANT
PROTECTOR NERVE ULNAR (MISCELLANEOUS) ×2 IMPLANT
SET HNDPC FAN SPRY TIP SCT (DISPOSABLE) ×1 IMPLANT
STRIP CLOSURE SKIN 1/2X4 (GAUZE/BANDAGES/DRESSINGS) ×4 IMPLANT
SUT MNCRL AB 4-0 PS2 18 (SUTURE) ×2 IMPLANT
SUT STRATAFIX 0 PDS 27 VIOLET (SUTURE) ×2
SUT VIC AB 2-0 CT1 27 (SUTURE) ×6
SUT VIC AB 2-0 CT1 TAPERPNT 27 (SUTURE) ×3 IMPLANT
SUTURE STRATFX 0 PDS 27 VIOLET (SUTURE) ×1 IMPLANT
TAPE STRIPS DRAPE STRL (GAUZE/BANDAGES/DRESSINGS) ×1 IMPLANT
TIBIAL BASE ROT PLAT SZ 5 KNEE (Knees) ×2 IMPLANT
TRAY FOLEY MTR SLVR 16FR STAT (SET/KITS/TRAYS/PACK) ×2 IMPLANT
TUBE SUCTION HIGH CAP CLEAR NV (SUCTIONS) ×2 IMPLANT
WATER STERILE IRR 1000ML POUR (IV SOLUTION) ×4 IMPLANT
WRAP KNEE MAXI GEL POST OP (GAUZE/BANDAGES/DRESSINGS) ×2 IMPLANT

## 2021-10-19 NOTE — Anesthesia Procedure Notes (Signed)
Procedure Name: MAC Date/Time: 10/19/2021 8:27 AM Performed by: Lieutenant Diego, CRNA Pre-anesthesia Checklist: Patient identified, Emergency Drugs available, Suction available, Patient being monitored and Timeout performed Patient Re-evaluated:Patient Re-evaluated prior to induction Oxygen Delivery Method: Simple face mask Preoxygenation: Pre-oxygenation with 100% oxygen Induction Type: IV induction

## 2021-10-19 NOTE — Discharge Instructions (Addendum)
Stacy Arabian, MD Total Joint Specialist [facility] [address] [place], [place] [address] [phone]  TOTAL KNEE REPLACEMENT POSTOPERATIVE DIRECTIONS    Knee Rehabilitation, Guidelines Following Surgery  Results after knee surgery are often greatly improved when you follow the exercise, range of motion and muscle strengthening exercises prescribed by your doctor. Safety measures are also important to protect the knee from further injury. If any of these exercises cause you to have increased pain or swelling in your knee joint, decrease the amount until you are comfortable again and slowly increase them. If you have problems or questions, call your caregiver or physical therapist for advice.   BLOOD CLOT PREVENTION Take a 10 mg Xarelto once a day for three weeks following surgery.  You may resume your vitamins/supplements once you have discontinued the Xarelto. Do not take any NSAIDs (Advil, Aleve, Ibuprofen, Meloxicam, etc.) until you have discontinued the Xarelto.    HOME CARE INSTRUCTIONS  Remove items at home which could result in a fall. This includes throw rugs or furniture in walking pathways.  ICE to the affected knee as much as tolerated. Icing helps control swelling. If the swelling is well controlled you will be more comfortable and rehab easier. Continue to use ice on the knee for pain and swelling from surgery. You may notice swelling that will progress down to the foot and ankle. This is normal after surgery. Elevate the leg when you are not up walking on it.    Continue to use the breathing machine which will help keep your temperature down. It is common for your temperature to cycle up and down following surgery, especially at night when you are not up moving around and exerting yourself. The breathing machine keeps your lungs expanded and your temperature down. Do not place pillow under the operative knee, focus on keeping the knee  straight while resting  DIET You may resume your previous home diet once you are discharged from the hospital.  DRESSING / WOUND CARE / SHOWERING Keep your bulky bandage on for 2 days. On the third post-operative day you may remove the Ace bandage and gauze. There is a waterproof adhesive bandage on your skin which will stay in place until your first follow-up appointment. Once you remove this you will not need to place another bandage You may begin showering 3 days following surgery, but do not submerge the incision under water.  ACTIVITY For the first 5 days, the key is rest and control of pain and swelling Do your home exercises twice a day starting on post-operative day 3. On the days you go to physical therapy, just do the home exercises once that day. You should rest, ice and elevate the leg for 50 minutes out of every hour. Get up and walk/stretch for 10 minutes per hour. After 5 days you can increase your activity slowly as tolerated. Walk with your walker as instructed. Use the walker until you are comfortable transitioning to a cane. Walk with the cane in the opposite hand of the operative leg. You may discontinue the cane once you are comfortable and walking steadily. Avoid periods of inactivity such as sitting longer than an hour when not asleep. This helps prevent blood clots.  You may discontinue the knee immobilizer once you are able to perform a straight leg raise while lying down. You may resume a sexual relationship in one month or when given the OK by your doctor.  You may return to work  once you are cleared by your doctor.  Do not drive a car for 6 weeks or until released by your surgeon.  Do not drive while taking narcotics.  TED HOSE STOCKINGS Wear the elastic stockings on both legs for three weeks following surgery during the day. You may remove them at night for sleeping.  WEIGHT BEARING Weight bearing as tolerated with assist device (walker, cane, etc) as directed,  use it as long as suggested by your surgeon or therapist, typically at least 4-6 weeks.  POSTOPERATIVE CONSTIPATION PROTOCOL Constipation - defined medically as fewer than three stools per week and severe constipation as less than one stool per week.  One of the most common issues patients have following surgery is constipation.  Even if you have a regular bowel pattern at home, your normal regimen is likely to be disrupted due to multiple reasons following surgery.  Combination of anesthesia, postoperative narcotics, change in appetite and fluid intake all can affect your bowels.  In order to avoid complications following surgery, here are some recommendations in order to help you during your recovery period.  Colace (docusate) - Pick up an over-the-counter form of Colace or another stool softener and take twice a day as long as you are requiring postoperative pain medications.  Take with a full glass of water daily.  If you experience loose stools or diarrhea, hold the colace until you stool forms back up. If your symptoms do not get better within 1 week or if they get worse, check with your doctor. Dulcolax (bisacodyl) - Pick up over-the-counter and take as directed by the product packaging as needed to assist with the movement of your bowels.  Take with a full glass of water.  Use this product as needed if not relieved by Colace only.  MiraLax (polyethylene glycol) - Pick up over-the-counter to have on hand. MiraLax is a solution that will increase the amount of water in your bowels to assist with bowel movements.  Take as directed and can mix with a glass of water, juice, soda, coffee, or tea. Take if you go more than two days without a movement. Do not use MiraLax more than once per day. Call your doctor if you are still constipated or irregular after using this medication for 7 days in a row.  If you continue to have problems with postoperative constipation, please contact the office for further  assistance and recommendations.  If you experience "the worst abdominal pain ever" or develop nausea or vomiting, please contact the office immediatly for further recommendations for treatment.  ITCHING If you experience itching with your medications, try taking only a single pain pill, or even half a pain pill at a time.  You can also use Benadryl over the counter for itching or also to help with sleep.   MEDICATIONS See your medication summary on the "After Visit Summary" that the nursing staff will review with you prior to discharge.  You may have some home medications which will be placed on hold until you complete the course of blood thinner medication.  It is important for you to complete the blood thinner medication as prescribed by your surgeon.  Continue your approved medications as instructed at time of discharge.  PRECAUTIONS If you experience chest pain or shortness of breath - call 911 immediately for transfer to the hospital emergency department.  If you develop a fever greater that 101 F, purulent drainage from wound, increased redness or drainage from wound, foul odor from the  wound/dressing, or calf pain - CONTACT YOUR SURGEON.                                                   FOLLOW-UP APPOINTMENTS Make sure you keep all of your appointments after your operation with your surgeon and caregivers. You should call the office at the above phone number and make an appointment for approximately two weeks after the date of your surgery or on the date instructed by your surgeon outlined in the "After Visit Summary".  RANGE OF MOTION AND STRENGTHENING EXERCISES  Rehabilitation of the knee is important following a knee injury or an operation. After just a few days of immobilization, the muscles of the thigh which control the knee become weakened and shrink (atrophy). Knee exercises are designed to build up the tone and strength of the thigh muscles and to improve knee motion. Often times heat  used for twenty to thirty minutes before working out will loosen up your tissues and help with improving the range of motion but do not use heat for the first two weeks following surgery. These exercises can be done on a training (exercise) mat, on the floor, on a table or on a bed. Use what ever works the best and is most comfortable for you Knee exercises include:  Leg Lifts - While your knee is still immobilized in a splint or cast, you can do straight leg raises. Lift the leg to 60 degrees, hold for 3 sec, and slowly lower the leg. Repeat 10-20 times 2-3 times daily. Perform this exercise against resistance later as your knee gets better.  Quad and Hamstring Sets - Tighten up the muscle on the front of the thigh (Quad) and hold for 5-10 sec. Repeat this 10-20 times hourly. Hamstring sets are done by pushing the foot backward against an object and holding for 5-10 sec. Repeat as with quad sets.  Leg Slides: Lying on your back, slowly slide your foot toward your buttocks, bending your knee up off the floor (only go as far as is comfortable). Then slowly slide your foot back down until your leg is flat on the floor again. Angel Wings: Lying on your back spread your legs to the side as far apart as you can without causing discomfort.  A rehabilitation program following serious knee injuries can speed recovery and prevent re-injury in the future due to weakened muscles. Contact your doctor or a physical therapist for more information on knee rehabilitation.   POST-OPERATIVE OPIOID TAPER INSTRUCTIONS: It is important to wean off of your opioid medication as soon as possible. If you do not need pain medication after your surgery it is ok to stop day one. Opioids include: Codeine, Hydrocodone(Norco, Vicodin), Oxycodone(Percocet, oxycontin) and hydromorphone amongst others.  Long term and even short term use of opiods can cause: Increased pain response Dependence Constipation Depression Respiratory  depression And more.  Withdrawal symptoms can include Flu like symptoms Nausea, vomiting And more Techniques to manage these symptoms Hydrate well Eat regular healthy meals Stay active Use relaxation techniques(deep breathing, meditating, yoga) Do Not substitute Alcohol to help with tapering If you have been on opioids for less than two weeks and do not have pain than it is ok to stop all together.  Plan to wean off of opioids This plan should start within one week post op  of your joint replacement. Maintain the same interval or time between taking each dose and first decrease the dose.  Cut the total daily intake of opioids by one tablet each day Next start to increase the time between doses. The last dose that should be eliminated is the evening dose.   IF YOU ARE TRANSFERRED TO A SKILLED REHAB FACILITY If the patient is transferred to a skilled rehab facility following release from the hospital, a list of the current medications will be sent to the facility for the patient to continue.  When discharged from the skilled rehab facility, please have the facility set up the patient's Hilda prior to being released. Also, the skilled facility will be responsible for providing the patient with their medications at time of release from the facility to include their pain medication, the muscle relaxants, and their blood thinner medication. If the patient is still at the rehab facility at time of the two week follow up appointment, the skilled rehab facility will also need to assist the patient in arranging follow up appointment in our office and any transportation needs.  MAKE SURE YOU:  Understand these instructions.  Get help right away if you are not doing well or get worse.   DENTAL ANTIBIOTICS:  In most cases prophylactic antibiotics for Dental procdeures after total joint surgery are not necessary.  Exceptions are as follows:  1. History of prior total joint  infection  2. Severely immunocompromised (Organ Transplant, cancer chemotherapy, Rheumatoid biologic meds such as [place])  3. Poorly controlled diabetes (A1C &gt; 8.0, blood glucose over 200)  If you have one of these conditions, contact your surgeon for an antibiotic prescription, prior to your dental procedure.    Pick up stool softner and laxative for home use following surgery while on pain medications. Do not submerge incision under water. Please use good hand washing techniques while changing dressing each day. May shower starting three days after surgery. Please use a clean towel to pat the incision dry following showers. Continue to use ice for pain and swelling after surgery. Do not use any lotions or creams on the incision until instructed by your surgeon.  Information on my medicine - XARELTO (Rivaroxaban)  This medication education was reviewed with me or my healthcare representative as part of my discharge preparation.  The pharmacist that spoke with me during my hospital stay was:    Why was Xarelto prescribed for you? Xarelto was prescribed for you to reduce the risk of blood clots forming after orthopedic surgery. The medical term for these abnormal blood clots is venous thromboembolism (VTE).  What do you need to know about xarelto ? Take your Xarelto ONCE DAILY at the same time every day. You may take it either with or without food.  If you have difficulty swallowing the tablet whole, you may crush it and mix in applesauce just prior to taking your dose.  Take Xarelto exactly as prescribed by your doctor and DO NOT stop taking Xarelto without talking to the doctor who prescribed the medication.  Stopping without other VTE prevention medication to take the place of Xarelto may increase your risk of developing a clot.  After discharge, you should have regular check-up appointments with your healthcare provider that is prescribing your Xarelto.    What do  you do if you miss a dose? If you miss a dose, take it as soon as you remember on the same day then continue your regularly scheduled once  daily regimen the next day. Do not take two doses of Xarelto on the same day.   Important Safety Information A possible side effect of Xarelto is bleeding. You should call your healthcare provider right away if you experience any of the following: Bleeding from an injury or your nose that does not stop. Unusual colored urine (red or dark brown) or unusual colored stools (red or black). Unusual bruising for unknown reasons. A serious fall or if you hit your head (even if there is no bleeding).  Some medicines may interact with Xarelto and might increase your risk of bleeding while on Xarelto. To help avoid this, consult your healthcare provider or pharmacist prior to using any new prescription or non-prescription medications, including herbals, vitamins, non-steroidal anti-inflammatory drugs (NSAIDs) and supplements.  This website has more information on Xarelto: [email or web address]

## 2021-10-19 NOTE — Anesthesia Procedure Notes (Signed)
Spinal  Patient location during procedure: OR Start time: 10/19/2021 8:07 AM End time: 10/19/2021 8:11 AM Reason for block: surgical anesthesia Staffing Performed: anesthesiologist  Anesthesiologist: Suzette Battiest, MD Preanesthetic Checklist Completed: patient identified, IV checked, site marked, risks and benefits discussed, surgical consent, monitors and equipment checked, pre-op evaluation and timeout performed Spinal Block Patient position: sitting Prep: DuraPrep Patient monitoring: heart rate, cardiac monitor, continuous pulse ox and blood pressure Approach: midline Location: L4-5 Injection technique: single-shot Needle Needle type: Pencan  Needle gauge: 24 G Needle length: 9 cm Assessment Sensory level: T4 Events: CSF return

## 2021-10-19 NOTE — Plan of Care (Signed)

## 2021-10-19 NOTE — Transfer of Care (Signed)
Immediate Anesthesia Transfer of Care Note  Patient: Stacy Ayers  Procedure(s) Performed: TOTAL KNEE ARTHROPLASTY (Left: Knee)  Patient Location: PACU  Anesthesia Type:MAC, Regional and Spinal  Level of Consciousness: awake and alert   Airway & Oxygen Therapy: Patient Spontanous Breathing and Patient connected to nasal cannula oxygen  Post-op Assessment: Report given to RN and Post -op Vital signs reviewed and stable  Post vital signs: Reviewed and stable  Last Vitals:  Vitals Value Taken Time  BP    Temp    Pulse 67 10/19/21 0935  Resp 15 10/19/21 0935  SpO2 100 % 10/19/21 0935  Vitals shown include unvalidated device data.  Last Pain:  Vitals:   10/19/21 0633  PainSc: 3       Patients Stated Pain Goal: 5 (11/00/34 9611)  Complications: No notable events documented.

## 2021-10-19 NOTE — Interval H&P Note (Signed)
History and Physical Interval Note:  10/19/2021 6:28 AM  Stacy Ayers  has presented today for surgery, with the diagnosis of left knee osteoarthritis.  The various methods of treatment have been discussed with the patient and family. After consideration of risks, benefits and other options for treatment, the patient has consented to  Procedure(s): TOTAL KNEE ARTHROPLASTY (Left) as a surgical intervention.  The patient's history has been reviewed, patient examined, no change in status, stable for surgery.  I have reviewed the patient's chart and labs.  Questions were answered to the patient's satisfaction.     Pilar Plate [name]

## 2021-10-19 NOTE — Anesthesia Postprocedure Evaluation (Signed)
Anesthesia Post Note  Patient: Stacy Ayers  Procedure(s) Performed: TOTAL KNEE ARTHROPLASTY (Left: Knee)     Patient location during evaluation: PACU Anesthesia Type: Spinal Level of consciousness: awake and alert Pain management: pain level controlled Vital Signs Assessment: post-procedure vital signs reviewed and stable Respiratory status: spontaneous breathing and respiratory function stable Cardiovascular status: blood pressure returned to baseline and stable Postop Assessment: spinal receding Anesthetic complications: no   No notable events documented.  Last Vitals:  Vitals:   10/19/21 1103 10/19/21 1315  BP: 137/70 (!) 148/75  Pulse: 69 80  Resp: 18 18  Temp: 36.9 C 37.6 C  SpO2: 99% 98%    Last Pain:  Vitals:   10/19/21 1455  TempSrc:   PainSc: 7                  Tiajuana Amass

## 2021-10-19 NOTE — Care Plan (Signed)
Ortho Bundle Case Management Note  Patient Details  Name: Stacy Ayers MRN: [id number] Date of Birth: 1941-04-28  L TKA on 10-19-21 DCP:  [facility] DME:  RW ordered through [facility]                   DME Arranged:  [facility] DME Agency:  [facility]  HH Arranged:  NA [place] Agency:  NA  Additional Comments: Please contact me with any questions of if this plan should need to change.  Marianne Sofia, RN,CCM [facility]  [phone] 10/19/2021, 8:03 AM

## 2021-10-19 NOTE — Progress Notes (Signed)
Orthopedic Tech Progress Note Patient Details:  Stacy Ayers 1941/05/19 [id number]  CPM Left Knee CPM Left Knee: On Left Knee Flexion (Degrees): 40 Left Knee Extension (Degrees): 10  Post Interventions Patient Tolerated: Well Instructions Provided: Care of device  [place] Pink 10/19/2021, 9:42 AM

## 2021-10-19 NOTE — Progress Notes (Signed)
Assisted Dr. Rob Fitzgerald with left, ultrasound guided, adductor canal block. Side rails up, monitors on throughout procedure. See vital signs in flow sheet. Tolerated Procedure well.  

## 2021-10-19 NOTE — Anesthesia Procedure Notes (Signed)
Anesthesia Regional Block: Adductor canal block   Pre-Anesthetic Checklist: , timeout performed,  Correct Patient, Correct Site, Correct Laterality,  Correct Procedure, Correct Position, site marked,  Risks and benefits discussed,  Surgical consent,  Pre-op evaluation,  At surgeon's request and post-op pain management  Laterality: Left  Prep: chloraprep       Needles:  Injection technique: Single-shot  Needle Type: Echogenic Needle     Needle Length: 9cm  Needle Gauge: 21     Additional Needles:   Procedures:,,,, ultrasound used (permanent image in chart),,    Narrative:  Start time: 10/19/2021 7:25 AM End time: 10/19/2021 7:31 AM Injection made incrementally with aspirations every 5 mL.  Performed by: Personally  Anesthesiologist: Suzette Battiest, MD

## 2021-10-19 NOTE — Op Note (Signed)
OPERATIVE REPORT-TOTAL KNEE ARTHROPLASTY   Pre-operative diagnosis- Osteoarthritis  Left knee(s)  Post-operative diagnosis- Osteoarthritis Left knee(s)  Procedure-  Left  Total Knee Arthroplasty  Surgeon- Dione Plover. Lithzy Bernard, MD  Assistant- Molli Barrows, PA-C   Anesthesia-   Adductor canal block and spinal  EBL-25 mL   Drains None  Tourniquet time-  Total Tourniquet Time Documented: Thigh (Left) - 29 minutes Total: Thigh (Left) - 29 minutes     Complications- None  Condition-PACU - hemodynamically stable.   Brief Clinical Note  Stacy Ayers is a 80 y.o. year old female with end stage OA of her left knee with progressively worsening pain and dysfunction. She has constant pain, with activity and at rest and significant functional deficits with difficulties even with ADLs. She has had extensive non-op management including analgesics, injections of cortisone and viscosupplements, and home exercise program, but remains in significant pain with significant dysfunction. Radiographs show bone on bone arthritis medial and patellofemoral. She presents now for left Total Knee Arthroplasty.     Procedure in detail---   The patient is brought into the operating room and positioned supine on the operating table. After successful administration of  Adductor canal block and spinal,   a tourniquet is placed high on the  Left thigh(s) and the lower extremity is prepped and draped in the usual sterile fashion. Time out is performed by the operating team and then the  Left lower extremity is wrapped in Esmarch, knee flexed and the tourniquet inflated to 300 mmHg.       A midline incision is made with a ten blade through the subcutaneous tissue to the level of the extensor mechanism. A fresh blade is used to make a medial parapatellar arthrotomy. Soft tissue over the proximal medial tibia is subperiosteally elevated to the joint line with a knife and into the semimembranosus bursa with a Cobb  elevator. Soft tissue over the proximal lateral tibia is elevated with attention being paid to avoiding the patellar tendon on the tibial tubercle. The patella is everted, knee flexed 90 degrees and the ACL and PCL are removed. Findings are bone on bone medial and patellofemoral with large global osteophytes        The drill is used to create a starting hole in the distal femur and the canal is thoroughly irrigated with sterile saline to remove the fatty contents. The 5 degree Left  valgus alignment guide is placed into the femoral canal and the distal femoral cutting block is pinned to remove 9 mm off the distal femur. Resection is made with an oscillating saw.      The tibia is subluxed forward and the menisci are removed. The extramedullary alignment guide is placed referencing proximally at the medial aspect of the tibial tubercle and distally along the second metatarsal axis and tibial crest. The block is pinned to remove 80mm off the more deficient medial  side. Resection is made with an oscillating saw. Size 5is the most appropriate size for the tibia and the proximal tibia is prepared with the modular drill and keel punch for that size.      The femoral sizing guide is placed and size 5 is most appropriate. Rotation is marked off the epicondylar axis and confirmed by creating a rectangular flexion gap at 90 degrees. The size 5 cutting block is pinned in this rotation and the anterior, posterior and chamfer cuts are made with the oscillating saw. The intercondylar block is then placed and that cut is  made.      Trial size 5 tibial component, trial size 5 posterior stabilized femur and a 10  mm posterior stabilized rotating platform insert trial is placed. Full extension is achieved with excellent varus/valgus and anterior/posterior balance throughout full range of motion. The patella is everted and thickness measured to be 22  mm. Free hand resection is taken to 12 mm, a 38 template is placed, lug holes  are drilled, trial patella is placed, and it tracks normally. Osteophytes are removed off the posterior femur with the trial in place. All trials are removed and the cut bone surfaces prepared with pulsatile lavage. Cement is mixed and once ready for implantation, the size 5 tibial implant, size  5 posterior stabilized femoral component, and the size 38 patella are cemented in place and the patella is held with the clamp. The trial insert is placed and the knee held in full extension. The Exparel (20 ml mixed with 60 ml saline) is injected into the extensor mechanism, posterior capsule, medial and lateral gutters and subcutaneous tissues.  All extruded cement is removed and once the cement is hard the permanent 10 mm posterior stabilized rotating platform insert is placed into the tibial tray.      The wound is copiously irrigated with saline solution and the extensor mechanism closed with # 0 Stratofix suture. The tourniquet is released for a total tourniquet time of 29  minutes. Flexion against gravity is 140 degrees and the patella tracks normally. Subcutaneous tissue is closed with 2.0 vicryl and subcuticular with running 4.0 Monocryl. The incision is cleaned and dried and steri-strips and a bulky sterile dressing are applied. The limb is placed into a knee immobilizer and the patient is awakened and transported to recovery in stable condition.      Please note that a surgical assistant was a medical necessity for this procedure in order to perform it in a safe and expeditious manner. Surgical assistant was necessary to retract the ligaments and vital neurovascular structures to prevent injury to them and also necessary for proper positioning of the limb to allow for anatomic placement of the prosthesis.   Dione Plover Maximino Cozzolino, MD    10/19/2021, 9:08 AM

## 2021-10-19 NOTE — Evaluation (Signed)
Physical Therapy Evaluation Patient Details Name: Stacy Ayers MRN: [id number] DOB: Apr 05, 1941 Today's Date: 10/19/2021  History of Present Illness  80 yo female s/p L TKA. PMH: R rTSA, DM, HTN, depression  Clinical Impression  Pt is s/p TKA resulting in the deficits listed below (see PT Problem List).  Pt amb ~ 5' with RW and min assist. Limited by nausea, fatigue  Pt will benefit from skilled PT to increase their independence and safety with mobility to allow discharge to the venue listed below.         Recommendations for follow up therapy are one component of a multi-disciplinary discharge planning process, led by the attending physician.  Recommendations may be updated based on patient status, additional functional criteria and insurance authorization.  Follow Up Recommendations Follow physician's recommendations for discharge plan and follow up therapies (pt states SNF, MD confirms)    Assistance Recommended at Discharge Frequent or constant Supervision/Assistance  Functional Status Assessment Patient has had a recent decline in their functional status and demonstrates the ability to make significant improvements in function in a reasonable and predictable amount of time.  Equipment Recommendations  Rolling walker (2 wheels)    Recommendations for Other Services       Precautions / Restrictions Precautions Precautions: Fall;Knee Required Braces or Orthoses: Knee Immobilizer - Left Knee Immobilizer - Left: Discontinue once straight leg raise with < 10 degree lag Restrictions Weight Bearing Restrictions: No LLE Weight Bearing: Weight bearing as tolerated      Mobility  Bed Mobility Overal bed mobility: Needs Assistance Bed Mobility: Supine to Sit     Supine to sit: Min assist     General bed mobility comments: assist with LLE adn trunk to upright    Transfers Overall transfer level: Needs assistance Equipment used: Rolling walker (2 wheels) Transfers: Sit  to/from Stand Sit to Stand: Min assist           General transfer comment: cues for hand placement    Ambulation/Gait Ambulation/Gait assistance: Min assist Gait Distance (Feet): 5 Feet Assistive device: Rolling walker (2 wheels) Gait Pattern/deviations: Step-to pattern;Decreased stance time - left       General Gait Details: cues for sequence and RW position. distance limited by nausea and fatigue  Stairs            Wheelchair Mobility    Modified Rankin (Stroke Patients Only)       Balance                                             Pertinent Vitals/Pain Pain Assessment: 0-10 Pain Score: 5  Pain Location: L knee Pain Descriptors / Indicators: Aching;Grimacing;Sore Pain Intervention(s): Limited activity within patient's tolerance;Monitored during session;Premedicated before session;Repositioned;Ice applied    Home Living Family/patient expects to be discharged to:: Private residence Living Arrangements: Other (Comment) Available Help at Discharge: [place]               Additional Comments: resides in IND living at [facility], will return to SNF for ~ 2wks    Prior Function Prior Level of Function : Independent/Modified Independent                     Hand Dominance        Extremity/Trunk Assessment   Upper Extremity Assessment Upper Extremity Assessment: Overall WFL for tasks assessed  Lower Extremity Assessment Lower Extremity Assessment: LLE deficits/detail LLE Deficits / Details: ankle WFL, knee extension and hip flexion 2+/5       Communication      Cognition Arousal/Alertness: Awake/alert Behavior During Therapy: WFL for tasks assessed/performed Overall Cognitive Status: Within Functional Limits for tasks assessed                                          General Comments      Exercises Total Joint Exercises Ankle Circles/Pumps: AROM;Both;10 reps    Assessment/Plan    PT Assessment Patient needs continued PT services  PT Problem List Decreased strength;Decreased mobility;Decreased activity tolerance;Decreased range of motion;Decreased knowledge of use of DME       PT Treatment Interventions DME instruction;Therapeutic activities;Gait training;Functional mobility training;Therapeutic exercise;Patient/family education;Stair training    PT Goals (Current goals can be found in the Care Plan section)  Acute Rehab PT Goals Patient Stated Goal: have less pain PT Goal Formulation: With patient Time For Goal Achievement: 10/26/21 Potential to Achieve Goals: Good    Frequency 7X/week   Barriers to discharge        Co-evaluation               AM-PAC PT "6 Clicks" Mobility  Outcome Measure Help needed turning from your back to your side while in a flat bed without using bedrails?: A Little Help needed moving from lying on your back to sitting on the side of a flat bed without using bedrails?: A Little Help needed moving to and from a bed to a chair (including a wheelchair)?: A Little Help needed standing up from a chair using your arms (e.g., wheelchair or bedside chair)?: A Little Help needed to walk in hospital room?: A Little Help needed climbing 3-5 steps with a railing? : A Lot 6 Click Score: 17    End of Session Equipment Utilized During Treatment: Gait belt Activity Tolerance: Patient tolerated treatment well Patient left: in chair;with call bell/phone within reach;with chair alarm set;with family/visitor present   PT Visit Diagnosis: Other abnormalities of gait and mobility (R26.89)    Time: 6761-9509 PT Time Calculation (min) (ACUTE ONLY): 26 min   Charges:   PT Evaluation $PT Eval Low Complexity: 1 Low PT Treatments $Gait Training: 8-22 mins        Baxter Flattery, PT  Acute Rehab Dept ([place]) [phone] Pager [phone]  10/19/2021   [facility] 10/19/2021, 4:19 PM

## 2021-10-19 NOTE — Anesthesia Preprocedure Evaluation (Signed)
Anesthesia Evaluation  Patient identified by MRN, date of birth, ID band Patient awake    Reviewed: Allergy & Precautions, NPO status , Patient's Chart, lab work & pertinent test results  History of Anesthesia Complications Negative for: history of anesthetic complications  Airway Mallampati: II  TM Distance: >3 FB Neck ROM: Full    Dental  (+) Dental Advisory Given   Pulmonary neg shortness of breath, asthma , neg sleep apnea, neg COPD, neg recent URI, former smoker,    breath sounds clear to auscultation       Cardiovascular hypertension, Pt. on medications and Pt. on home beta blockers (-) angina(-) Past MI and (-) CHF  Rhythm:Regular     Neuro/Psych  Headaches, PSYCHIATRIC DISORDERS Anxiety Depression  Neuromuscular disease    GI/Hepatic Neg liver ROS, GERD  Medicated and Controlled,  Endo/Other  diabetesHypothyroidism   Renal/GU negative Renal ROS     Musculoskeletal  (+) Arthritis ,   Abdominal   Peds  Hematology  (+) Blood dyscrasia, anemia ,   Anesthesia Other Findings   Reproductive/Obstetrics                             Lab Results  Component Value Date   WBC 4.0 10/09/2021   HGB 11.2 (L) 10/09/2021   HCT 35.0 (L) 10/09/2021   MCV 94.3 10/09/2021   PLT 217 10/09/2021   Lab Results  Component Value Date   CREATININE 0.61 10/09/2021   BUN 9 10/09/2021   NA 139 10/09/2021   K 4.7 10/09/2021   CL 106 10/09/2021   CO2 28 10/09/2021    Anesthesia Physical  Anesthesia Plan  ASA: 2  Anesthesia Plan: Spinal and MAC   Post-op Pain Management:  Regional for Post-op pain and Regional block and Ofirmev IV (intra-op)   Induction:   PONV Risk Score and Plan: 3 and Ondansetron, Dexamethasone and Propofol infusion  Airway Management Planned: Natural Airway and Simple Face Mask  Additional Equipment: None  Intra-op Plan:   Post-operative Plan:   Informed Consent: I  have reviewed the patients History and Physical, chart, labs and discussed the procedure including the risks, benefits and alternatives for the proposed anesthesia with the patient or authorized representative who has indicated his/her understanding and acceptance.     Dental advisory given  Plan Discussed with: CRNA and Surgeon  Anesthesia Plan Comments: (See PAT note 07/09/2020, Konrad Felix, PA-C)        Anesthesia Quick Evaluation

## 2021-10-20 ENCOUNTER — Encounter (HOSPITAL_COMMUNITY): Payer: Self-pay | Admitting: Orthopedic Surgery

## 2021-10-20 DIAGNOSIS — I10 Essential (primary) hypertension: Secondary | ICD-10-CM | POA: Diagnosis not present

## 2021-10-20 DIAGNOSIS — J45909 Unspecified asthma, uncomplicated: Secondary | ICD-10-CM | POA: Diagnosis not present

## 2021-10-20 DIAGNOSIS — Z87891 Personal history of nicotine dependence: Secondary | ICD-10-CM | POA: Diagnosis not present

## 2021-10-20 DIAGNOSIS — Z20822 Contact with and (suspected) exposure to covid-19: Secondary | ICD-10-CM | POA: Diagnosis not present

## 2021-10-20 DIAGNOSIS — Z96611 Presence of right artificial shoulder joint: Secondary | ICD-10-CM | POA: Diagnosis not present

## 2021-10-20 DIAGNOSIS — M1712 Unilateral primary osteoarthritis, left knee: Secondary | ICD-10-CM | POA: Diagnosis not present

## 2021-10-20 DIAGNOSIS — E039 Hypothyroidism, unspecified: Secondary | ICD-10-CM | POA: Diagnosis not present

## 2021-10-20 DIAGNOSIS — Z79899 Other long term (current) drug therapy: Secondary | ICD-10-CM | POA: Diagnosis not present

## 2021-10-20 DIAGNOSIS — E119 Type 2 diabetes mellitus without complications: Secondary | ICD-10-CM | POA: Diagnosis not present

## 2021-10-20 LAB — CBC
HCT: 26.6 % — ABNORMAL LOW (ref 36.0–46.0)
HCT: 28 % — ABNORMAL LOW (ref 36.0–46.0)
Hemoglobin: 8.6 g/dL — ABNORMAL LOW (ref 12.0–15.0)
Hemoglobin: 9.1 g/dL — ABNORMAL LOW (ref 12.0–15.0)
MCH: 30.4 pg (ref 26.0–34.0)
MCH: 30.6 pg (ref 26.0–34.0)
MCHC: 32.3 g/dL (ref 30.0–36.0)
MCHC: 32.5 g/dL (ref 30.0–36.0)
MCV: 94 fL (ref 80.0–100.0)
MCV: 94.3 fL (ref 80.0–100.0)
Platelets: 143 10*3/uL — ABNORMAL LOW (ref 150–400)
Platelets: 140 10*3/uL — ABNORMAL LOW (ref 150–400)
RBC: 2.83 MIL/uL — ABNORMAL LOW (ref 3.87–5.11)
RBC: 2.97 MIL/uL — ABNORMAL LOW (ref 3.87–5.11)
RDW: 14.2 % (ref 11.5–15.5)
RDW: 14.3 % (ref 11.5–15.5)
WBC: 7.9 10*3/uL (ref 4.0–10.5)
WBC: 9.5 10*3/uL (ref 4.0–10.5)
nRBC: 0 % (ref 0.0–0.2)
nRBC: 0 % (ref 0.0–0.2)

## 2021-10-20 LAB — BASIC METABOLIC PANEL
Anion gap: 5 (ref 5–15)
BUN: 9 mg/dL (ref 8–23)
CO2: 26 mmol/L (ref 22–32)
Calcium: 8 mg/dL — ABNORMAL LOW (ref 8.9–10.3)
Chloride: 105 mmol/L (ref 98–111)
Creatinine, Ser: 0.59 mg/dL (ref 0.44–1.00)
GFR, Estimated: >60 mL/min (ref >60)
Glucose, Bld: 127 mg/dL — ABNORMAL HIGH (ref 70–99)
Potassium: 3.9 mmol/L (ref 3.5–5.1)
Sodium: 136 mmol/L (ref 135–145)

## 2021-10-20 LAB — RESP PANEL BY RT-PCR (FLU A&B, COVID) ARPGX2
Influenza A by PCR: NEGATIVE
Influenza B by PCR: NEGATIVE
SARS Coronavirus 2 by RT PCR: NEGATIVE

## 2021-10-20 MED ORDER — METHOCARBAMOL 500 MG PO TABS: 500.0000 mg | ORAL_TABLET | Freq: Four times a day (QID) | ORAL | 0 refills | Status: DC | PRN

## 2021-10-20 MED ORDER — HYDROMORPHONE HCL 2 MG PO TABS
2.0000 mg | ORAL_TABLET | ORAL | 0 refills | Status: DC | PRN
Start: 2021-10-20 — End: 2021-10-24

## 2021-10-20 MED ORDER — RIVAROXABAN 10 MG PO TABS
10.0000 mg | ORAL_TABLET | Freq: Every day | ORAL | 0 refills | Status: AC
Start: 2021-10-21 — End: 2021-11-10

## 2021-10-20 NOTE — Progress Notes (Signed)
Physical Therapy Treatment Patient Details Name: Stacy Ayers MRN: [id number] DOB: 1941-08-30 Today's Date: 10/20/2021   History of Present Illness 80 yo female s/p L TKA. PMH: R rTSA, DM, HTN, depression    PT Comments    Progressing toward PT goals, plan is for SNF at [facility] post acute    Recommendations for follow up therapy are one component of a multi-disciplinary discharge planning process, led by the attending physician.  Recommendations may be updated based on patient status, additional functional criteria and insurance authorization.  Follow Up Recommendations  Follow physician's recommendations for discharge plan and follow up therapies (SNF at [facility])     Assistance Recommended at Discharge Frequent or constant Supervision/Assistance  Equipment Recommendations  Rolling walker (2 wheels)    Recommendations for Other Services       Precautions / Restrictions Precautions Precautions: Fall;Knee Required Braces or Orthoses: Knee Immobilizer - Left Knee Immobilizer - Left: Discontinue once straight leg raise with < 10 degree lag Restrictions Weight Bearing Restrictions: No LLE Weight Bearing: Weight bearing as tolerated     Mobility  Bed Mobility Overal bed mobility: Needs Assistance Bed Mobility: Supine to Sit     Supine to sit: Min guard     General bed mobility comments: cues to self assist , min/guard for safety    Transfers Overall transfer level: Needs assistance Equipment used: Rolling walker (2 wheels) Transfers: Sit to/from Stand Sit to Stand: Min guard;Min assist           General transfer comment: cues for hand placement, LLE management and to control descent    Ambulation/Gait Ambulation/Gait assistance: Min assist Gait Distance (Feet): 50 Feet (10' more) Assistive device: Rolling walker (2 wheels) Gait Pattern/deviations: Step-to pattern;Decreased stance time - left       General Gait Details: cues for sequence and RW  position   Stairs             Wheelchair Mobility    Modified Rankin (Stroke Patients Only)       Balance                                            Cognition Arousal/Alertness: Awake/alert Behavior During Therapy: WFL for tasks assessed/performed Overall Cognitive Status: Within Functional Limits for tasks assessed                                          Exercises Total Joint Exercises Ankle Circles/Pumps: AROM;Both;10 reps Quad Sets: AROM;Both;10 reps Heel Slides: AAROM;Left;5 reps Straight Leg Raises: AROM;5 reps;Left    General Comments        Pertinent Vitals/Pain Pain Assessment: 0-10 Pain Score: 3  Pain Location: L knee Pain Descriptors / Indicators: Aching;Grimacing;Sore Pain Intervention(s): Limited activity within patient's tolerance;Monitored during session;Premedicated before session;Repositioned    Home Living                          Prior Function            PT Goals (current goals can now be found in the care plan section) Acute Rehab PT Goals Patient Stated Goal: have less pain PT Goal Formulation: With patient Time For Goal Achievement: 10/26/21 Potential to Achieve Goals: Good Progress towards PT goals:  Progressing toward goals    Frequency    7X/week      PT Plan Current plan remains appropriate    Co-evaluation              AM-PAC PT "6 Clicks" Mobility   Outcome Measure  Help needed turning from your back to your side while in a flat bed without using bedrails?: A Little Help needed moving from lying on your back to sitting on the side of a flat bed without using bedrails?: A Little Help needed moving to and from a bed to a chair (including a wheelchair)?: A Little Help needed standing up from a chair using your arms (e.g., wheelchair or bedside chair)?: A Little Help needed to walk in hospital room?: A Little Help needed climbing 3-5 steps with a railing? : A  Lot 6 Click Score: 17    End of Session Equipment Utilized During Treatment: Gait belt Activity Tolerance: Patient tolerated treatment well Patient left: in chair;with call bell/phone within reach;with chair alarm set Nurse Communication: Mobility status PT Visit Diagnosis: Other abnormalities of gait and mobility (R26.89)     Time: 8832-5498 PT Time Calculation (min) (ACUTE ONLY): 27 min  Charges:  $Gait Training: 8-22 mins $Therapeutic Exercise: 8-22 mins                     Baxter Flattery, PT  Acute Rehab Dept ([place]) [phone] Pager [phone]  10/20/2021    [facility] 10/20/2021, 12:06 PM

## 2021-10-20 NOTE — Progress Notes (Signed)
Report called to Katharine Look at [employer] at [phone].   All questions answered.    Patient being transported by daughter, Parke Poisson to [place].     [place], Therapist, sports

## 2021-10-20 NOTE — Discharge Summary (Signed)
Physician Discharge Summary   Patient ID: Stacy Ayers MRN: [id number] DOB/AGE: 01-07-1941 80 y.o.  Admit date: 10/19/2021 Discharge date: 10/20/2021  Primary Diagnosis: Osteoarthritis, left knee   Admission Diagnoses:  Past Medical History:  Diagnosis Date   Abnormal liver function tests    Allergy    Anxiety    Arthritis    knees, lower back    Asthma    Cancer ([place])    Cataract    left eye growing cataract    Depression    Diabetes mellitus    had gastric bypass DM resolved-    Diverticulosis    GERD (gastroesophageal reflux disease)    Headache    Previous migraines   Heart murmur    History of colon polyps    adenomatous   Hyperlipidemia    past hx- resloved after gastric bypass    Hypertension    Hypothyroidism    IBS (irritable bowel syndrome)    Iron deficiency anemia    Osteoporosis    Discharge Diagnoses:   Principal Problem:   OA (osteoarthritis) of knee Active Problems:   Primary osteoarthritis of left knee  Estimated body mass index is 26.73 kg/m as calculated from the following:   Height as of this encounter: 5' 7.5" (1.715 m).   Weight as of this encounter: 78.6 kg.  Procedure:  Procedure(s) (LRB): TOTAL KNEE ARTHROPLASTY (Left)   Consults: None  HPI: Stacy Ayers is a 80 y.o. year old female with end stage OA of her left knee with progressively worsening pain and dysfunction. She has constant pain, with activity and at rest and significant functional deficits with difficulties even with ADLs. She has had extensive non-op management including analgesics, injections of cortisone and viscosupplements, and home exercise program, but remains in significant pain with significant dysfunction. Radiographs show bone on bone arthritis medial and patellofemoral. She presents now for left Total Knee Arthroplasty.   Laboratory Data: Admission on 10/19/2021  Component Date Value Ref Range Status   ABO/RH(D) 10/19/2021    Final                    Value:O POS Performed at [facility], [place] [address], [place], [place] [address]    Glucose-Capillary 10/19/2021 120 (H)  70 - 99 mg/dL Final   Glucose reference range applies only to samples taken after fasting for at least 8 hours.   Glucose-Capillary 10/19/2021 107 (H)  70 - 99 mg/dL Final   Glucose reference range applies only to samples taken after fasting for at least 8 hours.   WBC 10/20/2021 7.9  4.0 - 10.5 K/uL Final   RBC 10/20/2021 2.83 (L)  3.87 - 5.11 MIL/uL Final   Hemoglobin 10/20/2021 8.6 (L)  12.0 - 15.0 g/dL Final   HCT 10/20/2021 26.6 (L)  36.0 - 46.0 % Final   MCV 10/20/2021 94.0  80.0 - 100.0 fL Final   MCH 10/20/2021 30.4  26.0 - 34.0 pg Final   MCHC 10/20/2021 32.3  30.0 - 36.0 g/dL Final   RDW 10/20/2021 14.2  11.5 - 15.5 % Final   Platelets 10/20/2021 143 (L)  150 - 400 K/uL Final   nRBC 10/20/2021 0.0  0.0 - 0.2 % Final   Performed at [facility], [place] [address], [place], [place] [address]   Sodium 10/20/2021 136  135 - 145 mmol/L Final   Potassium 10/20/2021 3.9  3.5 - 5.1 mmol/L Final   Chloride 10/20/2021 105  98 - 111 mmol/L Final   CO2 10/20/2021 26  22 - 32 mmol/L Final   Glucose, Bld 10/20/2021 127 (H)  70 - 99 mg/dL Final   Glucose reference range applies only to samples taken after fasting for at least 8 hours.   BUN 10/20/2021 9  8 - 23 mg/dL Final   Creatinine, Ser 10/20/2021 0.59  0.44 - 1.00 mg/dL Final   Calcium 10/20/2021 8.0 (L)  8.9 - 10.3 mg/dL Final   GFR, Estimated 10/20/2021 >60  >60 mL/min Final   Comment: (NOTE) Calculated using the CKD-EPI Creatinine Equation (2021)    Anion gap 10/20/2021 5  5 - 15 Final   Performed at [facility], [place] [address], [place], [place] [address]   WBC 10/20/2021 9.5  4.0 - 10.5 K/uL Final   RBC 10/20/2021 2.97 (L)  3.87 - 5.11 MIL/uL Final   Hemoglobin 10/20/2021 9.1 (L)  12.0 - 15.0 g/dL Final   HCT 10/20/2021 28.0 (L)  36.0 - 46.0 % Final    MCV 10/20/2021 94.3  80.0 - 100.0 fL Final   MCH 10/20/2021 30.6  26.0 - 34.0 pg Final   MCHC 10/20/2021 32.5  30.0 - 36.0 g/dL Final   RDW 10/20/2021 14.3  11.5 - 15.5 % Final   Platelets 10/20/2021 140 (L)  150 - 400 K/uL Final   nRBC 10/20/2021 0.0  0.0 - 0.2 % Final   Performed at [facility], [place] [address], [place], [place] [address]   SARS Coronavirus 2 by RT PCR 10/20/2021 NEGATIVE  NEGATIVE Final   Comment: (NOTE) SARS-CoV-2 target nucleic acids are NOT DETECTED.  The SARS-CoV-2 RNA is generally detectable in upper respiratory specimens during the acute phase of infection. The lowest concentration of SARS-CoV-2 viral copies this assay can detect is 138 copies/mL. A negative result does not preclude SARS-Cov-2 infection and should not be used as the sole basis for treatment or other patient management decisions. A negative result may occur with  improper specimen collection/handling, submission of specimen other than nasopharyngeal swab, presence of viral mutation(s) within the areas targeted by this assay, and inadequate number of viral copies(<138 copies/mL). A negative result must be combined with clinical observations, patient history, and epidemiological information. The expected result is Negative.  Fact Sheet for Patients:  EntrepreneurPulse.com.au  Fact Sheet for Healthcare Providers:  IncredibleEmployment.be  This test is no                          t yet approved or cleared by the [place] FDA and  has been authorized for detection and/or diagnosis of SARS-CoV-2 by FDA under an Emergency Use Authorization (EUA). This EUA will remain  in effect (meaning this test can be used) for the duration of the COVID-19 declaration under Section 564(b)(1) of the Act, 21 U.S.C.section 360bbb-3(b)(1), unless the authorization is terminated  or revoked sooner.       Influenza A by PCR 10/20/2021 NEGATIVE  NEGATIVE  Final   Influenza B by PCR 10/20/2021 NEGATIVE  NEGATIVE Final   Comment: (NOTE) The Xpert Xpress SARS-CoV-2/FLU/RSV plus assay is intended as an aid in the diagnosis of influenza from Nasopharyngeal swab specimens and should not be used as a sole basis for treatment. Nasal washings and aspirates are unacceptable for Xpert Xpress SARS-CoV-2/FLU/RSV testing.  Fact Sheet for Patients: EntrepreneurPulse.com.au  Fact Sheet for Healthcare Providers: IncredibleEmployment.be  This test is not yet approved or cleared by  the [employer] and has been authorized for detection and/or diagnosis of SARS-CoV-2 by FDA under an Emergency Use Authorization (EUA). This EUA will remain in effect (meaning this test can be used) for the duration of the COVID-19 declaration under Section 564(b)(1) of the Act, 21 U.S.C. section 360bbb-3(b)(1), unless the authorization is terminated or revoked.  Performed at [facility], [place] [address], [place], [place] [address]   Orders Only on 10/16/2021  Component Date Value Ref Range Status   SARS Coronavirus 2 10/16/2021 RESULT: NEGATIVE   Final   Comment: RESULT: NEGATIVESARS-CoV-2 INTERPRETATION:A NEGATIVE  test result means that SARS-CoV-2 RNA was not present in the specimen above the limit of detection of this test. This does not preclude a possible SARS-CoV-2 infection and should not be used as the  sole basis for patient management decisions. Negative results must be combined with clinical observations, patient history, and epidemiological information. Optimum specimen types and timing for peak viral levels during infections caused by SARS-CoV-2  have not been determined. Collection of multiple specimens or types of specimens may be necessary to detect virus. Improper specimen collection and handling, sequence variability under primers/probes, or organism present below the limit of detection may  lead to  false negative results. Positive and negative predictive values of testing are highly dependent on prevalence. False negative test results are more likely when prevalence of disease is high.The expected result is NEGATIVE.Fact S                          heet for  Healthcare Providers: LocalChronicle.no Sheet for Patients: SalonLookup.es Reference Range - Negative   Hospital Outpatient Visit on 10/09/2021  Component Date Value Ref Range Status   MRSA, PCR 10/09/2021 NEGATIVE  NEGATIVE Final   Staphylococcus aureus 10/09/2021 NEGATIVE  NEGATIVE Final   Comment: (NOTE) The Xpert SA Assay (FDA approved for NASAL specimens in patients 70 years of age and older), is one component of a comprehensive surveillance program. It is not intended to diagnose infection nor to guide or monitor treatment. Performed at [facility], [place] [address] [place], [place] [address]    WBC 10/09/2021 4.0  4.0 - 10.5 K/uL Final   RBC 10/09/2021 3.71 (L)  3.87 - 5.11 MIL/uL Final   Hemoglobin 10/09/2021 11.2 (L)  12.0 - 15.0 g/dL Final   HCT 10/09/2021 35.0 (L)  36.0 - 46.0 % Final   MCV 10/09/2021 94.3  80.0 - 100.0 fL Final   MCH 10/09/2021 30.2  26.0 - 34.0 pg Final   MCHC 10/09/2021 32.0  30.0 - 36.0 g/dL Final   RDW 10/09/2021 14.3  11.5 - 15.5 % Final   Platelets 10/09/2021 217  150 - 400 K/uL Final   nRBC 10/09/2021 0.0  0.0 - 0.2 % Final   Performed at [facility], [place] [address] [place], [place] [address]   Sodium 10/09/2021 139  135 - 145 mmol/L Final   Potassium 10/09/2021 4.7  3.5 - 5.1 mmol/L Final   Chloride 10/09/2021 106  98 - 111 mmol/L Final   CO2 10/09/2021 28  22 - 32 mmol/L Final   Glucose, Bld 10/09/2021 122 (H)  70 - 99 mg/dL Final   Glucose reference range applies only to samples taken after fasting for at least 8 hours.   BUN 10/09/2021 9  8 - 23 mg/dL Final   Creatinine, Ser 10/09/2021  0.61  0.44 - 1.00 mg/dL Final  Calcium 10/09/2021 9.2  8.9 - 10.3 mg/dL Final   Total Protein 10/09/2021 7.0  6.5 - 8.1 g/dL Final   Albumin 10/09/2021 4.2  3.5 - 5.0 g/dL Final   AST 10/09/2021 24  15 - 41 U/L Final   ALT 10/09/2021 20  0 - 44 U/L Final   Alkaline Phosphatase 10/09/2021 48  38 - 126 U/L Final   Total Bilirubin 10/09/2021 0.8  0.3 - 1.2 mg/dL Final   GFR, Estimated 10/09/2021 >60  >60 mL/min Final   Comment: (NOTE) Calculated using the CKD-EPI Creatinine Equation (2021)    Anion gap 10/09/2021 5  5 - 15 Final   Performed at [facility], [place] [address] [place], [place] [address]   ABO/RH(D) 10/09/2021 O POS   Final   Antibody Screen 10/09/2021 NEG   Final   Sample Expiration 10/09/2021 10/22/2021,2359   Final   Extend sample reason 10/09/2021    Final                   Value:NO TRANSFUSIONS OR PREGNANCY IN THE PAST 3 MONTHS Performed at [place] [address], [place], [place] [address]    Prothrombin Time 10/09/2021 13.2  11.4 - 15.2 seconds Final   INR 10/09/2021 1.0  0.8 - 1.2 Final   Comment: (NOTE) INR goal varies based on device and disease states. Performed at [facility], [place] [address], [place], [place] [address]    Glucose-Capillary 10/09/2021 106 (H)  70 - 99 mg/dL Final   Glucose reference range applies only to samples taken after fasting for at least 8 hours.     X-Rays:No results found.  EKG: Orders placed or performed during the hospital encounter of 10/09/21   EKG 12 lead per protocol   EKG 12 lead per protocol     Hospital Course: Stacy Ayers is a 80 y.o. who was admitted to [facility]. They were brought to the operating room on 10/19/2021 and underwent Procedure(s): TOTAL KNEE ARTHROPLASTY.  Patient tolerated the procedure well and was later transferred to the recovery room and then to the orthopaedic floor for postoperative care. They were given PO and IV  analgesics for pain control following their surgery. They were given 24 hours of postoperative antibiotics of  Anti-infectives (From admission, onward)    Start     Dose/Rate Route Frequency Ordered Stop   10/19/21 1400  ceFAZolin (ANCEF) IVPB 2g/100 mL premix        2 g 200 mL/hr over 30 Minutes Intravenous Every 6 hours 10/19/21 1106 10/19/21 2133   10/19/21 0615  ceFAZolin (ANCEF) IVPB 2g/100 mL premix        2 g 200 mL/hr over 30 Minutes Intravenous On call to O.R. 10/19/21 4196 10/19/21 0818      and started on DVT prophylaxis in the form of Xarelto.   PT and OT were ordered for total joint protocol. Discharge planning consulted to help with postop disposition and equipment needs.  Patient had a good night on the evening of surgery. They started to get up OOB with therapy on POD #0. Pt was seen during rounds and was ready for discharge pending progress with therapy. Hemoglobin was 8.8 that AM, rechecked in the afternoon (9.1) and found to be stable. She worked with therapy on POD #1 and was meeting her goals. Social work arranged for transfer to [facility] and patient was discharged in stable condition later that day.  Diet: Diabetic diet Activity: WBAT Follow-up: in 2 weeks Disposition: Skilled nursing facility Discharged Condition: stable   Discharge Instructions     Call MD / Call 911   Complete by: As directed    If you experience chest pain or shortness of breath, CALL 911 and be transported to the hospital emergency room.  If you develope a fever above 101 F, pus (white drainage) or increased drainage or redness at the wound, or calf pain, call your surgeon's office.   Change dressing   Complete by: As directed    You may remove the bulky bandage (ACE wrap and gauze) two days after surgery. You will have an adhesive waterproof bandage underneath. Leave this in place until your first follow-up appointment.   Constipation Prevention   Complete by: As directed    Drink  plenty of fluids.  Prune juice may be helpful.  You may use a stool softener, such as Colace (over the counter) 100 mg twice a day.  Use MiraLax (over the counter) for constipation as needed.   Diet - low sodium heart healthy   Complete by: As directed    Do not put a pillow under the knee. Place it under the heel.   Complete by: As directed    Driving restrictions   Complete by: As directed    No driving for two weeks   Post-operative opioid taper instructions:   Complete by: As directed    POST-OPERATIVE OPIOID TAPER INSTRUCTIONS: It is important to wean off of your opioid medication as soon as possible. If you do not need pain medication after your surgery it is ok to stop day one. Opioids include: Codeine, Hydrocodone(Norco, Vicodin), Oxycodone(Percocet, oxycontin) and hydromorphone amongst others.  Long term and even short term use of opiods can cause: Increased pain response Dependence Constipation Depression Respiratory depression And more.  Withdrawal symptoms can include Flu like symptoms Nausea, vomiting And more Techniques to manage these symptoms Hydrate well Eat regular healthy meals Stay active Use relaxation techniques(deep breathing, meditating, yoga) Do Not substitute Alcohol to help with tapering If you have been on opioids for less than two weeks and do not have pain than it is ok to stop all together.  Plan to wean off of opioids This plan should start within one week post op of your joint replacement. Maintain the same interval or time between taking each dose and first decrease the dose.  Cut the total daily intake of opioids by one tablet each day Next start to increase the time between doses. The last dose that should be eliminated is the evening dose.      TED hose   Complete by: As directed    Use stockings (TED hose) for three weeks on both leg(s).  You may remove them at night for sleeping.   Weight bearing as tolerated   Complete by: As  directed       Allergies as of 10/20/2021       Reactions   Sulfonamide Derivatives Diarrhea   Cocaine    Other reaction(s): Hallucination Sinus irrigation   Montelukast Sodium    UNKNOWN   Penicillins    Local skin reaction. Tolerated ancef 07/15/2020 Tolerated Cephalosporin Date: 10/20/21.   Pioglitazone    UNKNOWN    Rosiglitazone Maleate    UNKNOWN   Secobarbital Sodium    UNKNOWN   Codeine Rash   Doxycycline Nausea Only   REACTION: GI UPSET        Medication List  STOP taking these medications    cyclobenzaprine 10 MG tablet Commonly known as: FLEXERIL   fluticasone 50 MCG/ACT nasal spray Commonly known as: FLONASE   loratadine 10 MG tablet Commonly known as: CLARITIN   Melatonin 10 MG Tabs   metoprolol tartrate 25 MG tablet Commonly known as: LOPRESSOR   vitamin B-12 1000 MCG tablet Commonly known as: CYANOCOBALAMIN       TAKE these medications    acetaminophen 650 MG CR tablet Commonly known as: TYLENOL Take 650 mg by mouth every 8 (eight) hours as needed for pain.   albuterol 108 (90 Base) MCG/ACT inhaler Commonly known as: Proventil HFA Inhale 2 puffs into the lungs every 6 (six) hours as needed.   HYDROmorphone 2 MG tablet Commonly known as: DILAUDID Take 1-2 tablets (2-4 mg total) by mouth every 4 (four) hours as needed for severe pain or moderate pain.   levothyroxine 150 MCG tablet Commonly known as: Synthroid Take 1 tablet (150 mcg total) by mouth daily before breakfast. What changed: when to take this   Synthroid 137 MCG tablet Generic drug: levothyroxine TAKE 1 TABLET ONCE DAILY ON MONDAY, WEDNESDAY, AND FRIDAY. TAKE 150MCG DAILY ALL OTHER DAYS. What changed:  how much to take how to take this when to take this additional instructions   loperamide 2 MG capsule Commonly known as: IMODIUM Take 2 mg by mouth as needed for diarrhea or loose stools.   losartan 50 MG tablet Commonly known as: COZAAR Take 1 tablet  (50 mg total) by mouth daily.   methocarbamol 500 MG tablet Commonly known as: ROBAXIN Take 1 tablet (500 mg total) by mouth every 6 (six) hours as needed for muscle spasms. Notes to patient: Muscle relaxer   pantoprazole 40 MG tablet Commonly known as: PROTONIX Take 1 tablet (40 mg total) by mouth daily.   Prolia 60 MG/ML Sosy injection Generic drug: denosumab Inject 60 mg into the skin every 6 (six) months. Deliver to [address], [address], [place] [address]   rivaroxaban 10 MG Tabs tablet Commonly known as: XARELTO Take 1 tablet (10 mg total) by mouth daily with breakfast for 20 days. Start taking on: October 21, 2021 Notes to patient: For blood clot prevention   venlafaxine XR 75 MG 24 hr capsule Commonly known as: EFFEXOR-XR TAKE 1 CAPSULE ONCE DAILY WITH BREAKFAST.               Discharge Care Instructions  (From admission, onward)           Start     Ordered   10/20/21 0000  Weight bearing as tolerated        10/20/21 0819   10/20/21 0000  Change dressing       Comments: You may remove the bulky bandage (ACE wrap and gauze) two days after surgery. You will have an adhesive waterproof bandage underneath. Leave this in place until your first follow-up appointment.   10/20/21 2536            Contact information for follow-up providers     Gaynelle Arabian, MD. Go on 11/03/2021.   Specialty: Orthopedic Surgery Why: You are scheduled for a follow up appointment on 1:15 pm. Contact information: [address] [place] [address] [phone]              Contact information for after-discharge care     Destination     [facility] .   Service: Skilled Nursing Contact  information: [place] [phone]                     Signed: Theresa Duty, PA-C Orthopedic Surgery 10/20/2021, 4:57 PM

## 2021-10-20 NOTE — NC FL2 (Signed)
[place] LEVEL OF CARE SCREENING TOOL     IDENTIFICATION  Patient Name: Stacy Ayers Birthdate: 1941/08/21 Sex: female Admission Date (Current Location): 10/19/2021  [facility] and [place] Number:  Herbalist and Address:  [facility],  [place], [place]      Provider Number: [id number]  Attending Physician Name and Address:  Gaynelle Arabian, MD  Relative Name and Phone Number:  Arsenio Katz (daughter) Ph: [phone]    Current Level of Care: Hospital Recommended Level of Care: [place] Prior Approval Number:    Date Approved/Denied:   PASRR Number:    Discharge Plan: SNF    Current Diagnoses: Patient Active Problem List   Diagnosis Date Noted   Primary osteoarthritis of left knee 10/19/2021   Pre-op evaluation 10/12/2021   Depression, major, single episode, mild ([place]) 07/24/2021   Age-related osteoporosis without current pathological fracture 06/25/2021   Diabetes mellitus ([place]) 04/28/2021   DM (diabetes mellitus) type II, controlled, with peripheral vascular disorder ([place]) 04/28/2021   Morbid obesity ([place]) 04/28/2021   Anemia 04/28/2021   Fatigue 03/23/2021   Right ear pain 11/06/2020   Osteoarthritis of right knee 10/01/2020   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   Viral upper respiratory tract infection 11/14/2018   OA (osteoarthritis) of knee 05/12/2018   Dysuria 05/03/2018   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   Seasonal allergies 08/11/2015   Alcohol dependence ([place]) 08/11/2015   CTS (carpal tunnel syndrome) 08/11/2015   Allergic rhinitis 12/19/2013   B12 deficiency 05/22/2013   Left foot pain 09/06/2012   Depression with anxiety 04/05/2012   PERSONAL HISTORY OF COLONIC POLYPS 01/18/2011   SPONTANEOUS ECCHYMOSES 10/13/2010   DIARRHEA, CHRONIC 10/13/2010   PHARYNGITIS, CHRONIC 02/16/2010   Hypothyroidism 01/14/2010   History of bariatric surgery  02/25/2009   Vitamin D deficiency 09/10/2008   VARICOSE VEINS, LOWER EXTREMITIES 09/10/2008   PULMONARY NODULE 08/14/2008   FATIGUE 08/14/2008   MUSCLE PAIN 06/14/2007   BLADDER PAIN 06/14/2007   COLONIC POLYPS 03/14/2007   History of diet-controlled diabetes 03/14/2007   Hyperlipidemia LDL goal <100 03/14/2007   ANEMIA-IRON DEFICIENCY 03/14/2007   Essential hypertension 03/14/2007   VOCAL CORD PARALYSIS 03/14/2007   EDEMA, LARYNX 03/14/2007   ASTHMA 03/14/2007   Esophageal reflux 03/14/2007   LIVER FUNCTION TESTS, ABNORMAL, HX OF 03/14/2007   TONSILLECTOMY AND ADENOIDECTOMY, HX OF 03/14/2007    Orientation RESPIRATION BLADDER Height & Weight     Self, Time, Situation, Place  Normal Continent Weight: 173 lb 3.2 oz (78.6 kg) Height:  5' 7.5" (171.5 cm)  BEHAVIORAL SYMPTOMS/MOOD NEUROLOGICAL BOWEL NUTRITION STATUS   (N/A)   Continent Diet (Regular diet)  AMBULATORY STATUS COMMUNICATION OF NEEDS Skin   Limited Assist Verbally Surgical wounds                       Personal Care Assistance Level of Assistance  Bathing, Feeding, Dressing Bathing Assistance: Limited assistance Feeding assistance: Independent Dressing Assistance: Limited assistance     Functional Limitations Info  Sight, Hearing, Speech Sight Info: Adequate Hearing Info: Adequate Speech Info: Adequate    SPECIAL CARE FACTORS FREQUENCY  PT (By licensed PT), OT (By licensed OT)     PT Frequency: 5x's/week OT Frequency: 5x's/week            Contractures Contractures Info: Not present    Additional Factors Info  Code Status, Allergies, Psychotropic Code Status Info: Full  Allergies Info: Sulfonamide Derivatives, Cocaine, Montelukast Sodium, Penicillins, Pioglitazone, Rosiglitazone Maleate, Secobarbital Sodium, Codeine, Doxycycline Psychotropic Info: Effexor         Current Medications (10/20/2021):  This is the current hospital active medication list Current Facility-Administered Medications   Medication Dose Route Frequency Provider Last Rate Last Admin   0.9 %  sodium chloride infusion   Intravenous Continuous Edmisten, Kristie L, PA 75 mL/hr at 10/19/21 2324 New Bag at 10/19/21 2324   albuterol (PROVENTIL) (2.5 MG/3ML) 0.083% nebulizer solution 3 mL  3 mL Inhalation Q6H PRN Edmisten, Kristie L, PA       bisacodyl (DULCOLAX) suppository 10 mg  10 mg Rectal Daily PRN Edmisten, Kristie L, PA       diphenhydrAMINE (BENADRYL) 12.5 MG/5ML elixir 12.5-25 mg  12.5-25 mg Oral Q4H PRN Edmisten, Kristie L, PA       docusate sodium (COLACE) capsule 100 mg  100 mg Oral BID Edmisten, Kristie L, PA   100 mg at 10/20/21 0817   fluticasone (FLONASE) 50 MCG/ACT nasal spray 2 spray  2 spray Each Nare Daily PRN Edmisten, Kristie L, PA       HYDROmorphone (DILAUDID) tablet 2-4 mg  2-4 mg Oral Q4H PRN Edmisten, Kristie L, PA   4 mg at 10/20/21 1028   levothyroxine (SYNTHROID) tablet 137 mcg  137 mcg Oral Q T,Th,S,Su Edmisten, Kristie L, PA   137 mcg at 10/20/21 1941   levothyroxine (SYNTHROID) tablet 150 mcg  150 mcg Oral Q M,W,F Edmisten, Kristie L, PA       loratadine (CLARITIN) tablet 10 mg  10 mg Oral Daily PRN Edmisten, Kristie L, PA       losartan (COZAAR) tablet 50 mg  50 mg Oral Daily Edmisten, Kristie L, PA   50 mg at 10/20/21 7408   menthol-cetylpyridinium (CEPACOL) lozenge 3 mg  1 lozenge Oral PRN Edmisten, Kristie L, PA       Or   phenol (CHLORASEPTIC) mouth spray 1 spray  1 spray Mouth/Throat PRN Edmisten, Kristie L, PA       methocarbamol (ROBAXIN) tablet 500 mg  500 mg Oral Q6H PRN Edmisten, Kristie L, PA   500 mg at 10/20/21 1029   Or   methocarbamol (ROBAXIN) 500 mg in dextrose 5 % 50 mL IVPB  500 mg Intravenous Q6H PRN Edmisten, Kristie L, PA       metoCLOPramide (REGLAN) tablet 5-10 mg  5-10 mg Oral Q8H PRN Edmisten, Kristie L, PA       Or   metoCLOPramide (REGLAN) injection 5-10 mg  5-10 mg Intravenous Q8H PRN Edmisten, Kristie L, PA       morphine 2 MG/ML injection 0.5-1 mg  0.5-1  mg Intravenous Q2H PRN Edmisten, Kristie L, PA   1 mg at 10/19/21 2055   ondansetron (ZOFRAN) tablet 4 mg  4 mg Oral Q6H PRN Edmisten, Kristie L, PA       Or   ondansetron (ZOFRAN) injection 4 mg  4 mg Intravenous Q6H PRN Edmisten, Kristie L, PA   4 mg at 10/19/21 1557   pantoprazole (PROTONIX) EC tablet 40 mg  40 mg Oral Daily Edmisten, Kristie L, PA   40 mg at 10/20/21 0817   polyethylene glycol (MIRALAX / GLYCOLAX) packet 17 g  17 g Oral Daily PRN Edmisten, Kristie L, PA       rivaroxaban (XARELTO) tablet 10 mg  10 mg Oral Q breakfast Edmisten, Kristie L, PA   10 mg at 10/20/21 [phone]  sodium phosphate (FLEET) 7-19 GM/118ML enema 1 enema  1 enema Rectal Once PRN Edmisten, Kristie L, PA       venlafaxine XR (EFFEXOR-XR) 24 hr capsule 75 mg  75 mg Oral Q breakfast Edmisten, Kristie L, PA   75 mg at 10/20/21 1505     Discharge Medications: Please see discharge summary for a list of discharge medications.  Relevant Imaging Results:  Relevant Lab Results:   Additional Information SSN: [id number]  Sherie Don, LCSW

## 2021-10-20 NOTE — TOC Transition Note (Signed)
Transition of Care [facility]) - CM/SW Discharge Note  Patient Details  Name: Stacy Ayers MRN: [id number] Date of Birth: 1941/08/24  [facility]) CM/SW Contact:  Sherie Don, LCSW Phone Number: 10/20/2021, 5:04 PM  Clinical Narrative: Patient is from [facility] independent living and the discharge plan is to go to [facility] for short-term rehab. CSW confirmed with Butch Penny at [facility] that patient has a bed available today pending discharge summary and negative COVID test.  FL2 done. COVID test is negative. Discharge summary, discharge orders, SNF transfer report, and FL2 faxed to facility in hub. Patient aware she is discharging today and daughter will provide transportation to the facility. The number for report is [phone]. RN updated. TOC signing off.  Final next level of care: Skilled Nursing Facility Barriers to Discharge: No Barriers Identified  Patient Goals and CMS Choice Patient states their goals for this hospitalization and ongoing recovery are:: Discharge to [facility] SNF CMS Medicare.gov Compare Post Acute Care list provided to:: Patient Choice offered to / list presented to : Patient  Discharge Placement      Patient chooses bed at: [facility] Patient to be transferred to facility by: Daughter Patient and family notified of of transfer: 10/20/21  Discharge Plan and Services        DME Arranged: N/A DME Agency: NA HH Arranged: NA [place] Agency: NA  Readmission Risk Interventions No flowsheet data found.

## 2021-10-20 NOTE — Progress Notes (Signed)
Subjective: 1 Day Post-Op Procedure(s) (LRB): TOTAL KNEE ARTHROPLASTY (Left) Patient reports pain as mild.   Patient seen in rounds by Dr. Wynelle Link. Patient having increased pain this AM, consistent with block wearing off. Denies chest pain or SOB. No issues overnight, foley catheter removed this AM. We will continue therapy today.   Objective: Vital signs in last 24 hours: Temp:  [97.6 F (36.4 C)-99.6 F (37.6 C)] 98.9 F (37.2 C) (11/22 0505) Pulse Rate:  [53-84] 76 (11/22 0505) Resp:  [13-21] 16 (11/22 0505) BP: (123-150)/(52-75) 136/60 (11/22 0505) SpO2:  [96 %-100 %] 99 % (11/22 0505)  Intake/Output from previous day:  Intake/Output Summary (Last 24 hours) at 10/20/2021 0812 Last data filed at 10/20/2021 0634 Gross per 24 hour  Intake 2185.06 ml  Output 2750 ml  Net -564.94 ml     Intake/Output this shift: No intake/output data recorded.  Labs: Recent Labs    10/20/21 0309  HGB 8.6*   Recent Labs    10/20/21 0309  WBC 7.9  RBC 2.83*  HCT 26.6*  PLT 143*   Recent Labs    10/20/21 0309  NA 136  K 3.9  CL 105  CO2 26  BUN 9  CREATININE 0.59  GLUCOSE 127*  CALCIUM 8.0*   No results for input(s): LABPT, INR in the last 72 hours.  Exam: General - Patient is Alert and Oriented Extremity - Neurologically intact Neurovascular intact Sensation intact distally Dorsiflexion/Plantar flexion intact Dressing - dressing C/D/I Motor Function - intact, moving foot and toes well on exam.   Past Medical History:  Diagnosis Date   Abnormal liver function tests    Allergy    Anxiety    Arthritis    knees, lower back    Asthma    Cancer ([place])    Cataract    left eye growing cataract    Depression    Diabetes mellitus    had gastric bypass DM resolved-    Diverticulosis    GERD (gastroesophageal reflux disease)    Headache    Previous migraines   Heart murmur    History of colon polyps    adenomatous   Hyperlipidemia    past hx- resloved  after gastric bypass    Hypertension    Hypothyroidism    IBS (irritable bowel syndrome)    Iron deficiency anemia    Osteoporosis     Assessment/Plan: 1 Day Post-Op Procedure(s) (LRB): TOTAL KNEE ARTHROPLASTY (Left) Principal Problem:   OA (osteoarthritis) of knee Active Problems:   Primary osteoarthritis of left knee  Estimated body mass index is 26.73 kg/m as calculated from the following:   Height as of this encounter: 5' 7.5" (1.715 m).   Weight as of this encounter: 78.6 kg. Advance diet Up with therapy D/C IV fluids   Patient's anticipated LOS is less than 2 midnights, meeting these requirements: - Younger than 84 - Lives within 1 hour of care - Has a competent adult at home to recover with post-op recover - NO history of  - Chronic pain requiring opioids  - Coronary Artery Disease  - Heart failure  - Heart attack  - Stroke  - DVT/VTE  - Cardiac arrhythmia  - Respiratory Failure/COPD  - Renal failure  - Anemia  - Advanced Liver disease  DVT Prophylaxis - Xarelto Weight bearing as tolerated. Continue therapy.  Consult to social work placed for SNF discharge needs.  Hemoglobin 8.6 this AM, will recheck at 1300 this afternoon to ensure  this is stable prior to discharge.   Plan is to go to [facility] after hospital stay. Possible discharge this afternoon if social work able to make arrangements and hemoglobin stable. Follow-up in the office in 2 weeks  The PDMP database was reviewed today prior to any opioid medications being prescribed to this patient.  Theresa Duty, PA-C Orthopedic Surgery [phone] 10/20/2021, 8:12 AM

## 2021-10-20 NOTE — Progress Notes (Signed)
   10/20/21 1400  PT Visit Information  Last PT Received On 10/20/21  Assistance Needed +1 Pt progressing well. Planning for d/c to [facility] SNF later today.   History of Present Illness 80 yo female s/p L TKA. PMH: R rTSA, DM, HTN, depression  Subjective Data  Patient Stated Goal have less pain  Precautions  Precautions Fall;Knee  Required Braces or Orthoses Knee Immobilizer - Left  Knee Immobilizer - Left Discontinue once straight leg raise with < 10 degree lag  Restrictions  LLE Weight Bearing WBAT  Pain Assessment  Pain Assessment 0-10  Pain Score 3  Pain Location L knee  Pain Descriptors / Indicators Aching;Grimacing;Sore  Pain Intervention(s) Monitored during session;Limited activity within patient's tolerance; ice   Cognition  Arousal/Alertness Awake/alert  Behavior During Therapy WFL for tasks assessed/performed  Overall Cognitive Status Within Functional Limits for tasks assessed  Bed Mobility  Overal bed mobility Needs Assistance  Bed Mobility Sit to Supine  Sit to supine Min assist  General bed mobility comments cues to self assist , min/guard to lift LLE on to bed using gait belt to self assist  Transfers  Overall transfer level Needs assistance  Equipment used Rolling walker (2 wheels)  Transfers Sit to/from Stand  Sit to Stand Min guard;Min assist  General transfer comment cues for hand placement, LLE management and to control descent  Ambulation/Gait  Ambulation/Gait assistance Min assist  Gait Distance (Feet) 15 Feet (x2)  Assistive device Rolling walker (2 wheels)  Gait Pattern/deviations Step-to pattern;Decreased stance time - left  General Gait Details cues for sequence and RW position  Total Joint Exercises  Ankle Circles/Pumps AROM;Both;10 reps  PT - End of Session  Equipment Utilized During Treatment Gait belt  Activity Tolerance Patient tolerated treatment well  Patient left in chair;with call bell/phone within reach;with chair alarm set  Nurse  Communication Mobility status   PT - Assessment/Plan  PT Plan Current plan remains appropriate  PT Visit Diagnosis Other abnormalities of gait and mobility (R26.89)  PT Frequency (ACUTE ONLY) 7X/week  Follow Up Recommendations Follow physician's recommendations for discharge plan and follow up therapies (SNF at [facility])  Assistance recommended at discharge Frequent or constant Supervision/Assistance  PT equipment Rolling walker (2 wheels)  AM-PAC PT "6 Clicks" Mobility Outcome Measure (Version 2)  Help needed turning from your back to your side while in a flat bed without using bedrails? 3  Help needed moving from lying on your back to sitting on the side of a flat bed without using bedrails? 3  Help needed moving to and from a bed to a chair (including a wheelchair)? 3  Help needed standing up from a chair using your arms (e.g., wheelchair or bedside chair)? 3  Help needed to walk in hospital room? 3  Help needed climbing 3-5 steps with a railing?  2  6 Click Score 17  Consider Recommendation of Discharge To: Home with [facility]  PT Goal Progression  Progress towards PT goals Progressing toward goals  Acute Rehab PT Goals  PT Goal Formulation With patient  Time For Goal Achievement 10/26/21  Potential to Achieve Goals Good  PT Time Calculation  PT Start Time (ACUTE ONLY) 1351  PT Stop Time (ACUTE ONLY) 1415  PT Time Calculation (min) (ACUTE ONLY) 24 min  PT General Charges  $$ ACUTE PT VISIT 1 Visit  PT Treatments  $Gait Training 8-22 mins  $Therapeutic Activity 8-22 mins

## 2021-10-21 DIAGNOSIS — M1712 Unilateral primary osteoarthritis, left knee: Secondary | ICD-10-CM | POA: Diagnosis not present

## 2021-10-21 DIAGNOSIS — R278 Other lack of coordination: Secondary | ICD-10-CM | POA: Diagnosis not present

## 2021-10-21 DIAGNOSIS — R2689 Other abnormalities of gait and mobility: Secondary | ICD-10-CM | POA: Diagnosis not present

## 2021-10-21 DIAGNOSIS — M25562 Pain in left knee: Secondary | ICD-10-CM | POA: Diagnosis not present

## 2021-10-21 DIAGNOSIS — M62562 Muscle wasting and atrophy, not elsewhere classified, left lower leg: Secondary | ICD-10-CM | POA: Diagnosis not present

## 2021-10-21 DIAGNOSIS — Z471 Aftercare following joint replacement surgery: Secondary | ICD-10-CM | POA: Diagnosis not present

## 2021-10-23 DIAGNOSIS — R509 Fever, unspecified: Secondary | ICD-10-CM | POA: Diagnosis not present

## 2021-10-23 LAB — COMPREHENSIVE METABOLIC PANEL: Calcium: 8.6 — AB (ref 8.7–10.7)

## 2021-10-23 LAB — CBC AND DIFFERENTIAL
HCT: 24 — AB (ref 36–46)
Hemoglobin: 7.9 — AB (ref 12.0–16.0)
Platelets: 266 (ref 150–399)
WBC: 8.6

## 2021-10-23 LAB — CBC: RBC: 2.64 — AB (ref 3.87–5.11)

## 2021-10-23 LAB — BASIC METABOLIC PANEL
BUN: 11 (ref 4–21)
CO2: 29 — AB (ref 13–22)
Chloride: 99 (ref 99–108)
Creatinine: 0.6 (ref 0.5–1.1)
Glucose: 101
Potassium: 3.5 (ref 3.4–5.3)
Sodium: 135 — AB (ref 137–147)

## 2021-10-24 ENCOUNTER — Other Ambulatory Visit: Payer: Self-pay | Admitting: Internal Medicine

## 2021-10-24 MED ORDER — HYDROMORPHONE HCL 2 MG PO TABS: 2.0000 mg | ORAL_TABLET | ORAL | 0 refills | Status: DC | PRN

## 2021-10-24 NOTE — Progress Notes (Signed)
Dilaudid renewed 

## 2021-10-26 ENCOUNTER — Non-Acute Institutional Stay (SKILLED_NURSING_FACILITY): Payer: Medicare PPO | Admitting: Internal Medicine

## 2021-10-26 ENCOUNTER — Encounter: Payer: Self-pay | Admitting: Internal Medicine

## 2021-10-26 DIAGNOSIS — E039 Hypothyroidism, unspecified: Secondary | ICD-10-CM | POA: Diagnosis not present

## 2021-10-26 DIAGNOSIS — Z96652 Presence of left artificial knee joint: Secondary | ICD-10-CM | POA: Diagnosis not present

## 2021-10-26 DIAGNOSIS — M25562 Pain in left knee: Secondary | ICD-10-CM | POA: Diagnosis not present

## 2021-10-26 DIAGNOSIS — K5901 Slow transit constipation: Secondary | ICD-10-CM

## 2021-10-26 DIAGNOSIS — D649 Anemia, unspecified: Secondary | ICD-10-CM

## 2021-10-26 DIAGNOSIS — E538 Deficiency of other specified B group vitamins: Secondary | ICD-10-CM | POA: Diagnosis not present

## 2021-10-26 DIAGNOSIS — L03116 Cellulitis of left lower limb: Secondary | ICD-10-CM

## 2021-10-26 DIAGNOSIS — M62562 Muscle wasting and atrophy, not elsewhere classified, left lower leg: Secondary | ICD-10-CM | POA: Diagnosis not present

## 2021-10-26 DIAGNOSIS — R2689 Other abnormalities of gait and mobility: Secondary | ICD-10-CM | POA: Diagnosis not present

## 2021-10-26 DIAGNOSIS — E785 Hyperlipidemia, unspecified: Secondary | ICD-10-CM | POA: Diagnosis not present

## 2021-10-26 DIAGNOSIS — R278 Other lack of coordination: Secondary | ICD-10-CM | POA: Diagnosis not present

## 2021-10-26 DIAGNOSIS — Z471 Aftercare following joint replacement surgery: Secondary | ICD-10-CM | POA: Diagnosis not present

## 2021-10-26 DIAGNOSIS — M1712 Unilateral primary osteoarthritis, left knee: Secondary | ICD-10-CM | POA: Diagnosis not present

## 2021-10-26 DIAGNOSIS — F418 Other specified anxiety disorders: Secondary | ICD-10-CM | POA: Diagnosis not present

## 2021-10-26 NOTE — Progress Notes (Signed)
Provider:  Veleta Miners MD Location:   [place] Room Number: 158 Place of Service:  SNF (31)  PCP: [name], DO Patient Care Team: [name], DO as PCP - Gaston Islam, MD as Consulting Physician (Orthopedic Surgery) Marica Otter, [place] as Consulting Physician (Optometry) Sharyn Lull Mottinger as Consulting Physician (Dentistry) Marica Otter, OD [facility])  Extended Emergency Contact Information Primary Emergency Contact: [facility] Address: [address]          [place], [place] [address] Johnnette Litter of [place] Phone: [phone] Relation: Daughter  Code Status: Full Code Goals of Care: Advanced Directive information Advanced Directives 10/26/2021  Does Patient Have a Medical Advance Directive? Yes  Type of Paramedic of [place];Living will  Does patient want to make changes to medical advance directive? No - Patient declined  Copy of [place] in Chart? No - copy requested  Would patient like information on creating a medical advance directive? -      Chief Complaint  Patient presents with   New Admit To SNF    Admission to SNF    HPI: Patient is a 80 y.o. female seen today for admission to SNF after undergoing Left TKA  She has h/o Osteoporosis,Arthirits and Deprssion Admitted to SNF for therapy after undergoing left TKA on 11/21 by Dr. Maureen Ralphs  Since been here patient's main complaint has been severe pain in her knee Continues to need Dilaudid every few hours.  Also noticed to have some redness around the joint with the bruise below it.   Also had a low-grade temp Patient also had a CBC done at the weekend and her hemoglobin was 7.9 down from 8.6 but white count was in good levels Also having issues with constipation has not moved bowels for the last 7 days though denies any nausea or abdominal pain is moving gas.  Did get some MiraLAX  today   Past Medical History:  Diagnosis Date   Abnormal liver function tests    Allergy    Anxiety    Arthritis    knees, lower back    Asthma    Cancer ([place])    Cataract    left eye growing cataract    Depression    Diabetes mellitus    had gastric bypass DM resolved-    Diverticulosis    GERD (gastroesophageal reflux disease)    Headache    Previous migraines   Heart murmur    History of colon polyps    adenomatous   Hyperlipidemia    past hx- resloved after gastric bypass    Hypertension    Hypothyroidism    IBS (irritable bowel syndrome)    Iron deficiency anemia    Osteoporosis    Past Surgical History:  Procedure Laterality Date   ABDOMINAL HYSTERECTOMY     ABDOMINAL HYSTERECTOMY     APPENDECTOMY     BLADDER REPAIR     BREAST BIOPSY Right    needle core biopsy, benign   CATARACT EXTRACTION Left    CHOLECYSTECTOMY     COLONOSCOPY     CORONARY ANGIOPLASTY     GASTRIC BYPASS     POLYPECTOMY     REVERSE SHOULDER ARTHROPLASTY Right 07/15/2020   Procedure: REVERSE SHOULDER ARTHROPLASTY;  Surgeon: Justice Britain, MD;  Location: [facility];  Service: Orthopedics;  Laterality: Right;  178min   TONSILLECTOMY AND ADENOIDECTOMY     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  Procedure: TOTAL KNEE ARTHROPLASTY;  Surgeon: Gaynelle Arabian, MD;  Location: [facility];  Service: Orthopedics;  Laterality: Left;    reports that she quit smoking about 59 years ago. Her smoking use included cigarettes. She has a 0.60 pack-year smoking history. She has never used smokeless tobacco. She reports current alcohol use. She reports that she does not use drugs. Social History   Socioeconomic History   Marital status: Single    Spouse name: Not on file   Number of children: 1   Years of education: Not on file   Highest education level: Not on file  Occupational History   Occupation: retired Product manager: RETIRED  Tobacco Use   Smoking status: Former    Packs/day: 0.30    Years:  2.00    Pack years: 0.60    Types: Cigarettes    Quit date: 07/11/1962    Years since quitting: 59.3   Smokeless tobacco: Never  Vaping Use   Vaping Use: Never used  Substance and Sexual Activity   Alcohol use: Yes   Drug use: No   Sexual activity: Yes    Birth control/protection: Surgical    Comment: Hysterectomy  Other Topics Concern   Not on file  Social History Narrative   Daily caffeine   Social Determinants of Health   Financial Resource Strain: Not on file  Food Insecurity: Not on file  Transportation Needs: Not on file  Physical Activity: Not on file  Stress: Not on file  Social Connections: Not on file  Intimate Partner Violence: Not on file    Functional Status Survey:    Family History  Problem Relation Age of Onset   Stroke Mother    Asthma Mother    Anuerysm Mother    Obesity Father    Diabetes Sister    Heart disease Maternal Grandmother    Angina Maternal Grandmother    Suicidality Maternal Grandfather    Cancer Paternal Grandmother    Colon cancer Neg Hx    Colon polyps Neg Hx    Esophageal cancer Neg Hx    Rectal cancer Neg Hx    Stomach cancer Neg Hx    Pancreatic cancer Neg Hx    Liver disease Neg Hx     Health Maintenance  Topic Date Due   Hepatitis C Screening  Never done   OPHTHALMOLOGY EXAM  07/21/2018   FOOT EXAM  08/05/2018   Zoster Vaccines- Shingrix (2 of 2) 11/02/2019   COVID-19 Vaccine (5 - Booster for Moderna series) 04/28/2021   TETANUS/TDAP  07/11/2021   HEMOGLOBIN A1C  02/14/2022   Pneumonia Vaccine 78+ Years old  Completed   INFLUENZA VACCINE  Completed   DEXA SCAN  Completed   HPV VACCINES  Aged Out   COLONOSCOPY (Pts 45-10yrs Insurance coverage will need to be confirmed)  Discontinued    Allergies  Allergen Reactions   Sulfonamide Derivatives Diarrhea   Cocaine     Other reaction(s): Hallucination Sinus irrigation   Montelukast Sodium     UNKNOWN   Penicillins     Local skin reaction.  Tolerated ancef  07/15/2020 Tolerated Cephalosporin Date: 10/20/21.     Pioglitazone     UNKNOWN    Rosiglitazone Maleate     UNKNOWN   Secobarbital Sodium     UNKNOWN   Codeine Rash   Doxycycline Nausea Only    REACTION: GI UPSET    Allergies as of 10/26/2021       Reactions  Sulfonamide Derivatives Diarrhea   Cocaine    Other reaction(s): Hallucination Sinus irrigation   Montelukast Sodium    UNKNOWN   Penicillins    Local skin reaction. Tolerated ancef 07/15/2020 Tolerated Cephalosporin Date: 10/20/21.   Pioglitazone    UNKNOWN    Rosiglitazone Maleate    UNKNOWN   Secobarbital Sodium    UNKNOWN   Codeine Rash   Doxycycline Nausea Only   REACTION: GI UPSET        Medication List        Accurate as of October 26, 2021  9:44 AM. If you have any questions, ask your nurse or doctor.          acetaminophen 650 MG CR tablet Commonly known as: TYLENOL Take 650 mg by mouth every 8 (eight) hours as needed for pain.   albuterol 108 (90 Base) MCG/ACT inhaler Commonly known as: Proventil HFA Inhale 2 puffs into the lungs every 6 (six) hours as needed.   docusate sodium 100 MG capsule Commonly known as: COLACE Take 100 mg by mouth 2 (two) times daily.   ferrous sulfate 325 (65 FE) MG tablet Take 325 mg by mouth daily with breakfast.   HYDROmorphone 2 MG tablet Commonly known as: DILAUDID Take 1-2 tablets (2-4 mg total) by mouth every 4 (four) hours as needed for severe pain or moderate pain.   levothyroxine 150 MCG tablet Commonly known as: Synthroid Take 1 tablet (150 mcg total) by mouth daily before breakfast. What changed: when to take this   Synthroid 137 MCG tablet Generic drug: levothyroxine TAKE 1 TABLET ONCE DAILY ON MONDAY, WEDNESDAY, AND FRIDAY. TAKE 150MCG DAILY ALL OTHER DAYS. What changed:  how much to take how to take this when to take this additional instructions   loperamide 2 MG capsule Commonly known as: IMODIUM Take 2 mg by mouth as needed  for diarrhea or loose stools.   losartan 50 MG tablet Commonly known as: COZAAR Take 1 tablet (50 mg total) by mouth daily.   methocarbamol 500 MG tablet Commonly known as: ROBAXIN Take 1 tablet (500 mg total) by mouth every 6 (six) hours as needed for muscle spasms.   pantoprazole 40 MG tablet Commonly known as: PROTONIX Take 1 tablet (40 mg total) by mouth daily.   Prolia 60 MG/ML Sosy injection Generic drug: denosumab Inject 60 mg into the skin every 6 (six) months. Deliver to [address], [place] [address]   rivaroxaban 10 MG Tabs tablet Commonly known as: XARELTO Take 1 tablet (10 mg total) by mouth daily with breakfast for 20 days.   venlafaxine XR 75 MG 24 hr capsule Commonly known as: EFFEXOR-XR TAKE 1 CAPSULE ONCE DAILY WITH BREAKFAST.        Review of Systems  Constitutional:  Positive for activity change.  HENT: Negative.    Respiratory: Negative.    Cardiovascular: Negative.   Gastrointestinal:  Positive for constipation.  Genitourinary: Negative.   Musculoskeletal:  Positive for arthralgias, gait problem and myalgias.  Skin: Negative.   Neurological:  Negative for dizziness.  Psychiatric/Behavioral: Negative.     Vitals:   10/26/21 0910  BP: (!) 152/76  Pulse: 88  Resp: 20  Temp: 97.7 F (36.5 C)  SpO2: 97%  Weight: 174 lb (78.9 kg)  Height: 5\' 7"  (1.702 m)   Body mass index is 27.25 kg/m. Physical Exam Constitutional: Oriented to person, place, and time. Well-developed and well-nourished.  HENT:  Head: Normocephalic.  Mouth/Throat: Oropharynx is clear  and moist.  Eyes: Pupils are equal, round, and reactive to light.  Neck: Neck supple.  Cardiovascular: Normal rate and normal heart sounds.  No murmur heard. Pulmonary/Chest: Effort normal and breath sounds normal. No respiratory distress. No wheezes. She has no rales.  Abdominal: Soft. Bowel sounds are normal. No distension. There is no tenderness. There is no rebound.   Musculoskeletal: Left Knee Has redness around the Dressing Warm and a small bruise below it. C/o Pain and Muscle spasms Lymphadenopathy: none Neurological: Alert and oriented to person, place, and time.  Skin: Skin is warm and dry.  Psychiatric: Normal mood and affect. Behavior is normal. Thought content normal.   Labs reviewed: Basic Metabolic Panel: Recent Labs    04/28/21 1407 06/25/21 1025 10/09/21 1118 10/20/21 0309 10/23/21 0000  NA 137   < > 139 136 135*  K 4.2   < > 4.7 3.9 3.5  CL 103   < > 106 105 99  CO2 27   < > 28 26 29*  GLUCOSE 95  --  122* 127*  --   BUN 11   < > 9 9 11   CREATININE 0.66   < > 0.61 0.59 0.6  CALCIUM 9.5   < > 9.2 8.0* 8.6*   < > = values in this interval not displayed.   Liver Function Tests: Recent Labs    03/23/21 1028 04/28/21 1407 08/17/21 0000 10/09/21 1118  AST 22 20 27 24   ALT 20 20 24 20   ALKPHOS 62 61 72 48  BILITOT 0.4 0.4  --  0.8  PROT 6.2 6.1  --  7.0  ALBUMIN 3.8 4.0 4.1 4.2   No results for input(s): LIPASE, AMYLASE in the last 8760 hours. No results for input(s): AMMONIA in the last 8760 hours. CBC: Recent Labs    03/23/21 1028 04/28/21 1407 10/09/21 1118 10/20/21 0309 10/20/21 1257 10/23/21 0000  WBC 3.8* 5.0 4.0 7.9 9.5 8.6  NEUTROABS 2.1 3.3  --   --   --   --   HGB 10.3* 11.2* 11.2* 8.6* 9.1* 7.9*  HCT 31.4* 33.4* 35.0* 26.6* 28.0* 24*  MCV 89.9 91.7 94.3 94.0 94.3  --   PLT 191.0 186.0 217 143* 140* 266   Cardiac Enzymes: No results for input(s): CKTOTAL, CKMB, CKMBINDEX, TROPONINI in the last 8760 hours. BNP: Invalid input(s): POCBNP Lab Results  Component Value Date   HGBA1C 5.5 08/17/2021   Lab Results  Component Value Date   TSH 0.38 (A) 08/17/2021   Lab Results  Component Value Date   VITAMINB12 1,066 (H) 03/23/2021   No results found for: FOLATE Lab Results  Component Value Date   IRON 41 (L) 04/28/2021   TIBC 421 03/21/2006   FERRITIN 11.2 04/28/2021    Imaging and Procedures  obtained prior to SNF admission: No results found.  Assessment/Plan History of total left knee replacement C/o Severe Pain On Xarelto  WBAT Dilaudid and  Robaxin for pain Cellulitis of Left knee Keflex 250 QID for 7 days  Anemia, unspecified type Started on Iron  Repeat CBC  Slow transit constipation Miralax prn  Hypothyroidism, unspecified type TSH low Managed by PCP Hypertension On Losartan Depression Continue Effexor Osteoporoiss On prolia   Family/ staff Communication:   Labs/tests ordered:CBC

## 2021-10-27 DIAGNOSIS — R278 Other lack of coordination: Secondary | ICD-10-CM | POA: Diagnosis not present

## 2021-10-27 DIAGNOSIS — R2689 Other abnormalities of gait and mobility: Secondary | ICD-10-CM | POA: Diagnosis not present

## 2021-10-27 DIAGNOSIS — Z471 Aftercare following joint replacement surgery: Secondary | ICD-10-CM | POA: Diagnosis not present

## 2021-10-27 DIAGNOSIS — M25562 Pain in left knee: Secondary | ICD-10-CM | POA: Diagnosis not present

## 2021-10-27 DIAGNOSIS — M62562 Muscle wasting and atrophy, not elsewhere classified, left lower leg: Secondary | ICD-10-CM | POA: Diagnosis not present

## 2021-10-27 DIAGNOSIS — M1712 Unilateral primary osteoarthritis, left knee: Secondary | ICD-10-CM | POA: Diagnosis not present

## 2021-10-28 ENCOUNTER — Emergency Department (HOSPITAL_COMMUNITY): Payer: Medicare PPO

## 2021-10-28 ENCOUNTER — Emergency Department (HOSPITAL_BASED_OUTPATIENT_CLINIC_OR_DEPARTMENT_OTHER): Payer: Medicare PPO

## 2021-10-28 ENCOUNTER — Other Ambulatory Visit: Payer: Self-pay

## 2021-10-28 ENCOUNTER — Encounter (HOSPITAL_COMMUNITY): Payer: Self-pay

## 2021-10-28 ENCOUNTER — Observation Stay (HOSPITAL_COMMUNITY)
Admission: EM | Admit: 2021-10-28 | Discharge: 2021-10-30 | Disposition: A | Discharge status: Skilled Nursing Facility | Payer: Medicare PPO | Attending: Internal Medicine | Admitting: Internal Medicine

## 2021-10-28 DIAGNOSIS — Z96611 Presence of right artificial shoulder joint: Secondary | ICD-10-CM | POA: Diagnosis not present

## 2021-10-28 DIAGNOSIS — Z79899 Other long term (current) drug therapy: Secondary | ICD-10-CM | POA: Diagnosis not present

## 2021-10-28 DIAGNOSIS — R1084 Generalized abdominal pain: Secondary | ICD-10-CM

## 2021-10-28 DIAGNOSIS — K439 Ventral hernia without obstruction or gangrene: Secondary | ICD-10-CM | POA: Diagnosis not present

## 2021-10-28 DIAGNOSIS — M25562 Pain in left knee: Secondary | ICD-10-CM | POA: Diagnosis not present

## 2021-10-28 DIAGNOSIS — L8990 Pressure ulcer of unspecified site, unspecified stage: Secondary | ICD-10-CM | POA: Insufficient documentation

## 2021-10-28 DIAGNOSIS — E872 Acidosis, unspecified: Secondary | ICD-10-CM | POA: Diagnosis not present

## 2021-10-28 DIAGNOSIS — Z8639 Personal history of other endocrine, nutritional and metabolic disease: Secondary | ICD-10-CM

## 2021-10-28 DIAGNOSIS — Z96652 Presence of left artificial knee joint: Secondary | ICD-10-CM | POA: Insufficient documentation

## 2021-10-28 DIAGNOSIS — F320 Major depressive disorder, single episode, mild: Secondary | ICD-10-CM | POA: Diagnosis not present

## 2021-10-28 DIAGNOSIS — Z20822 Contact with and (suspected) exposure to covid-19: Secondary | ICD-10-CM | POA: Insufficient documentation

## 2021-10-28 DIAGNOSIS — E890 Postprocedural hypothyroidism: Secondary | ICD-10-CM | POA: Diagnosis not present

## 2021-10-28 DIAGNOSIS — D696 Thrombocytopenia, unspecified: Secondary | ICD-10-CM | POA: Insufficient documentation

## 2021-10-28 DIAGNOSIS — E119 Type 2 diabetes mellitus without complications: Secondary | ICD-10-CM | POA: Insufficient documentation

## 2021-10-28 DIAGNOSIS — A419 Sepsis, unspecified organism: Secondary | ICD-10-CM | POA: Diagnosis not present

## 2021-10-28 DIAGNOSIS — E039 Hypothyroidism, unspecified: Secondary | ICD-10-CM | POA: Diagnosis not present

## 2021-10-28 DIAGNOSIS — K56609 Unspecified intestinal obstruction, unspecified as to partial versus complete obstruction: Secondary | ICD-10-CM | POA: Diagnosis not present

## 2021-10-28 DIAGNOSIS — M7989 Other specified soft tissue disorders: Secondary | ICD-10-CM

## 2021-10-28 DIAGNOSIS — D649 Anemia, unspecified: Secondary | ICD-10-CM | POA: Insufficient documentation

## 2021-10-28 DIAGNOSIS — N281 Cyst of kidney, acquired: Secondary | ICD-10-CM | POA: Diagnosis not present

## 2021-10-28 DIAGNOSIS — J45909 Unspecified asthma, uncomplicated: Secondary | ICD-10-CM | POA: Insufficient documentation

## 2021-10-28 DIAGNOSIS — M79605 Pain in left leg: Secondary | ICD-10-CM | POA: Diagnosis not present

## 2021-10-28 DIAGNOSIS — K449 Diaphragmatic hernia without obstruction or gangrene: Secondary | ICD-10-CM | POA: Diagnosis not present

## 2021-10-28 DIAGNOSIS — E1151 Type 2 diabetes mellitus with diabetic peripheral angiopathy without gangrene: Secondary | ICD-10-CM

## 2021-10-28 DIAGNOSIS — Z87891 Personal history of nicotine dependence: Secondary | ICD-10-CM | POA: Diagnosis not present

## 2021-10-28 DIAGNOSIS — I517 Cardiomegaly: Secondary | ICD-10-CM | POA: Diagnosis not present

## 2021-10-28 DIAGNOSIS — R079 Chest pain, unspecified: Secondary | ICD-10-CM | POA: Diagnosis not present

## 2021-10-28 DIAGNOSIS — J9811 Atelectasis: Secondary | ICD-10-CM | POA: Diagnosis not present

## 2021-10-28 DIAGNOSIS — I10 Essential (primary) hypertension: Secondary | ICD-10-CM | POA: Diagnosis not present

## 2021-10-28 DIAGNOSIS — R109 Unspecified abdominal pain: Secondary | ICD-10-CM | POA: Diagnosis not present

## 2021-10-28 DIAGNOSIS — D1771 Benign lipomatous neoplasm of kidney: Secondary | ICD-10-CM | POA: Diagnosis not present

## 2021-10-28 LAB — CBC WITH DIFFERENTIAL/PLATELET
Abs Immature Granulocytes: 0.09 10*3/uL — ABNORMAL HIGH (ref 0.00–0.07)
Basophils Absolute: 0 10*3/uL (ref 0.0–0.1)
Basophils Relative: 0 %
Eosinophils Absolute: 0 10*3/uL (ref 0.0–0.5)
Eosinophils Relative: 0 %
HCT: 27.9 % — ABNORMAL LOW (ref 36.0–46.0)
Hemoglobin: 8.5 g/dL — ABNORMAL LOW (ref 12.0–15.0)
Immature Granulocytes: 1 %
Lymphocytes Relative: 7 %
Lymphs Abs: 0.6 10*3/uL — ABNORMAL LOW (ref 0.7–4.0)
MCH: 30.4 pg (ref 26.0–34.0)
MCHC: 30.5 g/dL (ref 30.0–36.0)
MCV: 99.6 fL (ref 80.0–100.0)
Monocytes Absolute: 0.1 10*3/uL (ref 0.1–1.0)
Monocytes Relative: 1 %
Neutro Abs: 7.1 10*3/uL (ref 1.7–7.7)
Neutrophils Relative %: 91 %
Platelets: 120 10*3/uL — ABNORMAL LOW (ref 150–400)
RBC: 2.8 MIL/uL — ABNORMAL LOW (ref 3.87–5.11)
RDW: 16.1 % — ABNORMAL HIGH (ref 11.5–15.5)
WBC: 7.8 10*3/uL (ref 4.0–10.5)
nRBC: 2.7 % — ABNORMAL HIGH (ref 0.0–0.2)

## 2021-10-28 LAB — COMPREHENSIVE METABOLIC PANEL
ALT: 20 U/L (ref 0–44)
AST: 35 U/L (ref 15–41)
Albumin: 3.4 g/dL — ABNORMAL LOW (ref 3.5–5.0)
Alkaline Phosphatase: 84 U/L (ref 38–126)
Anion gap: 10 (ref 5–15)
BUN: 12 mg/dL (ref 8–23)
CO2: 22 mmol/L (ref 22–32)
Calcium: 8.4 mg/dL — ABNORMAL LOW (ref 8.9–10.3)
Chloride: 102 mmol/L (ref 98–111)
Creatinine, Ser: 0.95 mg/dL (ref 0.44–1.00)
GFR, Estimated: >60 mL/min (ref >60)
Glucose, Bld: 153 mg/dL — ABNORMAL HIGH (ref 70–99)
Potassium: 3.4 mmol/L — ABNORMAL LOW (ref 3.5–5.1)
Sodium: 134 mmol/L — ABNORMAL LOW (ref 135–145)
Total Bilirubin: 1.6 mg/dL — ABNORMAL HIGH (ref 0.3–1.2)
Total Protein: 6.6 g/dL (ref 6.5–8.1)

## 2021-10-28 LAB — TROPONIN I (HIGH SENSITIVITY)
Troponin I (High Sensitivity): 9 ng/L (ref <18)
Troponin I (High Sensitivity): 8 ng/L (ref <18)

## 2021-10-28 LAB — RESP PANEL BY RT-PCR (FLU A&B, COVID) ARPGX2
Influenza A by PCR: NEGATIVE
Influenza B by PCR: NEGATIVE
SARS Coronavirus 2 by RT PCR: NEGATIVE

## 2021-10-28 LAB — LACTIC ACID, PLASMA
Lactic Acid, Venous: 4.4 mmol/L (ref 0.5–1.9)
Lactic Acid, Venous: 1.6 mmol/L (ref 0.5–1.9)

## 2021-10-28 LAB — PROTIME-INR
INR: 1.9 — ABNORMAL HIGH (ref 0.8–1.2)
Prothrombin Time: 21.8 seconds — ABNORMAL HIGH (ref 11.4–15.2)

## 2021-10-28 LAB — LIPASE, BLOOD: Lipase: 36 U/L (ref 11–51)

## 2021-10-28 MED ORDER — SODIUM CHLORIDE 0.9 % IV SOLN
2.0000 g | Freq: Once | INTRAVENOUS | Status: AC
  Administered 2021-10-28: 2 g via INTRAVENOUS
  Filled 2021-10-28: qty 2

## 2021-10-28 MED ORDER — SODIUM CHLORIDE 0.9 % IV SOLN: INTRAVENOUS | Status: DC

## 2021-10-28 MED ORDER — SODIUM CHLORIDE 0.9% FLUSH
3.0000 mL | Freq: Two times a day (BID) | INTRAVENOUS | Status: DC
  Administered 2021-10-28 – 2021-10-30 (×4): 3 mL via INTRAVENOUS

## 2021-10-28 MED ORDER — SODIUM CHLORIDE 0.9 % IV SOLN
1.0000 g | INTRAVENOUS | Status: DC
  Administered 2021-10-28 – 2021-10-29 (×2): 1 g via INTRAVENOUS
  Filled 2021-10-28 (×3): qty 10

## 2021-10-28 MED ORDER — ENOXAPARIN SODIUM 40 MG/0.4ML IJ SOSY: 40.0000 mg | PREFILLED_SYRINGE | INTRAMUSCULAR | Status: DC

## 2021-10-28 MED ORDER — HYDROMORPHONE HCL 2 MG PO TABS
2.0000 mg | ORAL_TABLET | ORAL | Status: DC | PRN
Start: 2021-10-28 — End: 2021-10-30
  Administered 2021-10-29: 2 mg via ORAL
  Administered 2021-10-29 – 2021-10-30 (×3): 4 mg via ORAL
  Filled 2021-10-28 (×3): qty 2
  Filled 2021-10-28: qty 1

## 2021-10-28 MED ORDER — VANCOMYCIN HCL 1500 MG/300ML IV SOLN
1500.0000 mg | Freq: Once | INTRAVENOUS | Status: AC
  Administered 2021-10-28: 1500 mg via INTRAVENOUS
  Filled 2021-10-28: qty 300

## 2021-10-28 MED ORDER — HYDROMORPHONE HCL 2 MG PO TABS
2.0000 mg | ORAL_TABLET | ORAL | Status: DC | PRN
Start: 2021-10-28 — End: 2021-10-28

## 2021-10-28 MED ORDER — ACETAMINOPHEN 325 MG PO TABS
650.0000 mg | ORAL_TABLET | Freq: Four times a day (QID) | ORAL | Status: DC | PRN
  Filled 2021-10-28: qty 2

## 2021-10-28 MED ORDER — ACETAMINOPHEN 650 MG RE SUPP: 650.0000 mg | Freq: Four times a day (QID) | RECTAL | Status: DC | PRN

## 2021-10-28 MED ORDER — VANCOMYCIN HCL IN DEXTROSE 1-5 GM/200ML-% IV SOLN: 1000.0000 mg | Freq: Once | INTRAVENOUS | Status: DC

## 2021-10-28 MED ORDER — METRONIDAZOLE 500 MG/100ML IV SOLN
500.0000 mg | Freq: Once | INTRAVENOUS | Status: AC
  Administered 2021-10-28: 500 mg via INTRAVENOUS
  Filled 2021-10-28: qty 100

## 2021-10-28 MED ORDER — HYDROMORPHONE HCL 1 MG/ML IJ SOLN
0.5000 mg | INTRAMUSCULAR | Status: AC | PRN
  Administered 2021-10-28 – 2021-10-29 (×2): 0.5 mg via INTRAVENOUS
  Filled 2021-10-28 (×2): qty 1

## 2021-10-28 MED ORDER — FENTANYL CITRATE PF 50 MCG/ML IJ SOSY
50.0000 ug | PREFILLED_SYRINGE | Freq: Once | INTRAMUSCULAR | Status: AC
  Administered 2021-10-28: 50 ug via INTRAVENOUS
  Filled 2021-10-28: qty 1

## 2021-10-28 MED ORDER — ONDANSETRON HCL 4 MG/2ML IJ SOLN: 4.0000 mg | Freq: Four times a day (QID) | INTRAMUSCULAR | Status: DC | PRN

## 2021-10-28 MED ORDER — SODIUM CHLORIDE 0.9 % IV BOLUS (SEPSIS)
1500.0000 mL | Freq: Once | INTRAVENOUS | Status: AC
  Administered 2021-10-28: 1500 mL via INTRAVENOUS

## 2021-10-28 MED ORDER — VENLAFAXINE HCL ER 75 MG PO CP24
75.0000 mg | ORAL_CAPSULE | Freq: Every day | ORAL | Status: DC
  Administered 2021-10-29 – 2021-10-30 (×2): 75 mg via ORAL
  Filled 2021-10-28 (×2): qty 1

## 2021-10-28 MED ORDER — PANTOPRAZOLE SODIUM 40 MG PO TBEC: 40.0000 mg | DELAYED_RELEASE_TABLET | Freq: Every day | ORAL | Status: DC

## 2021-10-28 MED ORDER — SODIUM CHLORIDE 0.9 % IV BOLUS
1000.0000 mL | Freq: Once | INTRAVENOUS | Status: AC
  Administered 2021-10-28: 1000 mL via INTRAVENOUS

## 2021-10-28 MED ORDER — RIVAROXABAN 10 MG PO TABS
10.0000 mg | ORAL_TABLET | Freq: Every day | ORAL | Status: DC
  Administered 2021-10-29 – 2021-10-30 (×2): 10 mg via ORAL
  Filled 2021-10-28 (×2): qty 1

## 2021-10-28 MED ORDER — POLYETHYLENE GLYCOL 3350 17 G PO PACK: 17.0000 g | PACK | Freq: Every day | ORAL | Status: DC | PRN

## 2021-10-28 MED ORDER — ONDANSETRON HCL 4 MG PO TABS
4.0000 mg | ORAL_TABLET | Freq: Four times a day (QID) | ORAL | Status: DC | PRN
  Administered 2021-10-29: 4 mg via ORAL
  Filled 2021-10-28: qty 1

## 2021-10-28 MED ORDER — ONDANSETRON HCL 4 MG/2ML IJ SOLN
4.0000 mg | Freq: Once | INTRAMUSCULAR | Status: AC
  Administered 2021-10-28: 4 mg via INTRAVENOUS
  Filled 2021-10-28: qty 2

## 2021-10-28 MED ORDER — LEVOTHYROXINE SODIUM 150 MCG PO TABS
150.0000 ug | ORAL_TABLET | Freq: Every day | ORAL | Status: AC
  Administered 2021-10-29: 150 ug via ORAL
  Filled 2021-10-28: qty 1
  Filled 2021-10-28: qty 3

## 2021-10-28 MED ORDER — IOHEXOL 350 MG/ML SOLN
80.0000 mL | Freq: Once | INTRAVENOUS | Status: AC | PRN
  Administered 2021-10-28: 80 mL via INTRAVENOUS

## 2021-10-28 MED ORDER — METHOCARBAMOL 500 MG PO TABS
500.0000 mg | ORAL_TABLET | Freq: Four times a day (QID) | ORAL | Status: DC | PRN
  Administered 2021-10-28 – 2021-10-29 (×3): 500 mg via ORAL
  Filled 2021-10-28 (×3): qty 1

## 2021-10-28 MED ORDER — FLEET ENEMA 7-19 GM/118ML RE ENEM: 1.0000 | ENEMA | Freq: Once | RECTAL | Status: DC | PRN

## 2021-10-28 MED ORDER — POTASSIUM CHLORIDE CRYS ER 20 MEQ PO TBCR
20.0000 meq | EXTENDED_RELEASE_TABLET | Freq: Once | ORAL | Status: AC
  Administered 2021-10-28: 20 meq via ORAL
  Filled 2021-10-28: qty 1

## 2021-10-28 MED ORDER — PANTOPRAZOLE SODIUM 40 MG IV SOLR
40.0000 mg | INTRAVENOUS | Status: DC
  Administered 2021-10-28 – 2021-10-29 (×2): 40 mg via INTRAVENOUS
  Filled 2021-10-28 (×2): qty 40

## 2021-10-28 MED ORDER — BISACODYL 5 MG PO TBEC: 5.0000 mg | DELAYED_RELEASE_TABLET | Freq: Every day | ORAL | Status: DC | PRN

## 2021-10-28 NOTE — ED Triage Notes (Signed)
Pt BIB EMS. Pt coming from [facility] home c/o abd pain and knee pain. Pt thinks abd could be from her gastric bypass that she had 7 years ago, gas or her not being about to make a BM for the last two days. Pt c/o knee pain, pt had knee surgery last Monday.   VSS.

## 2021-10-28 NOTE — Progress Notes (Signed)
A consult was received from an ED physician for vancomycin and cefepime per pharmacy dosing.  The patient's profile has been reviewed for ht/wt/allergies/indication/available labs.   A one time order has been placed for vancomycin 1500 mg IV x1 and cefepime 2g IV x1.  Further antibiotics/pharmacy consults should be ordered by admitting physician if indicated.                       Thank you,  Dimple Nanas, PharmD 10/28/2021 2:33 PM

## 2021-10-28 NOTE — Sepsis Progress Note (Signed)
eLink is following this Code Sepsis. °

## 2021-10-28 NOTE — Progress Notes (Signed)
BLE venous duplex has been completed.  Preliminary results given to Dr. Tomi Bamberger.    Results can be found under chart review under CV PROC. 10/28/2021 2:39 PM Selma Mink RVT, RDMS

## 2021-10-28 NOTE — ED Provider Notes (Signed)
Patient presented initially with concerns for possible sepsis was treated with broad-spectrum antibiotics.  Was hypotensive.  Initial lactic acid was 4.4.  Patient received fluids and broad-spectrum antibiotics.  Would require admission just for this.  Patient a week ago had total knee done by Dr. Reynaldo Minium [facility]  That does appear as if there could possibly be infection related to that.  Doppler study done and that leg was negative for DVT.  Ortho may require an x-ray of that leg.  We will see what they think and we can order that.  Discussed with hospitalist regarding admission.  So patient had CT angio chest and CT abdomen pelvis no acute findings on that.  Patient without a leukocytosis white blood cell count was 7.8.  Hemoglobin relatively stable for her at 8.5.  Patient following the fluids is overall feeling better.  COVID testing influenza testing is negative.     Fredia Sorrow, MD 10/28/21 1910

## 2021-10-28 NOTE — H&P (Signed)
History and Physical    Stacy Ayers DOB: 04-18-1941 DOA: 10/28/2021  PCP: [name], DO   Patient coming from: SNF   Chief Complaint: left knee and abdominal pain   HPI: Stacy Ayers is a pleasant 80 y.o. female with medical history significant for hypertension, hypothyroidism, depression, history of gastric bypass, and osteoporosis status post total left knee arthroplasty on 10/19/2021, now presenting from her SNF for evaluation of pain involving her left knee and abdomen.  Patient had tolerated the recent knee surgery well and was discharged to SNF for rehabilitation on 10/20/2021, has been struggling with constipation there, and has been requiring frequent doses of oral Dilaudid for severe left knee pain.  She had some redness around the left knee when she was evaluated by the SNF physician on 10/26/2021 and was prescribed a course of Keflex for possible cellulitis.  She denies fevers or chills, has not had any drainage from the knee, but has had persistent pain ever since the surgery.  She reports only 1 bowel movement since her discharge, has taken 1 dose of MiraLAX, and then developed pain across her upper abdomen today without nausea or vomiting.  ED Course: Upon arrival to the ED, patient is found to be afebrile, blood pressure as low as 87/64, and slightly tachycardic at 1 point.  EKG features sinus rhythm and chest x-ray with mild cardiomegaly and mild atelectasis or scar at the left base.  KUB was negative.  Venous ultrasound of lower extremity was negative for left DVT or superficial thrombosis, and negative for obstruction of the right common femoral vein.  CTA chest was negative for PE and no acute bowel process was noted on CT of the abdomen and pelvis.  Basic blood work in the ED was notable for potassium 3.4, total bilirubin 1.6, hemoglobin 8.5, and platelets 120,000.  Lactic acid was elevated to 4.4, then down to 1.6 after IV fluids.  Blood cultures were  collected in the ED and the patient was given 2.25 L of saline, fentanyl, vancomycin, cefepime, and Flagyl. Orthopedic surgery was consulted by ED physician.   Review of Systems:  All other systems reviewed and apart from HPI, are negative.  Past Medical History:  Diagnosis Date   Abnormal liver function tests    Allergy    Anxiety    Arthritis    knees, lower back    Asthma    Cancer ([place])    Cataract    left eye growing cataract    Depression    Diabetes mellitus    had gastric bypass DM resolved-    Diverticulosis    GERD (gastroesophageal reflux disease)    Headache    Previous migraines   Heart murmur    History of colon polyps    adenomatous   Hyperlipidemia    past hx- resloved after gastric bypass    Hypertension    Hypothyroidism    IBS (irritable bowel syndrome)    Iron deficiency anemia    Osteoporosis     Past Surgical History:  Procedure Laterality Date   ABDOMINAL HYSTERECTOMY     ABDOMINAL HYSTERECTOMY     APPENDECTOMY     BLADDER REPAIR     BREAST BIOPSY Right    needle core biopsy, benign   CATARACT EXTRACTION Left    CHOLECYSTECTOMY     COLONOSCOPY     CORONARY ANGIOPLASTY     GASTRIC BYPASS     POLYPECTOMY  REVERSE SHOULDER ARTHROPLASTY Right 07/15/2020   Procedure: REVERSE SHOULDER ARTHROPLASTY;  Surgeon: Justice Britain, MD;  Location: [facility];  Service: Orthopedics;  Laterality: Right;  122min   TONSILLECTOMY AND ADENOIDECTOMY     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   Procedure: TOTAL KNEE ARTHROPLASTY;  Surgeon: Gaynelle Arabian, MD;  Location: [facility];  Service: Orthopedics;  Laterality: Left;    Social History:   reports that she quit smoking about 59 years ago. Her smoking use included cigarettes. She has a 0.60 pack-year smoking history. She has never used smokeless tobacco. She reports current alcohol use. She reports that she does not use drugs.  Allergies  Allergen Reactions   Sulfonamide Derivatives Diarrhea   Cocaine      Other reaction(s): Hallucination Sinus irrigation   Montelukast Sodium     UNKNOWN   Penicillins     Local skin reaction.  Tolerated ancef 07/15/2020 Tolerated Cephalosporin Date: 10/20/21.     Pioglitazone     UNKNOWN    Rosiglitazone Maleate     UNKNOWN   Secobarbital Sodium     UNKNOWN   Codeine Rash   Doxycycline Nausea Only    REACTION: GI UPSET    Family History  Problem Relation Age of Onset   Stroke Mother    Asthma Mother    Anuerysm Mother    Obesity Father    Diabetes Sister    Heart disease Maternal Grandmother    Angina Maternal Grandmother    Suicidality Maternal Grandfather    Cancer Paternal Grandmother    Colon cancer Neg Hx    Colon polyps Neg Hx    Esophageal cancer Neg Hx    Rectal cancer Neg Hx    Stomach cancer Neg Hx    Pancreatic cancer Neg Hx    Liver disease Neg Hx      Prior to Admission medications   Medication Sig Start Date End Date Taking? Authorizing Provider  acetaminophen (TYLENOL) 650 MG CR tablet Take 650 mg by mouth every 8 (eight) hours as needed for pain.    [provider]  albuterol (PROVENTIL HFA) 108 (90 Base) MCG/ACT inhaler Inhale 2 puffs into the lungs every 6 (six) hours as needed. 12/16/20   [name], DO  denosumab (PROLIA) 60 MG/ML SOSY injection Inject 60 mg into the skin every 6 (six) months. Deliver to [address], [place], [place] [address] 07/07/21   Collier Salina, MD  docusate sodium (COLACE) 100 MG capsule Take 100 mg by mouth 2 (two) times daily.    [provider]  ferrous sulfate 325 (65 FE) MG tablet Take 325 mg by mouth daily with breakfast.    [provider]  HYDROmorphone (DILAUDID) 2 MG tablet Take 1-2 tablets (2-4 mg total) by mouth every 4 (four) hours as needed for severe pain or moderate pain. 10/24/21   Virgie Dad, MD  levothyroxine (SYNTHROID) 150 MCG tablet Take 1 tablet (150 mcg total) by mouth daily before breakfast. Patient taking  differently: Take 150 mcg by mouth every Monday, Wednesday, and Friday. 11/06/20   [name], DO  loperamide (IMODIUM) 2 MG capsule Take 2 mg by mouth as needed for diarrhea or loose stools.     [provider]  losartan (COZAAR) 50 MG tablet Take 1 tablet (50 mg total) by mouth daily. 06/03/21   [name], DO  methocarbamol (ROBAXIN) 500 MG tablet Take 1 tablet (500 mg total) by mouth  every 6 (six) hours as needed for muscle spasms. 10/20/21   Edmisten, Kristie L, PA  pantoprazole (PROTONIX) 40 MG tablet Take 1 tablet (40 mg total) by mouth daily. 11/06/20   [name], DO  rivaroxaban (XARELTO) 10 MG TABS tablet Take 1 tablet (10 mg total) by mouth daily with breakfast for 20 days. 10/21/21 11/10/21  Edmisten, Kristie L, PA  SYNTHROID 137 MCG tablet TAKE 1 TABLET ONCE DAILY ON MONDAY, WEDNESDAY, AND FRIDAY. TAKE 150MCG DAILY ALL OTHER DAYS. Patient taking differently: Take 137 mcg by mouth every Tuesday, Thursday, Saturday, and Sunday. . 10/06/21   Carollee Herter, Yvonne R, DO  venlafaxine XR (EFFEXOR-XR) 75 MG 24 hr capsule TAKE 1 CAPSULE ONCE DAILY WITH BREAKFAST. 10/12/21   [name], DO    Physical Exam: Vitals:   10/28/21 1800 10/28/21 1830 10/28/21 1900 10/28/21 1930  BP: (!) 124/55 113/67 (!) 123/55 (!) 125/56  Pulse: 97 100 95 89  Resp: 11 18 17 15   Temp:      TempSrc:      SpO2: 94% 97% 93% 96%  Weight:      Height:        Constitutional: NAD, calm  Eyes: PERTLA, lids and conjunctivae normal ENMT: Mucous membranes are moist. Posterior pharynx clear of any exudate or lesions.   Neck: supple, no masses  Respiratory: no wheezing, no crackles. No accessory muscle use.  Cardiovascular: S1 & S2 heard, regular rate and rhythm. No significant JVD. Abdomen: No distension, no tenderness, soft. Bowel sounds active.  Musculoskeletal: no clubbing / cyanosis. No joint deformity upper and lower extremities.   Skin: erythema, ecchymosis, and  tenderness surrounding left knee where there is clean and dry surgical dressing. Warm, dry, well-perfused. Neurologic: CN 2-12 grossly intact. Moving all extremities. Alert and oriented.  Psychiatric: Pleasant. Cooperative.    Labs and Imaging on Admission: I have personally reviewed following labs and imaging studies  CBC: Recent Labs  Lab 10/23/21 0000 10/28/21 1309  WBC 8.6 7.8  NEUTROABS  --  7.1  HGB 7.9* 8.5*  HCT 24* 27.9*  MCV  --  99.6  PLT 266 109*   Basic Metabolic Panel: Recent Labs  Lab 10/23/21 0000 10/28/21 1309  NA 135* 134*  K 3.5 3.4*  CL 99 102  CO2 29* 22  GLUCOSE  --  153*  BUN 11 12  CREATININE 0.6 0.95  CALCIUM 8.6* 8.4*   GFR: Estimated Creatinine Clearance: 52.5 mL/min (by C-G formula based on SCr of 0.95 mg/dL). Liver Function Tests: Recent Labs  Lab 10/28/21 1309  AST 35  ALT 20  ALKPHOS 84  BILITOT 1.6*  PROT 6.6  ALBUMIN 3.4*   Recent Labs  Lab 10/28/21 1309  LIPASE 36   No results for input(s): AMMONIA in the last 168 hours. Coagulation Profile: Recent Labs  Lab 10/28/21 1332  INR 1.9*   Cardiac Enzymes: No results for input(s): CKTOTAL, CKMB, CKMBINDEX, TROPONINI in the last 168 hours. BNP (last 3 results) No results for input(s): PROBNP in the last 8760 hours. HbA1C: No results for input(s): HGBA1C in the last 72 hours. CBG: No results for input(s): GLUCAP in the last 168 hours. Lipid Profile: No results for input(s): CHOL, HDL, LDLCALC, TRIG, CHOLHDL, LDLDIRECT in the last 72 hours. Thyroid Function Tests: No results for input(s): TSH, T4TOTAL, FREET4, T3FREE, THYROIDAB in the last 72 hours. Anemia Panel: No results for input(s): VITAMINB12, FOLATE, FERRITIN, TIBC, IRON, RETICCTPCT in the last 72 hours.  Urine analysis:    Component Value Date/Time   COLORURINE straw 07/16/2009 1328   APPEARANCEUR Cloudy 07/16/2009 1328   LABSPEC 1.025 07/16/2009 1328   PHURINE 6.0 07/16/2009 1328   GLUCOSEU NEGATIVE  08/12/2008 1611   HGBUR negative 07/16/2009 1328   BILIRUBINUR Negative 01/31/2020 1448   KETONESUR NEGATIVE 08/12/2008 1611   PROTEINUR Negative 01/31/2020 1448   PROTEINUR NEGATIVE 08/12/2008 1611   UROBILINOGEN 0.2 01/31/2020 1448   UROBILINOGEN 0.2 07/16/2009 1328   NITRITE Negative 01/31/2020 1448   NITRITE positive 07/16/2009 1328   LEUKOCYTESUR Moderate (2+) (A) 01/31/2020 1448   Sepsis Labs: @LABRCNTIP (procalcitonin:4,lacticidven:4) ) Recent Results (from the past 240 hour(s))  Resp Panel by RT-PCR (Flu A&B, Covid) Nasopharyngeal Swab     Status: None   Collection Time: 10/20/21 12:56 PM   Specimen: Nasopharyngeal Swab; Nasopharyngeal(NP) swabs in vial transport medium  Result Value Ref Range Status   SARS Coronavirus 2 by RT PCR NEGATIVE NEGATIVE Final    Comment: (NOTE) SARS-CoV-2 target nucleic acids are NOT DETECTED.  The SARS-CoV-2 RNA is generally detectable in upper respiratory specimens during the acute phase of infection. The lowest concentration of SARS-CoV-2 viral copies this assay can detect is 138 copies/mL. A negative result does not preclude SARS-Cov-2 infection and should not be used as the sole basis for treatment or other patient management decisions. A negative result may occur with  improper specimen collection/handling, submission of specimen other than nasopharyngeal swab, presence of viral mutation(s) within the areas targeted by this assay, and inadequate number of viral copies(<138 copies/mL). A negative result must be combined with clinical observations, patient history, and epidemiological information. The expected result is Negative.  Fact Sheet for Patients:  EntrepreneurPulse.com.au  Fact Sheet for Healthcare Providers:  IncredibleEmployment.be  This test is no t yet approved or cleared by the [place] FDA and  has been authorized for detection and/or diagnosis of SARS-CoV-2 by FDA under an  Emergency Use Authorization (EUA). This EUA will remain  in effect (meaning this test can be used) for the duration of the COVID-19 declaration under Section 564(b)(1) of the Act, 21 U.S.C.section 360bbb-3(b)(1), unless the authorization is terminated  or revoked sooner.       Influenza A by PCR NEGATIVE NEGATIVE Final   Influenza B by PCR NEGATIVE NEGATIVE Final    Comment: (NOTE) The Xpert Xpress SARS-CoV-2/FLU/RSV plus assay is intended as an aid in the diagnosis of influenza from Nasopharyngeal swab specimens and should not be used as a sole basis for treatment. Nasal washings and aspirates are unacceptable for Xpert Xpress SARS-CoV-2/FLU/RSV testing.  Fact Sheet for Patients: EntrepreneurPulse.com.au  Fact Sheet for Healthcare Providers: IncredibleEmployment.be  This test is not yet approved or cleared by the [place] FDA and has been authorized for detection and/or diagnosis of SARS-CoV-2 by FDA under an Emergency Use Authorization (EUA). This EUA will remain in effect (meaning this test can be used) for the duration of the COVID-19 declaration under Section 564(b)(1) of the Act, 21 U.S.C. section 360bbb-3(b)(1), unless the authorization is terminated or revoked.  Performed at [facility], [place] [address], [place], [place] [address]   Resp Panel by RT-PCR (Flu A&B, Covid) Nasopharyngeal Swab     Status: None   Collection Time: 10/28/21  1:09 PM   Specimen: Nasopharyngeal Swab; Nasopharyngeal(NP) swabs in vial transport medium  Result Value Ref Range Status   SARS Coronavirus 2 by RT PCR NEGATIVE NEGATIVE Final    Comment: (NOTE) SARS-CoV-2 target nucleic acids  are NOT DETECTED.  The SARS-CoV-2 RNA is generally detectable in upper respiratory specimens during the acute phase of infection. The lowest concentration of SARS-CoV-2 viral copies this assay can detect is 138 copies/mL. A negative result does not  preclude SARS-Cov-2 infection and should not be used as the sole basis for treatment or other patient management decisions. A negative result may occur with  improper specimen collection/handling, submission of specimen other than nasopharyngeal swab, presence of viral mutation(s) within the areas targeted by this assay, and inadequate number of viral copies(<138 copies/mL). A negative result must be combined with clinical observations, patient history, and epidemiological information. The expected result is Negative.  Fact Sheet for Patients:  EntrepreneurPulse.com.au  Fact Sheet for Healthcare Providers:  IncredibleEmployment.be  This test is no t yet approved or cleared by the [place] FDA and  has been authorized for detection and/or diagnosis of SARS-CoV-2 by FDA under an Emergency Use Authorization (EUA). This EUA will remain  in effect (meaning this test can be used) for the duration of the COVID-19 declaration under Section 564(b)(1) of the Act, 21 U.S.C.section 360bbb-3(b)(1), unless the authorization is terminated  or revoked sooner.       Influenza A by PCR NEGATIVE NEGATIVE Final   Influenza B by PCR NEGATIVE NEGATIVE Final    Comment: (NOTE) The Xpert Xpress SARS-CoV-2/FLU/RSV plus assay is intended as an aid in the diagnosis of influenza from Nasopharyngeal swab specimens and should not be used as a sole basis for treatment. Nasal washings and aspirates are unacceptable for Xpert Xpress SARS-CoV-2/FLU/RSV testing.  Fact Sheet for Patients: EntrepreneurPulse.com.au  Fact Sheet for Healthcare Providers: IncredibleEmployment.be  This test is not yet approved or cleared by the [place] FDA and has been authorized for detection and/or diagnosis of SARS-CoV-2 by FDA under an Emergency Use Authorization (EUA). This EUA will remain in effect (meaning this test can be used) for the  duration of the COVID-19 declaration under Section 564(b)(1) of the Act, 21 U.S.C. section 360bbb-3(b)(1), unless the authorization is terminated or revoked.  Performed at [facility], [place] [address], [place], [place] [address]      Radiological Exams on Admission: DG Abdomen 1 View  Result Date: 10/28/2021 CLINICAL DATA:  Abdominal pain. EXAM: ABDOMEN - 1 VIEW COMPARISON:  None. FINDINGS: The bowel gas pattern is normal. No radio-opaque calculi or other significant radiographic abnormality are seen. IMPRESSION: Negative. Electronically Signed   By: Marijo Conception M.D.   On: 10/28/2021 15:39   CT Angio Chest Pulmonary Embolism (PE) W or WO Contrast  Result Date: 10/28/2021 CLINICAL DATA:  Suspected bowel obstruction, chest pain and shortness of breath. 80 year old female. EXAM: CT ANGIOGRAPHY CHEST CT ABDOMEN AND PELVIS WITH CONTRAST TECHNIQUE: Multidetector CT imaging of the chest was performed using the standard protocol during bolus administration of intravenous contrast. Multiplanar CT image reconstructions and MIPs were obtained to evaluate the vascular anatomy. Multidetector CT imaging of the abdomen and pelvis was performed using the standard protocol during bolus administration of intravenous contrast. CONTRAST:  25mL OMNIPAQUE IOHEXOL 350 MG/ML SOLN COMPARISON:  August 12, 2008 chest CT. FINDINGS: CTA CHEST FINDINGS Cardiovascular: Scattered aortic atherosclerosis. Normal caliber of the thoracic aorta. Normal caliber of the main pulmonary arteries. Main pulmonary artery is moderately well opacified at 245 Hounsfield units. There is some motion at the lung bases which does limit assessment. No signs of pulmonary embolism to the segmental level. Mediastinum/Nodes: No thoracic inlet, axillary, mediastinal or hilar adenopathy. Esophagus grossly  normal. Lungs/Pleura: Basilar atelectasis. No effusion. No consolidative process. Musculoskeletal: No acute bone finding. No  destructive bone process. Spinal degenerative changes. See below for full musculoskeletal detail about the abdomen and pelvis. Review of the MIP images confirms the above findings. CT ABDOMEN and PELVIS FINDINGS Hepatobiliary: Liver contour is lobular with fissural widening. Post cholecystectomy. No biliary duct distension. Portal vein is patent. No focal, suspicious hepatic lesion. Pancreas: Normal contour without signs of inflammation. Spleen: Top normal size, no visible lesion. Adrenals/Urinary Tract: Adrenal glands are normal. Symmetric renal enhancement without hydronephrosis. No nephrolithiasis. Small amount of gas in the urinary bladder. Small cysts in the bilateral kidneys. 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 Stomach/Bowel: Signs of gastric bypass procedure. Ingested tablet seen in the dependent portion of the gastric pouch does not changed density from arterial to venous phase or change in size. Small hiatal hernia. Jejunojejunostomy in the LEFT upper quadrant. No dilation of the E Ferrante limb or substantial dilation of the excluded stomach. No acute gastrointestinal process. Appendix not visualized though there are no secondary signs to suggest acute appendicitis. Vascular/Lymphatic: Aortic atherosclerosis. No sign of aneurysm. Smooth contour of the IVC. There is no gastrohepatic or hepatoduodenal ligament lymphadenopathy. No retroperitoneal or mesenteric lymphadenopathy. No pelvic sidewall lymphadenopathy. Atherosclerotic changes are mild. Reproductive: Unremarkable. Other: Small midline ventral hernia above the umbilicus contains fat only. No ascites. No free air. Musculoskeletal: No acute bone finding. No destructive bone process. Spinal degenerative changes. Degenerative changes are moderate to marked and worse towards the lower lumbar spine. Review of the MIP images confirms the above findings. IMPRESSION: Negative for pulmonary embolism to the segmental level. Mildly compromised  bolus timing and respiratory motion with some limitation. Small amount of gas in the urinary bladder. Correlate with recent instrumentation. Signs of gastric bypass procedure without signs of obstruction or acute bowel process. Small hiatal hernia. Small midline ventral hernia above the umbilicus contains only fat. Signs of potential liver disease with lobular hepatic contours. 1 cm RIGHT renal AML. Aortic Atherosclerosis (ICD10-I70.0). Electronically Signed   By: Zetta Bills M.D.   On: 10/28/2021 17:34   CT ABDOMEN PELVIS W CONTRAST  Result Date: 10/28/2021 CLINICAL DATA:  Suspected bowel obstruction, chest pain and shortness of breath. 80 year old female. EXAM: CT ANGIOGRAPHY CHEST CT ABDOMEN AND PELVIS WITH CONTRAST TECHNIQUE: Multidetector CT imaging of the chest was performed using the standard protocol during bolus administration of intravenous contrast. Multiplanar CT image reconstructions and MIPs were obtained to evaluate the vascular anatomy. Multidetector CT imaging of the abdomen and pelvis was performed using the standard protocol during bolus administration of intravenous contrast. CONTRAST:  17mL OMNIPAQUE IOHEXOL 350 MG/ML SOLN COMPARISON:  August 12, 2008 chest CT. FINDINGS: CTA CHEST FINDINGS Cardiovascular: Scattered aortic atherosclerosis. Normal caliber of the thoracic aorta. Normal caliber of the main pulmonary arteries. Main pulmonary artery is moderately well opacified at 245 Hounsfield units. There is some motion at the lung bases which does limit assessment. No signs of pulmonary embolism to the segmental level. Mediastinum/Nodes: No thoracic inlet, axillary, mediastinal or hilar adenopathy. Esophagus grossly normal. Lungs/Pleura: Basilar atelectasis. No effusion. No consolidative process. Musculoskeletal: No acute bone finding. No destructive bone process. Spinal degenerative changes. See below for full musculoskeletal detail about the abdomen and pelvis. Review of the MIP  images confirms the above findings. CT ABDOMEN and PELVIS FINDINGS Hepatobiliary: Liver contour is lobular with fissural widening. Post cholecystectomy. No biliary duct distension. Portal vein is patent. No focal, suspicious hepatic lesion. Pancreas:  Normal contour without signs of inflammation. Spleen: Top normal size, no visible lesion. Adrenals/Urinary Tract: Adrenal glands are normal. Symmetric renal enhancement without hydronephrosis. No nephrolithiasis. Small amount of gas in the urinary bladder. Small cysts in the bilateral kidneys. 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 Stomach/Bowel: Signs of gastric bypass procedure. Ingested tablet seen in the dependent portion of the gastric pouch does not changed density from arterial to venous phase or change in size. Small hiatal hernia. Jejunojejunostomy in the LEFT upper quadrant. No dilation of the E Ferrante limb or substantial dilation of the excluded stomach. No acute gastrointestinal process. Appendix not visualized though there are no secondary signs to suggest acute appendicitis. Vascular/Lymphatic: Aortic atherosclerosis. No sign of aneurysm. Smooth contour of the IVC. There is no gastrohepatic or hepatoduodenal ligament lymphadenopathy. No retroperitoneal or mesenteric lymphadenopathy. No pelvic sidewall lymphadenopathy. Atherosclerotic changes are mild. Reproductive: Unremarkable. Other: Small midline ventral hernia above the umbilicus contains fat only. No ascites. No free air. Musculoskeletal: No acute bone finding. No destructive bone process. Spinal degenerative changes. Degenerative changes are moderate to marked and worse towards the lower lumbar spine. Review of the MIP images confirms the above findings. IMPRESSION: Negative for pulmonary embolism to the segmental level. Mildly compromised bolus timing and respiratory motion with some limitation. Small amount of gas in the urinary bladder. Correlate with recent instrumentation. Signs of  gastric bypass procedure without signs of obstruction or acute bowel process. Small hiatal hernia. Small midline ventral hernia above the umbilicus contains only fat. Signs of potential liver disease with lobular hepatic contours. 1 cm RIGHT renal AML. Aortic Atherosclerosis (ICD10-I70.0). Electronically Signed   By: Zetta Bills M.D.   On: 10/28/2021 17:34   DG Chest Portable 1 View  Result Date: 10/28/2021 CLINICAL DATA:  Abdominal pain and chest pain. Previous gastric bypass surgery. EXAM: PORTABLE CHEST 1 VIEW COMPARISON:  08/05/2010 FINDINGS: Mild cardiac enlargement. Tortuous aorta. The lungs are clear except for mild scarring or atelectasis at the left lung base. The vascularity is normal. No effusions. Previous shoulder replacement on the right. IMPRESSION: Mild cardiomegaly. Aortic atherosclerosis. Mild atelectasis or scarring at the left lung base. Electronically Signed   By: Nelson Chimes M.D.   On: 10/28/2021 15:39   VAS [place] LOWER EXTREMITY VENOUS (DVT) (7a-7p)  Result Date: 10/28/2021  Lower Venous DVT Study Patient Name:  Stacy Ayers  Date of Exam:   10/28/2021 Medical Rec #: [id number]      Accession #:    [id number] Date of Birth: 12/29/1940      Patient Gender: F Patient Age:   89 years Exam Location:  [facility] [facility] Procedure:      VAS [place] LOWER EXTREMITY VENOUS (DVT) Referring Phys: JON KNAPP --------------------------------------------------------------------------------  Indications: Pain, and Swelling.  Risk Factors: Surgery Left knee replacement 10/19/21. Comparison Study: No previous exams Performing Technologist: Jody Hill RVT, RDMS  Examination Guidelines: A complete evaluation includes B-mode imaging, spectral Doppler, color Doppler, and power Doppler as needed of all accessible portions of each vessel. Bilateral testing is considered an integral part of a complete examination. Limited examinations for reoccurring indications may be performed as noted. The reflux portion of  the exam is performed with the patient in reverse Trendelenburg.  +-----+---------------+---------+-----------+----------+--------------+ RIGHTCompressibilityPhasicitySpontaneityPropertiesThrombus Aging +-----+---------------+---------+-----------+----------+--------------+ CFV  Full           Yes      Yes                                 +-----+---------------+---------+-----------+----------+--------------+   +---------+---------------+---------+-----------+----------+--------------+  LEFT     CompressibilityPhasicitySpontaneityPropertiesThrombus Aging +---------+---------------+---------+-----------+----------+--------------+ CFV      Full           Yes      Yes                                 +---------+---------------+---------+-----------+----------+--------------+ SFJ      Full                                                        +---------+---------------+---------+-----------+----------+--------------+ FV Prox  Full           Yes      Yes                                 +---------+---------------+---------+-----------+----------+--------------+ FV Mid   Full           Yes      Yes                                 +---------+---------------+---------+-----------+----------+--------------+ FV DistalFull           Yes      Yes                                 +---------+---------------+---------+-----------+----------+--------------+ PFV      Full                                                        +---------+---------------+---------+-----------+----------+--------------+ POP      Full           Yes      Yes                                 +---------+---------------+---------+-----------+----------+--------------+ PTV      Full                                                        +---------+---------------+---------+-----------+----------+--------------+ PERO     Full                                                         +---------+---------------+---------+-----------+----------+--------------+    Summary: RIGHT: - No evidence of common femoral vein obstruction.  LEFT: - There is no evidence of deep vein thrombosis in the lower extremity. - There is no evidence of superficial venous thrombosis.  - No cystic structure found in the popliteal fossa. Subcutaneous irregularity noted by the sonographer in area of knee and calf.  *See table(s) above for measurements  and observations. Electronically signed by Jamelle Haring on 10/28/2021 at 4:05:52 PM.    Final     EKG: Independently reviewed. Sinus rhythm.   Assessment/Plan   1. Lactic acidosis; hypotension  - Presents with pain in her left knee and abdomen, had SBP in upper 80s-90s, and initial lactate of 4.4  - There is no fever, leukocytosis, or evidence for infection on CT chest and abdomen; she denies urinary sxs, has erythema and ecchymosis at left knee, but no meningismus or other notable exam finding  - Blood cultures were collected in ED and she was given 2.25 liters NS and broad-spectrum antibiotics  - Lactate and BP normal after IVF  - Does not appear septic, plan to continue Rocephin for possible non-purulent cellulitis and hold losartan    2. 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 Anemia; thrombocytopenia  - Hgb 8.5 and platelets 120k on admission  - No overt bleeding, repeat CBC in am    4. Abdominal pain  - No acute findings on CT - Exam benign  - Pain-control, PPI, bowel-regimen    5. Hypertension  - SBP as low as 80s and 90s in ED and losartan held on admission    6. Depression - Continue Effexor    7. Hypothyroidism  - Continue Synthroid    DVT prophylaxis:  Xarelto  Code Status: Full  Level of Care: Level of care: Telemetry Family Communication: Daughter updated at bedside   Disposition Plan:  Patient is from: SNF  Anticipated d/c is to: SNF  Anticipated d/c date is: 12/1 or 10/30/21  Patient currently: Pending pain-control, stable BP, ortho eval  Consults called: orthopedic surgery consulted by ED  Admission status: Observation     Vianne Bulls, MD [facility]  10/28/2021, 7:52 PM

## 2021-10-28 NOTE — ED Provider Notes (Signed)
[place] [facility] DEPT Provider Note   CSN: [id number] Arrival date & time: 10/28/21  1210     History Chief Complaint  Patient presents with   Abdominal Pain    Stacy Ayers is a 80 y.o. female.   Abdominal Pain Associated symptoms: chest pain, nausea and shortness of breath   Associated symptoms: no fever    Patient was recently admitted to the hospital on November 21.  She had a total knee replacement by Dr. Wynelle Link.  Was discharged to the [facility] for recovery.  Patient complained of some increasing knee pain in her left knee.  She had noticed more swelling.  Patient continued to receive Dilaudid every few hours at the nursing facility.  They did note some redness and bruising.  Patient also had not had a bowel meant for at least 7 days.  Patient was started on antibiotics on the 28th by the doctors at the nursing facility for possible cellulitis of the left knee.  Patient states this morning when she got up she started having severe pain in her knee.  Then she began feeling very short of breath and having pressure in her chest.  Patient also has been feeling nauseated and has started to notice discomfort in her upper abdomen.  She thinks she might have an intestinal blockage.  She has not had a bowel movement in over a week.  She denies any vomiting but states she cannot because of her prior hiatal hernia surgery.  The pain in her knee is the most severe pain that she has at this time  Past Medical History:  Diagnosis Date   Abnormal liver function tests    Allergy    Anxiety    Arthritis    knees, lower back    Asthma    Cancer ([place])    Cataract    left eye growing cataract    Depression    Diabetes mellitus    had gastric bypass DM resolved-    Diverticulosis    GERD (gastroesophageal reflux disease)    Headache    Previous migraines   Heart murmur    History of colon polyps    adenomatous   Hyperlipidemia    past hx-  resloved after gastric bypass    Hypertension    Hypothyroidism    IBS (irritable bowel syndrome)    Iron deficiency anemia    Osteoporosis     Patient Active Problem List   Diagnosis Date Noted   Primary osteoarthritis of left knee 10/19/2021   Pre-op evaluation 10/12/2021   Depression, major, single episode, mild ([place]) 07/24/2021   Age-related osteoporosis without current pathological fracture 06/25/2021   Diabetes mellitus ([place]) 04/28/2021   DM (diabetes mellitus) type II, controlled, with peripheral vascular disorder ([place]) 04/28/2021   Morbid obesity ([place]) 04/28/2021   Anemia 04/28/2021   Fatigue 03/23/2021   Right ear pain 11/06/2020   Osteoarthritis of right knee 10/01/2020   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   Viral upper respiratory tract infection 11/14/2018   OA (osteoarthritis) of knee 05/12/2018   Dysuria 05/03/2018   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   Seasonal allergies 08/11/2015   Alcohol dependence ([place]) 08/11/2015   CTS (carpal tunnel syndrome) 08/11/2015   Allergic rhinitis 12/19/2013   B12 deficiency 05/22/2013   Left foot pain 09/06/2012   Depression with anxiety 04/05/2012   PERSONAL HISTORY OF COLONIC POLYPS 01/18/2011   SPONTANEOUS ECCHYMOSES 10/13/2010   DIARRHEA,  CHRONIC 10/13/2010   PHARYNGITIS, CHRONIC 02/16/2010   Hypothyroidism 01/14/2010   History of bariatric surgery 02/25/2009   Vitamin D deficiency 09/10/2008   VARICOSE VEINS, LOWER EXTREMITIES 09/10/2008   PULMONARY NODULE 08/14/2008   FATIGUE 08/14/2008   MUSCLE PAIN 06/14/2007   BLADDER PAIN 06/14/2007   COLONIC POLYPS 03/14/2007   History of diet-controlled diabetes 03/14/2007   Hyperlipidemia LDL goal <100 03/14/2007   ANEMIA-IRON DEFICIENCY 03/14/2007   Essential hypertension 03/14/2007   VOCAL CORD PARALYSIS 03/14/2007   EDEMA, LARYNX 03/14/2007   ASTHMA 03/14/2007   Esophageal reflux 03/14/2007   LIVER FUNCTION TESTS, ABNORMAL, HX OF  03/14/2007   TONSILLECTOMY AND ADENOIDECTOMY, HX OF 03/14/2007    Past Surgical History:  Procedure Laterality Date   ABDOMINAL HYSTERECTOMY     ABDOMINAL HYSTERECTOMY     APPENDECTOMY     BLADDER REPAIR     BREAST BIOPSY Right    needle core biopsy, benign   CATARACT EXTRACTION Left    CHOLECYSTECTOMY     COLONOSCOPY     CORONARY ANGIOPLASTY     GASTRIC BYPASS     POLYPECTOMY     REVERSE SHOULDER ARTHROPLASTY Right 07/15/2020   Procedure: REVERSE SHOULDER ARTHROPLASTY;  Surgeon: Justice Britain, MD;  Location: [facility];  Service: Orthopedics;  Laterality: Right;  172min   TONSILLECTOMY AND ADENOIDECTOMY     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   Procedure: TOTAL KNEE ARTHROPLASTY;  Surgeon: Gaynelle Arabian, MD;  Location: [facility];  Service: Orthopedics;  Laterality: Left;     OB History   No obstetric history on file.     Family History  Problem Relation Age of Onset   Stroke Mother    Asthma Mother    Anuerysm Mother    Obesity Father    Diabetes Sister    Heart disease Maternal Grandmother    Angina Maternal Grandmother    Suicidality Maternal Grandfather    Cancer Paternal Grandmother    Colon cancer Neg Hx    Colon polyps Neg Hx    Esophageal cancer Neg Hx    Rectal cancer Neg Hx    Stomach cancer Neg Hx    Pancreatic cancer Neg Hx    Liver disease Neg Hx     Social History   Tobacco Use   Smoking status: Former    Packs/day: 0.30    Years: 2.00    Pack years: 0.60    Types: Cigarettes    Quit date: 07/11/1962    Years since quitting: 59.3   Smokeless tobacco: Never  Vaping Use   Vaping Use: Never used  Substance Use Topics   Alcohol use: Yes   Drug use: No    Home Medications Prior to Admission medications   Medication Sig Start Date End Date Taking? Authorizing Provider  acetaminophen (TYLENOL) 650 MG CR tablet Take 650 mg by mouth every 8 (eight) hours as needed for pain.    [provider]  albuterol (PROVENTIL HFA) 108 (90 Base)  MCG/ACT inhaler Inhale 2 puffs into the lungs every 6 (six) hours as needed. 12/16/20   [name], DO  denosumab (PROLIA) 60 MG/ML SOSY injection Inject 60 mg into the skin every 6 (six) months. Deliver to [address], [place], [place] [address] 07/07/21   Collier Salina, MD  docusate sodium (COLACE) 100 MG capsule Take 100 mg by mouth 2 (two) times daily.    [provider]  ferrous sulfate 325 (65 FE) MG tablet  Take 325 mg by mouth daily with breakfast.    [provider]  HYDROmorphone (DILAUDID) 2 MG tablet Take 1-2 tablets (2-4 mg total) by mouth every 4 (four) hours as needed for severe pain or moderate pain. 10/24/21   Virgie Dad, MD  levothyroxine (SYNTHROID) 150 MCG tablet Take 1 tablet (150 mcg total) by mouth daily before breakfast. Patient taking differently: Take 150 mcg by mouth every Monday, Wednesday, and Friday. 11/06/20   [name], DO  loperamide (IMODIUM) 2 MG capsule Take 2 mg by mouth as needed for diarrhea or loose stools.     [provider]  losartan (COZAAR) 50 MG tablet Take 1 tablet (50 mg total) by mouth daily. 06/03/21   [name], DO  methocarbamol (ROBAXIN) 500 MG tablet Take 1 tablet (500 mg total) by mouth every 6 (six) hours as needed for muscle spasms. 10/20/21   Edmisten, Kristie L, PA  pantoprazole (PROTONIX) 40 MG tablet Take 1 tablet (40 mg total) by mouth daily. 11/06/20   [name], DO  rivaroxaban (XARELTO) 10 MG TABS tablet Take 1 tablet (10 mg total) by mouth daily with breakfast for 20 days. 10/21/21 11/10/21  Edmisten, Kristie L, PA  SYNTHROID 137 MCG tablet TAKE 1 TABLET ONCE DAILY ON MONDAY, WEDNESDAY, AND FRIDAY. TAKE 150MCG DAILY ALL OTHER DAYS. Patient taking differently: Take 137 mcg by mouth every Tuesday, Thursday, Saturday, and Sunday. . 10/06/21   Carollee Herter, Yvonne R, DO  venlafaxine XR (EFFEXOR-XR) 75 MG 24 hr capsule TAKE 1 CAPSULE ONCE DAILY WITH BREAKFAST.  10/12/21   [name], DO    Allergies    Sulfonamide derivatives, Cocaine, Montelukast sodium, Penicillins, Pioglitazone, Rosiglitazone maleate, Secobarbital sodium, Codeine, and Doxycycline  Review of Systems   Review of Systems  Constitutional:  Negative for fever.  Respiratory:  Positive for chest tightness and shortness of breath.   Cardiovascular:  Positive for chest pain.  Gastrointestinal:  Positive for abdominal pain and nausea.  for a total knee arthroplasty.  For a left total knee arthroplasty  Physical Exam Updated Vital Signs BP (!) 110/45   Pulse 95   Temp 98.3 F (36.8 C) (Oral)   Resp 20   Ht 1.702 m (5\' 7" )   Wt 78 kg   SpO2 100%   BMI 26.93 kg/m   Physical Exam Vitals and nursing note reviewed.  Constitutional:      General: She is in acute distress.     Appearance: She is well-developed. She is ill-appearing.  HENT:     Head: Normocephalic and atraumatic.     Right Ear: External ear normal.     Left Ear: External ear normal.  Eyes:     General: No scleral icterus.       Right eye: No discharge.        Left eye: No discharge.     Conjunctiva/sclera: Conjunctivae normal.  Neck:     Trachea: No tracheal deviation.  Cardiovascular:     Rate and Rhythm: Normal rate and regular rhythm.  Pulmonary:     Effort: Pulmonary effort is normal. No respiratory distress.     Breath sounds: Normal breath sounds. No stridor. No wheezing or rales.  Abdominal:     General: Bowel sounds are normal. There is no distension.     Palpations: Abdomen is soft.     Tenderness: There is abdominal tenderness in the epigastric area. There is no guarding or rebound.  Musculoskeletal:        General: No tenderness or deformity.     Cervical back: Neck supple.     Left knee: Swelling and effusion present.     Comments: Erythema, effusion bruising noted on the left knee, tenderness palpation  Skin:    General: Skin is warm and dry.     Findings: No rash.   Neurological:     General: No focal deficit present.     Mental Status: She is alert.     Cranial Nerves: No cranial nerve deficit (no facial droop, extraocular movements intact, no slurred speech).     Sensory: No sensory deficit.     Motor: No abnormal muscle tone or seizure activity.     Coordination: Coordination normal.  Psychiatric:        Mood and Affect: Mood normal.    ED Results / Procedures / Treatments   Labs (all labs ordered are listed, but only abnormal results are displayed) Labs Reviewed  RESP PANEL BY RT-PCR (FLU A&B, COVID) ARPGX2  COMPREHENSIVE METABOLIC PANEL  LIPASE, BLOOD  CBC WITH DIFFERENTIAL/PLATELET  URINALYSIS, ROUTINE W REFLEX MICROSCOPIC  PROTIME-INR    EKG None  Radiology No results found.  Procedures Procedures   Medications Ordered in ED Medications  sodium chloride 0.9 % bolus 1,000 mL (has no administration in time range)    And  0.9 %  sodium chloride infusion (has no administration in time range)  ondansetron (ZOFRAN) injection 4 mg (has no administration in time range)  fentaNYL (SUBLIMAZE) injection 50 mcg (has no administration in time range)    ED Course  I have reviewed the triage vital signs and the nursing notes.  Pertinent labs & imaging results that were available during my care of the patient were reviewed by me and considered in my medical decision making (see chart for details).  Clinical Course as of 10/28/21 1759  Wed Oct 28, 2021  1426 Doppler study negative for DVT [JK]  1427 Lactic acid elevated 4.4.  Will add on cultures and start empiric antibiotics [JK]  1450 Blood pressure is improved with IV fluids.  Now 132/48.  Patient is more comfortable after pain meds [JK]    Clinical Course User Index [JK] Dorie Rank, MD   MDM Rules/Calculators/A&P                          Broad differential for this patient.  Concerned about the possibility of pulmonary embolism, acute coronary syndrome in, bowel obstruction,  joint infection   BP improved with iv fluids.  No lactic acidosis.  No signs of cardiac ischemia.  Knee exam concerning for possible infection with her increasing pain and redness.  CT scan chest abd pelvis to rule out PE with her recent surgery and bowel obstruction with her abd pain and decreased bm.  Care turned over to Dr Rogene Houston pending CT results. Final Clinical Impression(s) / ED Diagnoses pending    Dorie Rank, MD 10/28/21 0947

## 2021-10-29 DIAGNOSIS — L8990 Pressure ulcer of unspecified site, unspecified stage: Secondary | ICD-10-CM | POA: Insufficient documentation

## 2021-10-29 DIAGNOSIS — M25562 Pain in left knee: Secondary | ICD-10-CM | POA: Diagnosis not present

## 2021-10-29 DIAGNOSIS — Z8639 Personal history of other endocrine, nutritional and metabolic disease: Secondary | ICD-10-CM | POA: Diagnosis not present

## 2021-10-29 DIAGNOSIS — E872 Acidosis, unspecified: Secondary | ICD-10-CM | POA: Diagnosis not present

## 2021-10-29 DIAGNOSIS — N390 Urinary tract infection, site not specified: Secondary | ICD-10-CM

## 2021-10-29 DIAGNOSIS — D649 Anemia, unspecified: Secondary | ICD-10-CM | POA: Diagnosis not present

## 2021-10-29 DIAGNOSIS — E890 Postprocedural hypothyroidism: Secondary | ICD-10-CM | POA: Diagnosis not present

## 2021-10-29 DIAGNOSIS — R1084 Generalized abdominal pain: Secondary | ICD-10-CM | POA: Diagnosis not present

## 2021-10-29 DIAGNOSIS — N3000 Acute cystitis without hematuria: Secondary | ICD-10-CM

## 2021-10-29 DIAGNOSIS — L89301 Pressure ulcer of unspecified buttock, stage 1: Secondary | ICD-10-CM | POA: Diagnosis not present

## 2021-10-29 DIAGNOSIS — I10 Essential (primary) hypertension: Secondary | ICD-10-CM | POA: Diagnosis not present

## 2021-10-29 DIAGNOSIS — F320 Major depressive disorder, single episode, mild: Secondary | ICD-10-CM | POA: Diagnosis not present

## 2021-10-29 LAB — COMPREHENSIVE METABOLIC PANEL
ALT: 16 U/L (ref 0–44)
AST: 27 U/L (ref 15–41)
Albumin: 3 g/dL — ABNORMAL LOW (ref 3.5–5.0)
Alkaline Phosphatase: 52 U/L (ref 38–126)
Anion gap: 6 (ref 5–15)
BUN: 12 mg/dL (ref 8–23)
CO2: 23 mmol/L (ref 22–32)
Calcium: 7.3 mg/dL — ABNORMAL LOW (ref 8.9–10.3)
Chloride: 107 mmol/L (ref 98–111)
Creatinine, Ser: 0.68 mg/dL (ref 0.44–1.00)
GFR, Estimated: >60 mL/min (ref >60)
Glucose, Bld: 122 mg/dL — ABNORMAL HIGH (ref 70–99)
Potassium: 4.9 mmol/L (ref 3.5–5.1)
Sodium: 136 mmol/L (ref 135–145)
Total Bilirubin: 0.9 mg/dL (ref 0.3–1.2)
Total Protein: 5.7 g/dL — ABNORMAL LOW (ref 6.5–8.1)

## 2021-10-29 LAB — URINALYSIS, ROUTINE W REFLEX MICROSCOPIC
Bacteria, UA: NONE SEEN
Bilirubin Urine: NEGATIVE
Glucose, UA: NEGATIVE mg/dL
Hgb urine dipstick: NEGATIVE
Ketones, ur: NEGATIVE mg/dL
Nitrite: NEGATIVE
Protein, ur: NEGATIVE mg/dL
Specific Gravity, Urine: >1.046 — ABNORMAL HIGH (ref 1.005–1.030)
pH: 5 (ref 5.0–8.0)

## 2021-10-29 LAB — CBC
HCT: 23.3 % — ABNORMAL LOW (ref 36.0–46.0)
HCT: 23.8 % — ABNORMAL LOW (ref 36.0–46.0)
Hemoglobin: 7 g/dL — ABNORMAL LOW (ref 12.0–15.0)
Hemoglobin: 7.4 g/dL — ABNORMAL LOW (ref 12.0–15.0)
MCH: 30 pg (ref 26.0–34.0)
MCH: 30.6 pg (ref 26.0–34.0)
MCHC: 30 g/dL (ref 30.0–36.0)
MCHC: 31.1 g/dL (ref 30.0–36.0)
MCV: 100 fL (ref 80.0–100.0)
MCV: 98.3 fL (ref 80.0–100.0)
Platelets: 125 10*3/uL — ABNORMAL LOW (ref 150–400)
Platelets: 147 10*3/uL — ABNORMAL LOW (ref 150–400)
RBC: 2.33 MIL/uL — ABNORMAL LOW (ref 3.87–5.11)
RBC: 2.42 MIL/uL — ABNORMAL LOW (ref 3.87–5.11)
RDW: 16.7 % — ABNORMAL HIGH (ref 11.5–15.5)
RDW: 16.6 % — ABNORMAL HIGH (ref 11.5–15.5)
WBC: 15.1 10*3/uL — ABNORMAL HIGH (ref 4.0–10.5)
WBC: 9.8 10*3/uL (ref 4.0–10.5)
nRBC: 0.2 % (ref 0.0–0.2)
nRBC: 0.3 % — ABNORMAL HIGH (ref 0.0–0.2)

## 2021-10-29 LAB — MAGNESIUM: Magnesium: 2.5 mg/dL — ABNORMAL HIGH (ref 1.7–2.4)

## 2021-10-29 MED ORDER — DOCUSATE SODIUM 100 MG PO CAPS
100.0000 mg | ORAL_CAPSULE | Freq: Two times a day (BID) | ORAL | Status: DC
  Administered 2021-10-29 – 2021-10-30 (×2): 100 mg via ORAL
  Filled 2021-10-29 (×2): qty 1

## 2021-10-29 MED ORDER — DICLOFENAC SODIUM 1 % EX GEL
2.0000 g | Freq: Four times a day (QID) | CUTANEOUS | Status: DC
Start: 2021-10-29 — End: 2021-10-30
  Administered 2021-10-29 – 2021-10-30 (×3): 2 g via TOPICAL
  Filled 2021-10-29: qty 100

## 2021-10-29 NOTE — Progress Notes (Addendum)
PROGRESS NOTE  Stacy Ayers VOH:[id number] DOB: 10-02-1941 DOA: 10/28/2021 PCP: [name], DO   LOS: 0 days   Brief narrative:  Stacy Ayers is a pleasant 80 y.o. female with past medical history of hypertension, hypothyroidism, depression, history of gastric bypass surgery, osteoporosis, status post left knee arthroplasty on 10/19/2021 presented to the hospital from skilled nursing facility with pain in her left knee and abdomen.  She was recently given Keflex for some cellulitis over the knee but then was requiring more oral Dilaudid for severe left knee pain.  In the ED patient was noted to be hypotensive with tachycardic.  EKG was unremarkable.  X-ray KUB was negative.  Ultrasound of the lower extremity was negative for DVT.  CTA chest was negative for PE.  Patient had mild hypokalemia on presentation with hemoglobin of 8.5 and mild thrombocytopenia.  Lactate was elevated to 4.4.  Blood cultures were done in the ED and patient was given 2 and half liters of normal saline IV vancomycin cefepime and Flagyl.  Orthopedics was consulted and patient was admitted hospital for further evaluation and treatment.    Assessment/Plan:  Principal Problem:   Lactic acidosis Active Problems:   Hypothyroidism   History of diet-controlled diabetes   Essential hypertension   Abdominal pain, generalized   Anemia   Depression, major, single episode, mild (HCC)   Left knee pain UTI  Lactic acidosis; hypotension with abnormal urinalysis and abdominal pain could be sepsis secondary to acute cystitis. Patient has met sepsis criteria on presentation patient was hypotensive with mild tachycardia with a lactate up to 4.4 with abnormal urinalysis.  Afebrile but has mild leukocytosis today.  CT scan of the chest abdomen without any obvious source of infection.  Follow blood cultures.  Patient received 2 and half liters of fluid in the ED.  Lactate has normalized after IV fluid resuscitation.  Continue  IV Rocephin.  Hold on losartan for now.  CTA of the chest, abdomen and pelvis negative for source of infection.   Left knee pain and redness; s/p TKA on 10/19/21   - S/p Lt TKA on 10/19/21, started Keflex at [facility] for possible surrounding cellulitis on 11/28.  Patient was requiring frequent doses of Dilaudid at the skilled nursing facility.  Patient does have mild leukocytosis but no fever.  Blood cultures negative in less than 12 hours.  Patient has been seen by orthopedics who stated that patient's wound is healing normally.  DVT scan negative.   Anemia; thrombocytopenia  - Hgb 8.5 and platelets 120k on admission, received IV fluid hydration.  Hemoglobin of 7.0 today.  Could be hemodilution.  We will continue to monitor closely.  Transfuse for hemoglobin less than 7.  No evidence of bleeding.  Platelet count of 125 today from 120.  Abdominal pain likely secondary to acute UTI.  CT scan of the abdomen was negative for acute findings.  Patient is afebrile.  It is within normal limits.  Urinalysis with large leukocyte and WBC at 21-50.  Possibility of UTI.  IV Rocephin on board.  Follow blood cultures, urine cultures.   History of essential hypertension. Blood pressure was low in the 80s in the ED.  Has improved with IV fluid hydration.  Latest blood pressure 115/52.  Continue to hold losartan.   Depression Continue Effexor.    Hypothyroidism  Continue Synthroid from home.  Pressure injury. Stage I. Present on admission. Will continue prevention protocol. Pressure Injury 10/29/21 Coccyx Medial Stage 1 -  Intact skin with non-blanchable redness of a localized area usually over a bony prominence. red (Active)  10/29/21 1259  Location: Coccyx  Location Orientation: Medial  Staging: Stage 1 -  Intact skin with non-blanchable redness of a localized area usually over a bony prominence.  Wound Description (Comments): red  Present on Admission: Yes     DVT prophylaxis: rivaroxaban (XARELTO) tablet  10 mg Start: 10/29/21 1000 rivaroxaban (XARELTO) tablet 10 mg    Code Status: Full code  Family Communication:  Spoke with the patient's daughter on the phone and updated her about the clinical situation of the patient .  Disposition.  Patient is from skilled nursing facility.  Plan is skilled nursing facility on discharge likely in 1-2 days.  Get PT OT evaluation. Will get TOC involved  Status is: Observation  The patient will require care spanning > 2 midnights and should be moved to inpatient because: Concern for sepsis elevated lactate, hypotension, follow blood cultures, urine cultures  Consultants: Orthopedics  Procedures: None  Anti-infectives:  Rocephin IV Vancomycin x1  Anti-infectives (From admission, onward)    Start     Dose/Rate Route Frequency Ordered Stop   10/28/21 2000  cefTRIAXone (ROCEPHIN) 1 g in sodium chloride 0.9 % 100 mL IVPB        1 g 200 mL/hr over 30 Minutes Intravenous Every 24 hours 10/28/21 1947 11/04/21 1944   10/28/21 1445  vancomycin (VANCOREADY) IVPB 1500 mg/300 mL        1,500 mg 150 mL/hr over 120 Minutes Intravenous  Once 10/28/21 1432 10/28/21 1854   10/28/21 1430  ceFEPIme (MAXIPIME) 2 g in sodium chloride 0.9 % 100 mL IVPB        2 g 200 mL/hr over 30 Minutes Intravenous  Once 10/28/21 1429 10/28/21 1633   10/28/21 1430  metroNIDAZOLE (FLAGYL) IVPB 500 mg        500 mg 100 mL/hr over 60 Minutes Intravenous  Once 10/28/21 1429 10/28/21 1855   10/28/21 1430  vancomycin (VANCOCIN) IVPB 1000 mg/200 mL premix  Status:  Discontinued        1,000 mg 200 mL/hr over 60 Minutes Intravenous  Once 10/28/21 1429 10/28/21 1432       Subjective: Today, patient was seen and examined at bedside.  Today patient states that she does not have any abdominal pain but has mild constipation.  No nausea or vomiting.  Complains of mild knee pain.  Objective: Vitals:   10/29/21 0530 10/29/21 0830  BP: 113/60 (!) 115/52  Pulse: 70 71  Resp: 15 13   Temp:    SpO2: 96% 100%   No intake or output data in the 24 hours ending 10/29/21 1025 Filed Weights   10/28/21 1230  Weight: 78 kg   Body mass index is 26.93 kg/m.   Physical Exam: GENERAL: Patient is alert awake and oriented. Not in obvious distress. HENT: No scleral pallor or icterus. Pupils equally reactive to light. Oral mucosa is moist NECK: is supple, no gross swelling noted. CHEST: Clear to auscultation. No crackles or wheezes.  Diminished breath sounds bilaterally. CVS: S1 and S2 heard, no murmur. Regular rate and rhythm.  ABDOMEN: Soft, non-tender, bowel sounds are present. EXTREMITIES: No edema.  Left knee surgery with mild erythema. CNS: Cranial nerves are intact. No focal motor deficits. SKIN: warm and dry without rashes.  Data Review: I have personally reviewed the following laboratory data and studies,  CBC: Recent Labs  Lab 10/23/21 0000 10/28/21 1309 10/29/21 0241  WBC 8.6 7.8 15.1*  NEUTROABS  --  7.1  --   HGB 7.9* 8.5* 7.0*  HCT 24* 27.9* 23.3*  MCV  --  99.6 100.0  PLT 266 120* 384*   Basic Metabolic Panel: Recent Labs  Lab 10/23/21 0000 10/28/21 1309 10/29/21 0241  NA 135* 134* 136  K 3.5 3.4* 4.9  CL 99 102 107  CO2 29* 22 23  GLUCOSE  --  153* 122*  BUN _0 CREATININE 0.6 0.95 0.68  CALCIUM 8.6* 8.4* 7.3*  MG  --   --  2.5*   Liver Function Tests: Recent Labs  Lab 10/28/21 1309 10/29/21 0241  AST 35 27  ALT 20 16  ALKPHOS 84 52  BILITOT 1.6* 0.9  PROT 6.6 5.7*  ALBUMIN 3.4* 3.0*   Recent Labs  Lab 10/28/21 1309  LIPASE 36   No results for input(s): AMMONIA in the last 168 hours. Cardiac Enzymes: No results for input(s): CKTOTAL, CKMB, CKMBINDEX, TROPONINI in the last 168 hours. BNP (last 3 results) No results for input(s): BNP in the last 8760 hours.  ProBNP (last 3 results) No results for input(s): PROBNP in the last 8760 hours.  CBG: No results for input(s): GLUCAP in the last 168 hours. Recent Results  (from the past 240 hour(s))  Resp Panel by RT-PCR (Flu A&B, Covid) Nasopharyngeal Swab     Status: None   Collection Time: 10/20/21 12:56 PM   Specimen: Nasopharyngeal Swab; Nasopharyngeal(NP) swabs in vial transport medium  Result Value Ref Range Status   SARS Coronavirus 2 by RT PCR NEGATIVE NEGATIVE Final    Comment: (NOTE) SARS-CoV-2 target nucleic acids are NOT DETECTED.  The SARS-CoV-2 RNA is generally detectable in upper respiratory specimens during the acute phase of infection. The lowest concentration of SARS-CoV-2 viral copies this assay can detect is 138 copies/mL. A negative result does not preclude SARS-Cov-2 infection and should not be used as the sole basis for treatment or other patient management decisions. A negative result may occur with  improper specimen collection/handling, submission of specimen other than nasopharyngeal swab, presence of viral mutation(s) within the areas targeted by this assay, and inadequate number of viral copies(<138 copies/mL). A negative result must be combined with clinical observations, patient history, and epidemiological information. The expected result is Negative.  Fact Sheet for Patients:  EntrepreneurPulse.com.au  Fact Sheet for Healthcare Providers:  IncredibleEmployment.be  This test is no t yet approved or cleared by the [place] FDA and  has been authorized for detection and/or diagnosis of SARS-CoV-2 by FDA under an Emergency Use Authorization (EUA). This EUA will remain  in effect (meaning this test can be used) for the duration of the COVID-19 declaration under Section 564(b)(1) of the Act, 21 U.S.C.section 360bbb-3(b)(1), unless the authorization is terminated  or revoked sooner.       Influenza A by PCR NEGATIVE NEGATIVE Final   Influenza B by PCR NEGATIVE NEGATIVE Final    Comment: (NOTE) The Xpert Xpress SARS-CoV-2/FLU/RSV plus assay is intended as an aid in the  diagnosis of influenza from Nasopharyngeal swab specimens and should not be used as a sole basis for treatment. Nasal washings and aspirates are unacceptable for Xpert Xpress SARS-CoV-2/FLU/RSV testing.  Fact Sheet for Patients: EntrepreneurPulse.com.au  Fact Sheet for Healthcare Providers: IncredibleEmployment.be  This test is not yet approved or cleared by the [place] FDA and has been authorized for detection and/or diagnosis of SARS-CoV-2 by FDA under an Emergency Use Authorization (EUA). This  EUA will remain in effect (meaning this test can be used) for the duration of the COVID-19 declaration under Section 564(b)(1) of the Act, 21 U.S.C. section 360bbb-3(b)(1), unless the authorization is terminated or revoked.  Performed at [facility], [place] [address], [place], [place] [address]   Resp Panel by RT-PCR (Flu A&B, Covid) Nasopharyngeal Swab     Status: None   Collection Time: 10/28/21  1:09 PM   Specimen: Nasopharyngeal Swab; Nasopharyngeal(NP) swabs in vial transport medium  Result Value Ref Range Status   SARS Coronavirus 2 by RT PCR NEGATIVE NEGATIVE Final    Comment: (NOTE) SARS-CoV-2 target nucleic acids are NOT DETECTED.  The SARS-CoV-2 RNA is generally detectable in upper respiratory specimens during the acute phase of infection. The lowest concentration of SARS-CoV-2 viral copies this assay can detect is 138 copies/mL. A negative result does not preclude SARS-Cov-2 infection and should not be used as the sole basis for treatment or other patient management decisions. A negative result may occur with  improper specimen collection/handling, submission of specimen other than nasopharyngeal swab, presence of viral mutation(s) within the areas targeted by this assay, and inadequate number of viral copies(<138 copies/mL). A negative result must be combined with clinical observations, patient history, and  epidemiological information. The expected result is Negative.  Fact Sheet for Patients:  EntrepreneurPulse.com.au  Fact Sheet for Healthcare Providers:  IncredibleEmployment.be  This test is no t yet approved or cleared by the [place] FDA and  has been authorized for detection and/or diagnosis of SARS-CoV-2 by FDA under an Emergency Use Authorization (EUA). This EUA will remain  in effect (meaning this test can be used) for the duration of the COVID-19 declaration under Section 564(b)(1) of the Act, 21 U.S.C.section 360bbb-3(b)(1), unless the authorization is terminated  or revoked sooner.       Influenza A by PCR NEGATIVE NEGATIVE Final   Influenza B by PCR NEGATIVE NEGATIVE Final    Comment: (NOTE) The Xpert Xpress SARS-CoV-2/FLU/RSV plus assay is intended as an aid in the diagnosis of influenza from Nasopharyngeal swab specimens and should not be used as a sole basis for treatment. Nasal washings and aspirates are unacceptable for Xpert Xpress SARS-CoV-2/FLU/RSV testing.  Fact Sheet for Patients: EntrepreneurPulse.com.au  Fact Sheet for Healthcare Providers: IncredibleEmployment.be  This test is not yet approved or cleared by the [place] FDA and has been authorized for detection and/or diagnosis of SARS-CoV-2 by FDA under an Emergency Use Authorization (EUA). This EUA will remain in effect (meaning this test can be used) for the duration of the COVID-19 declaration under Section 564(b)(1) of the Act, 21 U.S.C. section 360bbb-3(b)(1), unless the authorization is terminated or revoked.  Performed at [facility], [place] [address], [place], [place] [address]   Blood culture (routine x 2)     Status: None (Preliminary result)   Collection Time: 10/28/21  2:43 PM   Specimen: BLOOD  Result Value Ref Range Status   Specimen Description   Final    BLOOD LEFT  ANTECUBITAL Performed at [place] [address], [place], [place] [address]    Special Requests   Final    BOTTLES DRAWN AEROBIC AND ANAEROBIC Blood Culture adequate volume Performed at [facility] [address] [place], [place] [address]    Culture   Final    NO GROWTH < 12 HOURS Performed at [place] [address], [place], [place] [address]    Report  Status PENDING  Incomplete  Blood culture (routine x 2)     Status: None (Preliminary result)   Collection Time: 10/28/21  2:52 PM   Specimen: BLOOD  Result Value Ref Range Status   Specimen Description   Final    BLOOD RIGHT ANTECUBITAL Performed at [place] [address] [place], [place] [address]    Special Requests   Final    BOTTLES DRAWN AEROBIC AND ANAEROBIC Blood Culture adequate volume Performed at [facility] [address], [place], [place] [address]    Culture   Final    NO GROWTH < 12 HOURS Performed at [facility] [address], [place], [place] [address]    Report Status PENDING  Incomplete     Studies: DG Abdomen 1 View  Result Date: 10/28/2021 CLINICAL DATA:  Abdominal pain. EXAM: ABDOMEN - 1 VIEW COMPARISON:  None. FINDINGS: The bowel gas pattern is normal. No radio-opaque calculi or other significant radiographic abnormality are seen. IMPRESSION: Negative. Electronically Signed   By: Marijo Conception M.D.   On: 10/28/2021 15:39   CT Angio Chest Pulmonary Embolism (PE) W or WO Contrast  Result Date: 10/28/2021 CLINICAL DATA:  Suspected bowel obstruction, chest pain and shortness of breath. 80 year old female. EXAM: CT ANGIOGRAPHY CHEST CT ABDOMEN AND PELVIS WITH CONTRAST TECHNIQUE: Multidetector CT imaging of the chest was performed using the standard protocol during bolus administration of intravenous contrast. Multiplanar CT image reconstructions and MIPs were obtained to evaluate the vascular  anatomy. Multidetector CT imaging of the abdomen and pelvis was performed using the standard protocol during bolus administration of intravenous contrast. CONTRAST:  2m OMNIPAQUE IOHEXOL 350 MG/ML SOLN COMPARISON:  August 12, 2008 chest CT. FINDINGS: CTA CHEST FINDINGS Cardiovascular: Scattered aortic atherosclerosis. Normal caliber of the thoracic aorta. Normal caliber of the main pulmonary arteries. Main pulmonary artery is moderately well opacified at 245 Hounsfield units. There is some motion at the lung bases which does limit assessment. No signs of pulmonary embolism to the segmental level. Mediastinum/Nodes: No thoracic inlet, axillary, mediastinal or hilar adenopathy. Esophagus grossly normal. Lungs/Pleura: Basilar atelectasis. No effusion. No consolidative process. Musculoskeletal: No acute bone finding. No destructive bone process. Spinal degenerative changes. See below for full musculoskeletal detail about the abdomen and pelvis. Review of the MIP images confirms the above findings. CT ABDOMEN and PELVIS FINDINGS Hepatobiliary: Liver contour is lobular with fissural widening. Post cholecystectomy. No biliary duct distension. Portal vein is patent. No focal, suspicious hepatic lesion. Pancreas: Normal contour without signs of inflammation. Spleen: Top normal size, no visible lesion. Adrenals/Urinary Tract: Adrenal glands are normal. Symmetric renal enhancement without hydronephrosis. No nephrolithiasis. Small amount of gas in the urinary bladder. Small cysts in the bilateral kidneys. 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 Stomach/Bowel: Signs of gastric bypass procedure. Ingested tablet seen in the dependent portion of the gastric pouch does not changed density from arterial to venous phase or change in size. Small hiatal hernia. Jejunojejunostomy in the LEFT upper quadrant. No dilation of the E Ferrante limb or substantial dilation of the excluded stomach. No acute gastrointestinal  process. Appendix not visualized though there are no secondary signs to suggest acute appendicitis. Vascular/Lymphatic: Aortic atherosclerosis. No sign of aneurysm. Smooth contour of the IVC. There is no gastrohepatic or hepatoduodenal ligament lymphadenopathy. No retroperitoneal or mesenteric lymphadenopathy. No pelvic sidewall lymphadenopathy. Atherosclerotic changes are mild. Reproductive: Unremarkable. Other: Small midline ventral hernia above the umbilicus contains fat only. No ascites.  No free air. Musculoskeletal: No acute bone finding. No destructive bone process. Spinal degenerative changes. Degenerative changes are moderate to marked and worse towards the lower lumbar spine. Review of the MIP images confirms the above findings. IMPRESSION: Negative for pulmonary embolism to the segmental level. Mildly compromised bolus timing and respiratory motion with some limitation. Small amount of gas in the urinary bladder. Correlate with recent instrumentation. Signs of gastric bypass procedure without signs of obstruction or acute bowel process. Small hiatal hernia. Small midline ventral hernia above the umbilicus contains only fat. Signs of potential liver disease with lobular hepatic contours. 1 cm RIGHT renal AML. Aortic Atherosclerosis (ICD10-I70.0). Electronically Signed   By: Zetta Bills M.D.   On: 10/28/2021 17:34   CT ABDOMEN PELVIS W CONTRAST  Result Date: 10/28/2021 CLINICAL DATA:  Suspected bowel obstruction, chest pain and shortness of breath. 80 year old female. EXAM: CT ANGIOGRAPHY CHEST CT ABDOMEN AND PELVIS WITH CONTRAST TECHNIQUE: Multidetector CT imaging of the chest was performed using the standard protocol during bolus administration of intravenous contrast. Multiplanar CT image reconstructions and MIPs were obtained to evaluate the vascular anatomy. Multidetector CT imaging of the abdomen and pelvis was performed using the standard protocol during bolus administration of intravenous  contrast. CONTRAST:  6m OMNIPAQUE IOHEXOL 350 MG/ML SOLN COMPARISON:  August 12, 2008 chest CT. FINDINGS: CTA CHEST FINDINGS Cardiovascular: Scattered aortic atherosclerosis. Normal caliber of the thoracic aorta. Normal caliber of the main pulmonary arteries. Main pulmonary artery is moderately well opacified at 245 Hounsfield units. There is some motion at the lung bases which does limit assessment. No signs of pulmonary embolism to the segmental level. Mediastinum/Nodes: No thoracic inlet, axillary, mediastinal or hilar adenopathy. Esophagus grossly normal. Lungs/Pleura: Basilar atelectasis. No effusion. No consolidative process. Musculoskeletal: No acute bone finding. No destructive bone process. Spinal degenerative changes. See below for full musculoskeletal detail about the abdomen and pelvis. Review of the MIP images confirms the above findings. CT ABDOMEN and PELVIS FINDINGS Hepatobiliary: Liver contour is lobular with fissural widening. Post cholecystectomy. No biliary duct distension. Portal vein is patent. No focal, suspicious hepatic lesion. Pancreas: Normal contour without signs of inflammation. Spleen: Top normal size, no visible lesion. Adrenals/Urinary Tract: Adrenal glands are normal. Symmetric renal enhancement without hydronephrosis. No nephrolithiasis. Small amount of gas in the urinary bladder. Small cysts in the bilateral kidneys. 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 Stomach/Bowel: Signs of gastric bypass procedure. Ingested tablet seen in the dependent portion of the gastric pouch does not changed density from arterial to venous phase or change in size. Small hiatal hernia. Jejunojejunostomy in the LEFT upper quadrant. No dilation of the E Ferrante limb or substantial dilation of the excluded stomach. No acute gastrointestinal process. Appendix not visualized though there are no secondary signs to suggest acute appendicitis. Vascular/Lymphatic: Aortic atherosclerosis. No sign  of aneurysm. Smooth contour of the IVC. There is no gastrohepatic or hepatoduodenal ligament lymphadenopathy. No retroperitoneal or mesenteric lymphadenopathy. No pelvic sidewall lymphadenopathy. Atherosclerotic changes are mild. Reproductive: Unremarkable. Other: Small midline ventral hernia above the umbilicus contains fat only. No ascites. No free air. Musculoskeletal: No acute bone finding. No destructive bone process. Spinal degenerative changes. Degenerative changes are moderate to marked and worse towards the lower lumbar spine. Review of the MIP images confirms the above findings. IMPRESSION: Negative for pulmonary embolism to the segmental level. Mildly compromised bolus timing and respiratory motion with some limitation. Small amount of gas in the urinary bladder. Correlate with recent instrumentation. Signs of gastric  bypass procedure without signs of obstruction or acute bowel process. Small hiatal hernia. Small midline ventral hernia above the umbilicus contains only fat. Signs of potential liver disease with lobular hepatic contours. 1 cm RIGHT renal AML. Aortic Atherosclerosis (ICD10-I70.0). Electronically Signed   By: Zetta Bills M.D.   On: 10/28/2021 17:34   DG Chest Portable 1 View  Result Date: 10/28/2021 CLINICAL DATA:  Abdominal pain and chest pain. Previous gastric bypass surgery. EXAM: PORTABLE CHEST 1 VIEW COMPARISON:  08/05/2010 FINDINGS: Mild cardiac enlargement. Tortuous aorta. The lungs are clear except for mild scarring or atelectasis at the left lung base. The vascularity is normal. No effusions. Previous shoulder replacement on the right. IMPRESSION: Mild cardiomegaly. Aortic atherosclerosis. Mild atelectasis or scarring at the left lung base. Electronically Signed   By: Nelson Chimes M.D.   On: 10/28/2021 15:39   VAS [place] LOWER EXTREMITY VENOUS (DVT) (7a-7p)  Result Date: 10/28/2021  Lower Venous DVT Study Patient Name:  MARIELOUISE AMEY  Date of Exam:   10/28/2021 Medical Rec  #: [id number]      Accession #:    [id number] Date of Birth: 19-May-1941      Patient Gender: F Patient Age:   33 years Exam Location:  [facility] Procedure:      VAS [place] LOWER EXTREMITY VENOUS (DVT) Referring Phys: JON KNAPP --------------------------------------------------------------------------------  Indications: Pain, and Swelling.  Risk Factors: Surgery Left knee replacement 10/19/21. Comparison Study: No previous exams Performing Technologist: Jody Hill RVT, RDMS  Examination Guidelines: A complete evaluation includes B-mode imaging, spectral Doppler, color Doppler, and power Doppler as needed of all accessible portions of each vessel. Bilateral testing is considered an integral part of a complete examination. Limited examinations for reoccurring indications may be performed as noted. The reflux portion of the exam is performed with the patient in reverse Trendelenburg.  +-----+---------------+---------+-----------+----------+--------------+ RIGHTCompressibilityPhasicitySpontaneityPropertiesThrombus Aging +-----+---------------+---------+-----------+----------+--------------+ CFV  Full           Yes      Yes                                 +-----+---------------+---------+-----------+----------+--------------+   +---------+---------------+---------+-----------+----------+--------------+ LEFT     CompressibilityPhasicitySpontaneityPropertiesThrombus Aging +---------+---------------+---------+-----------+----------+--------------+ CFV      Full           Yes      Yes                                 +---------+---------------+---------+-----------+----------+--------------+ SFJ      Full                                                        +---------+---------------+---------+-----------+----------+--------------+ FV Prox  Full           Yes      Yes                                 +---------+---------------+---------+-----------+----------+--------------+ FV  Mid   Full           Yes      Yes                                 +---------+---------------+---------+-----------+----------+--------------+  FV DistalFull           Yes      Yes                                 +---------+---------------+---------+-----------+----------+--------------+ PFV      Full                                                        +---------+---------------+---------+-----------+----------+--------------+ POP      Full           Yes      Yes                                 +---------+---------------+---------+-----------+----------+--------------+ PTV      Full                                                        +---------+---------------+---------+-----------+----------+--------------+ PERO     Full                                                        +---------+---------------+---------+-----------+----------+--------------+    Summary: RIGHT: - No evidence of common femoral vein obstruction.  LEFT: - There is no evidence of deep vein thrombosis in the lower extremity. - There is no evidence of superficial venous thrombosis.  - No cystic structure found in the popliteal fossa. Subcutaneous irregularity noted by the sonographer in area of knee and calf.  *See table(s) above for measurements and observations. Electronically signed by Jamelle Haring on 10/28/2021 at 4:05:52 PM.    Final      Flora Lipps, MD  [facility] 10/29/2021  If 7PM-7AM, please contact night-coverage

## 2021-10-29 NOTE — ED Notes (Signed)
Pt complaining of a "sore" on her bottom that was present before admission. RN assessed area and noticed redness. Barrier cream and sacral pad applied.

## 2021-10-29 NOTE — Progress Notes (Signed)
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 Has recently been placed on Keflex by [facility] physician for possible cellulitis surrounding the bandage. We were consulted by ED for evaluation of her knee.   Objective: Vital signs in last 24 hours: Temp:  [98.3 F (36.8 C)] 98.3 F (36.8 C) (11/30 1239) Pulse Rate:  [70-106] 70 (12/01 0530) Resp:  [10-20] 15 (12/01 0530) BP: (87-136)/(37-90) 113/60 (12/01 0530) SpO2:  [90 %-100 %] 96 % (12/01 0530) Weight:  [78 kg] 78 kg (11/30 1230)  Intake/Output from previous day: No intake or output data in the 24 hours ending 10/29/21 0727  Intake/Output this shift: No intake/output data recorded.  Labs: Recent Labs    10/28/21 1309 10/29/21 0241  HGB 8.5* 7.0*   Recent Labs    10/28/21 1309 10/29/21 0241  WBC 7.8 15.1*  RBC 2.80* 2.33*  HCT 27.9* 23.3*  PLT 120* 125*   Recent Labs    10/28/21 1309 10/29/21 0241  NA 134* 136  K 3.4* 4.9  CL 102 107  CO2 22 23  BUN 12 12  CREATININE 0.95 0.68  GLUCOSE 153* 122*  CALCIUM 8.4* 7.3*   Recent Labs    10/28/21 1332  INR 1.9*    Exam: General - Patient is Alert and Oriented Left Knee - Neurologically intact Neurovascular intact Intact pulses distally Dorsiflexion/Plantar flexion intact Compartment soft Moderate swellng present No erythema Normal temperature upon palpation Dressing/Incision - clean, dry, no drainage Motor Function - intact, moving foot and toes well on exam.   Past Medical History:  Diagnosis Date   Abnormal liver function tests    Allergy    Anxiety    Arthritis    knees, lower back    Asthma    Cancer ([place])    Cataract    left eye growing cataract    Depression    Diabetes mellitus    had gastric bypass DM resolved-    Diverticulosis    GERD (gastroesophageal reflux  disease)    Headache    Previous migraines   Heart murmur    History of colon polyps    adenomatous   Hyperlipidemia    past hx- resloved after gastric bypass    Hypertension    Hypothyroidism    IBS (irritable bowel syndrome)    Iron deficiency anemia    Osteoporosis     Assessment/Plan:    Principal Problem:   Lactic acidosis Active Problems:   Hypothyroidism   History of diet-controlled diabetes   Essential hypertension   Abdominal pain, generalized   Anemia   Depression, major, single episode, mild (HCC)   Left knee pain  Estimated body mass index is 26.93 kg/m as calculated from the following:   Height as of this encounter: 5\' 7"  (1.702 m).   Weight as of this encounter: 78 kg.   DVT Prophylaxis - Xarelto and TED hose Weight-bearing as tolerated  Patient's knee upon exam reveals normal swelling for POD#10.  No signs of infectious process.Surrounding area soft, nontender to palpation.  Continue pain control regimen as prescribed.  Follow recommendations of hospitalist regarding constipation/abdominal pain.  Knee appears normal for this point in the recovery process.   Patient to follow up in clinic next week for previously scheduled routine post-operative visit.   Fenton Foy,  MBA, PA-C Orthopedic Surgery [phone] 10/29/2021, 7:27 AM

## 2021-10-30 DIAGNOSIS — F320 Major depressive disorder, single episode, mild: Secondary | ICD-10-CM | POA: Diagnosis not present

## 2021-10-30 DIAGNOSIS — Z8639 Personal history of other endocrine, nutritional and metabolic disease: Secondary | ICD-10-CM | POA: Diagnosis not present

## 2021-10-30 DIAGNOSIS — R1084 Generalized abdominal pain: Secondary | ICD-10-CM | POA: Diagnosis not present

## 2021-10-30 DIAGNOSIS — L89301 Pressure ulcer of unspecified buttock, stage 1: Secondary | ICD-10-CM | POA: Diagnosis not present

## 2021-10-30 DIAGNOSIS — E872 Acidosis, unspecified: Secondary | ICD-10-CM | POA: Diagnosis not present

## 2021-10-30 DIAGNOSIS — E890 Postprocedural hypothyroidism: Secondary | ICD-10-CM | POA: Diagnosis not present

## 2021-10-30 DIAGNOSIS — M25562 Pain in left knee: Secondary | ICD-10-CM | POA: Diagnosis not present

## 2021-10-30 DIAGNOSIS — D649 Anemia, unspecified: Secondary | ICD-10-CM | POA: Diagnosis not present

## 2021-10-30 DIAGNOSIS — I10 Essential (primary) hypertension: Secondary | ICD-10-CM | POA: Diagnosis not present

## 2021-10-30 LAB — BASIC METABOLIC PANEL
Anion gap: 7 (ref 5–15)
BUN: 10 mg/dL (ref 8–23)
CO2: 23 mmol/L (ref 22–32)
Calcium: 7.7 mg/dL — ABNORMAL LOW (ref 8.9–10.3)
Chloride: 105 mmol/L (ref 98–111)
Creatinine, Ser: 0.53 mg/dL (ref 0.44–1.00)
GFR, Estimated: >60 mL/min (ref >60)
Glucose, Bld: 105 mg/dL — ABNORMAL HIGH (ref 70–99)
Potassium: 4 mmol/L (ref 3.5–5.1)
Sodium: 135 mmol/L (ref 135–145)

## 2021-10-30 LAB — URINE CULTURE: Culture: <10000 — AB

## 2021-10-30 LAB — MAGNESIUM: Magnesium: 2.4 mg/dL (ref 1.7–2.4)

## 2021-10-30 LAB — CBC
HCT: 23.2 % — ABNORMAL LOW (ref 36.0–46.0)
Hemoglobin: 7.1 g/dL — ABNORMAL LOW (ref 12.0–15.0)
MCH: 30.2 pg (ref 26.0–34.0)
MCHC: 30.6 g/dL (ref 30.0–36.0)
MCV: 98.7 fL (ref 80.0–100.0)
Platelets: 154 10*3/uL (ref 150–400)
RBC: 2.35 MIL/uL — ABNORMAL LOW (ref 3.87–5.11)
RDW: 16.6 % — ABNORMAL HIGH (ref 11.5–15.5)
WBC: 6.7 10*3/uL (ref 4.0–10.5)
nRBC: 0.4 % — ABNORMAL HIGH (ref 0.0–0.2)

## 2021-10-30 MED ORDER — CEFDINIR 300 MG PO CAPS: 300.0000 mg | ORAL_CAPSULE | Freq: Two times a day (BID) | ORAL | 0 refills | Status: DC

## 2021-10-30 MED ORDER — HYDROMORPHONE HCL 2 MG PO TABS: 2.0000 mg | ORAL_TABLET | Freq: Four times a day (QID) | ORAL | 0 refills | Status: DC | PRN

## 2021-10-30 MED ORDER — DICLOFENAC SODIUM 1 % EX GEL: 2.0000 g | Freq: Four times a day (QID) | CUTANEOUS | Status: DC

## 2021-10-30 NOTE — Discharge Summary (Addendum)
Physician Discharge Summary  Stacy Ayers LNL:[id number] DOB: 08/04/1941 DOA: 10/28/2021  PCP: [name], DO  Admit date: 10/28/2021 Discharge date: 10/30/2021  Admitted From: SNF  Discharge disposition: SNF  Recommendations for Outpatient Follow-Up:   Follow up with your primary care provider at the skilled nursing facility in 3 to 5 days. Follow-up with orthopedics as has been scheduled. Check CBC, BMP, magnesium in the next visit  Discharge Diagnosis:   Principal Problem:   Lactic acidosis Active Problems:   Hypothyroidism   History of diet-controlled diabetes   Essential hypertension   Abdominal pain, generalized   Anemia   Depression, major, single episode, mild (HCC)   Left knee pain   Pressure injury of skin Lower UTI.   Sepsis  Discharge Condition: Improved.  Diet recommendation: Low sodium, heart healthy.  Carbohydrate-modified.    Wound care:  Local knee care  Code status: Full.   History of Present Illness:    Stacy Ayers is a 80 y.o. female with past medical history of hypertension, hypothyroidism, depression, history of gastric bypass surgery, osteoporosis, status post left knee arthroplasty on 10/19/2021 presented to the hospital from skilled nursing facility with pain in her left knee and abdomen.  She was recently given Keflex for some cellulitis over the knee but then was requiring more oral Dilaudid for severe left knee pain.  In the ED, patient was noted to be hypotensive with tachycardic.  EKG was unremarkable.  X-ray KUB was negative.  Ultrasound of the lower extremity was negative for DVT.  CTA chest was negative for PE.  Patient had mild hypokalemia on presentation with hemoglobin of 8.5 and mild thrombocytopenia.  Lactate was elevated to 4.4.  Blood cultures were done in the ED and patient was given 2 and half liters of normal saline, IV vancomycin cefepime and Flagyl.  Orthopedics was consulted and patient was admitted hospital for  further evaluation and treatment.    Hospital Course:   Following conditions were addressed during hospitalization as listed below,  Lactic acidosis; hypotension with abnormal urinalysis and abdominal pain could be sepsis secondary to acute cystitis. Afebrile and no leukocytosis today.  Symptoms have improved.  CT scan of the chest abdomen without any obvious source of infection.  Blood cultures negative in 2 days.   CTA of the chest, abdomen and pelvis negative for source of infection.  Patient will be given a few more days of omnicef on discharge to complete the course   Left knee pain and redness; s/p TKA on 10/19/21   - S/p Lt TKA on 10/19/21, started Keflex at [facility] for possible surrounding cellulitis on 10/26/21.  Patient was requiring frequent doses of Dilaudid at the skilled nursing facility.  Patient has been seen by orthopedics who stated that patient's wound is healing normally.  DVT scan was negative.  Courage to take a Dilaudid prior to PT intervention.   Anemia; thrombocytopenia  On presentation.  Patient does have chronic anemia.  Hemoglobin was 8.5 on presentation which is 7.1 today but had received plenty of fluids.  No evidence of bleeding.  Patient is on an iron supplement which will be continued on discharge.  Spoke with the patient about it.  Will need a repeat CBC in few days.  Platelets have improved and normalized at this time at 154.  Abdominal pain likely secondary to acute UTI.  CT scan of the abdomen was negative for acute findings.  Patient is afebrile.   Urinalysis showed large  leukocyte and WBC at 21-50.  Received IV Rocephin during hospitalization.  Will be continued on oral antibiotics on discharge to complete the course  History of essential hypertension. Blood pressure was low in the 80s in the ED.  Has improved with IV fluid hydration.  Patient will be resumed on home antihypertensives on discharge.   Depression Continue Effexor.    Hypothyroidism  Continue  Synthroid from home.   Pressure injury. Stage I. Present on admission.  Recommend to  continue prevention protocol.  Disposition.  At this time, patient is stable for disposition to skilled nursing facility for continuation of rehab.  Medical Consultants:   Orthopedics.  Procedures:    None Subjective:   Today, patient was seen and examined at bedside.  Denies any nausea vomiting fever or chills.  Has mild knee discomfort.  No further abdominal pain  Discharge Exam:   Vitals:   10/29/21 2012 10/30/21 0557  BP: 136/66 (!) 132/58  Pulse: 70 73  Resp: 16 16  Temp: 98.3 F (36.8 C) 98.6 F (37 C)  SpO2: 99% 97%   Vitals:   10/29/21 1649 10/29/21 1712 10/29/21 2012 10/30/21 0557  BP:  134/67 136/66 (!) 132/58  Pulse:  70 70 73  Resp: 13 15 16 16   Temp:  98.3 F (36.8 C) 98.3 F (36.8 C) 98.6 F (37 C)  TempSrc:  Oral Oral Oral  SpO2:  100% 99% 97%  Weight:      Height:       General: Alert awake, not in obvious distress HENT: pupils equally reacting to light,  No scleral pallor or icterus noted. Oral mucosa is moist.  Chest:  Clear breath sounds.  Diminished breath sounds bilaterally. No crackles or wheezes.  CVS: S1 &S2 heard. No murmur.  Regular rate and rhythm. Abdomen: Soft, nontender, nondistended.  Bowel sounds are heard.   Extremities: No cyanosis, clubbing or edema.  Peripheral pulses are palpable.  Left knee recent surgical scar with mild erythema Psych: Alert, awake and oriented, normal mood CNS:  No cranial nerve deficits.  Power equal in all extremities.   Skin: Warm and dry.  No rashes noted.  The results of significant diagnostics from this hospitalization (including imaging, microbiology, ancillary and laboratory) are listed below for reference.     Diagnostic Studies:   DG Abdomen 1 View  Result Date: 10/28/2021 CLINICAL DATA:  Abdominal pain. EXAM: ABDOMEN - 1 VIEW COMPARISON:  None. FINDINGS: The bowel gas pattern is normal. No radio-opaque  calculi or other significant radiographic abnormality are seen. IMPRESSION: Negative. Electronically Signed   By: Marijo Conception M.D.   On: 10/28/2021 15:39   CT Angio Chest Pulmonary Embolism (PE) W or WO Contrast  Result Date: 10/28/2021 CLINICAL DATA:  Suspected bowel obstruction, chest pain and shortness of breath. 80 year old female. EXAM: CT ANGIOGRAPHY CHEST CT ABDOMEN AND PELVIS WITH CONTRAST TECHNIQUE: Multidetector CT imaging of the chest was performed using the standard protocol during bolus administration of intravenous contrast. Multiplanar CT image reconstructions and MIPs were obtained to evaluate the vascular anatomy. Multidetector CT imaging of the abdomen and pelvis was performed using the standard protocol during bolus administration of intravenous contrast. CONTRAST:  23mL OMNIPAQUE IOHEXOL 350 MG/ML SOLN COMPARISON:  August 12, 2008 chest CT. FINDINGS: CTA CHEST FINDINGS Cardiovascular: Scattered aortic atherosclerosis. Normal caliber of the thoracic aorta. Normal caliber of the main pulmonary arteries. Main pulmonary artery is moderately well opacified at 245 Hounsfield units. There is some motion at the lung  bases which does limit assessment. No signs of pulmonary embolism to the segmental level. Mediastinum/Nodes: No thoracic inlet, axillary, mediastinal or hilar adenopathy. Esophagus grossly normal. Lungs/Pleura: Basilar atelectasis. No effusion. No consolidative process. Musculoskeletal: No acute bone finding. No destructive bone process. Spinal degenerative changes. See below for full musculoskeletal detail about the abdomen and pelvis. Review of the MIP images confirms the above findings. CT ABDOMEN and PELVIS FINDINGS Hepatobiliary: Liver contour is lobular with fissural widening. Post cholecystectomy. No biliary duct distension. Portal vein is patent. No focal, suspicious hepatic lesion. Pancreas: Normal contour without signs of inflammation. Spleen: Top normal size, no visible  lesion. Adrenals/Urinary Tract: Adrenal glands are normal. Symmetric renal enhancement without hydronephrosis. No nephrolithiasis. Small amount of gas in the urinary bladder. Small cysts in the bilateral kidneys. 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 Stomach/Bowel: Signs of gastric bypass procedure. Ingested tablet seen in the dependent portion of the gastric pouch does not changed density from arterial to venous phase or change in size. Small hiatal hernia. Jejunojejunostomy in the LEFT upper quadrant. No dilation of the E Ferrante limb or substantial dilation of the excluded stomach. No acute gastrointestinal process. Appendix not visualized though there are no secondary signs to suggest acute appendicitis. Vascular/Lymphatic: Aortic atherosclerosis. No sign of aneurysm. Smooth contour of the IVC. There is no gastrohepatic or hepatoduodenal ligament lymphadenopathy. No retroperitoneal or mesenteric lymphadenopathy. No pelvic sidewall lymphadenopathy. Atherosclerotic changes are mild. Reproductive: Unremarkable. Other: Small midline ventral hernia above the umbilicus contains fat only. No ascites. No free air. Musculoskeletal: No acute bone finding. No destructive bone process. Spinal degenerative changes. Degenerative changes are moderate to marked and worse towards the lower lumbar spine. Review of the MIP images confirms the above findings. IMPRESSION: Negative for pulmonary embolism to the segmental level. Mildly compromised bolus timing and respiratory motion with some limitation. Small amount of gas in the urinary bladder. Correlate with recent instrumentation. Signs of gastric bypass procedure without signs of obstruction or acute bowel process. Small hiatal hernia. Small midline ventral hernia above the umbilicus contains only fat. Signs of potential liver disease with lobular hepatic contours. 1 cm RIGHT renal AML. Aortic Atherosclerosis (ICD10-I70.0). Electronically Signed   By: Zetta Bills M.D.   On: 10/28/2021 17:34   CT ABDOMEN PELVIS W CONTRAST  Result Date: 10/28/2021 CLINICAL DATA:  Suspected bowel obstruction, chest pain and shortness of breath. 80 year old female. EXAM: CT ANGIOGRAPHY CHEST CT ABDOMEN AND PELVIS WITH CONTRAST TECHNIQUE: Multidetector CT imaging of the chest was performed using the standard protocol during bolus administration of intravenous contrast. Multiplanar CT image reconstructions and MIPs were obtained to evaluate the vascular anatomy. Multidetector CT imaging of the abdomen and pelvis was performed using the standard protocol during bolus administration of intravenous contrast. CONTRAST:  45mL OMNIPAQUE IOHEXOL 350 MG/ML SOLN COMPARISON:  August 12, 2008 chest CT. FINDINGS: CTA CHEST FINDINGS Cardiovascular: Scattered aortic atherosclerosis. Normal caliber of the thoracic aorta. Normal caliber of the main pulmonary arteries. Main pulmonary artery is moderately well opacified at 245 Hounsfield units. There is some motion at the lung bases which does limit assessment. No signs of pulmonary embolism to the segmental level. Mediastinum/Nodes: No thoracic inlet, axillary, mediastinal or hilar adenopathy. Esophagus grossly normal. Lungs/Pleura: Basilar atelectasis. No effusion. No consolidative process. Musculoskeletal: No acute bone finding. No destructive bone process. Spinal degenerative changes. See below for full musculoskeletal detail about the abdomen and pelvis. Review of the MIP images confirms the above findings. CT ABDOMEN and PELVIS FINDINGS  Hepatobiliary: Liver contour is lobular with fissural widening. Post cholecystectomy. No biliary duct distension. Portal vein is patent. No focal, suspicious hepatic lesion. Pancreas: Normal contour without signs of inflammation. Spleen: Top normal size, no visible lesion. Adrenals/Urinary Tract: Adrenal glands are normal. Symmetric renal enhancement without hydronephrosis. No nephrolithiasis. Small amount of gas  in the urinary bladder. Small cysts in the bilateral kidneys. 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 Stomach/Bowel: Signs of gastric bypass procedure. Ingested tablet seen in the dependent portion of the gastric pouch does not changed density from arterial to venous phase or change in size. Small hiatal hernia. Jejunojejunostomy in the LEFT upper quadrant. No dilation of the E Ferrante limb or substantial dilation of the excluded stomach. No acute gastrointestinal process. Appendix not visualized though there are no secondary signs to suggest acute appendicitis. Vascular/Lymphatic: Aortic atherosclerosis. No sign of aneurysm. Smooth contour of the IVC. There is no gastrohepatic or hepatoduodenal ligament lymphadenopathy. No retroperitoneal or mesenteric lymphadenopathy. No pelvic sidewall lymphadenopathy. Atherosclerotic changes are mild. Reproductive: Unremarkable. Other: Small midline ventral hernia above the umbilicus contains fat only. No ascites. No free air. Musculoskeletal: No acute bone finding. No destructive bone process. Spinal degenerative changes. Degenerative changes are moderate to marked and worse towards the lower lumbar spine. Review of the MIP images confirms the above findings. IMPRESSION: Negative for pulmonary embolism to the segmental level. Mildly compromised bolus timing and respiratory motion with some limitation. Small amount of gas in the urinary bladder. Correlate with recent instrumentation. Signs of gastric bypass procedure without signs of obstruction or acute bowel process. Small hiatal hernia. Small midline ventral hernia above the umbilicus contains only fat. Signs of potential liver disease with lobular hepatic contours. 1 cm RIGHT renal AML. Aortic Atherosclerosis (ICD10-I70.0). Electronically Signed   By: Zetta Bills M.D.   On: 10/28/2021 17:34   DG Chest Portable 1 View  Result Date: 10/28/2021 CLINICAL DATA:  Abdominal pain and chest pain. Previous gastric  bypass surgery. EXAM: PORTABLE CHEST 1 VIEW COMPARISON:  08/05/2010 FINDINGS: Mild cardiac enlargement. Tortuous aorta. The lungs are clear except for mild scarring or atelectasis at the left lung base. The vascularity is normal. No effusions. Previous shoulder replacement on the right. IMPRESSION: Mild cardiomegaly. Aortic atherosclerosis. Mild atelectasis or scarring at the left lung base. Electronically Signed   By: Nelson Chimes M.D.   On: 10/28/2021 15:39   VAS [place] LOWER EXTREMITY VENOUS (DVT) (7a-7p)  Result Date: 10/28/2021  Lower Venous DVT Study Patient Name:  ALAYSHIA MARINI  Date of Exam:   10/28/2021 Medical Rec #: [id number]      Accession #:    [id number] Date of Birth: [birth year]      Patient Gender: F Patient Age:   58 years Exam Location:  [facility] Procedure:      VAS [place] LOWER EXTREMITY VENOUS (DVT) Referring Phys: JON KNAPP --------------------------------------------------------------------------------  Indications: Pain, and Swelling.  Risk Factors: Surgery Left knee replacement 10/19/21. Comparison Study: No previous exams Performing Technologist: Jody Hill RVT, RDMS  Examination Guidelines: A complete evaluation includes B-mode imaging, spectral Doppler, color Doppler, and power Doppler as needed of all accessible portions of each vessel. Bilateral testing is considered an integral part of a complete examination. Limited examinations for reoccurring indications may be performed as noted. The reflux portion of the exam is performed with the patient in reverse Trendelenburg.  +-----+---------------+---------+-----------+----------+--------------+ RIGHTCompressibilityPhasicitySpontaneityPropertiesThrombus Aging +-----+---------------+---------+-----------+----------+--------------+ CFV  Full           Yes  Yes                                 +-----+---------------+---------+-----------+----------+--------------+    +---------+---------------+---------+-----------+----------+--------------+ LEFT     CompressibilityPhasicitySpontaneityPropertiesThrombus Aging +---------+---------------+---------+-----------+----------+--------------+ CFV      Full           Yes      Yes                                 +---------+---------------+---------+-----------+----------+--------------+ SFJ      Full                                                        +---------+---------------+---------+-----------+----------+--------------+ FV Prox  Full           Yes      Yes                                 +---------+---------------+---------+-----------+----------+--------------+ FV Mid   Full           Yes      Yes                                 +---------+---------------+---------+-----------+----------+--------------+ FV DistalFull           Yes      Yes                                 +---------+---------------+---------+-----------+----------+--------------+ PFV      Full                                                        +---------+---------------+---------+-----------+----------+--------------+ POP      Full           Yes      Yes                                 +---------+---------------+---------+-----------+----------+--------------+ PTV      Full                                                        +---------+---------------+---------+-----------+----------+--------------+ PERO     Full                                                        +---------+---------------+---------+-----------+----------+--------------+    Summary: RIGHT: - No evidence of common femoral vein obstruction.  LEFT: - There is no evidence of deep vein thrombosis in the lower extremity. -  There is no evidence of superficial venous thrombosis.  - No cystic structure found in the popliteal fossa. Subcutaneous irregularity noted by the sonographer in area of knee and calf.  *See table(s)  above for measurements and observations. Electronically signed by Jamelle Haring on 10/28/2021 at 4:05:52 PM.    Final      Labs:   Basic Metabolic Panel: Recent Labs  Lab 10/28/21 1309 10/29/21 0241 10/30/21 0532  NA 134* 136 135  K 3.4* 4.9 4.0  CL 102 107 105  CO2 22 23 23   GLUCOSE 153* 122* 105*  BUN 12 12 10   CREATININE 0.95 0.68 0.53  CALCIUM 8.4* 7.3* 7.7*  MG  --  2.5* 2.4   GFR Estimated Creatinine Clearance: 62.4 mL/min (by C-G formula based on SCr of 0.53 mg/dL). Liver Function Tests: Recent Labs  Lab 10/28/21 1309 10/29/21 0241  AST 35 27  ALT 20 16  ALKPHOS 84 52  BILITOT 1.6* 0.9  PROT 6.6 5.7*  ALBUMIN 3.4* 3.0*   Recent Labs  Lab 10/28/21 1309  LIPASE 36   No results for input(s): AMMONIA in the last 168 hours. Coagulation profile Recent Labs  Lab 10/28/21 1332  INR 1.9*    CBC: Recent Labs  Lab 10/28/21 1309 10/29/21 0241 10/29/21 1315 10/30/21 0532  WBC 7.8 15.1* 9.8 6.7  NEUTROABS 7.1  --   --   --   HGB 8.5* 7.0* 7.4* 7.1*  HCT 27.9* 23.3* 23.8* 23.2*  MCV 99.6 100.0 98.3 98.7  PLT 120* 125* 147* 154   Cardiac Enzymes: No results for input(s): CKTOTAL, CKMB, CKMBINDEX, TROPONINI in the last 168 hours. BNP: Invalid input(s): POCBNP CBG: No results for input(s): GLUCAP in the last 168 hours. D-Dimer No results for input(s): DDIMER in the last 72 hours. Hgb A1c No results for input(s): HGBA1C in the last 72 hours. Lipid Profile No results for input(s): CHOL, HDL, LDLCALC, TRIG, CHOLHDL, LDLDIRECT in the last 72 hours. Thyroid function studies No results for input(s): TSH, T4TOTAL, T3FREE, THYROIDAB in the last 72 hours.  Invalid input(s): FREET3 Anemia work up No results for input(s): VITAMINB12, FOLATE, FERRITIN, TIBC, IRON, RETICCTPCT in the last 72 hours. Microbiology Recent Results (from the past 240 hour(s))  Resp Panel by RT-PCR (Flu A&B, Covid) Nasopharyngeal Swab     Status: None   Collection Time: 10/20/21  12:56 PM   Specimen: Nasopharyngeal Swab; Nasopharyngeal(NP) swabs in vial transport medium  Result Value Ref Range Status   SARS Coronavirus 2 by RT PCR NEGATIVE NEGATIVE Final    Comment: (NOTE) SARS-CoV-2 target nucleic acids are NOT DETECTED.  The SARS-CoV-2 RNA is generally detectable in upper respiratory specimens during the acute phase of infection. The lowest concentration of SARS-CoV-2 viral copies this assay can detect is 138 copies/mL. A negative result does not preclude SARS-Cov-2 infection and should not be used as the sole basis for treatment or other patient management decisions. A negative result may occur with  improper specimen collection/handling, submission of specimen other than nasopharyngeal swab, presence of viral mutation(s) within the areas targeted by this assay, and inadequate number of viral copies(<138 copies/mL). A negative result must be combined with clinical observations, patient history, and epidemiological information. The expected result is Negative.  Fact Sheet for Patients:  EntrepreneurPulse.com.au  Fact Sheet for Healthcare Providers:  IncredibleEmployment.be  This test is no t yet approved or cleared by the [place] FDA and  has been authorized for detection and/or diagnosis of SARS-CoV-2 by FDA under  an Emergency Use Authorization (EUA). This EUA will remain  in effect (meaning this test can be used) for the duration of the COVID-19 declaration under Section 564(b)(1) of the Act, 21 U.S.C.section 360bbb-3(b)(1), unless the authorization is terminated  or revoked sooner.       Influenza A by PCR NEGATIVE NEGATIVE Final   Influenza B by PCR NEGATIVE NEGATIVE Final    Comment: (NOTE) The Xpert Xpress SARS-CoV-2/FLU/RSV plus assay is intended as an aid in the diagnosis of influenza from Nasopharyngeal swab specimens and should not be used as a sole basis for treatment. Nasal washings and aspirates  are unacceptable for Xpert Xpress SARS-CoV-2/FLU/RSV testing.  Fact Sheet for Patients: EntrepreneurPulse.com.au  Fact Sheet for Healthcare Providers: IncredibleEmployment.be  This test is not yet approved or cleared by the [place] FDA and has been authorized for detection and/or diagnosis of SARS-CoV-2 by FDA under an Emergency Use Authorization (EUA). This EUA will remain in effect (meaning this test can be used) for the duration of the COVID-19 declaration under Section 564(b)(1) of the Act, 21 U.S.C. section 360bbb-3(b)(1), unless the authorization is terminated or revoked.  Performed at [facility], [place] [address] [place], [place] [address]   Resp Panel by RT-PCR (Flu A&B, Covid) Nasopharyngeal Swab     Status: None   Collection Time: 10/28/21  1:09 PM   Specimen: Nasopharyngeal Swab; Nasopharyngeal(NP) swabs in vial transport medium  Result Value Ref Range Status   SARS Coronavirus 2 by RT PCR NEGATIVE NEGATIVE Final    Comment: (NOTE) SARS-CoV-2 target nucleic acids are NOT DETECTED.  The SARS-CoV-2 RNA is generally detectable in upper respiratory specimens during the acute phase of infection. The lowest concentration of SARS-CoV-2 viral copies this assay can detect is 138 copies/mL. A negative result does not preclude SARS-Cov-2 infection and should not be used as the sole basis for treatment or other patient management decisions. A negative result may occur with  improper specimen collection/handling, submission of specimen other than nasopharyngeal swab, presence of viral mutation(s) within the areas targeted by this assay, and inadequate number of viral copies(<138 copies/mL). A negative result must be combined with clinical observations, patient history, and epidemiological information. The expected result is Negative.  Fact Sheet for Patients:  EntrepreneurPulse.com.au  Fact Sheet  for Healthcare Providers:  IncredibleEmployment.be  This test is no t yet approved or cleared by the [place] FDA and  has been authorized for detection and/or diagnosis of SARS-CoV-2 by FDA under an Emergency Use Authorization (EUA). This EUA will remain  in effect (meaning this test can be used) for the duration of the COVID-19 declaration under Section 564(b)(1) of the Act, 21 U.S.C.section 360bbb-3(b)(1), unless the authorization is terminated  or revoked sooner.       Influenza A by PCR NEGATIVE NEGATIVE Final   Influenza B by PCR NEGATIVE NEGATIVE Final    Comment: (NOTE) The Xpert Xpress SARS-CoV-2/FLU/RSV plus assay is intended as an aid in the diagnosis of influenza from Nasopharyngeal swab specimens and should not be used as a sole basis for treatment. Nasal washings and aspirates are unacceptable for Xpert Xpress SARS-CoV-2/FLU/RSV testing.  Fact Sheet for Patients: EntrepreneurPulse.com.au  Fact Sheet for Healthcare Providers: IncredibleEmployment.be  This test is not yet approved or cleared by the [place] FDA and has been authorized for detection and/or diagnosis of SARS-CoV-2 by FDA under an Emergency Use Authorization (EUA). This EUA will remain in effect (meaning this test can be used) for the duration of  the COVID-19 declaration under Section 564(b)(1) of the Act, 21 U.S.C. section 360bbb-3(b)(1), unless the authorization is terminated or revoked.  Performed at [facility], [place] [address], [place], [place] [address]   Blood culture (routine x 2)     Status: None (Preliminary result)   Collection Time: 10/28/21  2:43 PM   Specimen: BLOOD  Result Value Ref Range Status   Specimen Description   Final    BLOOD LEFT ANTECUBITAL Performed at [place] [address] [place], [place] [address]    Special Requests   Final    BOTTLES DRAWN AEROBIC AND  ANAEROBIC Blood Culture adequate volume Performed at [place] [address], [place], [place] [address]    Culture   Final    NO GROWTH 2 DAYS Performed at [place] [address] [place], [place] [address]    Report Status PENDING  Incomplete  Blood culture (routine x 2)     Status: None (Preliminary result)   Collection Time: 10/28/21  2:52 PM   Specimen: BLOOD  Result Value Ref Range Status   Specimen Description   Final    BLOOD RIGHT ANTECUBITAL Performed at [place] [address] [place], [place] [address]    Special Requests   Final    BOTTLES DRAWN AEROBIC AND ANAEROBIC Blood Culture adequate volume Performed at [facility] [address] [place], [place] [address]    Culture   Final    NO GROWTH 2 DAYS Performed at [place] [address], [place], [place] [address]    Report Status PENDING  Incomplete  Urine Culture     Status: Abnormal   Collection Time: 10/29/21 12:30 AM   Specimen: Urine, Clean Catch  Result Value Ref Range Status   Specimen Description   Final    URINE, CLEAN CATCH Performed at [facility], [place] [address], [place], [place] [address]    Special Requests   Final    NONE Performed at [facility], [place] [address], [place], [place] [address]    Culture (A)  Final    <10,000 COLONIES/mL INSIGNIFICANT GROWTH Performed at [place] [address], [place], [place] [address]    Report Status 10/30/2021 FINAL  Final     Discharge Instructions:   Discharge Instructions     Call MD for:  redness, tenderness, or signs of infection (pain, swelling, redness, odor or green/yellow discharge around incision site)   Complete by: As directed    Call MD for:  severe uncontrolled pain   Complete by: As directed    Call MD for:  temperature >100.4   Complete by: As directed    Diet - low sodium heart healthy    Complete by: As directed    Diet Carb Modified   Complete by: As directed    Discharge instructions   Complete by: As directed    Follow up with primary care provider  in SNF in 3-5 days. Follow up with Dr Maureen Ralphs orthopedics as has been scheduled. Complete the course of antibiotics.   Discharge wound care:   Complete by: As directed    Local pressure ulcer prevention measures   Increase activity slowly   Complete by: As directed    As per rehab      Allergies as of 10/30/2021       Reactions   Sulfonamide Derivatives Diarrhea  Cocaine    Other reaction(s): Hallucination Sinus irrigation   Montelukast Sodium    UNKNOWN   Penicillins    Local skin reaction. Tolerated ancef 07/15/2020 Tolerated Cephalosporin Date: 10/20/21.   Pioglitazone    UNKNOWN    Rosiglitazone Maleate    UNKNOWN   Secobarbital Sodium    UNKNOWN   Codeine Rash   Doxycycline Nausea Only   REACTION: GI UPSET        Medication List     TAKE these medications    acetaminophen 650 MG CR tablet Commonly known as: TYLENOL Take 650 mg by mouth every 8 (eight) hours as needed for pain.   albuterol 108 (90 Base) MCG/ACT inhaler Commonly known as: Proventil HFA Inhale 2 puffs into the lungs every 6 (six) hours as needed. What changed: reasons to take this   cefdinir 300 MG capsule Commonly known as: OMNICEF Take 1 capsule (300 mg total) by mouth 2 (two) times daily for 5 days.   diclofenac Sodium 1 % Gel Commonly known as: VOLTAREN Apply 2 g topically 4 (four) times daily. Do not apply to open area, apply to knee pain area   docusate sodium 100 MG capsule Commonly known as: COLACE Take 100 mg by mouth 2 (two) times daily as needed for mild constipation.   ferrous sulfate 325 (65 FE) MG tablet Take 325 mg by mouth daily with breakfast.   HYDROmorphone 2 MG tablet Commonly known as: DILAUDID Take 1-2 tablets (2-4 mg total) by mouth every 6 (six) hours as needed for up to 15 days for severe  pain or moderate pain. What changed: when to take this   levothyroxine 150 MCG tablet Commonly known as: Synthroid Take 1 tablet (150 mcg total) by mouth daily before breakfast. What changed:  when to take this additional instructions   Synthroid 137 MCG tablet Generic drug: levothyroxine TAKE 1 TABLET ONCE DAILY ON MONDAY, WEDNESDAY, AND FRIDAY. TAKE 150MCG DAILY ALL OTHER DAYS. What changed:  how much to take how to take this when to take this additional instructions   losartan 50 MG tablet Commonly known as: COZAAR Take 1 tablet (50 mg total) by mouth daily. What changed: how much to take   methocarbamol 500 MG tablet Commonly known as: ROBAXIN Take 1 tablet (500 mg total) by mouth every 6 (six) hours as needed for muscle spasms.   pantoprazole 40 MG tablet Commonly known as: PROTONIX Take 1 tablet (40 mg total) by mouth daily.   polyethylene glycol 17 g packet Commonly known as: MIRALAX / GLYCOLAX Take 17 g by mouth daily as needed for mild constipation.   Prolia 60 MG/ML Sosy injection Generic drug: denosumab Inject 60 mg into the skin every 6 (six) months. Deliver to [address], [place] [address]   rivaroxaban 10 MG Tabs tablet Commonly known as: XARELTO Take 1 tablet (10 mg total) by mouth daily with breakfast for 20 days.   venlafaxine XR 75 MG 24 hr capsule Commonly known as: EFFEXOR-XR TAKE 1 CAPSULE ONCE DAILY WITH BREAKFAST. What changed: See the new instructions.               Discharge Care Instructions  (From admission, onward)           Start     Ordered   10/30/21 0000  Discharge wound care:       Comments: Local pressure ulcer prevention measures   10/30/21 0846  Contact information for follow-up providers     Carollee Herter, Kendrick Fries R, DO Follow up.   Specialty: Family Medicine Contact information: [place] [address] [place] [address] [phone]              Contact  information for after-discharge care     Destination     [facility] .   Service: Skilled Nursing Contact information: [place] [phone]                      Time coordinating discharge: 39 minutes  Signed:  Adrion Menz  [facility] 10/30/2021, 10:05 AM

## 2021-10-30 NOTE — Evaluation (Signed)
Occupational Therapy Evaluation Patient Details Name: Stacy Ayers MRN: [id number] DOB: 03-Apr-1941 Today's Date: 10/30/2021   History of Present Illness 80 yo female s/p L TKA on 10/19/2021. PMH: R rTSA, DM, HTN, depression.   Clinical Impression   Patient is currently requiring setup assistance with seated UB ADLs and up to moderate assist with LB ADLs.  Current level of function is below patient's typical baseline.  During this evaluation, patient was limited by generalized weakness, impaired activity tolerance, and LT knee pain which increased with in-room ambulation and use of RW, all of which has the potential to impact patient's safety and independence during functional mobility, as well as performance for ADLs. Pt also reports that she did not wear her knee immobilizer since transfer to SNF and was educated on surgeon's previous orders to wear until pt able to perform a straight leg raise. Pt educated to wear once returned to SNF and to speak with her surgeon about it at follow up on 12/6. Patient demonstrates good rehab potential, and should benefit from continued skilled occupational therapy services while in acute care to maximize safety, independence and quality of life at home.  Continued occupational therapy services in a SNF setting prior to return home is recommended.  ?    Recommendations for follow up therapy are one component of a multi-disciplinary discharge planning process, led by the attending physician.  Recommendations may be updated based on patient status, additional functional criteria and insurance authorization.   Follow Up Recommendations  Skilled nursing-short term rehab (<3 hours/day)    Assistance Recommended at Discharge Frequent or constant Supervision/Assistance  Functional Status Assessment  Patient has had a recent decline in their functional status and demonstrates the ability to make significant improvements in function in a reasonable and predictable  amount of time.  Equipment Recommendations       Recommendations for Other Services       Precautions / Restrictions Precautions Precautions: Fall;Knee Required Braces or Orthoses: Knee Immobilizer - Left Knee Immobilizer - Left: Discontinue once straight leg raise with < 10 degree lag Restrictions Weight Bearing Restrictions: Yes LLE Weight Bearing: Weight bearing as tolerated      Mobility Bed Mobility               General bed mobility comments: Pt in recliner    Transfers Overall transfer level: Needs assistance Equipment used: Rolling walker (2 wheels) Transfers: Sit to/from Stand Sit to Stand: Supervision           General transfer comment: Pt stood and transitioned from stand to sit without need of cues for hand or foot placement. Ambulated with RW and correct step sequence.      Balance Overall balance assessment: Needs assistance   Sitting balance-Leahy Scale: Good     Standing balance support: Reliant on assistive device for balance Standing balance-Leahy Scale: Fair                             ADL either performed or assessed with clinical judgement   ADL Overall ADL's : Needs assistance/impaired Eating/Feeding: Independent;Sitting   Grooming: Set up;Sitting Grooming Details (indicate cue type and reason): recliner with LEs elevated. Upper Body Bathing: Set up;Sitting   Lower Body Bathing: Moderate assistance;Sitting/lateral leans;Sit to/from stand   Upper Body Dressing : Set up;Sitting   Lower Body Dressing: Moderate assistance;Sitting/lateral leans;Sit to/from stand   Toilet Transfer: Min guard;Ambulation;BSC/3in1;Rolling walker (2 wheels)   Toileting- Clothing  Manipulation and Hygiene: Min guard;Sitting/lateral lean;Sit to/from stand       Functional mobility during ADLs: Min guard;Supervision/safety       Vision Patient Visual Report: No change from baseline Vision Assessment?: No apparent visual  deficits Additional Comments: Pt wears a contact in 1 eye. Glasses only for reading.     Perception Perception Perception: Within Functional Limits   Praxis Praxis Praxis: Intact    Pertinent Vitals/Pain Pain Assessment: 0-10 Pain Score: 3  Pain Location: L knee Pain Descriptors / Indicators: Aching;Grimacing;Sore Pain Intervention(s): Limited activity within patient's tolerance;Monitored during session;Repositioned;Ice applied;Relaxation     Hand Dominance Right   Extremity/Trunk Assessment Upper Extremity Assessment Upper Extremity Assessment: Overall WFL for tasks assessed   Lower Extremity Assessment Lower Extremity Assessment: Defer to PT evaluation   Cervical / Trunk Assessment Cervical / Trunk Assessment: Normal   Communication Communication Communication: No difficulties   Cognition Arousal/Alertness: Awake/alert Behavior During Therapy: WFL for tasks assessed/performed Overall Cognitive Status: Within Functional Limits for tasks assessed                                       General Comments       Exercises     Shoulder Instructions      Home Living Family/patient expects to be discharged to:: Skilled nursing facility Living Arrangements: Other (Comment) Available Help at Discharge: [place]                             Additional Comments: resides in IND living at [facility], will return to SNF      Prior Functioning/Environment Prior Level of Function : Independent/Modified Independent (Prior to TKA)             Mobility Comments: In SNF, pt reports that she has been performing hallway ambulation with RW and with physical therapy. Pt reports that she never wore her knee immobilizer because it felt awkward. Pt reports that she cannot do a straight leg raise. Pt was educated on previous post-op Orders from Orthopedics on need to wear knee immobilizer. Pt encouraged to wear until her f/u appointment on  12/6. ADLs Comments: Pt reports that she has resume all UB ADLs but has been requiring assistance with LB ADLs and has not been educated on adaptive equipment yet.        OT Problem List: Pain;Increased edema;Decreased range of motion;Decreased activity tolerance;Impaired balance (sitting and/or standing)      OT Treatment/Interventions: Self-care/ADL training;Therapeutic activities;Energy conservation;Patient/family education;DME and/or AE instruction;Balance training    OT Goals(Current goals can be found in the care plan section) Acute Rehab OT Goals Patient Stated Goal: For pain to decrease to tolerate functional activities. OT Goal Formulation: With patient/family Time For Goal Achievement: 11/13/21 Potential to Achieve Goals: Good ADL Goals Pt Will Perform Grooming: standing (with LT knee pain no more than 3/10.) Pt Will Perform Lower Body Bathing: with adaptive equipment;with modified independence Pt Will Perform Lower Body Dressing: with modified independence;with adaptive equipment;sitting/lateral leans;sit to/from stand Pt Will Transfer to Toilet: with modified independence;ambulating;bedside commode Pt Will Perform Toileting - Clothing Manipulation and hygiene: with modified independence  OT Frequency: Min 2X/week   Barriers to D/C:            Co-evaluation              AM-PAC OT "6 Clicks"  Daily Activity     Outcome Measure Help from another person eating meals?: None Help from another person taking care of personal grooming?: A Little Help from another person toileting, which includes using toliet, bedpan, or urinal?: A Little Help from another person bathing (including washing, rinsing, drying)?: A Lot Help from another person to put on and taking off regular upper body clothing?: A Little Help from another person to put on and taking off regular lower body clothing?: A Lot 6 Click Score: 17   End of Session Equipment Utilized During Treatment: Gait  belt;Rolling walker (2 wheels) Nurse Communication: Mobility status  Activity Tolerance: Patient limited by pain Patient left: in chair;with call bell/phone within reach;with family/visitor present  OT Visit Diagnosis: Unsteadiness on feet (R26.81);Pain Pain - Right/Left: Left Pain - part of body: Knee                Time: 6861-6837 OT Time Calculation (min): 15 min Charges:  OT General Charges $OT Visit: 1 Visit OT Evaluation $OT Eval Low Complexity: 1 Low  Margrett Kalb, OT Acute Rehab Services Office: [phone] 10/30/2021 Julien Girt 10/30/2021, 10:13 AM

## 2021-10-30 NOTE — TOC Transition Note (Signed)
Transition of Care [facility]) - CM/SW Discharge Note   Patient Details  Name: Stacy Ayers MRN: [id number] Date of Birth: 19-Dec-1940  [facility] CM/SW Contact:  Trish Mage, LCSW Phone Number: 10/30/2021, 10:19 AM   Clinical Narrative:   Patient who is stable for d/c will return to [facility] SNF today.  Daughter to provide transportation. Coordinated with [place] at [place]. No COVID test needed, they will test there. Nursing, please call report to [phone], room 158. TOC sign off.    Final next level of care: Skilled Nursing Facility Barriers to Discharge: No Barriers Identified   Patient Goals and CMS Choice        Discharge Placement                       Discharge Plan and Services                                     Social Determinants of Health (SDOH) Interventions     Readmission Risk Interventions No flowsheet data found.

## 2021-10-30 NOTE — Progress Notes (Signed)
Pt being discharged to [facility] SN[facility] with daughter. Discharge instructions and medication education provided to pt.

## 2021-10-30 NOTE — Progress Notes (Signed)
PT Cancellation Note  Patient Details Name: Stacy Ayers MRN: [id number] DOB: 04-Apr-1941   Cancelled Treatment:    Reason Eval/Treat Not Completed: PT screened, no needs identified, will sign off (per case manager, no PT note needed. Pt is DCing back to [facility] today. Signing off.)   Blondell Reveal Kistler PT 10/30/2021  Acute Rehabilitation Services Pager [phone] Office [phone]

## 2021-10-31 ENCOUNTER — Other Ambulatory Visit: Payer: Self-pay | Admitting: Student

## 2021-10-31 ENCOUNTER — Emergency Department (HOSPITAL_COMMUNITY)
Admission: EM | Admit: 2021-10-31 | Discharge: 2021-10-31 | Disposition: A | Discharge status: Home / Self Care | Payer: Medicare PPO | Attending: Student | Admitting: Student

## 2021-10-31 ENCOUNTER — Other Ambulatory Visit: Payer: Self-pay

## 2021-10-31 ENCOUNTER — Emergency Department (HOSPITAL_COMMUNITY): Payer: Medicare PPO

## 2021-10-31 DIAGNOSIS — M779 Enthesopathy, unspecified: Secondary | ICD-10-CM

## 2021-10-31 DIAGNOSIS — Z7901 Long term (current) use of anticoagulants: Secondary | ICD-10-CM | POA: Insufficient documentation

## 2021-10-31 DIAGNOSIS — R000 Tachycardia, unspecified: Secondary | ICD-10-CM | POA: Insufficient documentation

## 2021-10-31 DIAGNOSIS — Z96652 Presence of left artificial knee joint: Secondary | ICD-10-CM | POA: Insufficient documentation

## 2021-10-31 DIAGNOSIS — Z859 Personal history of malignant neoplasm, unspecified: Secondary | ICD-10-CM | POA: Insufficient documentation

## 2021-10-31 DIAGNOSIS — Z79899 Other long term (current) drug therapy: Secondary | ICD-10-CM | POA: Diagnosis not present

## 2021-10-31 DIAGNOSIS — Z951 Presence of aortocoronary bypass graft: Secondary | ICD-10-CM | POA: Insufficient documentation

## 2021-10-31 DIAGNOSIS — M791 Myalgia, unspecified site: Secondary | ICD-10-CM | POA: Diagnosis not present

## 2021-10-31 DIAGNOSIS — E119 Type 2 diabetes mellitus without complications: Secondary | ICD-10-CM | POA: Diagnosis not present

## 2021-10-31 DIAGNOSIS — E039 Hypothyroidism, unspecified: Secondary | ICD-10-CM | POA: Diagnosis not present

## 2021-10-31 DIAGNOSIS — D72829 Elevated white blood cell count, unspecified: Secondary | ICD-10-CM | POA: Diagnosis not present

## 2021-10-31 DIAGNOSIS — Z87891 Personal history of nicotine dependence: Secondary | ICD-10-CM | POA: Diagnosis not present

## 2021-10-31 DIAGNOSIS — R531 Weakness: Secondary | ICD-10-CM | POA: Insufficient documentation

## 2021-10-31 DIAGNOSIS — Z20822 Contact with and (suspected) exposure to covid-19: Secondary | ICD-10-CM | POA: Insufficient documentation

## 2021-10-31 DIAGNOSIS — R002 Palpitations: Secondary | ICD-10-CM

## 2021-10-31 DIAGNOSIS — I10 Essential (primary) hypertension: Secondary | ICD-10-CM | POA: Insufficient documentation

## 2021-10-31 DIAGNOSIS — I471 Supraventricular tachycardia: Secondary | ICD-10-CM

## 2021-10-31 DIAGNOSIS — J45909 Unspecified asthma, uncomplicated: Secondary | ICD-10-CM | POA: Insufficient documentation

## 2021-10-31 DIAGNOSIS — Z96611 Presence of right artificial shoulder joint: Secondary | ICD-10-CM | POA: Insufficient documentation

## 2021-10-31 DIAGNOSIS — I491 Atrial premature depolarization: Secondary | ICD-10-CM | POA: Diagnosis not present

## 2021-10-31 LAB — CBC WITH DIFFERENTIAL/PLATELET
Abs Immature Granulocytes: 0.09 10*3/uL — ABNORMAL HIGH (ref 0.00–0.07)
Basophils Absolute: 0 10*3/uL (ref 0.0–0.1)
Basophils Relative: 0 %
Eosinophils Absolute: 0 10*3/uL (ref 0.0–0.5)
Eosinophils Relative: 0 %
HCT: 28.8 % — ABNORMAL LOW (ref 36.0–46.0)
Hemoglobin: 8.8 g/dL — ABNORMAL LOW (ref 12.0–15.0)
Immature Granulocytes: 1 %
Lymphocytes Relative: 5 %
Lymphs Abs: 0.7 10*3/uL (ref 0.7–4.0)
MCH: 29.5 pg (ref 26.0–34.0)
MCHC: 30.6 g/dL (ref 30.0–36.0)
MCV: 96.6 fL (ref 80.0–100.0)
Monocytes Absolute: 0.4 10*3/uL (ref 0.1–1.0)
Monocytes Relative: 3 %
Neutro Abs: 11.6 10*3/uL — ABNORMAL HIGH (ref 1.7–7.7)
Neutrophils Relative %: 91 %
Platelets: 236 10*3/uL (ref 150–400)
RBC: 2.98 MIL/uL — ABNORMAL LOW (ref 3.87–5.11)
RDW: 16.3 % — ABNORMAL HIGH (ref 11.5–15.5)
WBC: 12.8 10*3/uL — ABNORMAL HIGH (ref 4.0–10.5)
nRBC: 0 % (ref 0.0–0.2)

## 2021-10-31 LAB — LACTIC ACID, PLASMA
Lactic Acid, Venous: 1.1 mmol/L (ref 0.5–1.9)
Lactic Acid, Venous: 1 mmol/L (ref 0.5–1.9)

## 2021-10-31 LAB — COMPREHENSIVE METABOLIC PANEL
ALT: 15 U/L (ref 0–44)
AST: 17 U/L (ref 15–41)
Albumin: 3.3 g/dL — ABNORMAL LOW (ref 3.5–5.0)
Alkaline Phosphatase: 60 U/L (ref 38–126)
Anion gap: 10 (ref 5–15)
BUN: 5 mg/dL — ABNORMAL LOW (ref 8–23)
CO2: 23 mmol/L (ref 22–32)
Calcium: 8.6 mg/dL — ABNORMAL LOW (ref 8.9–10.3)
Chloride: 102 mmol/L (ref 98–111)
Creatinine, Ser: 0.59 mg/dL (ref 0.44–1.00)
GFR, Estimated: >60 mL/min (ref >60)
Glucose, Bld: 141 mg/dL — ABNORMAL HIGH (ref 70–99)
Potassium: 4.2 mmol/L (ref 3.5–5.1)
Sodium: 135 mmol/L (ref 135–145)
Total Bilirubin: 1.4 mg/dL — ABNORMAL HIGH (ref 0.3–1.2)
Total Protein: 6.6 g/dL (ref 6.5–8.1)

## 2021-10-31 LAB — URINALYSIS, ROUTINE W REFLEX MICROSCOPIC
Bilirubin Urine: NEGATIVE
Glucose, UA: NEGATIVE mg/dL
Hgb urine dipstick: NEGATIVE
Ketones, ur: 5 mg/dL — AB
Leukocytes,Ua: NEGATIVE
Nitrite: NEGATIVE
Protein, ur: NEGATIVE mg/dL
Specific Gravity, Urine: 1.009 (ref 1.005–1.030)
pH: 8 (ref 5.0–8.0)

## 2021-10-31 LAB — RESP PANEL BY RT-PCR (FLU A&B, COVID) ARPGX2
Influenza A by PCR: NEGATIVE
Influenza B by PCR: NEGATIVE
SARS Coronavirus 2 by RT PCR: NEGATIVE

## 2021-10-31 LAB — TSH: TSH: 2.001 u[IU]/mL (ref 0.350–4.500)

## 2021-10-31 LAB — TROPONIN I (HIGH SENSITIVITY)
Troponin I (High Sensitivity): 13 ng/L (ref <18)
Troponin I (High Sensitivity): 12 ng/L (ref <18)

## 2021-10-31 MED ORDER — METOPROLOL TARTRATE 25 MG PO TABS: 25.0000 mg | ORAL_TABLET | Freq: Two times a day (BID) | ORAL | 0 refills | Status: DC

## 2021-10-31 MED ORDER — KETOROLAC TROMETHAMINE 15 MG/ML IJ SOLN
15.0000 mg | Freq: Once | INTRAMUSCULAR | Status: AC
  Administered 2021-10-31: 15 mg via INTRAVENOUS
  Filled 2021-10-31: qty 1

## 2021-10-31 MED ORDER — CEFADROXIL 500 MG PO CAPS: 500.0000 mg | ORAL_CAPSULE | Freq: Two times a day (BID) | ORAL | 0 refills | Status: AC

## 2021-10-31 MED ORDER — METOPROLOL TARTRATE 5 MG/5ML IV SOLN
5.0000 mg | Freq: Once | INTRAVENOUS | Status: AC
  Administered 2021-10-31: 5 mg via INTRAVENOUS
  Filled 2021-10-31: qty 5

## 2021-10-31 NOTE — ED Notes (Signed)
Pt reports that weakness has improved as has pain

## 2021-10-31 NOTE — ED Provider Notes (Signed)
[facility] EMERGENCY DEPARTMENT Provider Note   CSN: [id number] Arrival date & time: 10/31/21  0935     History Chief Complaint  Patient presents with   Weakness    Stacy Ayers is a 80 y.o. female with PMH HTN, hypothyroidism, depression, previous gastric bypass, osteoporosis, left TKA on 10/19/2021, recent discharge on 10/30/2021 for lactic acidosis and UTI who presents emergency department for evaluation of generalized weakness, myalgias, tachycardia.  Per EMS, patient had irregular rates greater than 200 that self converted to normal sinus rhythm.  Patient states that she started a new oral antibiotic Omnicef prior to onset of symptoms.  She denies fever, chest pain, shortness of breath, abdominal pain, nausea, vomiting, diarrhea or other systemic symptoms.  She states that all of her joints hurt and she does not have increased pain or swelling around the incisional site of the TKA.  She also states she feels sleepier than normal.   Weakness Associated symptoms: arthralgias   Associated symptoms: no abdominal pain, no chest pain, no cough, no dysuria, no fever, no seizures, no shortness of breath and no vomiting       Past Medical History:  Diagnosis Date   Abnormal liver function tests    Allergy    Anxiety    Arthritis    knees, lower back    Asthma    Cancer ([place])    Cataract    left eye growing cataract    Depression    Diabetes mellitus    had gastric bypass DM resolved-    Diverticulosis    GERD (gastroesophageal reflux disease)    Headache    Previous migraines   Heart murmur    History of colon polyps    adenomatous   Hyperlipidemia    past hx- resloved after gastric bypass    Hypertension    Hypothyroidism    IBS (irritable bowel syndrome)    Iron deficiency anemia    Osteoporosis     Patient Active Problem List   Diagnosis Date Noted   Pressure injury of skin 10/29/2021   Lactic acidosis 10/28/2021   Left knee pain 10/28/2021    Primary osteoarthritis of left knee 10/19/2021   Pre-op evaluation 10/12/2021   Depression, major, single episode, mild ([place]) 07/24/2021   Age-related osteoporosis without current pathological fracture 06/25/2021   Diabetes mellitus ([place]) 04/28/2021   DM (diabetes mellitus) type II, controlled, with peripheral vascular disorder ([place]) 04/28/2021   Morbid obesity ([place]) 04/28/2021   Anemia 04/28/2021   Fatigue 03/23/2021   Right ear pain 11/06/2020   Osteoarthritis of right knee 10/01/2020   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   Viral upper respiratory tract infection 11/14/2018   OA (osteoarthritis) of knee 05/12/2018   Dysuria 05/03/2018   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   Seasonal allergies 08/11/2015   Alcohol dependence ([place]) 08/11/2015   CTS (carpal tunnel syndrome) 08/11/2015   Allergic rhinitis 12/19/2013   B12 deficiency 05/22/2013   Left foot pain 09/06/2012   Depression with anxiety 04/05/2012   Abdominal pain, generalized 01/18/2011   PERSONAL HISTORY OF COLONIC POLYPS 01/18/2011   SPONTANEOUS ECCHYMOSES 10/13/2010   DIARRHEA, CHRONIC 10/13/2010   PHARYNGITIS, CHRONIC 02/16/2010   Hypothyroidism 01/14/2010   History of bariatric surgery 02/25/2009   Vitamin D deficiency 09/10/2008   VARICOSE VEINS, LOWER EXTREMITIES 09/10/2008   PULMONARY NODULE 08/14/2008   FATIGUE 08/14/2008   MUSCLE PAIN 06/14/2007   BLADDER PAIN 06/14/2007   COLONIC  POLYPS 03/14/2007   History of diet-controlled diabetes 03/14/2007   Hyperlipidemia LDL goal <100 03/14/2007   ANEMIA-IRON DEFICIENCY 03/14/2007   Essential hypertension 03/14/2007   VOCAL CORD PARALYSIS 03/14/2007   EDEMA, LARYNX 03/14/2007   ASTHMA 03/14/2007   Esophageal reflux 03/14/2007   LIVER FUNCTION TESTS, ABNORMAL, HX OF 03/14/2007   TONSILLECTOMY AND ADENOIDECTOMY, HX OF 03/14/2007    Past Surgical History:  Procedure Laterality Date   ABDOMINAL HYSTERECTOMY     ABDOMINAL  HYSTERECTOMY     APPENDECTOMY     BLADDER REPAIR     BREAST BIOPSY Right    needle core biopsy, benign   CATARACT EXTRACTION Left    CHOLECYSTECTOMY     COLONOSCOPY     CORONARY ANGIOPLASTY     GASTRIC BYPASS     POLYPECTOMY     REVERSE SHOULDER ARTHROPLASTY Right 07/15/2020   Procedure: REVERSE SHOULDER ARTHROPLASTY;  Surgeon: Justice Britain, MD;  Location: [facility];  Service: Orthopedics;  Laterality: Right;  160min   TONSILLECTOMY AND ADENOIDECTOMY     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   Procedure: TOTAL KNEE ARTHROPLASTY;  Surgeon: Gaynelle Arabian, MD;  Location: [facility];  Service: Orthopedics;  Laterality: Left;     OB History   No obstetric history on file.     Family History  Problem Relation Age of Onset   Stroke Mother    Asthma Mother    Anuerysm Mother    Obesity Father    Diabetes Sister    Heart disease Maternal Grandmother    Angina Maternal Grandmother    Suicidality Maternal Grandfather    Cancer Paternal Grandmother    Colon cancer Neg Hx    Colon polyps Neg Hx    Esophageal cancer Neg Hx    Rectal cancer Neg Hx    Stomach cancer Neg Hx    Pancreatic cancer Neg Hx    Liver disease Neg Hx     Social History   Tobacco Use   Smoking status: Former    Packs/day: 0.30    Years: 2.00    Pack years: 0.60    Types: Cigarettes    Quit date: 07/11/1962    Years since quitting: 59.3   Smokeless tobacco: Never  Vaping Use   Vaping Use: Never used  Substance Use Topics   Alcohol use: Yes   Drug use: No    Home Medications Prior to Admission medications   Medication Sig Start Date End Date Taking? Authorizing Provider  cefadroxil (DURICEF) 500 MG capsule Take 1 capsule (500 mg total) by mouth 2 (two) times daily for 5 days. 10/31/21 11/05/21 Yes Jakevious Hollister, MD  metoprolol tartrate (LOPRESSOR) 25 MG tablet Take 1 tablet (25 mg total) by mouth 2 (two) times daily. 10/31/21 11/30/21 Yes Valoree Agent, MD  acetaminophen (TYLENOL) 650 MG CR tablet Take  650 mg by mouth every 8 (eight) hours as needed for pain.    [provider]  albuterol (PROVENTIL HFA) 108 (90 Base) MCG/ACT inhaler Inhale 2 puffs into the lungs every 6 (six) hours as needed. Patient taking differently: Inhale 2 puffs into the lungs every 6 (six) hours as needed for shortness of breath. 12/16/20   [name], DO  denosumab (PROLIA) 60 MG/ML SOSY injection Inject 60 mg into the skin every 6 (six) months. Deliver to [address] [place], [place] [id number] 07/07/21   Collier Salina, MD  diclofenac Sodium (VOLTAREN) 1 % GEL Apply 2 g topically 4 (  four) times daily. Do not apply to open area, apply to knee pain area 10/30/21   Pokhrel, Corrie Mckusick, MD  docusate sodium (COLACE) 100 MG capsule Take 100 mg by mouth 2 (two) times daily as needed for mild constipation.    [provider]  ferrous sulfate 325 (65 FE) MG tablet Take 325 mg by mouth daily with breakfast.    [provider]  HYDROmorphone (DILAUDID) 2 MG tablet Take 1-2 tablets (2-4 mg total) by mouth every 6 (six) hours as needed for up to 15 days for severe pain or moderate pain. 10/30/21 11/14/21  Pokhrel, Corrie Mckusick, MD  levothyroxine (SYNTHROID) 150 MCG tablet Take 1 tablet (150 mcg total) by mouth daily before breakfast. Patient taking differently: Take 150 mcg by mouth See admin instructions. Take one tablet by mouth on Sun, Tue, thur, sat. 11/06/20   [name], DO  losartan (COZAAR) 50 MG tablet Take 1 tablet (50 mg total) by mouth daily. Patient taking differently: Take 50 mg by mouth daily. 06/03/21   [name], DO  methocarbamol (ROBAXIN) 500 MG tablet Take 1 tablet (500 mg total) by mouth every 6 (six) hours as needed for muscle spasms. 10/20/21   Edmisten, Kristie L, PA  pantoprazole (PROTONIX) 40 MG tablet Take 1 tablet (40 mg total) by mouth daily. 11/06/20   [name], DO  polyethylene glycol (MIRALAX / GLYCOLAX) 17 g packet Take 17 g by mouth  daily as needed for mild constipation.    [provider]  rivaroxaban (XARELTO) 10 MG TABS tablet Take 1 tablet (10 mg total) by mouth daily with breakfast for 20 days. 10/21/21 11/10/21  Edmisten, Kristie L, PA  SYNTHROID 137 MCG tablet TAKE 1 TABLET ONCE DAILY ON MONDAY, WEDNESDAY, AND FRIDAY. TAKE 150MCG DAILY ALL OTHER DAYS. Patient taking differently: Take 137 mcg by mouth See admin instructions. Take one tablet by mouth on Mon, Wed, Fridays 10/06/21   Carollee Herter, Alferd Apa, DO  venlafaxine XR (EFFEXOR-XR) 75 MG 24 hr capsule TAKE 1 CAPSULE ONCE DAILY WITH BREAKFAST. Patient taking differently: 75 mg daily with breakfast. 10/12/21   Carollee Herter, Alferd Apa, DO    Allergies    Sulfonamide derivatives, Cocaine, Montelukast sodium, Penicillins, Pioglitazone, Rosiglitazone maleate, Secobarbital sodium, Codeine, and Doxycycline  Review of Systems   Review of Systems  Constitutional:  Negative for chills and fever.  HENT:  Negative for ear pain and sore throat.   Eyes:  Negative for pain and visual disturbance.  Respiratory:  Negative for cough and shortness of breath.   Cardiovascular:  Positive for palpitations. Negative for chest pain.  Gastrointestinal:  Negative for abdominal pain and vomiting.  Genitourinary:  Negative for dysuria and hematuria.  Musculoskeletal:  Positive for arthralgias. Negative for back pain.  Skin:  Negative for color change and rash.  Neurological:  Positive for weakness. Negative for seizures and syncope.  All other systems reviewed and are negative.  Physical Exam Updated Vital Signs BP 134/62 (BP Location: Right Arm)   Pulse 80   Temp 99 F (37.2 C) (Oral)   Resp 17   Ht 5\' 7"  (1.702 m)   Wt 78 kg   SpO2 100%   BMI 26.93 kg/m   Physical Exam Vitals and nursing note reviewed.  Constitutional:      General: She is not in acute distress.    Appearance: She is well-developed.  HENT:     Head: Normocephalic and atraumatic.  Eyes:  Conjunctiva/sclera: Conjunctivae normal.  Cardiovascular:     Rate and Rhythm: Tachycardia present. Rhythm irregular.     Heart sounds: No murmur heard. Pulmonary:     Effort: Pulmonary effort is normal. No respiratory distress.     Breath sounds: Normal breath sounds.  Abdominal:     Palpations: Abdomen is soft.     Tenderness: There is no abdominal tenderness.  Musculoskeletal:        General: No swelling.     Cervical back: Neck supple.     Comments: TKA incisional site clean dry and intact, no erythema or swelling  Skin:    General: Skin is warm and dry.     Capillary Refill: Capillary refill takes less than 2 seconds.  Neurological:     Mental Status: She is alert.  Psychiatric:        Mood and Affect: Mood normal.    ED Results / Procedures / Treatments   Labs (all labs ordered are listed, but only abnormal results are displayed) Labs Reviewed  COMPREHENSIVE METABOLIC PANEL - Abnormal; Notable for the following components:      Result Value   Glucose, Bld 141 (*)    BUN 5 (*)    Calcium 8.6 (*)    Albumin 3.3 (*)    Total Bilirubin 1.4 (*)    All other components within normal limits  CBC WITH DIFFERENTIAL/PLATELET - Abnormal; Notable for the following components:   WBC 12.8 (*)    RBC 2.98 (*)    Hemoglobin 8.8 (*)    HCT 28.8 (*)    RDW 16.3 (*)    Neutro Abs 11.6 (*)    Abs Immature Granulocytes 0.09 (*)    All other components within normal limits  URINALYSIS, ROUTINE W REFLEX MICROSCOPIC - Abnormal; Notable for the following components:   Ketones, ur 5 (*)    All other components within normal limits  RESP PANEL BY RT-PCR (FLU A&B, COVID) ARPGX2  LACTIC ACID, PLASMA  LACTIC ACID, PLASMA  TSH  TROPONIN I (HIGH SENSITIVITY)  TROPONIN I (HIGH SENSITIVITY)    EKG None  Radiology DG Chest Portable 1 View  Result Date: 10/31/2021 CLINICAL DATA:  Generalized weakness, new onset palpitations. Evaluate for pneumonia. EXAM: PORTABLE CHEST 1 VIEW  COMPARISON:  Prior chest x-ray 10/28/2021 FINDINGS: Stable cardiac and mediastinal contours. No focal airspace opacity. No significant pulmonary edema, pleural effusion or pneumothorax. Similar appearance of left basilar atelectasis versus scarring. No acute osseous abnormality. Surgical changes of right reverse shoulder arthroplasty. IMPRESSION: No active disease. Electronically Signed   By: Jacqulynn Cadet M.D.   On: 10/31/2021 15:07    Procedures Procedures   Medications Ordered in ED Medications  metoprolol tartrate (LOPRESSOR) injection 5 mg (5 mg Intravenous Given 10/31/21 1033)  ketorolac (TORADOL) 15 MG/ML injection 15 mg (15 mg Intravenous Given 10/31/21 1215)    ED Course  I have reviewed the triage vital signs and the nursing notes.  Pertinent labs & imaging results that were available during my care of the patient were reviewed by me and considered in my medical decision making (see chart for details).    MDM Rules/Calculators/A&P                           Patient seen the emergency department for evaluation of joint pain, palpitations and tachycardia.  Physical exam reveals an irregular tachycardia but is otherwise unremarkable.  TKA site clean dry and intact with no  erythema or swelling.  Laboratory evaluation with a new mild leukocytosis to 12.8, hemoglobin improved at 8.8, Trope unremarkable, TSH unremarkable, lactic acid unremarkable, chemistry unremarkable.  Patient's ECG showing normal sinus rhythm with multiple runs of atrial tachycardia.  She was given 5 mg Lopressor which resolved her atrial tachycardia and throughout her 6-hour stay in the emergency department she did not have return of this atrial tachycardia.  I spoke with cardiology about this new finding on her ECG and cardiology recommended initiating 25 mg Lopressor twice daily and follow-up in the outpatient setting.  Urinalysis unremarkable, chest x-ray unremarkable.  On reevaluation, patient initially improved  with Toradol but then became intermittently sleepy which worries the patient's daughter.  Suspect the patient may be intolerant to the third-generation cephalosporin and we will transition her to [facility] which she has tolerated without difficulty in the past.  The patient will require outpatient follow-up with her primary care physician and outpatient cardiology.  She was discharged hemodynamically stable, and on reevaluation, she is alert and oriented answering all questions appropriately and does not show any signs of active delirium in the emergency department. Final Clinical Impression(s) / ED Diagnoses Final diagnoses:  Myalgia  Tendinitis  Atrial tachycardia ([place])    Rx / DC Orders ED Discharge Orders          Ordered    cefadroxil (DURICEF) 500 MG capsule  2 times daily        10/31/21 1517    metoprolol tartrate (LOPRESSOR) 25 MG tablet  2 times daily        10/31/21 1523             Fitzpatrick Alberico, [place], MD 10/31/21 [phone]

## 2021-10-31 NOTE — ED Triage Notes (Signed)
Pt BIB EMS from [facility] for increased generalized weakness, new onset palpitations, and EMS reports 3-4 sec. Runs of SVT in the 200's and multiple PVC's and PAC's. PT was d/c from [facility] long yesterday for Lactic Acidosis and UTI. Pt started abx tx and aquired new sx this AM. Pt did not take her morning meds. Denies CP/SHOB

## 2021-10-31 NOTE — Progress Notes (Signed)
ED provider talked with Dr. Johney Frame about this patient who presented with multiple complaints including palpitations. EKG was abnormal and showed intermittent brief runs of what looks like possible atrial tachycardia. Patient was reportedly having recurrent similar episodes on telemetry in the ED. Dr. Johney Frame recommended outpatient monitor with 2 week Zio. Patient previously seen by Dr. Claiborne Billings back in 2018 so will arrange New Patient Visit with Dr. Johney Frame in about 1 month to review monitor results.  Darreld Mclean, PA-C 10/31/2021 3:26 PM

## 2021-11-02 ENCOUNTER — Ambulatory Visit (INDEPENDENT_AMBULATORY_CARE_PROVIDER_SITE_OTHER): Payer: Medicare PPO

## 2021-11-02 ENCOUNTER — Encounter: Payer: Self-pay | Admitting: Internal Medicine

## 2021-11-02 ENCOUNTER — Non-Acute Institutional Stay (SKILLED_NURSING_FACILITY): Payer: Medicare PPO | Admitting: Internal Medicine

## 2021-11-02 DIAGNOSIS — E038 Other specified hypothyroidism: Secondary | ICD-10-CM | POA: Diagnosis not present

## 2021-11-02 DIAGNOSIS — E785 Hyperlipidemia, unspecified: Secondary | ICD-10-CM | POA: Diagnosis not present

## 2021-11-02 DIAGNOSIS — K5901 Slow transit constipation: Secondary | ICD-10-CM | POA: Diagnosis not present

## 2021-11-02 DIAGNOSIS — E8729 Other acidosis: Secondary | ICD-10-CM | POA: Diagnosis not present

## 2021-11-02 DIAGNOSIS — I471 Supraventricular tachycardia: Secondary | ICD-10-CM | POA: Diagnosis not present

## 2021-11-02 DIAGNOSIS — E039 Hypothyroidism, unspecified: Secondary | ICD-10-CM | POA: Diagnosis not present

## 2021-11-02 DIAGNOSIS — D649 Anemia, unspecified: Secondary | ICD-10-CM

## 2021-11-02 DIAGNOSIS — M62562 Muscle wasting and atrophy, not elsewhere classified, left lower leg: Secondary | ICD-10-CM | POA: Diagnosis not present

## 2021-11-02 DIAGNOSIS — L03116 Cellulitis of left lower limb: Secondary | ICD-10-CM | POA: Diagnosis not present

## 2021-11-02 DIAGNOSIS — Z96652 Presence of left artificial knee joint: Secondary | ICD-10-CM

## 2021-11-02 DIAGNOSIS — R278 Other lack of coordination: Secondary | ICD-10-CM | POA: Diagnosis not present

## 2021-11-02 DIAGNOSIS — Z471 Aftercare following joint replacement surgery: Secondary | ICD-10-CM | POA: Diagnosis not present

## 2021-11-02 DIAGNOSIS — R002 Palpitations: Secondary | ICD-10-CM

## 2021-11-02 DIAGNOSIS — M1712 Unilateral primary osteoarthritis, left knee: Secondary | ICD-10-CM | POA: Diagnosis not present

## 2021-11-02 DIAGNOSIS — R2689 Other abnormalities of gait and mobility: Secondary | ICD-10-CM | POA: Diagnosis not present

## 2021-11-02 LAB — BASIC METABOLIC PANEL
BUN: 8 (ref 4–21)
CO2: 24 — AB (ref 13–22)
Chloride: 105 (ref 99–108)
Creatinine: 0.6 (ref 0.5–1.1)
Creatinine: 0.6 (ref 0.5–1.1)
Glucose: 90
Glucose: 90
Potassium: 4.3 (ref 3.4–5.3)
Sodium: 142 (ref 137–147)

## 2021-11-02 LAB — CULTURE, BLOOD (ROUTINE X 2)
Culture: NO GROWTH
Culture: NO GROWTH
Special Requests: ADEQUATE
Special Requests: ADEQUATE

## 2021-11-02 LAB — CBC AND DIFFERENTIAL
HCT: 25 — AB (ref 36–46)
Hemoglobin: 7.9 — AB (ref 12.0–16.0)
Platelets: 264 (ref 150–399)
WBC: 6

## 2021-11-02 LAB — COMPREHENSIVE METABOLIC PANEL
Calcium: 8.4 — AB (ref 8.7–10.7)
Calcium: 8.4 — AB (ref 8.7–10.7)

## 2021-11-02 LAB — CBC: RBC: 2.69 — AB (ref 3.87–5.11)

## 2021-11-02 NOTE — Progress Notes (Unsigned)
Enrolled patient for a 14 day Zio XT monitor to be mailed to patients home  [place] to read

## 2021-11-02 NOTE — Progress Notes (Signed)
Location:   [place] Room Number: 158 Place of Service:  SNF [phone]) Provider:  Veleta Miners, MD  [name], DO  Patient Care Team: Carollee Herter, Alferd Apa, DO as PCP - Gaston Islam, MD as Consulting Physician (Orthopedic Surgery) Marica Otter, [place] as Consulting Physician (Optometry) Sharyn Lull Mottinger as Consulting Physician (Dentistry) Marica Otter, [place] [facility])  Extended Emergency Contact Information Primary Emergency Contact: [facility] Address: [address]          [place], [place] [address] Johnnette Litter of [place] Phone: [phone] Relation: Daughter  Code Status:  FULL CODE Goals of care: Advanced Directive information Advanced Directives 11/02/2021  Does Patient Have a Medical Advance Directive? Yes  Type of Advance Directive Living will;Healthcare Power of Attorney  Does patient want to make changes to medical advance directive? No - Patient declined  Copy of [place] in Chart? Yes - validated most recent copy scanned in chart (See row information)  Would patient like information on creating a medical advance directive? -     Chief Complaint  Patient presents with   Acute Visit    Rehab. Left knee replacement, ED visit for chest pain, elevated lactic acid and low blood pressure.    HPI:  Pt is a 80 y.o. female seen today for an acute visit for Follow up from ED and Hospitalization  She has h/o Osteoporosis,Arthirits and Depression and h/o Gastric Bypass Admitted to SNF for therapy after undergoing left TKA on 11/21 by Dr. Maureen Ralphs   Was send to ED for Severe Pain in her Abdomen and Worsening of her pain in Left knee with SOB Her CT scan of Abdomen and Chest was negative for any acute process Her BP was low initially responded to IV fluids and also Lactic acid was elevated Was discharged on [place] for 5 more days for Possible ? Cellulitis Knee is now better no pain and  Moving better. Was seen by Ortho and they think she is doing well with her knee She was again send to the hospital on 12/03 for Episode of Tachycardia EKG showed normal sinus rhythm with multiple runs of atrial tachycardia.  She was given 5 mg Lopressor which resolved her atrial tachycardia  Her Omnicef was changed [employer]. Also started on Lopressor and Cardiology planning to send her Zio patch  She is doing much better. She says she feels like a different person Pain is better Bowels are moving Eating no Abdominal Discomfort    Past Medical History:  Diagnosis Date   Abnormal liver function tests    Allergy    Anxiety    Arthritis    knees, lower back    Asthma    Cancer ([place] )    Cataract    left eye growing cataract    Depression    Diabetes mellitus    had gastric bypass DM resolved-    Diverticulosis    GERD (gastroesophageal reflux disease)    Headache    Previous migraines   Heart murmur    History of colon polyps    adenomatous   Hyperlipidemia    past hx- resloved after gastric bypass    Hypertension    Hypothyroidism    IBS (irritable bowel syndrome)    Iron deficiency anemia    Osteoporosis    Past Surgical History:  Procedure Laterality Date   ABDOMINAL HYSTERECTOMY     ABDOMINAL HYSTERECTOMY     APPENDECTOMY  BLADDER REPAIR     BREAST BIOPSY Right    needle core biopsy, benign   CATARACT EXTRACTION Left    CHOLECYSTECTOMY     COLONOSCOPY     CORONARY ANGIOPLASTY     GASTRIC BYPASS     POLYPECTOMY     REVERSE SHOULDER ARTHROPLASTY Right 07/15/2020   Procedure: REVERSE SHOULDER ARTHROPLASTY;  Surgeon: Justice Britain, MD;  Location: [facility];  Service: Orthopedics;  Laterality: Right;  13min   TONSILLECTOMY AND ADENOIDECTOMY     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   Procedure: TOTAL KNEE ARTHROPLASTY;  Surgeon: Gaynelle Arabian, MD;  Location: [facility];  Service: Orthopedics;  Laterality: Left;    Allergies  Allergen Reactions   Sulfonamide  Derivatives Diarrhea   Cocaine     Other reaction(s): Hallucination Sinus irrigation   Montelukast Sodium     UNKNOWN   Penicillins     Local skin reaction.  Tolerated ancef 07/15/2020 Tolerated Cephalosporin Date: 10/20/21.     Pioglitazone     UNKNOWN    Rosiglitazone Maleate     UNKNOWN   Secobarbital Sodium     UNKNOWN   Codeine Rash   Doxycycline Nausea Only    REACTION: GI UPSET    Allergies as of 11/02/2021       Reactions   Sulfonamide Derivatives Diarrhea   Cocaine    Other reaction(s): Hallucination Sinus irrigation   Montelukast Sodium    UNKNOWN   Penicillins    Local skin reaction. Tolerated ancef 07/15/2020 Tolerated Cephalosporin Date: 10/20/21.   Pioglitazone    UNKNOWN    Rosiglitazone Maleate    UNKNOWN   Secobarbital Sodium    UNKNOWN   Codeine Rash   Doxycycline Nausea Only   REACTION: GI UPSET        Medication List        Accurate as of November 02, 2021 10:30 AM. If you have any questions, ask your nurse or doctor.          acetaminophen 650 MG CR tablet Commonly known as: TYLENOL Take 650 mg by mouth every 8 (eight) hours as needed for pain.   albuterol 108 (90 Base) MCG/ACT inhaler Commonly known as: Proventil HFA Inhale 2 puffs into the lungs every 6 (six) hours as needed. What changed: reasons to take this   cefadroxil 500 MG capsule Commonly known as: DURICEF Take 1 capsule (500 mg total) by mouth 2 (two) times daily for 5 days.   diclofenac Sodium 1 % Gel Commonly known as: VOLTAREN Apply 2 g topically 4 (four) times daily. Do not apply to open area, apply to knee pain area   docusate sodium 100 MG capsule Commonly known as: COLACE Take 100 mg by mouth 2 (two) times daily as needed for mild constipation.   ferrous sulfate 325 (65 FE) MG tablet Take 325 mg by mouth daily with breakfast.   HYDROmorphone 2 MG tablet Commonly known as: DILAUDID Take 1-2 tablets (2-4 mg total) by mouth every 6 (six) hours as  needed for up to 15 days for severe pain or moderate pain.   levothyroxine 150 MCG tablet Commonly known as: Synthroid Take 1 tablet (150 mcg total) by mouth daily before breakfast. What changed:  when to take this additional instructions   Synthroid 137 MCG tablet Generic drug: levothyroxine TAKE 1 TABLET ONCE DAILY ON MONDAY, WEDNESDAY, AND FRIDAY. TAKE 150MCG DAILY ALL OTHER DAYS. What changed:  how much to take how to take this when to take  this additional instructions   losartan 50 MG tablet Commonly known as: COZAAR Take 1 tablet (50 mg total) by mouth daily. What changed: how much to take   methocarbamol 500 MG tablet Commonly known as: ROBAXIN Take 1 tablet (500 mg total) by mouth every 6 (six) hours as needed for muscle spasms.   metoprolol tartrate 25 MG tablet Commonly known as: LOPRESSOR Take 1 tablet (25 mg total) by mouth 2 (two) times daily.   pantoprazole 40 MG tablet Commonly known as: PROTONIX Take 1 tablet (40 mg total) by mouth daily.   polyethylene glycol 17 g packet Commonly known as: MIRALAX / GLYCOLAX Take 17 g by mouth daily as needed for mild constipation.   Prolia 60 MG/ML Sosy injection Generic drug: denosumab Inject 60 mg into the skin every 6 (six) months. Deliver to [address], [place] [address]   rivaroxaban 10 MG Tabs tablet Commonly known as: XARELTO Take 1 tablet (10 mg total) by mouth daily with breakfast for 20 days.   venlafaxine XR 75 MG 24 hr capsule Commonly known as: EFFEXOR-XR TAKE 1 CAPSULE ONCE DAILY WITH BREAKFAST. What changed: See the new instructions.        Review of Systems  Constitutional:  Negative for activity change and appetite change.  HENT: Negative.    Respiratory:  Negative for cough and shortness of breath.   Cardiovascular:  Positive for leg swelling.  Gastrointestinal:  Negative for constipation.  Genitourinary: Negative.   Musculoskeletal:  Positive for arthralgias and  gait problem. Negative for myalgias.  Skin:  Positive for pallor.  Neurological:  Positive for weakness. Negative for dizziness.  Psychiatric/Behavioral:  Negative for confusion, dysphoric mood and sleep disturbance.    Immunization History  Administered Date(s) Administered   Fluad Quad(high Dose 65+) 10/12/2021   Influenza Split 09/08/2011, 08/25/2012   Influenza Whole 08/26/2010   Influenza, High Dose Seasonal PF 08/06/2016, 08/05/2017, 10/31/2018   Influenza, Quadrivalent, Recombinant, Inj, Pf 09/07/2019   Influenza,inj,Quad PF,6+ Mos 08/09/2014, 08/08/2015   Influenza,inj,quad, With Preservative 08/01/2017   Moderna Sars-Covid-2 Vaccination 12/11/2019, 01/08/2020, 10/14/2020, 03/03/2021   Pneumococcal Conjugate-13 12/27/2014   Pneumococcal Polysaccharide-23 08/05/2010, 08/05/2017   Tdap 07/12/2011   Zoster Recombinat (Shingrix) 09/07/2019   Zoster, Live 07/21/2011   Pertinent  Health Maintenance Due  Topic Date Due   OPHTHALMOLOGY EXAM  07/21/2018   FOOT EXAM  08/05/2018   HEMOGLOBIN A1C  02/14/2022   INFLUENZA VACCINE  Completed   DEXA SCAN  Completed   COLONOSCOPY (Pts 45-49yrs Insurance coverage will need to be confirmed)  Discontinued   Fall Risk 10/20/2021 10/28/2021 10/29/2021 10/29/2021 10/31/2021  Falls in the past year? - - - - -  Number of falls in past year - - - - -  Was there an injury with Fall? - - - - -  Fall Risk Category Calculator - - - - -  Fall Risk Category - - - - -  Patient Fall Risk Level High fall risk High fall risk Moderate fall risk High fall risk Moderate fall risk  Patient at Risk for Falls Due to - - - - -  Fall risk Follow up - - - - -   Functional Status Survey:    Vitals:   11/02/21 1019  BP: 132/82  Pulse: 67  Resp: 14  Temp: 98.1 F (36.7 C)  SpO2: 99%  Weight: 174 lb (78.9 kg)  Height: 5\' 7"  (1.702 m)   Body mass index is 27.25 kg/m. Physical  Exam Vitals reviewed.  Constitutional:      Appearance: Normal appearance.   HENT:     Head: Normocephalic.     Nose: Nose normal.     Mouth/Throat:     Mouth: Mucous membranes are moist.     Pharynx: Oropharynx is clear.  Eyes:     Pupils: Pupils are equal, round, and reactive to light.  Cardiovascular:     Rate and Rhythm: Normal rate and regular rhythm.     Pulses: Normal pulses.     Heart sounds: Normal heart sounds. No murmur heard. Pulmonary:     Effort: Pulmonary effort is normal.     Breath sounds: Normal breath sounds.  Abdominal:     General: Abdomen is flat. Bowel sounds are normal.     Palpations: Abdomen is soft.  Musculoskeletal:     Cervical back: Neck supple.     Comments: Mild swelling Bilateral Left Knee Redness is resolved and less swollen Not tender  Skin:    General: Skin is warm.  Neurological:     General: No focal deficit present.     Mental Status: She is alert and oriented to person, place, and time.  Psychiatric:        Mood and Affect: Mood normal.        Thought Content: Thought content normal.    Labs reviewed: Recent Labs    10/29/21 0241 10/30/21 0532 10/31/21 1006  NA 136 135 135  K 4.9 4.0 4.2  CL 107 105 102  CO2 23 23 23   GLUCOSE 122* 105* 141*  BUN 12 10 5*  CREATININE 0.68 0.53 0.59  CALCIUM 7.3* 7.7* 8.6*  MG 2.5* 2.4  --    Recent Labs    10/28/21 1309 10/29/21 0241 10/31/21 1006  AST 35 27 17  ALT 20 16 15   ALKPHOS 84 52 60  BILITOT 1.6* 0.9 1.4*  PROT 6.6 5.7* 6.6  ALBUMIN 3.4* 3.0* 3.3*   Recent Labs    04/28/21 1407 10/09/21 1118 10/28/21 1309 10/29/21 0241 10/29/21 1315 10/30/21 0532 10/31/21 1006  WBC 5.0   < > 7.8   < > 9.8 6.7 12.8*  NEUTROABS 3.3  --  7.1  --   --   --  11.6*  HGB 11.2*   < > 8.5*   < > 7.4* 7.1* 8.8*  HCT 33.4*   < > 27.9*   < > 23.8* 23.2* 28.8*  MCV 91.7   < > 99.6   < > 98.3 98.7 96.6  PLT 186.0   < > 120*   < > 147* 154 236   < > = values in this interval not displayed.   Lab Results  Component Value Date   TSH 2.001 10/31/2021   Lab  Results  Component Value Date   HGBA1C 5.5 08/17/2021   Lab Results  Component Value Date   CHOL 169 08/17/2021   HDL 53 08/17/2021   LDLCALC 94 08/17/2021   LDLDIRECT 147.0 05/28/2019   TRIG 127 08/17/2021   CHOLHDL 4 11/06/2020    Significant Diagnostic Results in last 30 days:  DG Abdomen 1 View  Result Date: 10/28/2021 CLINICAL DATA:  Abdominal pain. EXAM: ABDOMEN - 1 VIEW COMPARISON:  None. FINDINGS: The bowel gas pattern is normal. No radio-opaque calculi or other significant radiographic abnormality are seen. IMPRESSION: Negative. Electronically Signed   By: Marijo Conception M.D.   On: 10/28/2021 15:39   CT Angio Chest Pulmonary Embolism (PE) W  or WO Contrast  Result Date: 10/28/2021 CLINICAL DATA:  Suspected bowel obstruction, chest pain and shortness of breath. 80 year old female. EXAM: CT ANGIOGRAPHY CHEST CT ABDOMEN AND PELVIS WITH CONTRAST TECHNIQUE: Multidetector CT imaging of the chest was performed using the standard protocol during bolus administration of intravenous contrast. Multiplanar CT image reconstructions and MIPs were obtained to evaluate the vascular anatomy. Multidetector CT imaging of the abdomen and pelvis was performed using the standard protocol during bolus administration of intravenous contrast. CONTRAST:  32mL OMNIPAQUE IOHEXOL 350 MG/ML SOLN COMPARISON:  August 12, 2008 chest CT. FINDINGS: CTA CHEST FINDINGS Cardiovascular: Scattered aortic atherosclerosis. Normal caliber of the thoracic aorta. Normal caliber of the main pulmonary arteries. Main pulmonary artery is moderately well opacified at 245 Hounsfield units. There is some motion at the lung bases which does limit assessment. No signs of pulmonary embolism to the segmental level. Mediastinum/Nodes: No thoracic inlet, axillary, mediastinal or hilar adenopathy. Esophagus grossly normal. Lungs/Pleura: Basilar atelectasis. No effusion. No consolidative process. Musculoskeletal: No acute bone finding. No  destructive bone process. Spinal degenerative changes. See below for full musculoskeletal detail about the abdomen and pelvis. Review of the MIP images confirms the above findings. CT ABDOMEN and PELVIS FINDINGS Hepatobiliary: Liver contour is lobular with fissural widening. Post cholecystectomy. No biliary duct distension. Portal vein is patent. No focal, suspicious hepatic lesion. Pancreas: Normal contour without signs of inflammation. Spleen: Top normal size, no visible lesion. Adrenals/Urinary Tract: Adrenal glands are normal. Symmetric renal enhancement without hydronephrosis. No nephrolithiasis. Small amount of gas in the urinary bladder. Small cysts in the bilateral kidneys. 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 Stomach/Bowel: Signs of gastric bypass procedure. Ingested tablet seen in the dependent portion of the gastric pouch does not changed density from arterial to venous phase or change in size. Small hiatal hernia. Jejunojejunostomy in the LEFT upper quadrant. No dilation of the E Ferrante limb or substantial dilation of the excluded stomach. No acute gastrointestinal process. Appendix not visualized though there are no secondary signs to suggest acute appendicitis. Vascular/Lymphatic: Aortic atherosclerosis. No sign of aneurysm. Smooth contour of the IVC. There is no gastrohepatic or hepatoduodenal ligament lymphadenopathy. No retroperitoneal or mesenteric lymphadenopathy. No pelvic sidewall lymphadenopathy. Atherosclerotic changes are mild. Reproductive: Unremarkable. Other: Small midline ventral hernia above the umbilicus contains fat only. No ascites. No free air. Musculoskeletal: No acute bone finding. No destructive bone process. Spinal degenerative changes. Degenerative changes are moderate to marked and worse towards the lower lumbar spine. Review of the MIP images confirms the above findings. IMPRESSION: Negative for pulmonary embolism to the segmental level. Mildly compromised  bolus timing and respiratory motion with some limitation. Small amount of gas in the urinary bladder. Correlate with recent instrumentation. Signs of gastric bypass procedure without signs of obstruction or acute bowel process. Small hiatal hernia. Small midline ventral hernia above the umbilicus contains only fat. Signs of potential liver disease with lobular hepatic contours. 1 cm RIGHT renal AML. Aortic Atherosclerosis (ICD10-I70.0). Electronically Signed   By: Zetta Bills M.D.   On: 10/28/2021 17:34   CT ABDOMEN PELVIS W CONTRAST  Result Date: 10/28/2021 CLINICAL DATA:  Suspected bowel obstruction, chest pain and shortness of breath. 80 year old female. EXAM: CT ANGIOGRAPHY CHEST CT ABDOMEN AND PELVIS WITH CONTRAST TECHNIQUE: Multidetector CT imaging of the chest was performed using the standard protocol during bolus administration of intravenous contrast. Multiplanar CT image reconstructions and MIPs were obtained to evaluate the vascular anatomy. Multidetector CT imaging of the abdomen and pelvis  was performed using the standard protocol during bolus administration of intravenous contrast. CONTRAST:  75mL OMNIPAQUE IOHEXOL 350 MG/ML SOLN COMPARISON:  August 12, 2008 chest CT. FINDINGS: CTA CHEST FINDINGS Cardiovascular: Scattered aortic atherosclerosis. Normal caliber of the thoracic aorta. Normal caliber of the main pulmonary arteries. Main pulmonary artery is moderately well opacified at 245 Hounsfield units. There is some motion at the lung bases which does limit assessment. No signs of pulmonary embolism to the segmental level. Mediastinum/Nodes: No thoracic inlet, axillary, mediastinal or hilar adenopathy. Esophagus grossly normal. Lungs/Pleura: Basilar atelectasis. No effusion. No consolidative process. Musculoskeletal: No acute bone finding. No destructive bone process. Spinal degenerative changes. See below for full musculoskeletal detail about the abdomen and pelvis. Review of the MIP  images confirms the above findings. CT ABDOMEN and PELVIS FINDINGS Hepatobiliary: Liver contour is lobular with fissural widening. Post cholecystectomy. No biliary duct distension. Portal vein is patent. No focal, suspicious hepatic lesion. Pancreas: Normal contour without signs of inflammation. Spleen: Top normal size, no visible lesion. Adrenals/Urinary Tract: Adrenal glands are normal. Symmetric renal enhancement without hydronephrosis. No nephrolithiasis. Small amount of gas in the urinary bladder. Small cysts in the bilateral kidneys. 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 Stomach/Bowel: Signs of gastric bypass procedure. Ingested tablet seen in the dependent portion of the gastric pouch does not changed density from arterial to venous phase or change in size. Small hiatal hernia. Jejunojejunostomy in the LEFT upper quadrant. No dilation of the E Ferrante limb or substantial dilation of the excluded stomach. No acute gastrointestinal process. Appendix not visualized though there are no secondary signs to suggest acute appendicitis. Vascular/Lymphatic: Aortic atherosclerosis. No sign of aneurysm. Smooth contour of the IVC. There is no gastrohepatic or hepatoduodenal ligament lymphadenopathy. No retroperitoneal or mesenteric lymphadenopathy. No pelvic sidewall lymphadenopathy. Atherosclerotic changes are mild. Reproductive: Unremarkable. Other: Small midline ventral hernia above the umbilicus contains fat only. No ascites. No free air. Musculoskeletal: No acute bone finding. No destructive bone process. Spinal degenerative changes. Degenerative changes are moderate to marked and worse towards the lower lumbar spine. Review of the MIP images confirms the above findings. IMPRESSION: Negative for pulmonary embolism to the segmental level. Mildly compromised bolus timing and respiratory motion with some limitation. Small amount of gas in the urinary bladder. Correlate with recent instrumentation. Signs of  gastric bypass procedure without signs of obstruction or acute bowel process. Small hiatal hernia. Small midline ventral hernia above the umbilicus contains only fat. Signs of potential liver disease with lobular hepatic contours. 1 cm RIGHT renal AML. Aortic Atherosclerosis (ICD10-I70.0). Electronically Signed   By: Zetta Bills M.D.   On: 10/28/2021 17:34   DG Chest Portable 1 View  Result Date: 10/31/2021 CLINICAL DATA:  Generalized weakness, new onset palpitations. Evaluate for pneumonia. EXAM: PORTABLE CHEST 1 VIEW COMPARISON:  Prior chest x-ray 10/28/2021 FINDINGS: Stable cardiac and mediastinal contours. No focal airspace opacity. No significant pulmonary edema, pleural effusion or pneumothorax. Similar appearance of left basilar atelectasis versus scarring. No acute osseous abnormality. Surgical changes of right reverse shoulder arthroplasty. IMPRESSION: No active disease. Electronically Signed   By: Jacqulynn Cadet M.D.   On: 10/31/2021 15:07   DG Chest Portable 1 View  Result Date: 10/28/2021 CLINICAL DATA:  Abdominal pain and chest pain. Previous gastric bypass surgery. EXAM: PORTABLE CHEST 1 VIEW COMPARISON:  08/05/2010 FINDINGS: Mild cardiac enlargement. Tortuous aorta. The lungs are clear except for mild scarring or atelectasis at the left lung base. The vascularity is normal. No effusions.  Previous shoulder replacement on the right. IMPRESSION: Mild cardiomegaly. Aortic atherosclerosis. Mild atelectasis or scarring at the left lung base. Electronically Signed   By: Nelson Chimes M.D.   On: 10/28/2021 15:39   VAS [place] LOWER EXTREMITY VENOUS (DVT) (7a-7p)  Result Date: 10/28/2021  Lower Venous DVT Study Patient Name:  ANAIYAH ANGLEMYER  Date of Exam:   10/28/2021 Medical Rec #: [id number]      Accession #:    [id number] Date of Birth: 05-Jan-1941      Patient Gender: F Patient Age:   74 years Exam [facility]  [facility] Procedure:      VAS [place] LOWER EXTREMITY VENOUS (DVT) Referring Phys:  JON KNAPP --------------------------------------------------------------------------------  Indications: Pain, and Swelling.  Risk Factors: Surgery Left knee replacement 10/19/21. Comparison Study: No previous exams Performing Technologist: Jody Hill RVT, RDMS  Examination Guidelines: A complete evaluation includes B-mode imaging, spectral Doppler, color Doppler, and power Doppler as needed of all accessible portions of each vessel. Bilateral testing is considered an integral part of a complete examination. Limited examinations for reoccurring indications may be performed as noted. The reflux portion of the exam is performed with the patient in reverse Trendelenburg.  +-----+---------------+---------+-----------+----------+--------------+ RIGHTCompressibilityPhasicitySpontaneityPropertiesThrombus Aging +-----+---------------+---------+-----------+----------+--------------+ CFV  Full           Yes      Yes                                 +-----+---------------+---------+-----------+----------+--------------+   +---------+---------------+---------+-----------+----------+--------------+ LEFT     CompressibilityPhasicitySpontaneityPropertiesThrombus Aging +---------+---------------+---------+-----------+----------+--------------+ CFV      Full           Yes      Yes                                 +---------+---------------+---------+-----------+----------+--------------+ SFJ      Full                                                        +---------+---------------+---------+-----------+----------+--------------+ FV Prox  Full           Yes      Yes                                 +---------+---------------+---------+-----------+----------+--------------+ FV Mid   Full           Yes      Yes                                 +---------+---------------+---------+-----------+----------+--------------+ FV DistalFull           Yes      Yes                                  +---------+---------------+---------+-----------+----------+--------------+ PFV      Full                                                        +---------+---------------+---------+-----------+----------+--------------+  POP      Full           Yes      Yes                                 +---------+---------------+---------+-----------+----------+--------------+ PTV      Full                                                        +---------+---------------+---------+-----------+----------+--------------+ PERO     Full                                                        +---------+---------------+---------+-----------+----------+--------------+    Summary: RIGHT: - No evidence of common femoral vein obstruction.  LEFT: - There is no evidence of deep vein thrombosis in the lower extremity. - There is no evidence of superficial venous thrombosis.  - No cystic structure found in the popliteal fossa. Subcutaneous irregularity noted by the sonographer in area of knee and calf.  *See table(s) above for measurements and observations. Electronically signed by Jamelle Haring on 10/28/2021 at 4:05:52 PM.    Final     Assessment/Plan 1. History of total left knee replacement Pain is much better controlled now On Dilaudid and Robaxin On Xarelto for Prophylaxis 2. Cellulitis of left lower extremity On Duricef for 5 days Redness resolved  3. Atrial tachycardia (HCC) Lopressor  Zio patch per cardiology  4. Anemia, unspecified type On Iron   5. Slow transit constipation Doing Better  6. Hyperlipidemia LDL goal <100 Continue Statin  7. Hypothyroidism, unspecified type  TSH normal in ED 8 Depression Mood is good on Effexor 9 Hypertension On Cozaar and Now on Lopressor Will follow BP closely   [facility] in Facility shpow Hgb of 7.9 Continue Iron Repeat CBC in 1 week. Creat and TSH were Normal Family/ staff Communication:   Labs/tests ordered:   CBC in 1 week

## 2021-11-03 DIAGNOSIS — R278 Other lack of coordination: Secondary | ICD-10-CM | POA: Diagnosis not present

## 2021-11-03 DIAGNOSIS — M6388 Disorders of muscle in diseases classified elsewhere, other site: Secondary | ICD-10-CM | POA: Diagnosis not present

## 2021-11-03 DIAGNOSIS — M1712 Unilateral primary osteoarthritis, left knee: Secondary | ICD-10-CM | POA: Diagnosis not present

## 2021-11-03 DIAGNOSIS — M62562 Muscle wasting and atrophy, not elsewhere classified, left lower leg: Secondary | ICD-10-CM | POA: Diagnosis not present

## 2021-11-03 DIAGNOSIS — R2689 Other abnormalities of gait and mobility: Secondary | ICD-10-CM | POA: Diagnosis not present

## 2021-11-03 DIAGNOSIS — Z471 Aftercare following joint replacement surgery: Secondary | ICD-10-CM | POA: Diagnosis not present

## 2021-11-04 DIAGNOSIS — R278 Other lack of coordination: Secondary | ICD-10-CM | POA: Diagnosis not present

## 2021-11-04 DIAGNOSIS — M6388 Disorders of muscle in diseases classified elsewhere, other site: Secondary | ICD-10-CM | POA: Diagnosis not present

## 2021-11-04 DIAGNOSIS — Z471 Aftercare following joint replacement surgery: Secondary | ICD-10-CM | POA: Diagnosis not present

## 2021-11-04 DIAGNOSIS — M1712 Unilateral primary osteoarthritis, left knee: Secondary | ICD-10-CM | POA: Diagnosis not present

## 2021-11-04 DIAGNOSIS — M62562 Muscle wasting and atrophy, not elsewhere classified, left lower leg: Secondary | ICD-10-CM | POA: Diagnosis not present

## 2021-11-04 DIAGNOSIS — R2689 Other abnormalities of gait and mobility: Secondary | ICD-10-CM | POA: Diagnosis not present

## 2021-11-05 DIAGNOSIS — R278 Other lack of coordination: Secondary | ICD-10-CM | POA: Diagnosis not present

## 2021-11-05 DIAGNOSIS — M62562 Muscle wasting and atrophy, not elsewhere classified, left lower leg: Secondary | ICD-10-CM | POA: Diagnosis not present

## 2021-11-05 DIAGNOSIS — M1712 Unilateral primary osteoarthritis, left knee: Secondary | ICD-10-CM | POA: Diagnosis not present

## 2021-11-05 DIAGNOSIS — Z471 Aftercare following joint replacement surgery: Secondary | ICD-10-CM | POA: Diagnosis not present

## 2021-11-05 DIAGNOSIS — R2689 Other abnormalities of gait and mobility: Secondary | ICD-10-CM | POA: Diagnosis not present

## 2021-11-05 DIAGNOSIS — M6388 Disorders of muscle in diseases classified elsewhere, other site: Secondary | ICD-10-CM | POA: Diagnosis not present

## 2021-11-06 ENCOUNTER — Non-Acute Institutional Stay (SKILLED_NURSING_FACILITY): Payer: Medicare PPO | Admitting: Adult Health

## 2021-11-06 ENCOUNTER — Encounter: Payer: Self-pay | Admitting: Adult Health

## 2021-11-06 DIAGNOSIS — E1151 Type 2 diabetes mellitus with diabetic peripheral angiopathy without gangrene: Secondary | ICD-10-CM

## 2021-11-06 DIAGNOSIS — I10 Essential (primary) hypertension: Secondary | ICD-10-CM

## 2021-11-06 DIAGNOSIS — M6388 Disorders of muscle in diseases classified elsewhere, other site: Secondary | ICD-10-CM | POA: Diagnosis not present

## 2021-11-06 DIAGNOSIS — Z96652 Presence of left artificial knee joint: Secondary | ICD-10-CM

## 2021-11-06 DIAGNOSIS — M1712 Unilateral primary osteoarthritis, left knee: Secondary | ICD-10-CM

## 2021-11-06 DIAGNOSIS — I471 Supraventricular tachycardia: Secondary | ICD-10-CM

## 2021-11-06 DIAGNOSIS — R002 Palpitations: Secondary | ICD-10-CM

## 2021-11-06 DIAGNOSIS — D508 Other iron deficiency anemias: Secondary | ICD-10-CM

## 2021-11-06 DIAGNOSIS — Z471 Aftercare following joint replacement surgery: Secondary | ICD-10-CM | POA: Diagnosis not present

## 2021-11-06 DIAGNOSIS — M62562 Muscle wasting and atrophy, not elsewhere classified, left lower leg: Secondary | ICD-10-CM | POA: Diagnosis not present

## 2021-11-06 DIAGNOSIS — R2689 Other abnormalities of gait and mobility: Secondary | ICD-10-CM | POA: Diagnosis not present

## 2021-11-06 DIAGNOSIS — E039 Hypothyroidism, unspecified: Secondary | ICD-10-CM

## 2021-11-06 DIAGNOSIS — R278 Other lack of coordination: Secondary | ICD-10-CM | POA: Diagnosis not present

## 2021-11-06 LAB — MAGNESIUM: Magnesium: 2.4

## 2021-11-06 NOTE — Progress Notes (Signed)
Location:   [place] Room Number: 158 Place of Service:  SNF [phone])  Provider: Royal Hawthorn, NP   PCP: [name], DO Patient Care Team: [name], DO as PCP - Gaston Islam, MD as Consulting Physician (Orthopedic Surgery) Marica Otter, [place] as Consulting Physician (Optometry) Sharyn Lull [place] as Consulting Physician (Dentistry) Marica Otter, [facility])  Extended Emergency Contact Information Primary Emergency Contact: [facility] Address: [address]          [place], [place] [address] Johnnette Litter of [place] Phone: [phone] Relation: Daughter  Code Status: Full Code Goals of care:  Advanced Directive information Advanced Directives 11/06/2021  Does Patient Have a Medical Advance Directive? Yes  Type of Advance Directive Living will;Healthcare Power of Attorney  Does patient want to make changes to medical advance directive? No - Patient declined  Copy of [place] in Chart? Yes - validated most recent copy scanned in chart (See row information)  Would patient like information on creating a medical advance directive? -     Allergies  Allergen Reactions   Sulfonamide Derivatives Diarrhea   Cocaine     Other reaction(s): Hallucination Sinus irrigation   Montelukast Sodium     UNKNOWN   Penicillins     Local skin reaction.  Tolerated ancef 07/15/2020 Tolerated Cephalosporin Date: 10/20/21.     Pioglitazone     UNKNOWN    Rosiglitazone Maleate     UNKNOWN   Secobarbital Sodium     UNKNOWN   Codeine Rash   Doxycycline Nausea Only    REACTION: GI UPSET    Chief Complaint  Patient presents with   Discharge Note    Discharge from SNF    HPI:  80 y.o. female seen for discharge from skilled rehab. PMH significant for gastric bypass, anemia, OA, DM II, IBS, depression, HLD, GERD, HTN, and Hypothyroidism. She was in the hospital 10/19/21-10/20/21 for a left  knee replacement and then came to [facility]. Prior to this she lived independently. She was then readmitted 10/28/21-10/30/21 due to abd pain, low bp, and possible knee cellulitis. She was found to have a UTI and treated with Rocephin and then discharged on Cefdinir. Final cultures were negative. CT of the abd and chest during her stay were negative for an acute process and blood cultures were also negative. She had some redness and swelling to her knee  Which resolved. Then she returned to the ER due to muscle aches and palpitations and an EKG showed atrial tachycardia. She was given IV lopressor and started on metoprolol. She is ordered a zio patch which was delivered to her home and she will start that when the gets home. There was concern that she was intolerant to third generation cephalosporins and so she was transitioned to duricef. [place] ready for discharge back to [place]. She is only using tylenol for pain. Bowels are moving well. Appetite adequate.  She has not had any further episodes of tachycardia.   Past Medical History:  Diagnosis Date   Abnormal liver function tests    Allergy    Anxiety    Arthritis    knees, lower back    Asthma    Cancer ([place])    Cataract    left eye growing cataract    Depression    Diabetes mellitus    had gastric bypass DM resolved-    Diverticulosis    GERD (gastroesophageal reflux disease)  Headache    Previous migraines   Heart murmur    History of colon polyps    adenomatous   Hyperlipidemia    past hx- resloved after gastric bypass    Hypertension    Hypothyroidism    IBS (irritable bowel syndrome)    Iron deficiency anemia    Osteoporosis     Past Surgical History:  Procedure Laterality Date   ABDOMINAL HYSTERECTOMY     ABDOMINAL HYSTERECTOMY     APPENDECTOMY     BLADDER REPAIR     BREAST BIOPSY Right    needle core biopsy, benign   CATARACT EXTRACTION Left    CHOLECYSTECTOMY     COLONOSCOPY     CORONARY ANGIOPLASTY      GASTRIC BYPASS     POLYPECTOMY     REVERSE SHOULDER ARTHROPLASTY Right 07/15/2020   Procedure: REVERSE SHOULDER ARTHROPLASTY;  Surgeon: Justice Britain, MD;  Location: [facility];  Service: Orthopedics;  Laterality: Right;  161min   TONSILLECTOMY AND ADENOIDECTOMY     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   Procedure: TOTAL KNEE ARTHROPLASTY;  Surgeon: Gaynelle Arabian, MD;  Location: [facility];  Service: Orthopedics;  Laterality: Left;      reports that she quit smoking about 59 years ago. Her smoking use included cigarettes. She has a 0.60 pack-year smoking history. She has never used smokeless tobacco. She reports current alcohol use. She reports that she does not use drugs. Social History   Socioeconomic History   Marital status: Single    Spouse name: Not on file   Number of children: 1   Years of education: Not on file   Highest education level: Not on file  Occupational History   Occupation: retired Product manager: RETIRED  Tobacco Use   Smoking status: Former    Packs/day: 0.30    Years: 2.00    Pack years: 0.60    Types: Cigarettes    Quit date: 07/11/1962    Years since quitting: 59.3   Smokeless tobacco: Never  Vaping Use   Vaping Use: Never used  Substance and Sexual Activity   Alcohol use: Yes   Drug use: No   Sexual activity: Yes    Birth control/protection: Surgical    Comment: Hysterectomy  Other Topics Concern   Not on file  Social History Narrative   Daily caffeine   Social Determinants of Health   Financial Resource Strain: Not on file  Food Insecurity: Not on file  Transportation Needs: Not on file  Physical Activity: Not on file  Stress: Not on file  Social Connections: Not on file  Intimate Partner Violence: Not on file   Functional Status Survey:    Allergies  Allergen Reactions   Sulfonamide Derivatives Diarrhea   Cocaine     Other reaction(s): Hallucination Sinus irrigation   Montelukast Sodium     UNKNOWN   Penicillins     Local  skin reaction.  Tolerated ancef 07/15/2020 Tolerated Cephalosporin Date: 10/20/21.     Pioglitazone     UNKNOWN    Rosiglitazone Maleate     UNKNOWN   Secobarbital Sodium     UNKNOWN   Codeine Rash   Doxycycline Nausea Only    REACTION: GI UPSET    Pertinent  Health Maintenance Due  Topic Date Due   OPHTHALMOLOGY EXAM  07/21/2018   FOOT EXAM  08/05/2018   HEMOGLOBIN A1C  02/14/2022   INFLUENZA VACCINE  Completed   DEXA SCAN  Completed  COLONOSCOPY (Pts 45-70yrs Insurance coverage will need to be confirmed)  Discontinued    Medications: Allergies as of 11/06/2021       Reactions   Sulfonamide Derivatives Diarrhea   Cocaine    Other reaction(s): Hallucination Sinus irrigation   Montelukast Sodium    UNKNOWN   Penicillins    Local skin reaction. Tolerated ancef 07/15/2020 Tolerated Cephalosporin Date: 10/20/21.   Pioglitazone    UNKNOWN    Rosiglitazone Maleate    UNKNOWN   Secobarbital Sodium    UNKNOWN   Codeine Rash   Doxycycline Nausea Only   REACTION: GI UPSET        Medication List        Accurate as of November 06, 2021  9:25 AM. If you have any questions, ask your nurse or doctor.          acetaminophen 650 MG CR tablet Commonly known as: TYLENOL Take 650 mg by mouth every 8 (eight) hours as needed for pain.   albuterol 108 (90 Base) MCG/ACT inhaler Commonly known as: Proventil HFA Inhale 2 puffs into the lungs every 6 (six) hours as needed. What changed: reasons to take this   aspirin EC 81 MG tablet Take 81 mg by mouth daily. Swallow whole.   diclofenac Sodium 1 % Gel Commonly known as: VOLTAREN Apply 2 g topically 4 (four) times daily. Do not apply to open area, apply to knee pain area   docusate sodium 100 MG capsule Commonly known as: COLACE Take 100 mg by mouth 2 (two) times daily as needed for mild constipation.   ferrous sulfate 325 (65 FE) MG tablet Take 325 mg by mouth daily with breakfast.   HYDROmorphone 2 MG  tablet Commonly known as: DILAUDID Take 1-2 tablets (2-4 mg total) by mouth every 6 (six) hours as needed for up to 15 days for severe pain or moderate pain.   losartan 50 MG tablet Commonly known as: COZAAR Take 1 tablet (50 mg total) by mouth daily.   methocarbamol 500 MG tablet Commonly known as: ROBAXIN Take 1 tablet (500 mg total) by mouth every 6 (six) hours as needed for muscle spasms.   metoprolol tartrate 25 MG tablet Commonly known as: LOPRESSOR Take 1 tablet (25 mg total) by mouth 2 (two) times daily.   pantoprazole 40 MG tablet Commonly known as: PROTONIX Take 1 tablet (40 mg total) by mouth daily.   polyethylene glycol 17 g packet Commonly known as: MIRALAX / GLYCOLAX Take 17 g by mouth daily as needed for mild constipation.   Prolia 60 MG/ML Sosy injection Generic drug: denosumab Inject 60 mg into the skin every 6 (six) months. Deliver to [address], [place] [address]   rivaroxaban 10 MG Tabs tablet Commonly known as: XARELTO Take 1 tablet (10 mg total) by mouth daily with breakfast for 20 days.   levothyroxine 150 MCG tablet Commonly known as: SYNTHROID Take 150 mcg by mouth. Once A Day on Sun, Tue, Thu, Sat What changed: Another medication with the same name was removed. Continue taking this medication, and follow the directions you see here. Changed by: Royal Hawthorn, NP   Synthroid 137 MCG tablet Generic drug: levothyroxine TAKE 1 TABLET ONCE DAILY ON MONDAY, WEDNESDAY, AND FRIDAY. TAKE 150MCG DAILY ALL OTHER DAYS. What changed: Another medication with the same name was removed. Continue taking this medication, and follow the directions you see here. Changed by: Royal Hawthorn, NP   venlafaxine XR 75 MG 24 hr  capsule Commonly known as: EFFEXOR-XR TAKE 1 CAPSULE ONCE DAILY WITH BREAKFAST. What changed: See the new instructions.        Review of Systems  Constitutional:  Positive for activity change. Negative for appetite  change, chills, diaphoresis, fatigue, fever and unexpected weight change.  HENT:  Negative for congestion.   Respiratory:  Negative for cough, shortness of breath and wheezing.   Cardiovascular:  Positive for leg swelling. Negative for chest pain and palpitations.  Gastrointestinal:  Negative for abdominal distention, abdominal pain, constipation and diarrhea.  Genitourinary:  Negative for difficulty urinating and dysuria.  Musculoskeletal:  Positive for gait problem and joint swelling. Negative for arthralgias, back pain and myalgias.  Skin:  Positive for wound.  Neurological:  Negative for dizziness, tremors, seizures, syncope, facial asymmetry, speech difficulty, weakness, light-headedness, numbness and headaches.  Psychiatric/Behavioral:  Negative for agitation, behavioral problems and confusion.    Vitals:   11/06/21 0856  BP: 135/64  Pulse: 66  Resp: 16  Temp: 98.6 F (37 C)  SpO2: 100%  Weight: 166 lb 8 oz (75.5 kg)  Height: 5\' 7"  (1.702 m)   Body mass index is 26.08 kg/m. Physical Exam Vitals and nursing note reviewed.  Constitutional:      General: She is not in acute distress.    Appearance: She is not diaphoretic.  HENT:     Head: Normocephalic and atraumatic.  Neck:     Vascular: No JVD.  Cardiovascular:     Rate and Rhythm: Normal rate and regular rhythm.     Heart sounds: No murmur heard. Pulmonary:     Effort: Pulmonary effort is normal. No respiratory distress.     Breath sounds: Normal breath sounds. No wheezing.  Abdominal:     General: Abdomen is flat. Bowel sounds are normal. There is no distension.     Palpations: Abdomen is soft.  Musculoskeletal:     Left lower leg: Edema (+1) present.  Skin:    General: Skin is warm and dry.     Comments: Left knee incision with surrounding bruising. NO erythema or purulent drainage.   Neurological:     Mental Status: She is alert and oriented to person, place, and time.  Psychiatric:        Mood and Affect:  Mood normal.    Labs reviewed: Basic Metabolic Panel: Recent Labs    10/29/21 0241 10/30/21 0532 10/31/21 1006 11/02/21 0000  NA 136 135 135 142  K 4.9 4.0 4.2 4.3  CL 107 105 102 105  CO2 23 23 23  24*  GLUCOSE 122* 105* 141*  --   BUN 12 10 5* 8  CREATININE 0.68 0.53 0.59 0.6  0.6  CALCIUM 7.3* 7.7* 8.6* 8.4*  8.4*  MG 2.5* 2.4  --   --    Liver Function Tests: Recent Labs    10/28/21 1309 10/29/21 0241 10/31/21 1006  AST 35 27 17  ALT 20 16 15   ALKPHOS 84 52 60  BILITOT 1.6* 0.9 1.4*  PROT 6.6 5.7* 6.6  ALBUMIN 3.4* 3.0* 3.3*   Recent Labs    10/28/21 1309  LIPASE 36   No results for input(s): AMMONIA in the last 8760 hours. CBC: Recent Labs    04/28/21 1407 10/09/21 1118 10/28/21 1309 10/29/21 0241 10/29/21 1315 10/30/21 0532 10/31/21 1006 11/02/21 0000  WBC 5.0   < > 7.8   < > 9.8 6.7 12.8* 6.0  NEUTROABS 3.3  --  7.1  --   --   --  11.6*  --   HGB 11.2*   < > 8.5*   < > 7.4* 7.1* 8.8* 7.9*  HCT 33.4*   < > 27.9*   < > 23.8* 23.2* 28.8* 25*  MCV 91.7   < > 99.6   < > 98.3 98.7 96.6  --   PLT 186.0   < > 120*   < > 147* 154 236 264   < > = values in this interval not displayed.   Cardiac Enzymes: No results for input(s): CKTOTAL, CKMB, CKMBINDEX, TROPONINI in the last 8760 hours. BNP: Invalid input(s): POCBNP CBG: Recent Labs    10/09/21 1046 10/19/21 0614 10/19/21 0938  GLUCAP 106* 120* 107*    Procedures and Imaging Studies During Stay: DG Abdomen 1 View  Result Date: 10/28/2021 CLINICAL DATA:  Abdominal pain. EXAM: ABDOMEN - 1 VIEW COMPARISON:  None. FINDINGS: The bowel gas pattern is normal. No radio-opaque calculi or other significant radiographic abnormality are seen. IMPRESSION: Negative. Electronically Signed   By: Marijo Conception M.D.   On: 10/28/2021 15:39   CT Angio Chest Pulmonary Embolism (PE) W or WO Contrast  Result Date: 10/28/2021 CLINICAL DATA:  Suspected bowel obstruction, chest pain and shortness of breath.  80 year old female. EXAM: CT ANGIOGRAPHY CHEST CT ABDOMEN AND PELVIS WITH CONTRAST TECHNIQUE: Multidetector CT imaging of the chest was performed using the standard protocol during bolus administration of intravenous contrast. Multiplanar CT image reconstructions and MIPs were obtained to evaluate the vascular anatomy. Multidetector CT imaging of the abdomen and pelvis was performed using the standard protocol during bolus administration of intravenous contrast. CONTRAST:  73mL OMNIPAQUE IOHEXOL 350 MG/ML SOLN COMPARISON:  August 12, 2008 chest CT. FINDINGS: CTA CHEST FINDINGS Cardiovascular: Scattered aortic atherosclerosis. Normal caliber of the thoracic aorta. Normal caliber of the main pulmonary arteries. Main pulmonary artery is moderately well opacified at 245 Hounsfield units. There is some motion at the lung bases which does limit assessment. No signs of pulmonary embolism to the segmental level. Mediastinum/Nodes: No thoracic inlet, axillary, mediastinal or hilar adenopathy. Esophagus grossly normal. Lungs/Pleura: Basilar atelectasis. No effusion. No consolidative process. Musculoskeletal: No acute bone finding. No destructive bone process. Spinal degenerative changes. See below for full musculoskeletal detail about the abdomen and pelvis. Review of the MIP images confirms the above findings. CT ABDOMEN and PELVIS FINDINGS Hepatobiliary: Liver contour is lobular with fissural widening. Post cholecystectomy. No biliary duct distension. Portal vein is patent. No focal, suspicious hepatic lesion. Pancreas: Normal contour without signs of inflammation. Spleen: Top normal size, no visible lesion. Adrenals/Urinary Tract: Adrenal glands are normal. Symmetric renal enhancement without hydronephrosis. No nephrolithiasis. Small amount of gas in the urinary bladder. Small cysts in the bilateral kidneys. 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 Stomach/Bowel: Signs of gastric bypass procedure. Ingested  tablet seen in the dependent portion of the gastric pouch does not changed density from arterial to venous phase or change in size. Small hiatal hernia. Jejunojejunostomy in the LEFT upper quadrant. No dilation of the E Ferrante limb or substantial dilation of the excluded stomach. No acute gastrointestinal process. Appendix not visualized though there are no secondary signs to suggest acute appendicitis. Vascular/Lymphatic: Aortic atherosclerosis. No sign of aneurysm. Smooth contour of the IVC. There is no gastrohepatic or hepatoduodenal ligament lymphadenopathy. No retroperitoneal or mesenteric lymphadenopathy. No pelvic sidewall lymphadenopathy. Atherosclerotic changes are mild. Reproductive: Unremarkable. Other: Small midline ventral hernia above the umbilicus contains fat only. No ascites. No free air. Musculoskeletal: No acute bone finding.  No destructive bone process. Spinal degenerative changes. Degenerative changes are moderate to marked and worse towards the lower lumbar spine. Review of the MIP images confirms the above findings. IMPRESSION: Negative for pulmonary embolism to the segmental level. Mildly compromised bolus timing and respiratory motion with some limitation. Small amount of gas in the urinary bladder. Correlate with recent instrumentation. Signs of gastric bypass procedure without signs of obstruction or acute bowel process. Small hiatal hernia. Small midline ventral hernia above the umbilicus contains only fat. Signs of potential liver disease with lobular hepatic contours. 1 cm RIGHT renal AML. Aortic Atherosclerosis (ICD10-I70.0). Electronically Signed   By: Zetta Bills M.D.   On: 10/28/2021 17:34   CT ABDOMEN PELVIS W CONTRAST  Result Date: 10/28/2021 CLINICAL DATA:  Suspected bowel obstruction, chest pain and shortness of breath. 80 year old female. EXAM: CT ANGIOGRAPHY CHEST CT ABDOMEN AND PELVIS WITH CONTRAST TECHNIQUE: Multidetector CT imaging of the chest was performed using  the standard protocol during bolus administration of intravenous contrast. Multiplanar CT image reconstructions and MIPs were obtained to evaluate the vascular anatomy. Multidetector CT imaging of the abdomen and pelvis was performed using the standard protocol during bolus administration of intravenous contrast. CONTRAST:  55mL OMNIPAQUE IOHEXOL 350 MG/ML SOLN COMPARISON:  August 12, 2008 chest CT. FINDINGS: CTA CHEST FINDINGS Cardiovascular: Scattered aortic atherosclerosis. Normal caliber of the thoracic aorta. Normal caliber of the main pulmonary arteries. Main pulmonary artery is moderately well opacified at 245 Hounsfield units. There is some motion at the lung bases which does limit assessment. No signs of pulmonary embolism to the segmental level. Mediastinum/Nodes: No thoracic inlet, axillary, mediastinal or hilar adenopathy. Esophagus grossly normal. Lungs/Pleura: Basilar atelectasis. No effusion. No consolidative process. Musculoskeletal: No acute bone finding. No destructive bone process. Spinal degenerative changes. See below for full musculoskeletal detail about the abdomen and pelvis. Review of the MIP images confirms the above findings. CT ABDOMEN and PELVIS FINDINGS Hepatobiliary: Liver contour is lobular with fissural widening. Post cholecystectomy. No biliary duct distension. Portal vein is patent. No focal, suspicious hepatic lesion. Pancreas: Normal contour without signs of inflammation. Spleen: Top normal size, no visible lesion. Adrenals/Urinary Tract: Adrenal glands are normal. Symmetric renal enhancement without hydronephrosis. No nephrolithiasis. Small amount of gas in the urinary bladder. Small cysts in the bilateral kidneys. 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 Stomach/Bowel: Signs of gastric bypass procedure. Ingested tablet seen in the dependent portion of the gastric pouch does not changed density from arterial to venous phase or change in size. Small hiatal hernia.  Jejunojejunostomy in the LEFT upper quadrant. No dilation of the E [place]ante limb or substantial dilation of the excluded stomach. No acute gastrointestinal process. Appendix not visualized though there are no secondary signs to suggest acute appendicitis. Vascular/Lymphatic: Aortic atherosclerosis. No sign of aneurysm. Smooth contour of the IVC. There is no gastrohepatic or hepatoduodenal ligament lymphadenopathy. No retroperitoneal or mesenteric lymphadenopathy. No pelvic sidewall lymphadenopathy. Atherosclerotic changes are mild. Reproductive: Unremarkable. Other: Small midline ventral hernia above the umbilicus contains fat only. No ascites. No free air. Musculoskeletal: No acute bone finding. No destructive bone process. Spinal degenerative changes. Degenerative changes are moderate to marked and worse towards the lower lumbar spine. Review of the MIP images confirms the above findings. IMPRESSION: Negative for pulmonary embolism to the segmental level. Mildly compromised bolus timing and respiratory motion with some limitation. Small amount of gas in the urinary bladder. Correlate with recent instrumentation. Signs of gastric bypass procedure without signs of obstruction or acute  bowel process. Small hiatal hernia. Small midline ventral hernia above the umbilicus contains only fat. Signs of potential liver disease with lobular hepatic contours. 1 cm RIGHT renal AML. Aortic Atherosclerosis (ICD10-I70.0). Electronically Signed   By: Zetta Bills M.D.   On: 10/28/2021 17:34   DG Chest Portable 1 View  Result Date: 10/31/2021 CLINICAL DATA:  Generalized weakness, new onset palpitations. Evaluate for pneumonia. EXAM: PORTABLE CHEST 1 VIEW COMPARISON:  Prior chest x-ray 10/28/2021 FINDINGS: Stable cardiac and mediastinal contours. No focal airspace opacity. No significant pulmonary edema, pleural effusion or pneumothorax. Similar appearance of left basilar atelectasis versus scarring. No acute osseous  abnormality. Surgical changes of right reverse shoulder arthroplasty. IMPRESSION: No active disease. Electronically Signed   By: Jacqulynn Cadet M.D.   On: 10/31/2021 15:07   DG Chest Portable 1 View  Result Date: 10/28/2021 CLINICAL DATA:  Abdominal pain and chest pain. Previous gastric bypass surgery. EXAM: PORTABLE CHEST 1 VIEW COMPARISON:  08/05/2010 FINDINGS: Mild cardiac enlargement. Tortuous aorta. The lungs are clear except for mild scarring or atelectasis at the left lung base. The vascularity is normal. No effusions. Previous shoulder replacement on the right. IMPRESSION: Mild cardiomegaly. Aortic atherosclerosis. Mild atelectasis or scarring at the left lung base. Electronically Signed   By: Nelson Chimes M.D.   On: 10/28/2021 15:39   VAS [place] LOWER EXTREMITY VENOUS (DVT) (7a-7p)  Result Date: 10/28/2021  Lower Venous DVT Study Patient Name:  PEREL HAUSCHILD  Date of Exam:   10/28/2021 Medical Rec #: [id number]      Accession #:    [id number] Date of Birth: [birth year]      Patient Gender: F Patient Age:   22 years [facility]am [facility] Procedure:      VAS [place] LOWER EXTREMITY VENOUS (DVT) Referring Phys: JON KNAPP --------------------------------------------------------------------------------  Indications: Pain, and Swelling.  Risk Factors: Surgery Left knee replacement 10/19/21. Comparison Study: No previous exams Performing Technologist: Jody Hill RVT, RDMS  Examination Guidelines: A complete evaluation includes B-mode imaging, spectral Doppler, color Doppler, and power Doppler as needed of all accessible portions of each vessel. Bilateral testing is considered an integral part of a complete examination. Limited examinations for reoccurring indications may be performed as noted. The reflux portion of the exam is performed with the patient in reverse Trendelenburg.  +-----+---------------+---------+-----------+----------+--------------+  RIGHTCompressibilityPhasicitySpontaneityPropertiesThrombus Aging +-----+---------------+---------+-----------+----------+--------------+ CFV  Full           Yes      Yes                                 +-----+---------------+---------+-----------+----------+--------------+   +---------+---------------+---------+-----------+----------+--------------+ LEFT     CompressibilityPhasicitySpontaneityPropertiesThrombus Aging +---------+---------------+---------+-----------+----------+--------------+ CFV      Full           Yes      Yes                                 +---------+---------------+---------+-----------+----------+--------------+ SFJ      Full                                                        +---------+---------------+---------+-----------+----------+--------------+ FV Prox  Full  Yes      Yes                                 +---------+---------------+---------+-----------+----------+--------------+ FV Mid   Full           Yes      Yes                                 +---------+---------------+---------+-----------+----------+--------------+ FV DistalFull           Yes      Yes                                 +---------+---------------+---------+-----------+----------+--------------+ PFV      Full                                                        +---------+---------------+---------+-----------+----------+--------------+ POP      Full           Yes      Yes                                 +---------+---------------+---------+-----------+----------+--------------+ PTV      Full                                                        +---------+---------------+---------+-----------+----------+--------------+ PERO     Full                                                        +---------+---------------+---------+-----------+----------+--------------+    Summary: RIGHT: - No evidence of common femoral vein  obstruction.  LEFT: - There is no evidence of deep vein thrombosis in the lower extremity. - There is no evidence of superficial venous thrombosis.  - No cystic structure found in the popliteal fossa. Subcutaneous irregularity noted by the sonographer in area of knee and calf.  *See table(s) above for measurements and observations. Electronically signed by Jamelle Haring on 10/28/2021 at 4:05:52 PM.    Final     Assessment/Plan:    1. Primary osteoarthritis of left knee Led to #2  2. S/P total knee replacement, left WBAT  Progressing and ready for d/c F/U with ortho Do not submerge incision in water Complete xarelto through 12/13 then begin asa 81 mg x 3 weeks  3. Other iron deficiency anemia Lab Results  Component Value Date   HGB 7.9 (A) 11/02/2021   Continue iron, if it begins to upset her stomach she can change to three times weekly. She needs to f/u with her PCP. She does have a hx of anemia, reports she takes b12 and hx of gastric bypass.   4. Essential hypertension Controlled Continue losartan   5. DM (diabetes mellitus) type  II, controlled, with peripheral vascular disorder ([place]) Diet controlled.   6. Acquired hypothyroidism Continue Synthroid   7. Atrial tachycardia (HCC) Continue metoprolol Getting zio patch in the mail.  F/U with cardiology    Patient is being discharged with the following home health services:  PT  Patient is being discharged with the following durable medical equipment:  has walker   Patient has been advised to f/u with their PCP in 1-2 weeks to bring them up to date on their rehab stay.  Social services at facility was responsible for arranging this appointment.  Pt was provided with a 30 day supply of prescriptions for medications and refills must be obtained from their PCP.  For controlled substances, a more limited supply may be provided adequate until PCP appointment only.  Future labs/tests needed:  CBC

## 2021-11-09 DIAGNOSIS — M6388 Disorders of muscle in diseases classified elsewhere, other site: Secondary | ICD-10-CM | POA: Diagnosis not present

## 2021-11-09 DIAGNOSIS — M1712 Unilateral primary osteoarthritis, left knee: Secondary | ICD-10-CM | POA: Diagnosis not present

## 2021-11-09 DIAGNOSIS — M62562 Muscle wasting and atrophy, not elsewhere classified, left lower leg: Secondary | ICD-10-CM | POA: Diagnosis not present

## 2021-11-09 DIAGNOSIS — R278 Other lack of coordination: Secondary | ICD-10-CM | POA: Diagnosis not present

## 2021-11-09 DIAGNOSIS — R2689 Other abnormalities of gait and mobility: Secondary | ICD-10-CM | POA: Diagnosis not present

## 2021-11-09 DIAGNOSIS — Z471 Aftercare following joint replacement surgery: Secondary | ICD-10-CM | POA: Diagnosis not present

## 2021-11-10 DIAGNOSIS — R2689 Other abnormalities of gait and mobility: Secondary | ICD-10-CM | POA: Diagnosis not present

## 2021-11-10 DIAGNOSIS — R278 Other lack of coordination: Secondary | ICD-10-CM | POA: Diagnosis not present

## 2021-11-10 DIAGNOSIS — Z471 Aftercare following joint replacement surgery: Secondary | ICD-10-CM | POA: Diagnosis not present

## 2021-11-10 DIAGNOSIS — M62562 Muscle wasting and atrophy, not elsewhere classified, left lower leg: Secondary | ICD-10-CM | POA: Diagnosis not present

## 2021-11-10 DIAGNOSIS — M1712 Unilateral primary osteoarthritis, left knee: Secondary | ICD-10-CM | POA: Diagnosis not present

## 2021-11-11 DIAGNOSIS — M6388 Disorders of muscle in diseases classified elsewhere, other site: Secondary | ICD-10-CM | POA: Diagnosis not present

## 2021-11-11 DIAGNOSIS — Z471 Aftercare following joint replacement surgery: Secondary | ICD-10-CM | POA: Diagnosis not present

## 2021-11-11 DIAGNOSIS — R278 Other lack of coordination: Secondary | ICD-10-CM | POA: Diagnosis not present

## 2021-11-11 DIAGNOSIS — M1712 Unilateral primary osteoarthritis, left knee: Secondary | ICD-10-CM | POA: Diagnosis not present

## 2021-11-12 ENCOUNTER — Inpatient Hospital Stay: Payer: Medicare PPO | Admitting: Family Medicine

## 2021-11-12 DIAGNOSIS — R278 Other lack of coordination: Secondary | ICD-10-CM | POA: Diagnosis not present

## 2021-11-12 DIAGNOSIS — R2689 Other abnormalities of gait and mobility: Secondary | ICD-10-CM | POA: Diagnosis not present

## 2021-11-12 DIAGNOSIS — M62562 Muscle wasting and atrophy, not elsewhere classified, left lower leg: Secondary | ICD-10-CM | POA: Diagnosis not present

## 2021-11-12 DIAGNOSIS — Z471 Aftercare following joint replacement surgery: Secondary | ICD-10-CM | POA: Diagnosis not present

## 2021-11-12 DIAGNOSIS — M1712 Unilateral primary osteoarthritis, left knee: Secondary | ICD-10-CM | POA: Diagnosis not present

## 2021-11-13 ENCOUNTER — Telehealth: Payer: Self-pay | Admitting: Pharmacy Technician

## 2021-11-13 DIAGNOSIS — R278 Other lack of coordination: Secondary | ICD-10-CM | POA: Diagnosis not present

## 2021-11-13 DIAGNOSIS — Z471 Aftercare following joint replacement surgery: Secondary | ICD-10-CM | POA: Diagnosis not present

## 2021-11-13 DIAGNOSIS — M1712 Unilateral primary osteoarthritis, left knee: Secondary | ICD-10-CM | POA: Diagnosis not present

## 2021-11-13 DIAGNOSIS — M6388 Disorders of muscle in diseases classified elsewhere, other site: Secondary | ICD-10-CM | POA: Diagnosis not present

## 2021-11-13 NOTE — Telephone Encounter (Signed)
Submitted a Prior Authorization request to [facility] for [place] via CoverMyMeds. Will update once we receive a response.   (Key: [id number]) - [id number]

## 2021-11-16 ENCOUNTER — Other Ambulatory Visit (HOSPITAL_COMMUNITY): Payer: Self-pay

## 2021-11-16 DIAGNOSIS — M62562 Muscle wasting and atrophy, not elsewhere classified, left lower leg: Secondary | ICD-10-CM | POA: Diagnosis not present

## 2021-11-16 DIAGNOSIS — R2689 Other abnormalities of gait and mobility: Secondary | ICD-10-CM | POA: Diagnosis not present

## 2021-11-16 DIAGNOSIS — Z471 Aftercare following joint replacement surgery: Secondary | ICD-10-CM | POA: Diagnosis not present

## 2021-11-16 DIAGNOSIS — M1712 Unilateral primary osteoarthritis, left knee: Secondary | ICD-10-CM | POA: Diagnosis not present

## 2021-11-16 DIAGNOSIS — R278 Other lack of coordination: Secondary | ICD-10-CM | POA: Diagnosis not present

## 2021-11-16 NOTE — Telephone Encounter (Signed)
Received notification from [facility] that Stacy Ayers is available without authorization. Patient can fill through [facility] as before  Per test claim, copay is $64.  Knox Saliva, PharmD, MPH, BCPS Clinical Pharmacist (Rheumatology and Pulmonology)

## 2021-11-17 ENCOUNTER — Ambulatory Visit: Payer: Medicare PPO | Admitting: Family Medicine

## 2021-11-17 ENCOUNTER — Encounter: Payer: Self-pay | Admitting: Family Medicine

## 2021-11-17 ENCOUNTER — Ambulatory Visit: Payer: Medicare PPO | Attending: Internal Medicine

## 2021-11-17 VITALS — BP 126/70 | HR 81 | Temp 98.8°F | Resp 20 | Ht 67.0 in | Wt 169.8 lb

## 2021-11-17 DIAGNOSIS — D500 Iron deficiency anemia secondary to blood loss (chronic): Secondary | ICD-10-CM | POA: Diagnosis not present

## 2021-11-17 DIAGNOSIS — Z23 Encounter for immunization: Secondary | ICD-10-CM

## 2021-11-17 LAB — CBC WITH DIFFERENTIAL/PLATELET
Basophils Absolute: 0 10*3/uL (ref 0.0–0.1)
Basophils Relative: 1.3 % (ref 0.0–3.0)
Eosinophils Absolute: 0.1 10*3/uL (ref 0.0–0.7)
Eosinophils Relative: 2.3 % (ref 0.0–5.0)
HCT: 30.7 % — ABNORMAL LOW (ref 36.0–46.0)
Hemoglobin: 10 g/dL — ABNORMAL LOW (ref 12.0–15.0)
Lymphocytes Relative: 27.5 % (ref 12.0–46.0)
Lymphs Abs: 1.1 10*3/uL (ref 0.7–4.0)
MCHC: 32.4 g/dL (ref 30.0–36.0)
MCV: 96.1 fl (ref 78.0–100.0)
Monocytes Absolute: 0.3 10*3/uL (ref 0.1–1.0)
Monocytes Relative: 6.7 % (ref 3.0–12.0)
Neutro Abs: 2.4 10*3/uL (ref 1.4–7.7)
Neutrophils Relative %: 62.2 % (ref 43.0–77.0)
Platelets: 231 10*3/uL (ref 150.0–400.0)
RBC: 3.2 Mil/uL — ABNORMAL LOW (ref 3.87–5.11)
RDW: 18.4 % — ABNORMAL HIGH (ref 11.5–15.5)
WBC: 3.9 10*3/uL — ABNORMAL LOW (ref 4.0–10.5)

## 2021-11-17 LAB — IBC PANEL
Iron: 64 ug/dL (ref 42–145)
Saturation Ratios: 17.6 % — ABNORMAL LOW (ref 20.0–50.0)
TIBC: 364 ug/dL (ref 250.0–450.0)
Transferrin: 260 mg/dL (ref 212.0–360.0)

## 2021-11-17 NOTE — Patient Instructions (Signed)
Iron Deficiency Anemia, Adult Iron deficiency anemia is a condition in which the concentration of red blood cells or hemoglobin in the blood is below normal because of too little iron. Hemoglobin is a substance in red blood cells that carries oxygen to the body's tissues. When the concentration of red blood cells or hemoglobin is too low, not enough oxygen reaches these tissues. Iron deficiency anemia is usually long-lasting, and it develops over time. It may or may not cause symptoms. It is a common type of anemia. What are the causes? This condition may be caused by: Not enough iron in the diet. Abnormal absorption in the gut. Increased need for iron because of pregnancy or heavy menstrual periods, for females. Cancers of the gastrointestinal system, such as colon cancer. Blood loss caused by bleeding in the intestine. This may be from a gastrointestinal condition like Crohn's disease. Frequent blood draws, such as from blood donation. What increases the risk? The following factors may make you more likely to develop this condition: Being pregnant. Being a teenage girl going through a growth spurt. What are the signs or symptoms? Symptoms of this condition may include: Pale skin, lips, and nail beds. Weakness, dizziness, and getting tired easily. Headache. Shortness of breath when moving or exercising. Cold hands and feet. Fast or irregular heartbeat. Irritability or rapid breathing. These are more common in severe anemia. Mild anemia may not cause any symptoms. How is this diagnosed? This condition is diagnosed based on: Your medical history. A physical exam. Blood tests. You may have additional tests to find the underlying cause of your anemia, such as: Testing for blood in the stool (fecal occult blood test). A procedure to see inside your colon and rectum (colonoscopy). A procedure to see inside your esophagus and stomach (endoscopy). A test in which cells are removed from  bone marrow (bone marrow aspiration) or fluid is removed from the bone marrow to be examined. This is rarely needed. How is this treated? This condition is treated by correcting the cause of your iron deficiency. Treatment may involve: Adding iron-rich foods to your diet. Taking iron supplements. If you are pregnant or breastfeeding, you may need to take extra iron because your normal diet usually does not provide the amount of iron that you need. Increasing vitamin C intake. Vitamin C helps your body absorb iron. Your health care provider may recommend that you take iron supplements along with a glass of orange juice or a vitamin C supplement. Medicines to make heavy menstrual flow lighter. Surgery. You may need repeat blood tests to determine whether treatment is working. If the treatment does not seem to be working, you may need more tests. Follow these instructions at home: Medicines Take over-the-counter and prescription medicines only as told by your health care provider. This includes iron supplements and vitamins. For the best iron absorption, you should take iron supplements when your stomach is empty. If you cannot tolerate them on an empty stomach, you may need to take them with food. Do not drink milk or take antacids at the same time as your iron supplements. Milk and antacids may interfere with iron absorption. Iron supplements may turn stool (feces) a darker color and it may appear black. If you cannot tolerate taking iron supplements by mouth, talk with your health care provider about taking them through an IV or through an injection into a muscle. Eating and drinking  Talk with your health care provider before changing your diet. He or she may recommend   that you eat foods that contain a lot of iron, such as: Liver. Low-fat (lean) beef. Breads and cereals that have iron added to them (are fortified). Eggs. Dried fruit. Dark green, leafy vegetables. To help your body use the  iron from iron-rich foods, eat those foods at the same time as fresh fruits and vegetables that are high in vitamin C. Foods that are high in vitamin C include: Oranges. Peppers. Tomatoes. Mangoes. Drink enough fluid to keep your urine pale yellow. Managing constipation If you are taking an iron supplement, it may cause constipation. To prevent or treat constipation, you may need to: Take over-the-counter or prescription medicines. Eat foods that are high in fiber, such as beans, whole grains, and fresh fruits and vegetables. Limit foods that are high in fat and processed sugars, such as fried or sweet foods. General instructions Return to your normal activities as told by your health care provider. Ask your health care provider what activities are safe for you. Practice good hygiene. Anemia can make you more prone to illness and infection. Keep all follow-up visits as told by your health care provider. This is important. Contact a health care provider if you: Feel nauseous or you vomit. Feel weak. Have unexplained sweating. Develop symptoms of constipation, such as: Having fewer than three bowel movements a week. Straining to have a bowel movement. Having stools that are hard, dry, or larger than normal. Feeling full or bloated. Pain in the lower abdomen. Not feeling relief after having a bowel movement. Get help right away if you: Faint. If this happens, do not drive yourself to the hospital. Have chest pain. Have shortness of breath that: Is severe. Gets worse with physical activity. Have an irregular or rapid heartbeat. Become light-headed when getting up from a sitting or lying down position. These symptoms may represent a serious problem that is an emergency. Do not wait to see if the symptoms will go away. Get medical help right away. Call your local emergency services (911 in the [place] Do not drive yourself to the hospital. Summary Iron deficiency anemia is a condition in  which the concentration of red blood cells or hemoglobin in the blood is below normal because of too little iron. This condition is treated by correcting the cause of your iron deficiency. Take over-the-counter and prescription medicines only as told by your health care provider. This includes iron supplements and vitamins. To help your body use the iron from iron-rich foods, eat those foods at the same time as fresh fruits and vegetables that are high in vitamin C. Get help right away if you have shortness of breath that gets worse with physical activity. This information is not intended to replace advice given to you by your health care provider. Make sure you discuss any questions you have with your health care provider. Document Revised: 07/24/2019 Document Reviewed: 07/24/2019 Elsevier Patient Education  2022 Elsevier Inc.  

## 2021-11-17 NOTE — Progress Notes (Signed)
Subjective:   By signing my name below, I, Stacy Ayers, attest that this documentation has been prepared under the direction and in the presence of Dr. Roma Schanz, DO. 11/17/2021     Patient ID: Stacy Ayers, female    DOB: 1941-04-26, 80 y.o.   MRN: [id number]  Chief Complaint  Patient presents with   Hospitalization Follow-up    HPI Patient is in today for a Rehab/hospital follow up visit.  She was admitted to ED while in rehab on 10/31/2021 for Tendinitis. They found she was anemic at the time and was given iron supplements. She was also continuing her physical and occupational therapy.   She continues having swelling in her knee at this time. She is continuing taking iron supplements and denies having any constipation or abdominal pain while taking it. She reports her bowel movements are green. She denies having any black bowel movements.  She is wearing a heart monitor and is having it removed this Friday. She reports having a mild episode of tachycardia once while wearing, otherwise there were no other issues.    Past Medical History:  Diagnosis Date   Abnormal liver function tests    Allergy    Anxiety    Arthritis    knees, lower back    Asthma    Cancer ([place])    Cataract    left eye growing cataract    Depression    Diabetes mellitus    had gastric bypass DM resolved-    Diverticulosis    GERD (gastroesophageal reflux disease)    Headache    Previous migraines   Heart murmur    History of colon polyps    adenomatous   Hyperlipidemia    past hx- resloved after gastric bypass    Hypertension    Hypothyroidism    IBS (irritable bowel syndrome)    Iron deficiency anemia    Osteoporosis     Past Surgical History:  Procedure Laterality Date   ABDOMINAL HYSTERECTOMY     ABDOMINAL HYSTERECTOMY     APPENDECTOMY     BLADDER REPAIR     BREAST BIOPSY Right    needle core biopsy, benign   CATARACT EXTRACTION Left     CHOLECYSTECTOMY     COLONOSCOPY     CORONARY ANGIOPLASTY     GASTRIC BYPASS     POLYPECTOMY     REVERSE SHOULDER ARTHROPLASTY Right 07/15/2020   Procedure: REVERSE SHOULDER ARTHROPLASTY;  Surgeon: Justice Britain, MD;  Location: [facility];  Service: Orthopedics;  Laterality: Right;  15min   TONSILLECTOMY AND ADENOIDECTOMY     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   Procedure: TOTAL KNEE ARTHROPLASTY;  Surgeon: Gaynelle Arabian, MD;  Location: [facility];  Service: Orthopedics;  Laterality: Left;    Family History  Problem Relation Age of Onset   Stroke Mother    Asthma Mother    Anuerysm Mother    Obesity Father    Diabetes Sister    Heart disease Maternal Grandmother    Angina Maternal Grandmother    Suicidality Maternal Grandfather    Cancer Paternal Grandmother    Colon cancer Neg Hx    Colon polyps Neg Hx    Esophageal cancer Neg Hx    Rectal cancer Neg Hx    Stomach cancer Neg Hx    Pancreatic cancer Neg Hx    Liver disease Neg Hx     Social History   Socioeconomic History   Marital status: Single  Spouse name: Not on file   Number of children: 1   Years of education: Not on file   Highest education level: Not on file  Occupational History   Occupation: retired Product manager: RETIRED  Tobacco Use   Smoking status: Former    Packs/day: 0.30    Years: 2.00    Pack years: 0.60    Types: Cigarettes    Quit date: 07/11/1962    Years since quitting: 59.3   Smokeless tobacco: Never  Vaping Use   Vaping Use: Never used  Substance and Sexual Activity   Alcohol use: Yes   Drug use: No   Sexual activity: Yes    Birth control/protection: Surgical    Comment: Hysterectomy  Other Topics Concern   Not on file  Social History Narrative   Daily caffeine   Social Determinants of Health   Financial Resource Strain: Not on file  Food Insecurity: Not on file  Transportation Needs: Not on file  Physical Activity: Not on file   Stress: Not on file  Social Connections: Not on file  Intimate Partner Violence: Not on file    Outpatient Medications Prior to Visit  Medication Sig Dispense Refill   acetaminophen (TYLENOL) 650 MG CR tablet Take 650 mg by mouth every 8 (eight) hours as needed for pain.     albuterol (PROVENTIL HFA) 108 (90 Base) MCG/ACT inhaler Inhale 2 puffs into the lungs every 6 (six) hours as needed. (Patient taking differently: Inhale 2 puffs into the lungs every 6 (six) hours as needed for shortness of breath.) 1 each 3   aspirin EC 81 MG tablet Take 81 mg by mouth daily. Swallow whole.     denosumab (PROLIA) 60 MG/ML SOSY injection Inject 60 mg into the skin every 6 (six) months. Deliver to [address], [address], [place] [address] 1 mL 1   diclofenac Sodium (VOLTAREN) 1 % GEL Apply 2 g topically 4 (four) times daily. Do not apply to open area, apply to knee pain area     docusate sodium (COLACE) 100 MG capsule Take 100 mg by mouth 2 (two) times daily as needed for mild constipation.     ferrous sulfate 325 (65 FE) MG tablet Take 325 mg by mouth daily with breakfast.     levothyroxine (SYNTHROID) 150 MCG tablet Take 150 mcg by mouth. Once A Day on Sun, Tue, Thu, Sat     losartan (COZAAR) 50 MG tablet Take 1 tablet (50 mg total) by mouth daily. 90 tablet 1   methocarbamol (ROBAXIN) 500 MG tablet Take 1 tablet (500 mg total) by mouth every 6 (six) hours as needed for muscle spasms. 40 tablet 0   metoprolol tartrate (LOPRESSOR) 25 MG tablet Take 1 tablet (25 mg total) by mouth 2 (two) times daily. 60 tablet 0   pantoprazole (PROTONIX) 40 MG tablet Take 1 tablet (40 mg total) by mouth daily. 90 tablet 03   polyethylene glycol (MIRALAX / GLYCOLAX) 17 g packet Take 17 g by mouth daily as needed for mild constipation.     SYNTHROID 137 MCG tablet TAKE 1 TABLET ONCE DAILY ON MONDAY, WEDNESDAY, AND FRIDAY. TAKE 150MCG DAILY ALL OTHER DAYS. 90 tablet 1   venlafaxine XR (EFFEXOR-XR) 75 MG  24 hr capsule TAKE 1 CAPSULE ONCE DAILY WITH BREAKFAST. (Patient taking differently: 75 mg daily with breakfast.) 90 capsule 1   No facility-administered medications prior to visit.    Allergies  Allergen Reactions   Sulfonamide  Derivatives Diarrhea   Cocaine     Other reaction(s): Hallucination Sinus irrigation   Montelukast Sodium     UNKNOWN   Penicillins     Local skin reaction.  Tolerated ancef 07/15/2020 Tolerated Cephalosporin Date: 10/20/21.     Pioglitazone     UNKNOWN    Rosiglitazone Maleate     UNKNOWN   Secobarbital Sodium     UNKNOWN   Cefdinir Palpitations    Possible reaction    Codeine Rash   Doxycycline Nausea Only    REACTION: GI UPSET    Review of Systems  Constitutional:  Positive for malaise/fatigue. Negative for chills and fever.  HENT:  Negative for congestion and hearing loss.   Eyes:  Negative for discharge.  Respiratory:  Negative for cough, sputum production and shortness of breath.   Cardiovascular:  Negative for chest pain, palpitations and leg swelling.  Gastrointestinal:  Negative for abdominal pain, blood in stool, constipation, diarrhea, heartburn, nausea and vomiting.       (+)green bowel movements (-)black bowel movements   Genitourinary:  Negative for dysuria, frequency, hematuria and urgency.  Musculoskeletal:  Negative for back pain, falls and myalgias.  Skin:  Negative for rash.  Neurological:  Negative for dizziness, sensory change, loss of consciousness, weakness and headaches.  Endo/Heme/Allergies:  Negative for environmental allergies. Does not bruise/bleed easily.  Psychiatric/Behavioral:  Negative for depression and suicidal ideas. The patient is not nervous/anxious and does not have insomnia.       Objective:    Physical Exam Vitals and nursing note reviewed.  Constitutional:      General: She is not in acute distress.    Appearance: Normal appearance. She is not ill-appearing.  HENT:     Head:  Normocephalic and atraumatic.     Right Ear: External ear normal.     Left Ear: External ear normal.  Eyes:     Extraocular Movements: Extraocular movements intact.     Pupils: Pupils are equal, round, and reactive to light.  Cardiovascular:     Rate and Rhythm: Normal rate and regular rhythm.     Heart sounds: Normal heart sounds. No murmur heard.   No gallop.  Pulmonary:     Effort: Pulmonary effort is normal. No respiratory distress.     Breath sounds: Normal breath sounds. No wheezing or rales.  Skin:    General: Skin is warm and dry.  Neurological:     Mental Status: She is alert and oriented to person, place, and time.  Psychiatric:        Behavior: Behavior normal.        Judgment: Judgment normal.    BP 126/70 (BP Location: Left Arm, Patient Position: Sitting, Cuff Size: Normal)    Pulse 81    Temp 98.8 F (37.1 C) (Oral)    Resp 20    Ht 5\' 7"  (1.702 m)    Wt 169 lb 12.8 oz (77 kg)    SpO2 99%    BMI 26.59 kg/m  Wt Readings from Last 3 Encounters:  11/17/21 169 lb 12.8 oz (77 kg)  11/06/21 166 lb 8 oz (75.5 kg)  11/02/21 174 lb (78.9 kg)    Diabetic Foot Exam - Simple   No data filed    Lab Results  Component Value Date   WBC 6.0 11/02/2021   HGB 7.9 (A) 11/02/2021   HCT 25 (A) 11/02/2021   PLT 264 11/02/2021   GLUCOSE 141 (H) 10/31/2021   CHOL 169 08/17/2021  TRIG 127 08/17/2021   HDL 53 08/17/2021   LDLDIRECT 147.0 05/28/2019   LDLCALC 94 08/17/2021   ALT 15 10/31/2021   AST 17 10/31/2021   NA 142 11/02/2021   K 4.3 11/02/2021   CL 105 11/02/2021   CREATININE 0.6 11/02/2021   CREATININE 0.6 11/02/2021   BUN 8 11/02/2021   CO2 24 (A) 11/02/2021   TSH 2.001 10/31/2021   INR 1.9 (H) 10/28/2021   HGBA1C 5.5 08/17/2021    Lab Results  Component Value Date   TSH 2.001 10/31/2021   Lab Results  Component Value Date   WBC 6.0 11/02/2021   HGB 7.9 (A) 11/02/2021   HCT 25 (A) 11/02/2021   MCV 96.6 10/31/2021   PLT 264 11/02/2021   Lab Results   Component Value Date   NA 142 11/02/2021   K 4.3 11/02/2021   CO2 24 (A) 11/02/2021   GLUCOSE 141 (H) 10/31/2021   BUN 8 11/02/2021   CREATININE 0.6 11/02/2021   CREATININE 0.6 11/02/2021   BILITOT 1.4 (H) 10/31/2021   ALKPHOS 60 10/31/2021   AST 17 10/31/2021   ALT 15 10/31/2021   PROT 6.6 10/31/2021   ALBUMIN 3.3 (L) 10/31/2021   CALCIUM 8.4 (A) 11/02/2021   CALCIUM 8.4 (A) 11/02/2021   ANIONGAP 10 10/31/2021   GFR 84.31 04/28/2021   Lab Results  Component Value Date   CHOL 169 08/17/2021   Lab Results  Component Value Date   HDL 53 08/17/2021   Lab Results  Component Value Date   LDLCALC 94 08/17/2021   Lab Results  Component Value Date   TRIG 127 08/17/2021   Lab Results  Component Value Date   CHOLHDL 4 11/06/2020   Lab Results  Component Value Date   HGBA1C 5.5 08/17/2021       Assessment & Plan:   Problem List Items Addressed This Visit       Unprioritized   ANEMIA-IRON DEFICIENCY - Primary   Relevant Orders   CBC with Differential/Platelet   IBC panel     No orders of the defined types were placed in this encounter.   I, Dr. Roma Schanz, DO, personally preformed the services described in this documentation.  All medical record entries made by the scribe were at my direction and in my presence.  I have reviewed the chart and discharge instructions (if applicable) and agree that the record reflects my personal performance and is accurate and complete. 11/17/2021   I,Stacy Ayers,acting as a scribe for [name], DO.,have documented all relevant documentation on the behalf of [name], DO,as directed by  [name], DO while in the presence of [name], DO.   [name], DO

## 2021-11-17 NOTE — Assessment & Plan Note (Addendum)
Recheck labs today con't iron

## 2021-11-17 NOTE — Progress Notes (Signed)
° ° °  Covid-19 Vaccination Clinic  Name:  Stacy Ayers    MRN: [id number] DOB: 1941-06-01  11/17/2021  [name] was observed post Covid-19 immunization for 15 minutes without incident. She was provided with Vaccine Information Sheet and instruction to access the V-Safe system.   [name] was instructed to call 911 with any severe reactions post vaccine: Difficulty breathing  Swelling of face and throat  A fast heartbeat  A bad rash all over body  Dizziness and weakness   Immunizations Administered     Name Date Dose VIS Date Route   Moderna Covid-19 vaccine Bivalent Booster 11/17/2021 12:18 PM 0.5 mL 07/11/2021 Intramuscular   Manufacturer: Levan Hurst   Lot: [id number]   [place]: [id number]

## 2021-11-19 ENCOUNTER — Other Ambulatory Visit (HOSPITAL_BASED_OUTPATIENT_CLINIC_OR_DEPARTMENT_OTHER): Payer: Self-pay

## 2021-11-19 DIAGNOSIS — R278 Other lack of coordination: Secondary | ICD-10-CM | POA: Diagnosis not present

## 2021-11-19 DIAGNOSIS — M62562 Muscle wasting and atrophy, not elsewhere classified, left lower leg: Secondary | ICD-10-CM | POA: Diagnosis not present

## 2021-11-19 DIAGNOSIS — R2689 Other abnormalities of gait and mobility: Secondary | ICD-10-CM | POA: Diagnosis not present

## 2021-11-19 DIAGNOSIS — Z471 Aftercare following joint replacement surgery: Secondary | ICD-10-CM | POA: Diagnosis not present

## 2021-11-19 DIAGNOSIS — M1712 Unilateral primary osteoarthritis, left knee: Secondary | ICD-10-CM | POA: Diagnosis not present

## 2021-11-19 MED ORDER — MODERNA COVID-19 BIVAL BOOSTER 50 MCG/0.5ML IM SUSP
INTRAMUSCULAR | 0 refills | Status: DC
  Filled 2021-11-19: qty 0.5, 1d supply, fill #0

## 2021-11-25 DIAGNOSIS — Z471 Aftercare following joint replacement surgery: Secondary | ICD-10-CM | POA: Diagnosis not present

## 2021-11-25 DIAGNOSIS — M6388 Disorders of muscle in diseases classified elsewhere, other site: Secondary | ICD-10-CM | POA: Diagnosis not present

## 2021-11-25 DIAGNOSIS — R278 Other lack of coordination: Secondary | ICD-10-CM | POA: Diagnosis not present

## 2021-11-25 DIAGNOSIS — M1712 Unilateral primary osteoarthritis, left knee: Secondary | ICD-10-CM | POA: Diagnosis not present

## 2021-11-26 DIAGNOSIS — Z471 Aftercare following joint replacement surgery: Secondary | ICD-10-CM | POA: Diagnosis not present

## 2021-11-26 DIAGNOSIS — Z4789 Encounter for other orthopedic aftercare: Secondary | ICD-10-CM | POA: Diagnosis not present

## 2021-11-27 NOTE — Progress Notes (Deleted)
Cardiology Office Note:    Date:  11/27/2021   ID:  Stacy Ayers, DOB 1941/10/08, MRN [id number]  PCP:  Carollee Herter, Alferd Apa, DO   [facility] Providers Cardiologist:  None {   Referring MD: Carollee Herter, Alferd Apa, *     History of Present Illness:    Stacy Ayers is a 80 y.o. female with a hx of HTN, hypothyroidism, anxiety, asthma, depression, prior gastric bypass and palpitations who was referred by Dr. Carollee Herter for further evaluation of palpitations.  Today, ***  Past Medical History:  Diagnosis Date   Abnormal liver function tests    Allergy    Anxiety    Arthritis    knees, lower back    Asthma    Cancer ([place])    Cataract    left eye growing cataract    Depression    Diabetes mellitus    had gastric bypass DM resolved-    Diverticulosis    GERD (gastroesophageal reflux disease)    Headache    Previous migraines   Heart murmur    History of colon polyps    adenomatous   Hyperlipidemia    past hx- resloved after gastric bypass    Hypertension    Hypothyroidism    IBS (irritable bowel syndrome)    Iron deficiency anemia    Osteoporosis     Past Surgical History:  Procedure Laterality Date   ABDOMINAL HYSTERECTOMY     ABDOMINAL HYSTERECTOMY     APPENDECTOMY     BLADDER REPAIR     BREAST BIOPSY Right    needle core biopsy, benign   CATARACT EXTRACTION Left    CHOLECYSTECTOMY     COLONOSCOPY     CORONARY ANGIOPLASTY     GASTRIC BYPASS     POLYPECTOMY     REVERSE SHOULDER ARTHROPLASTY Right 07/15/2020   Procedure: REVERSE SHOULDER ARTHROPLASTY;  Surgeon: Justice Britain, MD;  Location: [facility];  Service: Orthopedics;  Laterality: Right;  131min   TONSILLECTOMY AND ADENOIDECTOMY     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   Procedure: TOTAL KNEE ARTHROPLASTY;  Surgeon: Gaynelle Arabian, MD;  Location: [facility];  Service: Orthopedics;  Laterality: Left;    Current Medications: No outpatient medications have been marked as taking for the 12/01/21  encounter (Appointment) with Freada Bergeron, MD.     Allergies:   Sulfonamide derivatives, Cocaine, Montelukast sodium, Penicillins, Pioglitazone, Rosiglitazone maleate, Secobarbital sodium, Cefdinir, Codeine, and Doxycycline   Social History   Socioeconomic History   Marital status: Single    Spouse name: Not on file   Number of children: 1   Years of education: Not on file   Highest education level: Not on file  Occupational History   Occupation: retired Product manager: RETIRED  Tobacco Use   Smoking status: Former    Packs/day: 0.30    Years: 2.00    Pack years: 0.60    Types: Cigarettes    Quit date: 07/11/1962    Years since quitting: 59.4   Smokeless tobacco: Never  Vaping Use   Vaping Use: Never used  Substance and Sexual Activity   Alcohol use: Yes   Drug use: No   Sexual activity: Yes    Birth control/protection: Surgical    Comment: Hysterectomy  Other Topics Concern   Not on file  Social History Narrative   Daily caffeine   Social Determinants of Health   Financial Resource Strain: Not on file  Food  Insecurity: Not on file  Transportation Needs: Not on file  Physical Activity: Not on file  Stress: Not on file  Social Connections: Not on file     Family History: The patient's ***family history includes Angina in her maternal grandmother; Anuerysm in her mother; Asthma in her mother; Cancer in her paternal grandmother; Diabetes in her sister; Heart disease in her maternal grandmother; Obesity in her father; Stroke in her mother; Suicidality in her maternal grandfather. There is no history of Colon cancer, Colon polyps, Esophageal cancer, Rectal cancer, Stomach cancer, Pancreatic cancer, or Liver disease.  ROS:   Please see the history of present illness.    *** All other systems reviewed and are negative.  EKGs/Labs/Other Studies Reviewed:    The following studies were reviewed today: CTA chest 10/28/20: FINDINGS: CTA CHEST FINDINGS    Cardiovascular: Scattered aortic atherosclerosis. Normal caliber of the thoracic aorta. Normal caliber of the main pulmonary arteries. Main pulmonary artery is moderately well opacified at 245 Hounsfield units. There is some motion at the lung bases which does limit assessment. No signs of pulmonary embolism to the segmental level.   Mediastinum/Nodes: No thoracic inlet, axillary, mediastinal or hilar adenopathy. Esophagus grossly normal.   Lungs/Pleura: Basilar atelectasis. No effusion. No consolidative process.   Musculoskeletal: No acute bone finding. No destructive bone process. Spinal degenerative changes. See below for full musculoskeletal detail about the abdomen and pelvis.   Review of the MIP images confirms the above findings.   CT ABDOMEN and PELVIS FINDINGS   Hepatobiliary: Liver contour is lobular with fissural widening. Post cholecystectomy. No biliary duct distension. Portal vein is patent. No focal, suspicious hepatic lesion.   Pancreas: Normal contour without signs of inflammation.   Spleen: Top normal size, no visible lesion.   Adrenals/Urinary Tract: Adrenal glands are normal.   Symmetric renal enhancement without hydronephrosis. No nephrolithiasis. Small amount of gas in the urinary bladder. Small cysts in the bilateral kidneys. 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   Stomach/Bowel: Signs of gastric bypass procedure. Ingested tablet seen in the dependent portion of the gastric pouch does not changed density from arterial to venous phase or change in size. Small hiatal hernia.   Jejunojejunostomy in the LEFT upper quadrant. No dilation of the E Ferrante limb or substantial dilation of the excluded stomach. No acute gastrointestinal process. Appendix not visualized though there are no secondary signs to suggest acute appendicitis.   Vascular/Lymphatic:   Aortic atherosclerosis. No sign of aneurysm. Smooth contour of the IVC. There is no  gastrohepatic or hepatoduodenal ligament lymphadenopathy. No retroperitoneal or mesenteric lymphadenopathy.   No pelvic sidewall lymphadenopathy.   Atherosclerotic changes are mild.   Reproductive: Unremarkable.   Other: Small midline ventral hernia above the umbilicus contains fat only. No ascites. No free air.   Musculoskeletal: No acute bone finding. No destructive bone process. Spinal degenerative changes. Degenerative changes are moderate to marked and worse towards the lower lumbar spine.   Review of the MIP images confirms the above findings.   IMPRESSION: Negative for pulmonary embolism to the segmental level. Mildly compromised bolus timing and respiratory motion with some limitation.   Small amount of gas in the urinary bladder. Correlate with recent instrumentation.   Signs of gastric bypass procedure without signs of obstruction or acute bowel process.   Small hiatal hernia.   Small midline ventral hernia above the umbilicus contains only fat.   Signs of potential liver disease with lobular hepatic contours.   1 cm  RIGHT renal AML.   Aortic Atherosclerosis (ICD10-I70.0).  EKG:  EKG is *** ordered today.  The ekg ordered today demonstrates ***  Recent Labs: 10/31/2021: ALT 15; TSH 2.001 11/02/2021: BUN 8; Creatinine 0.6; Creatinine 0.6; Magnesium 2.4; Potassium 4.3; Sodium 142 11/17/2021: Hemoglobin 10.0; Platelets 231.0  Recent Lipid Panel    Component Value Date/Time   CHOL 169 08/17/2021 0000   TRIG 127 08/17/2021 0000   HDL 53 08/17/2021 0000   CHOLHDL 4 11/06/2020 1011   VLDL 27.8 11/06/2020 1011   LDLCALC 94 08/17/2021 0000   LDLDIRECT 147.0 05/28/2019 1522     Risk Assessment/Calculations:   {Does this patient have ATRIAL FIBRILLATION?:[phone]}       Physical Exam:    VS:  There were no vitals taken for this visit.    Wt Readings from Last 3 Encounters:  11/17/21 169 lb 12.8 oz (77 kg)  11/06/21 166 lb 8 oz (75.5 kg)  11/02/21  174 lb (78.9 kg)     GEN: *** Well nourished, well developed in no acute distress HEENT: Normal NECK: No JVD; No carotid bruits LYMPHATICS: No lymphadenopathy CARDIAC: ***RRR, no murmurs, rubs, gallops RESPIRATORY:  Clear to auscultation without rales, wheezing or rhonchi  ABDOMEN: Soft, non-tender, non-distended MUSCULOSKELETAL:  No edema; No deformity  SKIN: Warm and dry NEUROLOGIC:  Alert and oriented x 3 PSYCHIATRIC:  Normal affect   ASSESSMENT:    No diagnosis found. PLAN:    In order of problems listed above:  #Palpitations: Zio with ****  #Aortic atherosclerosis: -Start crestor 10mg  daily  #HTN: Controlled. -Continue losartan 50mg  daily -Change metop tartrate to metop succinate 25mg  XL daily      {Are you ordering a CV Procedure (e.g. stress test, cath, DCCV, TEE, etc)?   Press F2        :[id number]}    Medication Adjustments/Labs and Tests Ordered: Current medicines are reviewed at length with the patient today.  Concerns regarding medicines are outlined above.  No orders of the defined types were placed in this encounter.  No orders of the defined types were placed in this encounter.   There are no Patient Instructions on file for this visit.   Signed, Freada Bergeron, MD  11/27/2021 4:13 PM    [facility]

## 2021-12-01 ENCOUNTER — Ambulatory Visit: Payer: Medicare PPO | Admitting: Cardiology

## 2021-12-01 ENCOUNTER — Encounter: Payer: Self-pay | Admitting: Cardiology

## 2021-12-01 ENCOUNTER — Other Ambulatory Visit: Payer: Self-pay

## 2021-12-01 VITALS — BP 136/80 | HR 72 | Ht 67.0 in | Wt 165.6 lb

## 2021-12-01 DIAGNOSIS — R002 Palpitations: Secondary | ICD-10-CM

## 2021-12-01 DIAGNOSIS — I10 Essential (primary) hypertension: Secondary | ICD-10-CM | POA: Diagnosis not present

## 2021-12-01 DIAGNOSIS — I471 Supraventricular tachycardia: Secondary | ICD-10-CM

## 2021-12-01 DIAGNOSIS — I700 Atherosclerosis of aorta: Secondary | ICD-10-CM

## 2021-12-01 MED ORDER — METOPROLOL SUCCINATE ER 50 MG PO TB24: 50.0000 mg | ORAL_TABLET | Freq: Every day | ORAL | 3 refills | Status: DC

## 2021-12-01 MED ORDER — METOPROLOL TARTRATE 25 MG PO TABS: 25.0000 mg | ORAL_TABLET | Freq: Three times a day (TID) | ORAL | 3 refills | Status: AC | PRN

## 2021-12-01 NOTE — Patient Instructions (Signed)
Medication Instructions:   START TAKING YOUR METOPROLOL TARTRATE (LOPRESSOR)  25 MG BY MOUTH THREE TIMES DAILY ONLY AS NEEDED FOR PALPITATIONS  START TAKING METOPROLOL SUCCINATE (TOPROL XL) 50 MG BY MOUTH DAILY  *If you need a refill on your cardiac medications before your next appointment, please call your pharmacy*   Testing/Procedures:  Your physician has requested that you have an echocardiogram. Echocardiography is a painless test that uses sound waves to create images of your heart. It provides your doctor with information about the size and shape of your heart and how well your hearts chambers and valves are working. This procedure takes approximately one hour. There are no restrictions for this procedure.   Follow-Up:  3 MONTHS WITH DR. Johney Frame OR AN EXTENDER

## 2021-12-01 NOTE — Progress Notes (Signed)
Cardiology Office Note:    Date:  12/01/2021   ID:  Stacy Ayers, DOB 1941/02/08, MRN [id number]  PCP:  Carollee Herter, Alferd Apa, DO   [facility] Providers Cardiologist:  None {  Referring MD: Carollee Herter, Alferd Apa, *   History of Present Illness:    Stacy Ayers is a 81 y.o. female with a hx of HTN, hypothyroidism, anxiety, asthma, depression, prior gastric bypass and palpitations who was referred by Dr. Carollee Herter for further evaluation of palpitations.  The patient states that she has a history of palpitations that began when she was in college. They were not overly bothersome and she was not managed with medications at that time. Following her left total knee arthroplasty on 10/19/21, her palpitations became much more frequent and bothersome leading to daily symptoms. Denies any associated lightheadedness, dizziness, chest pain or SOB. Episodes could last a couple of seconds to up to an hour. 2 week zio placed which revealed 1200 episodes of SVT with longest episode lasting 16min at HR 124. She has been on metoprolol 25mg  BID which has helped some but she continues to deal with symptoms.   She denies any chest pain, or shortness of breath. No lightheadedness, headaches, orthopnea, PND, lower extremity edema or exertional symptoms. She has no known cardiac history. Family history only notable for a grandmother with angina.   Past Medical History:  Diagnosis Date   Abnormal liver function tests    Allergy    Anxiety    Arthritis    knees, lower back    Asthma    Cancer ([place])    Cataract    left eye growing cataract    Depression    Diabetes mellitus    had gastric bypass DM resolved-    Diverticulosis    GERD (gastroesophageal reflux disease)    Headache    Previous migraines   Heart murmur    History of colon polyps    adenomatous   Hyperlipidemia    past hx- resloved after gastric bypass    Hypertension    Hypothyroidism    IBS (irritable bowel syndrome)    Iron  deficiency anemia    Osteoporosis     Past Surgical History:  Procedure Laterality Date   ABDOMINAL HYSTERECTOMY     ABDOMINAL HYSTERECTOMY     APPENDECTOMY     BLADDER REPAIR     BREAST BIOPSY Right    needle core biopsy, benign   CATARACT EXTRACTION Left    CHOLECYSTECTOMY     COLONOSCOPY     CORONARY ANGIOPLASTY     GASTRIC BYPASS     POLYPECTOMY     REVERSE SHOULDER ARTHROPLASTY Right 07/15/2020   Procedure: REVERSE SHOULDER ARTHROPLASTY;  Surgeon: Justice Britain, MD;  Location: [facility];  Service: Orthopedics;  Laterality: Right;  161min   TONSILLECTOMY AND ADENOIDECTOMY     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   Procedure: TOTAL KNEE ARTHROPLASTY;  Surgeon: Gaynelle Arabian, MD;  Location: [facility];  Service: Orthopedics;  Laterality: Left;    Current Medications: Current Meds  Medication Sig   acetaminophen (TYLENOL) 650 MG CR tablet Take 650 mg by mouth every 8 (eight) hours as needed for pain.   albuterol (PROVENTIL HFA) 108 (90 Base) MCG/ACT inhaler Inhale 2 puffs into the lungs every 6 (six) hours as needed.   aspirin EC 81 MG tablet Take 81 mg by mouth daily. Swallow whole.   COVID-19 mRNA bivalent vaccine, Moderna, (MODERNA COVID-19 [place])  50 MCG/0.5ML injection Inject into the muscle.   denosumab (PROLIA) 60 MG/ML SOSY injection Inject 60 mg into the skin every 6 (six) months. Deliver to [address], [place] [address]   diclofenac Sodium (VOLTAREN) 1 % GEL Apply 2 g topically 4 (four) times daily. Do not apply to open area, apply to knee pain area   ferrous sulfate 325 (65 FE) MG tablet Take 325 mg by mouth daily with breakfast.   levothyroxine (SYNTHROID) 150 MCG tablet Take 150 mcg by mouth. Once A Day on Sun, Tue, Thu, Sat   losartan (COZAAR) 50 MG tablet Take 1 tablet (50 mg total) by mouth daily.   methocarbamol (ROBAXIN) 500 MG tablet Take 1 tablet (500 mg total) by mouth every 6 (six) hours as needed for muscle spasms.   metoprolol  succinate (TOPROL-XL) 50 MG 24 hr tablet Take 1 tablet (50 mg total) by mouth daily. Take with or immediately following a meal.   metoprolol tartrate (LOPRESSOR) 25 MG tablet Take 1 tablet (25 mg total) by mouth 3 (three) times daily as needed (palpitations).   pantoprazole (PROTONIX) 40 MG tablet Take 1 tablet (40 mg total) by mouth daily.   SYNTHROID 137 MCG tablet TAKE 1 TABLET ONCE DAILY ON MONDAY, WEDNESDAY, AND FRIDAY. TAKE 150MCG DAILY ALL OTHER DAYS.   venlafaxine XR (EFFEXOR-XR) 75 MG 24 hr capsule TAKE 1 CAPSULE ONCE DAILY WITH BREAKFAST.     Allergies:   Sulfonamide derivatives, Cocaine, Montelukast sodium, Penicillins, Pioglitazone, Rosiglitazone maleate, Secobarbital sodium, Cefdinir, Codeine, and Doxycycline   Social History   Socioeconomic History   Marital status: Single    Spouse name: Not on file   Number of children: 1   Years of education: Not on file   Highest education level: Not on file  Occupational History   Occupation: retired Product manager: RETIRED  Tobacco Use   Smoking status: Former    Packs/day: 0.30    Years: 2.00    Pack years: 0.60    Types: Cigarettes    Quit date: 07/11/1962    Years since quitting: 59.4   Smokeless tobacco: Never  Vaping Use   Vaping Use: Never used  Substance and Sexual Activity   Alcohol use: Yes   Drug use: No   Sexual activity: Yes    Birth control/protection: Surgical    Comment: Hysterectomy  Other Topics Concern   Not on file  Social History Narrative   Daily caffeine   Social Determinants of Health   Financial Resource Strain: Not on file  Food Insecurity: Not on file  Transportation Needs: Not on file  Physical Activity: Not on file  Stress: Not on file  Social Connections: Not on file     Family History: The patient's family history includes Angina in her maternal grandmother; Anuerysm in her mother; Asthma in her mother; Cancer in her paternal grandmother; Diabetes in her sister; Heart disease  in her maternal grandmother; Obesity in her father; Stroke in her mother; Suicidality in her maternal grandfather. There is no history of Colon cancer, Colon polyps, Esophageal cancer, Rectal cancer, Stomach cancer, Pancreatic cancer, or Liver disease.  ROS:   Review of Systems  Constitutional:  Negative for diaphoresis and malaise/fatigue.  HENT:  Negative for ear pain and sore throat.   Eyes:  Negative for photophobia and discharge.  Respiratory:  Negative for cough, hemoptysis and sputum production.   Cardiovascular:  Positive for palpitations. Negative for chest pain, orthopnea, claudication, leg  swelling and PND.  Gastrointestinal:  Negative for blood in stool and constipation.  Genitourinary:  Negative for dysuria and hematuria.  Musculoskeletal:  Negative for falls and joint pain.  Skin:  Negative for itching.  Neurological:  Negative for dizziness, tingling and headaches.  Endo/Heme/Allergies:  Negative for environmental allergies.  Psychiatric/Behavioral:  Negative for depression and hallucinations. The patient does not have insomnia.     EKGs/Labs/Other Studies Reviewed:    The following studies were reviewed today: Cardiac Monitor 11/27/21: Patch Wear Time:  13 days and 16 hours (2022-12-09T19:31:54-0500 to 2022-12-23T11:38:56-0500)   Patient had a min HR of 51 bpm, max HR of 218 bpm, and avg HR of 72 bpm. Predominant underlying rhythm was Sinus Rhythm. 1201 Supraventricular Tachycardia runs occurred, the run with the fastest interval lasting 10 beats with a max rate of 218 bpm, the  longest lasting 45 mins 14 secs with an avg rate of 124 bpm. Isolated SVEs were rare (<1.0%), SVE Couplets were rare (<1.0%), and SVE Triplets were rare (<1.0%). Isolated VEs were rare (<1.0%), VE Couplets were rare (<1.0%), and no VE Triplets were  present.   LE Venous DVT 10/28/2021: Summary:  RIGHT:  - No evidence of common femoral vein obstruction.     LEFT:  - There is no evidence of  deep vein thrombosis in the lower extremity.  - There is no evidence of superficial venous thrombosis.     - No cystic structure found in the popliteal fossa.  Subcutaneous irregularity noted by the sonographer in area of knee and  calf.   CTA chest 10/28/21: FINDINGS: CTA CHEST FINDINGS   Cardiovascular: Scattered aortic atherosclerosis. Normal caliber of the thoracic aorta. Normal caliber of the main pulmonary arteries. Main pulmonary artery is moderately well opacified at 245 Hounsfield units. There is some motion at the lung bases which does limit assessment. No signs of pulmonary embolism to the segmental level.   Mediastinum/Nodes: No thoracic inlet, axillary, mediastinal or hilar adenopathy. Esophagus grossly normal.   Lungs/Pleura: Basilar atelectasis. No effusion. No consolidative process.   Musculoskeletal: No acute bone finding. No destructive bone process. Spinal degenerative changes. See below for full musculoskeletal detail about the abdomen and pelvis.   Review of the MIP images confirms the above findings.   CT ABDOMEN and PELVIS FINDINGS   Hepatobiliary: Liver contour is lobular with fissural widening. Post cholecystectomy. No biliary duct distension. Portal vein is patent. No focal, suspicious hepatic lesion.   Pancreas: Normal contour without signs of inflammation.   Spleen: Top normal size, no visible lesion.   Adrenals/Urinary Tract: Adrenal glands are normal.   Symmetric renal enhancement without hydronephrosis. No nephrolithiasis. Small amount of gas in the urinary bladder. Small cysts in the bilateral kidneys. 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   Stomach/Bowel: Signs of gastric bypass procedure. Ingested tablet seen in the dependent portion of the gastric pouch does not changed density from arterial to venous phase or change in size. Small hiatal hernia.   Jejunojejunostomy in the LEFT upper quadrant. No dilation of the  E Ferrante limb or substantial dilation of the excluded stomach. No acute gastrointestinal process. Appendix not visualized though there are no secondary signs to suggest acute appendicitis.   Vascular/Lymphatic:   Aortic atherosclerosis. No sign of aneurysm. Smooth contour of the IVC. There is no gastrohepatic or hepatoduodenal ligament lymphadenopathy. No retroperitoneal or mesenteric lymphadenopathy.   No pelvic sidewall lymphadenopathy.   Atherosclerotic changes are mild.   Reproductive: Unremarkable.   Other:  Small midline ventral hernia above the umbilicus contains fat only. No ascites. No free air.   Musculoskeletal: No acute bone finding. No destructive bone process. Spinal degenerative changes. Degenerative changes are moderate to marked and worse towards the lower lumbar spine.   Review of the MIP images confirms the above findings.   IMPRESSION: Negative for pulmonary embolism to the segmental level. Mildly compromised bolus timing and respiratory motion with some limitation.   Small amount of gas in the urinary bladder. Correlate with recent instrumentation.   Signs of gastric bypass procedure without signs of obstruction or acute bowel process.   Small hiatal hernia.   Small midline ventral hernia above the umbilicus contains only fat.   Signs of potential liver disease with lobular hepatic contours.   1 cm RIGHT renal AML.   Aortic Atherosclerosis (ICD10-I70.0).  EKG:   12/01/2021: Sinus rhythm. Rate 72 bpm.   Recent Labs: 10/31/2021: ALT 15; TSH 2.001 11/02/2021: BUN 8; Creatinine 0.6; Creatinine 0.6; Magnesium 2.4; Potassium 4.3; Sodium 142 11/17/2021: Hemoglobin 10.0; Platelets 231.0   Recent Lipid Panel    Component Value Date/Time   CHOL 169 08/17/2021 0000   TRIG 127 08/17/2021 0000   HDL 53 08/17/2021 0000   CHOLHDL 4 11/06/2020 1011   VLDL 27.8 11/06/2020 1011   LDLCALC 94 08/17/2021 0000   LDLDIRECT 147.0 05/28/2019 1522     Risk  Assessment/Calculations:           Physical Exam:    VS:  BP 136/80    Pulse 72    Ht 5\' 7"  (1.702 m)    Wt 165 lb 9.6 oz (75.1 kg)    SpO2 96%    BMI 25.94 kg/m     Wt Readings from Last 3 Encounters:  12/01/21 165 lb 9.6 oz (75.1 kg)  11/17/21 169 lb 12.8 oz (77 kg)  11/06/21 166 lb 8 oz (75.5 kg)     GEN: Well nourished, well developed in no acute distress HEENT: Normal NECK: No JVD; No carotid bruits CARDIAC: RRR, no murmurs, rubs, gallops RESPIRATORY:  Clear to auscultation without rales, wheezing or rhonchi  ABDOMEN: Soft, non-tender, non-distended MUSCULOSKELETAL:  No edema; No deformity  SKIN: Warm and dry NEUROLOGIC:  Alert and oriented x 3 PSYCHIATRIC:  Normal affect   ASSESSMENT:    1. Palpitations   2. SVT (supraventricular tachycardia) ([place])   3. Primary hypertension   4. Aortic atherosclerosis ([facility])    PLAN:    In order of problems listed above:  #Palpitations: #SVT: Zio monitor with 1200 episodes of SVT with longest lasting 58min at HR 124. Continues to have frequent palpitations despite starting metop 25mg  BID. Denies associated symptoms of lightheadedness, dizziness, chest pain, SOB or syncope. Will start metop XL with tartrate prn for breakthrough. If continued symptoms, will refer to EP for consideration of ablation. -Start metop 50mg  XL daily -Change metoprolol tartrate to 25mg  TID prn for breakthrough -Check TTE -Discussed vagal maneuvers -Hydration and PO mag -Cut back on caffeine -If fails to improve with above measures, will refer to EP for consideration of ablation  #HTN: Slightly elevated today and not monitored at home. Will increase BB as above given palpitations. -Continue losartan 50mg  daily -Change metop tartrate to metop succinate 50mg  XL daily  #Aortic Atherosclerosis: LDL 94. No known personal or significant family history of CAD. -Continue lifestyle modifications  -Will discuss statin therapy at next visit     Follow-up: 3  months.   Medication Adjustments/Labs and Tests Ordered:  Current medicines are reviewed at length with the patient today.  Concerns regarding medicines are outlined above.  Orders Placed This Encounter  Procedures   EKG 12-Lead   ECHOCARDIOGRAM COMPLETE   Meds ordered this encounter  Medications   metoprolol tartrate (LOPRESSOR) 25 MG tablet    Sig: Take 1 tablet (25 mg total) by mouth 3 (three) times daily as needed (palpitations).    Dispense:  90 tablet    Refill:  3    Dose change- changed this to PRN   metoprolol succinate (TOPROL-XL) 50 MG 24 hr tablet    Sig: Take 1 tablet (50 mg total) by mouth daily. Take with or immediately following a meal.    Dispense:  90 tablet    Refill:  3    This is scheduled med.  Tartrate is PRN.    Patient Instructions  Medication Instructions:   START TAKING YOUR METOPROLOL TARTRATE (LOPRESSOR)  25 MG BY MOUTH THREE TIMES DAILY ONLY AS NEEDED FOR PALPITATIONS  START TAKING METOPROLOL SUCCINATE (TOPROL XL) 50 MG BY MOUTH DAILY  *If you need a refill on your cardiac medications before your next appointment, please call your pharmacy*   Testing/Procedures:  Your physician has requested that you have an echocardiogram. Echocardiography is a painless test that uses sound waves to create images of your heart. It provides your doctor with information about the size and shape of your heart and how well your hearts chambers and valves are working. This procedure takes approximately one hour. There are no restrictions for this procedure.   Follow-Up:  3 MONTHS WITH DR. Johney Frame OR AN EXTENDER      I,Mathew Stumpf,acting as a scribe for Freada Bergeron, MD.,have documented all relevant documentation on the behalf of Freada Bergeron, MD,as directed by  Freada Bergeron, MD while in the presence of Freada Bergeron, MD.  I, Freada Bergeron, MD, have reviewed all documentation for this visit. The documentation on 12/01/21 for  the exam, diagnosis, procedures, and orders are all accurate and complete.   Signed, Freada Bergeron, MD  12/01/2021 8:48 AM    [facility]

## 2021-12-03 DIAGNOSIS — M62562 Muscle wasting and atrophy, not elsewhere classified, left lower leg: Secondary | ICD-10-CM | POA: Diagnosis not present

## 2021-12-03 DIAGNOSIS — R2689 Other abnormalities of gait and mobility: Secondary | ICD-10-CM | POA: Diagnosis not present

## 2021-12-03 DIAGNOSIS — M1712 Unilateral primary osteoarthritis, left knee: Secondary | ICD-10-CM | POA: Diagnosis not present

## 2021-12-03 DIAGNOSIS — R278 Other lack of coordination: Secondary | ICD-10-CM | POA: Diagnosis not present

## 2021-12-03 DIAGNOSIS — Z471 Aftercare following joint replacement surgery: Secondary | ICD-10-CM | POA: Diagnosis not present

## 2021-12-03 DIAGNOSIS — M6388 Disorders of muscle in diseases classified elsewhere, other site: Secondary | ICD-10-CM | POA: Diagnosis not present

## 2021-12-05 ENCOUNTER — Other Ambulatory Visit: Payer: Self-pay | Admitting: Family Medicine

## 2021-12-07 DIAGNOSIS — Z471 Aftercare following joint replacement surgery: Secondary | ICD-10-CM | POA: Diagnosis not present

## 2021-12-07 DIAGNOSIS — M62562 Muscle wasting and atrophy, not elsewhere classified, left lower leg: Secondary | ICD-10-CM | POA: Diagnosis not present

## 2021-12-07 DIAGNOSIS — R2689 Other abnormalities of gait and mobility: Secondary | ICD-10-CM | POA: Diagnosis not present

## 2021-12-07 DIAGNOSIS — R278 Other lack of coordination: Secondary | ICD-10-CM | POA: Diagnosis not present

## 2021-12-07 DIAGNOSIS — M1712 Unilateral primary osteoarthritis, left knee: Secondary | ICD-10-CM | POA: Diagnosis not present

## 2021-12-07 NOTE — Telephone Encounter (Signed)
Patient has 153mcg and 150mg .   Which do you want her on?

## 2021-12-08 NOTE — Telephone Encounter (Signed)
Patient stated she was on both.  She takes the 186mcg on Sun, Tues, Thurs, Sat and the 134mcg on the other days.

## 2021-12-09 DIAGNOSIS — Z471 Aftercare following joint replacement surgery: Secondary | ICD-10-CM | POA: Diagnosis not present

## 2021-12-09 DIAGNOSIS — M62562 Muscle wasting and atrophy, not elsewhere classified, left lower leg: Secondary | ICD-10-CM | POA: Diagnosis not present

## 2021-12-09 DIAGNOSIS — R2689 Other abnormalities of gait and mobility: Secondary | ICD-10-CM | POA: Diagnosis not present

## 2021-12-09 DIAGNOSIS — R278 Other lack of coordination: Secondary | ICD-10-CM | POA: Diagnosis not present

## 2021-12-09 DIAGNOSIS — M1712 Unilateral primary osteoarthritis, left knee: Secondary | ICD-10-CM | POA: Diagnosis not present

## 2021-12-14 DIAGNOSIS — Z471 Aftercare following joint replacement surgery: Secondary | ICD-10-CM | POA: Diagnosis not present

## 2021-12-14 DIAGNOSIS — R2689 Other abnormalities of gait and mobility: Secondary | ICD-10-CM | POA: Diagnosis not present

## 2021-12-14 DIAGNOSIS — R278 Other lack of coordination: Secondary | ICD-10-CM | POA: Diagnosis not present

## 2021-12-14 DIAGNOSIS — M62562 Muscle wasting and atrophy, not elsewhere classified, left lower leg: Secondary | ICD-10-CM | POA: Diagnosis not present

## 2021-12-14 DIAGNOSIS — M1712 Unilateral primary osteoarthritis, left knee: Secondary | ICD-10-CM | POA: Diagnosis not present

## 2021-12-15 ENCOUNTER — Telehealth: Payer: Self-pay | Admitting: Cardiology

## 2021-12-15 ENCOUNTER — Other Ambulatory Visit: Payer: Self-pay

## 2021-12-15 ENCOUNTER — Ambulatory Visit (HOSPITAL_COMMUNITY): Payer: Medicare PPO | Attending: Cardiology

## 2021-12-15 DIAGNOSIS — R002 Palpitations: Secondary | ICD-10-CM | POA: Insufficient documentation

## 2021-12-15 DIAGNOSIS — I471 Supraventricular tachycardia: Secondary | ICD-10-CM

## 2021-12-15 LAB — ECHOCARDIOGRAM COMPLETE
Area-P 1/2: 3.03 cm2
S' Lateral: 2.7 cm

## 2021-12-15 MED ORDER — METOPROLOL SUCCINATE ER 50 MG PO TB24: ORAL_TABLET | ORAL | 1 refills | Status: DC

## 2021-12-15 NOTE — Telephone Encounter (Signed)
Spoke with the pt and informed her that per Dr. Johney Frame, we sent in increased dose of Toprol XL for her to take to her pharmacy. Informed the pt that Dr. Johney Frame wants her to increase her Toprol XL to taking 50 mg po in the AM then take 25 mg po in the PM everyday and continue using her PRN metoprolol tartrate as directed for breakthrough palpitations.  Informed the pt that if she is still having a lot of breakthrough palpitations with this increased dose, then she should call the office and let us know and Dr. Johney Frame said she can always up-titrate the long-acting metoprolol again.  Advised her to follow-up with Dr. Quentin Ore in EP for her consult on 2/14. She is aware that script was already sent to her pharmacy on file. Pt verbalized understanding and agrees with this plan.  Pt was more than gracious for all the assistance provided.

## 2021-12-15 NOTE — Telephone Encounter (Signed)
Pt is calling to inform Dr. Johney Frame that she is still experiencing palpitations, especially at night. Pt saw Dr. Johney Frame on 1/3 where she was started on Toprol XL 50 mg po daily and was given tartrate 25 mg po TID as needed for breakthrough palpitations.  Pt states she has been resorting the use of her PRN metoprolol tartrate and some days has to take it up to 3 times daily PRN, along with her daily Toprol XL. Pt has history of SVT which was noted on recent zio.  She states she has a history of anxiety.  Pt will see Dr. Quentin Ore for EP consult on 2/14 for consideration of SVT ablation. Pt is not monitoring her BP/HRs at home, which I advised her to start doing and periodically recording this.  She denies any other cardiac complaints like chest pain, sob, dizziness, orthopnea, exertional symptoms, pre-syncopal or syncopal episodes.  Pt is inquiring if Dr. Johney Frame should adjust her med regimen or change a med, until she can be seen and evaluated by Dr. Quentin Ore EP on 2/14. Advised the pt to start monitoring and logging her pressures and rates. Advised her to avoid any caffeine, decaff, chocolate, or any stimulants at this time. Advised her to make sure she is reducing high amounts of salt and fatty foods in her diet and stay plenty hydrated with water.  Advised the pt to start using OTC magnesium at night to assist with palpitations she is experiencing at that time. Also advised her to utilize stress reduction techniques.  Also discussed vagal maneuvers with the pt.  Informed the pt that I will route this communication to Dr. Johney Frame to further review and advise, and I will follow-up with the pt accordingly thereafter.  Pt verbalized understanding and agrees with this plan.

## 2021-12-15 NOTE — Telephone Encounter (Signed)
For sure! Let's do metoprolol 50mg  XL in the morning and 25mg  XL to take at night with the continued use of the prn tartrate to use as needed. If still having a lot of breakthrough, will up-titrate the long acting metoprolol again.

## 2021-12-15 NOTE — Telephone Encounter (Signed)
Pt c/o medication issue:  1. Name of Medication: Metoprolol  2. How are you currently taking this medication (dosage and times per day)? 1 time a day unless she needs it more for palpitations  3. Are you having a reaction (difficulty breathing--STAT)?   4. What is your medication issue? Patient says it does not seen to be helping, even when she take more than 1 tablet a day

## 2021-12-15 NOTE — Telephone Encounter (Signed)
Left the pt a message to call the office back to discuss recommendations per Dr. Johney Frame.  Did go ahead and sent in increased dose of Toprol XL to the pts pharmacy on file.

## 2021-12-16 DIAGNOSIS — M62562 Muscle wasting and atrophy, not elsewhere classified, left lower leg: Secondary | ICD-10-CM | POA: Diagnosis not present

## 2021-12-16 DIAGNOSIS — R278 Other lack of coordination: Secondary | ICD-10-CM | POA: Diagnosis not present

## 2021-12-16 DIAGNOSIS — M1712 Unilateral primary osteoarthritis, left knee: Secondary | ICD-10-CM | POA: Diagnosis not present

## 2021-12-16 DIAGNOSIS — Z471 Aftercare following joint replacement surgery: Secondary | ICD-10-CM | POA: Diagnosis not present

## 2021-12-16 DIAGNOSIS — R2689 Other abnormalities of gait and mobility: Secondary | ICD-10-CM | POA: Diagnosis not present

## 2021-12-21 DIAGNOSIS — M62562 Muscle wasting and atrophy, not elsewhere classified, left lower leg: Secondary | ICD-10-CM | POA: Diagnosis not present

## 2021-12-21 DIAGNOSIS — R2689 Other abnormalities of gait and mobility: Secondary | ICD-10-CM | POA: Diagnosis not present

## 2021-12-21 DIAGNOSIS — Z471 Aftercare following joint replacement surgery: Secondary | ICD-10-CM | POA: Diagnosis not present

## 2021-12-21 DIAGNOSIS — R278 Other lack of coordination: Secondary | ICD-10-CM | POA: Diagnosis not present

## 2021-12-21 DIAGNOSIS — M1712 Unilateral primary osteoarthritis, left knee: Secondary | ICD-10-CM | POA: Diagnosis not present

## 2021-12-28 DIAGNOSIS — Z471 Aftercare following joint replacement surgery: Secondary | ICD-10-CM | POA: Diagnosis not present

## 2021-12-28 DIAGNOSIS — R2689 Other abnormalities of gait and mobility: Secondary | ICD-10-CM | POA: Diagnosis not present

## 2021-12-28 DIAGNOSIS — R278 Other lack of coordination: Secondary | ICD-10-CM | POA: Diagnosis not present

## 2021-12-28 DIAGNOSIS — M62562 Muscle wasting and atrophy, not elsewhere classified, left lower leg: Secondary | ICD-10-CM | POA: Diagnosis not present

## 2021-12-28 DIAGNOSIS — M1712 Unilateral primary osteoarthritis, left knee: Secondary | ICD-10-CM | POA: Diagnosis not present

## 2022-01-04 ENCOUNTER — Other Ambulatory Visit (HOSPITAL_COMMUNITY): Payer: Self-pay

## 2022-01-04 ENCOUNTER — Telehealth: Payer: Self-pay | Admitting: Pharmacist

## 2022-01-04 DIAGNOSIS — M1712 Unilateral primary osteoarthritis, left knee: Secondary | ICD-10-CM | POA: Diagnosis not present

## 2022-01-04 DIAGNOSIS — R278 Other lack of coordination: Secondary | ICD-10-CM | POA: Diagnosis not present

## 2022-01-04 DIAGNOSIS — Z471 Aftercare following joint replacement surgery: Secondary | ICD-10-CM | POA: Diagnosis not present

## 2022-01-04 DIAGNOSIS — M62562 Muscle wasting and atrophy, not elsewhere classified, left lower leg: Secondary | ICD-10-CM | POA: Diagnosis not present

## 2022-01-04 DIAGNOSIS — R2689 Other abnormalities of gait and mobility: Secondary | ICD-10-CM | POA: Diagnosis not present

## 2022-01-04 NOTE — Telephone Encounter (Signed)
Patient due for Prolia on or after 01/11/22.  Patient's copay through pharmacy benefit is $64.  Routing to Dr. Benjamine Mola since patient has not been since starting Prolia and per chart review has had hypocalcemia for past 2-3 months (but not within one month after receiving Prolia)   Latest Reference Range & Units 06/25/21 10:25 08/17/21 00:00 10/09/21 11:18 10/20/21 03:09 10/23/21 00:00 10/28/21 13:09 10/29/21 02:41 10/30/21 05:32 10/31/21 10:06 11/02/21 00:00  Calcium 8.7 - 10.7  8.7 - 10.7  9.5 8.7 (E) 9.2 8.0 (L) 8.6 ! (E) 8.4 (L) 7.3 (L) 7.7 (L) 8.6 (L) 8.4 ! (E) 8.4 ! (E)   First dose of Prolia was 07/15/21.  Knox Saliva, PharmD, MPH, BCPS Clinical Pharmacist (Rheumatology and Pulmonology)

## 2022-01-06 ENCOUNTER — Telehealth (INDEPENDENT_AMBULATORY_CARE_PROVIDER_SITE_OTHER): Payer: Medicare PPO | Admitting: Family Medicine

## 2022-01-06 ENCOUNTER — Other Ambulatory Visit: Payer: Self-pay | Admitting: Family Medicine

## 2022-01-06 ENCOUNTER — Encounter: Payer: Self-pay | Admitting: Family Medicine

## 2022-01-06 VITALS — Temp 98.8°F

## 2022-01-06 DIAGNOSIS — U071 COVID-19: Secondary | ICD-10-CM

## 2022-01-06 DIAGNOSIS — I10 Essential (primary) hypertension: Secondary | ICD-10-CM

## 2022-01-06 MED ORDER — ALBUTEROL SULFATE HFA 108 (90 BASE) MCG/ACT IN AERS: 2.0000 | INHALATION_SPRAY | Freq: Four times a day (QID) | RESPIRATORY_TRACT | 3 refills | Status: DC | PRN

## 2022-01-06 MED ORDER — GUAIFENESIN ER 600 MG PO TB12: 1200.0000 mg | ORAL_TABLET | Freq: Two times a day (BID) | ORAL | 2 refills | Status: DC

## 2022-01-06 MED ORDER — NIRMATRELVIR/RITONAVIR (PAXLOVID)TABLET: ORAL_TABLET | ORAL | 0 refills | Status: DC

## 2022-01-06 NOTE — Progress Notes (Signed)
Virtual Video Visit via MyChart Note converted to telephone   I connected with  Stacy Ayers on 01/06/22 at  2:00 PM EST by the video enabled telemedicine application for MyChart, and verified that I am speaking with the correct person using two identifiers.   I introduced myself as a Designer, jewellery with the practice. We discussed the limitations of evaluation and management by telemedicine and the availability of in person appointments. The patient expressed understanding and agreed to proceed.  Participating parties in this visit include: The patient and the nurse practitioner listed.  The patient is: At home I am: In the office - [facility] [place]:    CC:  Chief Complaint  Patient presents with   Covid Positive    Tested positive yesterday. Dry cough, low grade temp yesterday, sneezing, achy, earache, headache    HPI: Stacy Ayers is a 81 y.o. year old female presenting today via [place] today for + COVID.  Patient reports she started feeling poorly 2 days ago and had a positive COVID test yesterday. Symptoms include ear pain, sore throat, dry cough, nasal congestion, rhinorrhea, low grade fevers, sneezing, body aches, chest tightness (hx of asthma and needing albuterol inhaler last night). States breathing is alright overall and she does not think she needs any prednisone at this time. She denies any chest pain, wheezing, GI/GU symptoms. She has been taking her daily antihistamine and tylenol.      Past medical history, Surgical history, Family history not pertinant except as noted below, Social history, Allergies, and medications have been entered into the medical record, reviewed, and corrections made.   Review of Systems:  All review of systems negative except what is listed in the HPI   Objective:    General:  Speaking clearly in complete sentences. Absent shortness of breath noted.   Alert and oriented x3.   Normal judgment.   Absent acute distress.   Impression and Recommendations:    1. COVID-19 - nirmatrelvir/ritonavir EUA (PAXLOVID) 20 x 150 MG & 10 x 100MG  TABS; 1 dose p.o. twice daily for 5 days, may use any available formulation at pharmacy with the same total dosage.  Dispense: 30 tablet; Refill: 0 - albuterol (PROVENTIL HFA) 108 (90 Base) MCG/ACT inhaler; Inhale 2 puffs into the lungs every 6 (six) hours as needed.  Dispense: 1 each; Refill: 3 - guaiFENesin (MUCINEX) 600 MG 12 hr tablet; Take 2 tablets (1,200 mg total) by mouth 2 (two) times daily.  Dispense: 30 tablet; Refill: 2  Starting paxlovid and adding Mucinex. Refilling albuterol inhaler. Continue supportive measures including rest, hydration, humidifier use, steam showers, warm compresses to sinuses, warm liquids with lemon and honey, and over-the-counter cough, cold, and analgesics as needed.  Patient aware of signs/symptoms requiring further/urgent evaluation. OTC medications, Paxlovid info, and quarantine guidelines added to AVS.   Follow-up if symptoms worsen or fail to improve.    I discussed the assessment and treatment plan with the patient. The patient was provided an opportunity to ask questions and all were answered. The patient agreed with the plan and demonstrated an understanding of the instructions.   The patient was advised to call back or seek an in-person evaluation if the symptoms worsen or if the condition fails to improve as anticipated.  I spent 20 minutes dedicated to the care of this patient on the date of this encounter to include pre-visit chart review of prior notes and results, face-to-face time with the  patient, and post-visit ordering of testing as indicated.   Terrilyn Saver, NP

## 2022-01-06 NOTE — Patient Instructions (Signed)
Paxlovid patient fact sheet: HotterNames.de   Over the counter medications that may be helpful for symptoms:  Guaifenesin 1200 mg extended release tabs twice daily, with plenty of water For cough and congestion Brand name: Mucinex   Pseudoephedrine 30 mg, one or two tabs every 4 to 6 hours For sinus congestion Brand name: Sudafed You must get this from the pharmacy counter.  Oxymetazoline nasal spray each morning, one spray in each nostril, for NO MORE THAN 3 days  For nasal and sinus congestion Brand name: Afrin Saline nasal spray or Saline Nasal Irrigation 3-5 times a day For nasal and sinus congestion Brand names: Ocean or AYR Fluticasone nasal spray, one spray in each nostril, each morning (after oxymetazoline and saline, if used) For nasal and sinus congestion Brand name: Flonase Warm salt water gargles  For sore throat Every few hours as needed Alternate ibuprofen 400-600 mg and acetaminophen 1000 mg every 4-6 hours For fever, body aches, headache Brand names: Motrin or Advil and Tylenol Dextromethorphan 12-hour cough version 30 mg every 12 hours  For cough Brand name: Delsym Stop all other cold medications for now (Nyquil, Dayquil, Tylenol Cold, Theraflu, etc) and other non-prescription cough/cold preparations. Many of these have the same ingredients listed above and could cause an overdose of medication.   Herbal treatments that have been shown to be helpful in some patients include: Vitamin C 1000mg  per day Vitamin D 4000iU per day Zinc 100mg  per day Quercetin 25-500mg  twice a day Melatonin 5-10mg  at bedtime  General Instructions Allow your body to rest Drink PLENTY of fluids Isolate yourself from everyone, even family, until test results have returned  If your COVID-19 test is positive Then you ARE INFECTED and you can pass the virus to others You must quarantine from others for a minimum of  5 days since symptoms started AND You  are fever free for 24 hours WITHOUT any medication to reduce fever AND Your symptoms are improving Wear mask for the next 5 days If not improved by day 5, quarantine for the full 10 days. Do not go to the store or other public areas Do not go around household members who are not known to be infected with COVID-19 If you MUST leave your area of quarantine (example: go to a bathroom you share with others in your home), you must Wear a mask Wash your hands thoroughly Wipe down any surfaces you touch Do not share food, drinks, towels, or other items with other persons Dispose of your own tissues, food containers, etc  Once you have recovered, please continue good preventive care measures, including:  wearing a mask when in public wash your hands frequently avoid touching your face/nose/eyes cover coughs/sneezes with the inside of your elbow stay out of crowds keep a 6 foot distance from others  If you develop severe shortness of breath, uncontrolled fevers, coughing up blood, confusion, chest pain, or signs of dehydration (such as significantly decreased urine amounts or dizziness with standing) please go to the ER.

## 2022-01-07 ENCOUNTER — Encounter: Payer: Self-pay | Admitting: Family Medicine

## 2022-01-07 NOTE — Telephone Encounter (Signed)
Spoke with patient in regards to scheduling an office visit. Patient states she has Covid and her quarantine wouldn't be over for a week or so. Scheduled first available after quarantine 01/25/22.

## 2022-01-07 NOTE — Telephone Encounter (Signed)
That's probably for the best if we can schedule her for a visit this month. We might need to look into some alternate causes for the hypocalcemia. She had low albumin during much of that time, so we can check the ionized calcium to see if this just coming from that too.

## 2022-01-08 ENCOUNTER — Other Ambulatory Visit (HOSPITAL_COMMUNITY): Payer: Self-pay

## 2022-01-12 ENCOUNTER — Institutional Professional Consult (permissible substitution): Payer: Medicare PPO | Admitting: Cardiology

## 2022-01-20 ENCOUNTER — Other Ambulatory Visit (HOSPITAL_COMMUNITY): Payer: Self-pay

## 2022-01-20 NOTE — Telephone Encounter (Signed)
Prolia received in the office and placed in the fridge. Thanks!

## 2022-01-21 DIAGNOSIS — H524 Presbyopia: Secondary | ICD-10-CM | POA: Diagnosis not present

## 2022-01-21 DIAGNOSIS — H35372 Puckering of macula, left eye: Secondary | ICD-10-CM | POA: Diagnosis not present

## 2022-01-21 DIAGNOSIS — H5211 Myopia, right eye: Secondary | ICD-10-CM | POA: Diagnosis not present

## 2022-01-21 DIAGNOSIS — Z9849 Cataract extraction status, unspecified eye: Secondary | ICD-10-CM | POA: Diagnosis not present

## 2022-01-21 DIAGNOSIS — Z961 Presence of intraocular lens: Secondary | ICD-10-CM | POA: Diagnosis not present

## 2022-01-21 DIAGNOSIS — H52223 Regular astigmatism, bilateral: Secondary | ICD-10-CM | POA: Diagnosis not present

## 2022-01-24 NOTE — Progress Notes (Signed)
Office Visit Note  Patient: Stacy Ayers             Date of Birth: 1941/11/26           MRN: [id number]             PCP: [name], DO Referring: [name], * Visit Date: 01/25/2022   Subjective:  Follow-up (Prolia, doing good)   History of Present Illness: Stacy Ayers is a 81 y.o. female here for follow up for osteoporosis on prolia treatment. Her most recent labs have shown persistent mild hypocalcemia. She had a recent COVID illness earlier this month so delayed initial time for this. She had left knee replacement surgery in November this was mildly complicated by cardiac arrhythmia then also hospitalized again with abdominal pain thought to be from lactic acidosis related to cystitis and improved after fluids and antibiotics treatment. She has lost weight 10-20 lbs throughout this process. She is graduating from physical therapy today. She has noticed some numbness and finger triggering in her hands first thing after waking and when driving. She had carpal tunnel syndrome years ago before her gastric bypass surgery treated with wrist splints but not since then.  Previous HPI 06/25/21 Stacy Ayers is a 81 y.o. 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  She has been on treatment with Prolia since around 4 years ago due to age-related osteoporosis identified on bone densitometry without any pathologic fractures.  She has been off the medication for about 1 year she is not entirely sure why some appointments were rescheduled related to COVID issues.  She believes was tolerating the medication well she had some generalized stiffness and body aches but these were chronic and have also continued in the past year with no medication.  She has a history of right shoulder reverse arthroplasty for osteoarthritis she is currently seeing Dr. Wynelle Link for left knee osteoarthritis considering arthroplasty.  She  has had a fracture of her right fifth finger no other joint fractures.  She had a fall last year she reports as tripping when trying to walk to her door while distracted this was evaluated at the ED and no significant injury was sustained. She was not a candidate for oral antiresorptive therapy due to history of bariatric surgery.  She takes vitamin D supplementation regularly she does not take calcium regularly due to GI side effects so she does not absorb this well.  She reports a total height loss of about 0.75".   Review of Systems  Constitutional:  Positive for fatigue.  HENT:  Negative for mouth dryness.   Eyes:  Positive for dryness.  Respiratory:  Negative for shortness of breath.   Cardiovascular:  Negative for swelling in legs/feet.  Gastrointestinal:  Positive for diarrhea.  Endocrine: Negative for excessive thirst.  Genitourinary:  Negative for difficulty urinating.  Musculoskeletal:  Positive for muscle weakness and morning stiffness.  Skin:  Negative for rash.  Allergic/Immunologic: Negative for susceptible to infections.  Neurological:  Positive for numbness.  Hematological:  Positive for bruising/bleeding tendency.  Psychiatric/Behavioral:  Negative for sleep disturbance.    PMFS History:  Patient Active Problem List   Diagnosis Date Noted   Hypocalcemia 01/25/2022   Pressure injury of skin 10/29/2021   Lactic acidosis 10/28/2021   Left knee pain 10/28/2021   Primary osteoarthritis of left knee 10/19/2021   Pre-op evaluation 10/12/2021   Depression,  major, single episode, mild ([place]) 07/24/2021   Age-related osteoporosis without current pathological fracture 06/25/2021   Diabetes mellitus ([place]) 04/28/2021   DM (diabetes mellitus) type II, controlled, with peripheral vascular disorder ([place]) 04/28/2021   Morbid obesity ([place]) 04/28/2021   Anemia 04/28/2021   Fatigue 03/23/2021   Right ear pain 11/06/2020   Osteoarthritis of right knee 10/01/2020   History of reverse  total replacement of right shoulder joint 08/20/2020   Viral upper respiratory tract infection 11/14/2018   OA (osteoarthritis) of knee 05/12/2018   Dysuria 05/03/2018   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   Seasonal allergies 08/11/2015   Alcohol dependence ([place]) 08/11/2015   CTS (carpal tunnel syndrome) 08/11/2015   Allergic rhinitis 12/19/2013   B12 deficiency 05/22/2013   Left foot pain 09/06/2012   Depression with anxiety 04/05/2012   Abdominal pain, generalized 01/18/2011   PERSONAL HISTORY OF COLONIC POLYPS 01/18/2011   SPONTANEOUS ECCHYMOSES 10/13/2010   DIARRHEA, CHRONIC 10/13/2010   PHARYNGITIS, CHRONIC 02/16/2010   Hypothyroidism 01/14/2010   History of bariatric surgery 02/25/2009   Vitamin D deficiency 09/10/2008   VARICOSE VEINS, LOWER EXTREMITIES 09/10/2008   PULMONARY NODULE 08/14/2008   FATIGUE 08/14/2008   MUSCLE PAIN 06/14/2007   BLADDER PAIN 06/14/2007   COLONIC POLYPS 03/14/2007   History of diet-controlled diabetes 03/14/2007   Hyperlipidemia LDL goal <100 03/14/2007   ANEMIA-IRON DEFICIENCY 03/14/2007   Essential hypertension 03/14/2007   VOCAL CORD PARALYSIS 03/14/2007   EDEMA, LARYNX 03/14/2007   ASTHMA 03/14/2007   Esophageal reflux 03/14/2007   LIVER FUNCTION TESTS, ABNORMAL, HX OF 03/14/2007   TONSILLECTOMY AND ADENOIDECTOMY, HX OF 03/14/2007    Past Medical History:  Diagnosis Date   Abnormal liver function tests    Allergy    Anxiety    Arthritis    knees, lower back    Asthma    Cancer ([place])    Cataract    left eye growing cataract    Depression    Diabetes mellitus    had gastric bypass DM resolved-    Diverticulosis    GERD (gastroesophageal reflux disease)    Headache    Previous migraines   Heart murmur    History of colon polyps    adenomatous   Hyperlipidemia    past hx- resloved after gastric bypass    Hypertension    Hypothyroidism    IBS (irritable bowel syndrome)    Iron deficiency anemia    Osteoporosis     Family  History  Problem Relation Age of Onset   Stroke Mother    Asthma Mother    Anuerysm Mother    Obesity Father    Diabetes Sister    Heart disease Maternal Grandmother    Angina Maternal Grandmother    Suicidality Maternal Grandfather    Cancer Paternal Grandmother    Colon cancer Neg Hx    Colon polyps Neg Hx    Esophageal cancer Neg Hx    Rectal cancer Neg Hx    Stomach cancer Neg Hx    Pancreatic cancer Neg Hx    Liver disease Neg Hx    Past Surgical History:  Procedure Laterality Date   ABDOMINAL HYSTERECTOMY     ABDOMINAL HYSTERECTOMY     APPENDECTOMY     BLADDER REPAIR     BREAST BIOPSY Right    needle core biopsy, benign   CATARACT EXTRACTION Left    CHOLECYSTECTOMY     COLONOSCOPY     CORONARY ANGIOPLASTY  GASTRIC BYPASS     POLYPECTOMY     REVERSE SHOULDER ARTHROPLASTY Right 07/15/2020   Procedure: REVERSE SHOULDER ARTHROPLASTY;  Surgeon: Justice Britain, MD;  Location: [facility];  Service: Orthopedics;  Laterality: Right;  122min   TONSILLECTOMY AND ADENOIDECTOMY     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   Procedure: TOTAL KNEE ARTHROPLASTY;  Surgeon: Gaynelle Arabian, MD;  Location: [facility];  Service: Orthopedics;  Laterality: Left;   Social History   Social History Narrative   Daily caffeine   Immunization History  Administered Date(s) Administered   Fluad Quad(high Dose 65+) 10/12/2021   Influenza Split 09/08/2011, 08/25/2012   Influenza Whole 08/26/2010   Influenza, High Dose Seasonal PF 08/06/2016, 08/05/2017, 10/31/2018   Influenza, Quadrivalent, Recombinant, Inj, Pf 09/07/2019   Influenza,inj,Quad PF,6+ Mos 08/09/2014, 08/08/2015   Influenza,inj,quad, With Preservative 08/01/2017   Moderna Covid-19 Vaccine Bivalent Booster 82yrs & up 11/17/2021   Moderna Sars-Covid-2 Vaccination 12/11/2019, 01/08/2020, 10/14/2020, 03/03/2021   Pneumococcal Conjugate-13 12/27/2014   Pneumococcal Polysaccharide-23 08/05/2010, 08/05/2017   Tdap 07/12/2011   Zoster  Recombinat (Shingrix) 09/07/2019   Zoster, Live 07/21/2011     Objective: Vital Signs: BP 129/73 (BP Location: Left Arm, Patient Position: Sitting, Cuff Size: Normal)    Pulse 92    Resp 16    Ht 5\' 7"  (1.702 m)    Wt 164 lb (74.4 kg)    BMI 25.69 kg/m    Physical Exam Cardiovascular:     Rate and Rhythm: Normal rate and regular rhythm.  Pulmonary:     Effort: Pulmonary effort is normal.     Breath sounds: Normal breath sounds.  Skin:    General: Skin is warm and dry.  Neurological:     General: No focal deficit present.     Mental Status: She is alert.     Deep Tendon Reflexes: Reflexes normal.  Psychiatric:        Mood and Affect: Mood normal.     Musculoskeletal Exam:  Elbows full ROM no tenderness or swelling Wrists full ROM no tenderness or swelling Fingers full ROM no tenderness or swelling Left knee warm to touch, well healed scar, has full extension, flexion decreased about 100 degrees total ROM  Limited ultrasound inspection of wrists showing left median nerve CSA 53mm^2 right side with bifid median nerve CSA not calculated  Investigation: No additional findings.  Imaging: No results found.  Recent Labs: Lab Results  Component Value Date   WBC 3.9 (L) 11/17/2021   HGB 10.0 (L) 11/17/2021   PLT 231.0 11/17/2021   NA 142 11/02/2021   K 4.3 11/02/2021   CL 105 11/02/2021   CO2 24 (A) 11/02/2021   GLUCOSE 141 (H) 10/31/2021   BUN 8 11/02/2021   CREATININE 0.6 11/02/2021   CREATININE 0.6 11/02/2021   BILITOT 1.4 (H) 10/31/2021   ALKPHOS 60 10/31/2021   AST 17 10/31/2021   ALT 15 10/31/2021   PROT 6.6 10/31/2021   ALBUMIN 3.3 (L) 10/31/2021   CALCIUM 8.4 (A) 11/02/2021   CALCIUM 8.4 (A) 11/02/2021   GFRAA >60 07/09/2020    Speciality Comments: No specialty comments available.  Procedures:  No procedures performed Allergies: Sulfonamide derivatives, Cocaine, Montelukast sodium, Penicillins, Pioglitazone, Rosiglitazone maleate, Secobarbital sodium,  Cefdinir, Codeine, and Doxycycline   Assessment / Plan:     Visit Diagnoses: Age-related osteoporosis without current pathological fracture  Hypocalcemia  - Plan: denosumab (PROLIA) injection 60 mg, Calcium, ionized, COMPLETE METABOLIC PANEL WITH GFR, VITAMIN D 25 Hydroxy (Vit-D Deficiency,  Fractures)  Doing well with the Prolia she did not have a significant side effects or body pains with last dosing.  Repeat dose today.  She has had a persistent mild hypocalcemia on previous metabolic panel.  I suspect this may be due to mild hypoalbuminemia possibly poor protein absorption from history of Roux-en-Y gastric bypass.  Checking ionized calcium as well as repeat CMP today.  Also repeating vitamin D.  She reports poor tolerance of oral calcium when previously supplementing worsened her diarrhea. If needed alternate form might be more tolerated eg calcium carbonate vs citrate.  S/P gastric bypass  Chronic diarrhea and may have some mild malabsorption related to this.  Vitamin D deficiency - Plan: VITAMIN D 25 Hydroxy (Vit-D Deficiency, Fractures)  Rechecking for current level with hypocalcemia, although was in adequate range last year.  Orders: Orders Placed This Encounter  Procedures   Calcium, ionized   COMPLETE METABOLIC PANEL WITH GFR   VITAMIN D 25 Hydroxy (Vit-D Deficiency, Fractures)   Meds ordered this encounter  Medications   denosumab (PROLIA) injection 60 mg    Order Specific Question:   Patient is enrolled in [facility] program for this medication and I have provided a copy of the Prolia Medication Guide and Patient Brochure.    Answer:   Yes    Order Specific Question:   I have reviewed with the patient the information in the Prolia Medication Guide and Patient Counseling Chart including the serious risks of Prolia and symptoms of each risk.    Answer:   Yes    Order Specific Question:   I have advised the patient to seek medical attention if they have signs or symptoms of any of  the serious risks.    Answer:   Yes     Follow-Up Instructions: Return in about 1 year (around 01/25/2023) for Ostoeporosis on prolia f/u 1 yr.   Collier Salina, MD  Note - This record has been created using [employer].  Chart creation errors have been sought, but may not always  have been located. Such creation errors do not reflect on  the standard of medical care.

## 2022-01-25 ENCOUNTER — Ambulatory Visit: Payer: Medicare PPO | Admitting: Internal Medicine

## 2022-01-25 ENCOUNTER — Other Ambulatory Visit: Payer: Self-pay

## 2022-01-25 ENCOUNTER — Encounter: Payer: Self-pay | Admitting: Internal Medicine

## 2022-01-25 VITALS — BP 129/73 | HR 92 | Resp 16 | Ht 67.0 in | Wt 164.0 lb

## 2022-01-25 DIAGNOSIS — M810 Age-related osteoporosis without current pathological fracture: Secondary | ICD-10-CM

## 2022-01-25 DIAGNOSIS — Z9884 Bariatric surgery status: Secondary | ICD-10-CM

## 2022-01-25 DIAGNOSIS — E559 Vitamin D deficiency, unspecified: Secondary | ICD-10-CM

## 2022-01-25 DIAGNOSIS — K529 Noninfective gastroenteritis and colitis, unspecified: Secondary | ICD-10-CM | POA: Diagnosis not present

## 2022-01-25 MED ORDER — DENOSUMAB 60 MG/ML ~~LOC~~ SOSY
60.0000 mg | PREFILLED_SYRINGE | Freq: Once | SUBCUTANEOUS | Status: AC
  Administered 2022-01-25: 60 mg via SUBCUTANEOUS

## 2022-01-25 NOTE — Progress Notes (Signed)
Pharmacy Note  Subjective:   Patient presents to clinic today to receive bi-annual dose of Prolia.  Patient running a fever or have signs/symptoms of infection? No  Patient currently on antibiotics for the treatment of infection? No  Patient had fall in the last 6 months?  No  If yes, did it require medical attention? No   Patient taking calcium 1200 mg daily through diet or supplement and at least 800 units vitamin D? Yes  Objective: CMP     Component Value Date/Time   NA 142 11/02/2021 0000   K 4.3 11/02/2021 0000   CL 105 11/02/2021 0000   CO2 24 (A) 11/02/2021 0000   GLUCOSE 141 (H) 10/31/2021 1006   BUN 8 11/02/2021 0000   CREATININE 0.6 11/02/2021 0000   CREATININE 0.6 11/02/2021 0000   CREATININE 0.59 10/31/2021 1006   CALCIUM 8.4 (A) 11/02/2021 0000   CALCIUM 8.4 (A) 11/02/2021 0000   PROT 6.6 10/31/2021 1006   PROT 7.2 04/18/2017 1517   ALBUMIN 3.3 (L) 10/31/2021 1006   ALBUMIN 4.5 04/18/2017 1517   AST 17 10/31/2021 1006   ALT 15 10/31/2021 1006   ALKPHOS 60 10/31/2021 1006   BILITOT 1.4 (H) 10/31/2021 1006   BILITOT 0.2 04/18/2017 1517   GFRNONAA >60 10/31/2021 1006   GFRAA >60 07/09/2020 0859    CBC    Component Value Date/Time   WBC 3.9 (L) 11/17/2021 1201   RBC 3.20 (L) 11/17/2021 1201   HGB 10.0 (L) 11/17/2021 1201   HGB 12.6 05/17/2006 1039   HCT 30.7 (L) 11/17/2021 1201   HCT 36.5 05/17/2006 1039   PLT 231.0 11/17/2021 1201   PLT 155 05/17/2006 1039   MCV 96.1 11/17/2021 1201   MCV 90.5 05/17/2006 1039   MCH 29.5 10/31/2021 1006   MCHC 32.4 11/17/2021 1201   RDW 18.4 (H) 11/17/2021 1201   RDW 14.7 (H) 05/17/2006 1039   LYMPHSABS 1.1 11/17/2021 1201   LYMPHSABS 1.1 05/17/2006 1039   MONOABS 0.3 11/17/2021 1201   MONOABS 0.3 05/17/2006 1039   EOSABS 0.1 11/17/2021 1201   EOSABS 0.1 05/17/2006 1039   BASOSABS 0.0 11/17/2021 1201   BASOSABS 0.0 05/17/2006 1039    Lab Results  Component Value Date   VD25OH 32 06/25/2021    T-score:  -3.8  Assessment/Plan:   Administrations This Visit     denosumab (PROLIA) injection 60 mg     Admin Date 01/25/2022 Action Given Dose 60 mg Route Subcutaneous Administered By Carole Binning, LPN             Patient tolerated injection well.    All questions encouraged and answered.  Instructed patient to call with any further questions or concerns.

## 2022-01-26 LAB — COMPLETE METABOLIC PANEL WITH GFR
AG Ratio: 1.7 (calc) (ref 1.0–2.5)
ALT: 9 U/L (ref 6–29)
AST: 12 U/L (ref 10–35)
Albumin: 3.8 g/dL (ref 3.6–5.1)
Alkaline phosphatase (APISO): 68 U/L (ref 37–153)
BUN: 11 mg/dL (ref 7–25)
CO2: 25 mmol/L (ref 20–32)
Calcium: 9.5 mg/dL (ref 8.6–10.4)
Chloride: 106 mmol/L (ref 98–110)
Creat: 0.63 mg/dL (ref 0.60–1.00)
Globulin: 2.3 g/dL (calc) (ref 1.9–3.7)
Glucose, Bld: 208 mg/dL — ABNORMAL HIGH (ref 65–99)
Potassium: 4.4 mmol/L (ref 3.5–5.3)
Sodium: 141 mmol/L (ref 135–146)
Total Bilirubin: 0.4 mg/dL (ref 0.2–1.2)
Total Protein: 6.1 g/dL (ref 6.1–8.1)
eGFR: 91 mL/min/{1.73_m2} (ref >=60)

## 2022-01-26 LAB — VITAMIN D 25 HYDROXY (VIT D DEFICIENCY, FRACTURES): Vit D, 25-Hydroxy: 30 ng/mL (ref 30–100)

## 2022-01-26 LAB — CALCIUM, IONIZED: Calcium, Ion: 5.25 mg/dL (ref 4.8–5.6)

## 2022-01-26 NOTE — Progress Notes (Signed)
Calcium and albumin are both back to completely normal today. I am not sure why it was low before and now improved but this is good and no change in plan needed. Her vitamin D is 30 which is still good but the very bottom of target level I recommend she increase daily supplement by 1000 units.

## 2022-01-26 NOTE — Telephone Encounter (Signed)
Patient received Prolia yesterday, 01/25/23 per Dr. Marveen Reeks okay  Knox Saliva, PharmD, MPH, BCPS Clinical Pharmacist (Rheumatology and Pulmonology)

## 2022-01-27 DIAGNOSIS — Z471 Aftercare following joint replacement surgery: Secondary | ICD-10-CM | POA: Diagnosis not present

## 2022-01-27 DIAGNOSIS — R278 Other lack of coordination: Secondary | ICD-10-CM | POA: Diagnosis not present

## 2022-01-27 DIAGNOSIS — R2689 Other abnormalities of gait and mobility: Secondary | ICD-10-CM | POA: Diagnosis not present

## 2022-01-27 DIAGNOSIS — M62562 Muscle wasting and atrophy, not elsewhere classified, left lower leg: Secondary | ICD-10-CM | POA: Diagnosis not present

## 2022-01-27 DIAGNOSIS — M1712 Unilateral primary osteoarthritis, left knee: Secondary | ICD-10-CM | POA: Diagnosis not present

## 2022-01-30 ENCOUNTER — Other Ambulatory Visit: Payer: Self-pay | Admitting: Family Medicine

## 2022-01-30 DIAGNOSIS — K219 Gastro-esophageal reflux disease without esophagitis: Secondary | ICD-10-CM

## 2022-02-15 ENCOUNTER — Ambulatory Visit (INDEPENDENT_AMBULATORY_CARE_PROVIDER_SITE_OTHER): Payer: Medicare PPO

## 2022-02-15 VITALS — Ht 67.0 in | Wt 164.0 lb

## 2022-02-15 DIAGNOSIS — Z0000 Encounter for general adult medical examination without abnormal findings: Secondary | ICD-10-CM | POA: Diagnosis not present

## 2022-02-15 NOTE — Patient Instructions (Signed)
[name] , ?Thank you for taking time to complete your Medicare Wellness Visit. I appreciate your ongoing commitment to your health goals. Please review the following plan we discussed and let me know if I can assist you in the future.  ? ?Screening recommendations/referrals: ?Colonoscopy: No longer required ?Mammogram: Completed 05/18/2021-Due 05/18/2022 ?Bone Density:Completed 05/18/2021-Due 05/19/2023 ?Recommended yearly ophthalmology/optometry visit for glaucoma screening and checkup ?Recommended yearly dental visit for hygiene and checkup ? ?Vaccinations: ?Influenza vaccine: Up to date ?Pneumococcal vaccine: Up to date ?Tdap vaccine: Due-May obtain vaccine at your local pharmacy. ?Shingles vaccine: Completed vaccines   ?Covid-19:Up to date ? ?Advanced directives: Please bring a copy of Living Will and/or Healthcare Power of Attorney for your chart. ? ? ?Conditions/risks identified: See problem list ? ?Next appointment: Follow up in one year for your annual wellness visit  ? ? ?Preventive Care 27 Years and Older, Female ?Preventive care refers to lifestyle choices and visits with your health care provider that can promote health and wellness. ?What does preventive care include? ?A yearly physical exam. This is also called an annual well check. ?Dental exams once or twice a year. ?Routine eye exams. Ask your health care provider how often you should have your eyes checked. ?Personal lifestyle choices, including: ?Daily care of your teeth and gums. ?Regular physical activity. ?Eating a healthy diet. ?Avoiding tobacco and drug use. ?Limiting alcohol use. ?Practicing safe sex. ?Taking low-dose aspirin every day. ?Taking vitamin and mineral supplements as recommended by your health care provider. ?What happens during an annual well check? ?The services and screenings done by your health care provider during your annual well check will depend on your age, overall health, lifestyle risk factors, and family history of  disease. ?Counseling  ?Your health care provider may ask you questions about your: ?Alcohol use. ?Tobacco use. ?Drug use. ?Emotional well-being. ?Home and relationship well-being. ?Sexual activity. ?Eating habits. ?History of falls. ?Memory and ability to understand (cognition). ?Work and work Statistician. ?Reproductive health. ?Screening  ?You may have the following tests or measurements: ?Height, weight, and BMI. ?Blood pressure. ?Lipid and cholesterol levels. These may be checked every 5 years, or more frequently if you are over 55 years old. ?Skin check. ?Lung cancer screening. You may have this screening every year starting at age 52 if you have a 30-pack-year history of smoking and currently smoke or have quit within the past 15 years. ?Fecal occult blood test (FOBT) of the stool. You may have this test every year starting at age 84. ?Flexible sigmoidoscopy or colonoscopy. You may have a sigmoidoscopy every 5 years or a colonoscopy every 10 years starting at age 63. ?Hepatitis C blood test. ?Hepatitis B blood test. ?Sexually transmitted disease (STD) testing. ?Diabetes screening. This is done by checking your blood sugar (glucose) after you have not eaten for a while (fasting). You may have this done every 1-3 years. ?Bone density scan. This is done to screen for osteoporosis. You may have this done starting at age 46. ?Mammogram. This may be done every 1-2 years. Talk to your health care provider about how often you should have regular mammograms. ?Talk with your health care provider about your test results, treatment options, and if necessary, the need for more tests. ?Vaccines  ?Your health care provider may recommend certain vaccines, such as: ?Influenza vaccine. This is recommended every year. ?Tetanus, diphtheria, and acellular pertussis (Tdap, Td) vaccine. You may need a Td booster every 10 years. ?Zoster vaccine. You may need this after age 104. ?Pneumococcal  13-valent conjugate (PCV13) vaccine. One  dose is recommended after age 35. ?Pneumococcal polysaccharide (PPSV23) vaccine. One dose is recommended after age 28. ?Talk to your health care provider about which screenings and vaccines you need and how often you need them. ?This information is not intended to replace advice given to you by your health care provider. Make sure you discuss any questions you have with your health care provider. ?Document Released: 12/12/2015 Document Revised: 08/04/2016 Document Reviewed: 09/16/2015 ?Elsevier Interactive Patient Education ? 2017 [place]. ? ?Fall Prevention in the Home ?Falls can cause injuries. They can happen to people of all ages. There are many things you can do to make your home safe and to help prevent falls. ?What can I do on the outside of my home? ?Regularly fix the edges of walkways and driveways and fix any cracks. ?Remove anything that might make you trip as you walk through a door, such as a raised step or threshold. ?Trim any bushes or trees on the path to your home. ?Use bright outdoor lighting. ?Clear any walking paths of anything that might make someone trip, such as rocks or tools. ?Regularly check to see if handrails are loose or broken. Make sure that both sides of any steps have handrails. ?Any raised decks and porches should have guardrails on the edges. ?Have any leaves, snow, or ice cleared regularly. ?Use sand or salt on walking paths during winter. ?Clean up any spills in your garage right away. This includes oil or grease spills. ?What can I do in the bathroom? ?Use night lights. ?Install grab bars by the toilet and in the tub and shower. Do not use towel bars as grab bars. ?Use non-skid mats or decals in the tub or shower. ?If you need to sit down in the shower, use a plastic, non-slip stool. ?Keep the floor dry. Clean up any water that spills on the floor as soon as it happens. ?Remove soap buildup in the tub or shower regularly. ?Attach bath mats securely with double-sided  non-slip rug tape. ?Do not have throw rugs and other things on the floor that can make you trip. ?What can I do in the bedroom? ?Use night lights. ?Make sure that you have a light by your bed that is easy to reach. ?Do not use any sheets or blankets that are too big for your bed. They should not hang down onto the floor. ?Have a firm chair that has side arms. You can use this for support while you get dressed. ?Do not have throw rugs and other things on the floor that can make you trip. ?What can I do in the kitchen? ?Clean up any spills right away. ?Avoid walking on wet floors. ?Keep items that you use a lot in easy-to-reach places. ?If you need to reach something above you, use a strong step stool that has a grab bar. ?Keep electrical cords out of the way. ?Do not use floor polish or wax that makes floors slippery. If you must use wax, use non-skid floor wax. ?Do not have throw rugs and other things on the floor that can make you trip. ?What can I do with my stairs? ?Do not leave any items on the stairs. ?Make sure that there are handrails on both sides of the stairs and use them. Fix handrails that are broken or loose. Make sure that handrails are as long as the stairways. ?Check any carpeting to make sure that it is firmly attached to the stairs. Fix any carpet  that is loose or worn. ?Avoid having throw rugs at the top or bottom of the stairs. If you do have throw rugs, attach them to the floor with carpet tape. ?Make sure that you have a light switch at the top of the stairs and the bottom of the stairs. If you do not have them, ask someone to add them for you. ?What else can I do to help prevent falls? ?Wear shoes that: ?Do not have high heels. ?Have rubber bottoms. ?Are comfortable and fit you well. ?Are closed at the toe. Do not wear sandals. ?If you use a stepladder: ?Make sure that it is fully opened. Do not climb a closed stepladder. ?Make sure that both sides of the stepladder are locked into place. ?Ask  someone to hold it for you, if possible. ?Clearly mark and make sure that you can see: ?Any grab bars or handrails. ?First and last steps. ?Where the edge of each step is. ?Use tools that help you move around (mobi

## 2022-02-15 NOTE — Progress Notes (Signed)
? ?Subjective:  ? Stacy Ayers is a 81 y.o. female who presents for Medicare Annual (Subsequent) preventive examination. ? ?I connected with [employer] today by telephone and verified that I am speaking with the correct person using two identifiers. ?Location patient: home ?Location provider: work ?Persons participating in the virtual visit: patient, nurse.  ?  ?I discussed the limitations, risks, security and privacy concerns of performing an evaluation and management service by telephone and the availability of in person appointments. I also discussed with the patient that there may be a patient responsible charge related to this service. The patient expressed understanding and verbally consented to this telephonic visit.  ?  ?Interactive audio and video telecommunications were attempted between this provider and patient, however failed, due to patient having technical difficulties OR patient did not have access to video capability.  We continued and completed visit with audio only. ? ?Some vital signs may be absent or patient reported.  ? ?Time Spent with patient on telephone encounter: 20 minutes ? ? ?Review of Systems    ? ?Cardiac Risk Factors include: advanced age (>33mn, >>33women);diabetes mellitus;hypertension ? ?   ?Objective:  ?  ?Today's Vitals  ? 02/15/22 1440  ?Weight: 164 lb (74.4 kg)  ?Height: '5\' 7"'$  (1.702 m)  ? ?Body mass index is 25.69 kg/m?. ? ?Advanced Directives 02/15/2022 11/06/2021 11/02/2021 10/31/2021 10/28/2021 10/26/2021 10/19/2021  ?Does Patient Have a Medical Advance Directive? Yes Yes Yes Yes Yes Yes Yes  ?Type of AParamed[place]of [place] will Living will;Healthcare Power of Attorney Living will;Healthcare Power of Attorney Living will;Healthcare Power of [place] will [place] will [place] will  ?Does patient want to make changes to medical advance directive? - No -  Patient declined No - Patient declined No - Patient declined No - Patient declined No - Patient declined No - Patient declined  ?Copy of [place] Chart? No - copy requested Yes - validated most recent copy scanned in chart (See row information) Yes - validated most recent copy scanned in chart (See row information) No - copy requested, Physician notified No - copy requested No - copy requested No - copy requested  ?Would patient like information on creating a medical advance directive? - - - - - - -  ? ? ?Current Medications (verified) ?Outpatient Encounter Medications as of 02/15/2022  ?Medication Sig  ? acetaminophen (TYLENOL) 650 MG CR tablet Take 650 mg by mouth every 8 (eight) hours as needed for pain.  ? albuterol (PROVENTIL HFA) 108 (90 Base) MCG/ACT inhaler Inhale 2 puffs into the lungs every 6 (six) hours as needed.  ? denosumab (PROLIA) 60 MG/ML SOSY injection Inject 60 mg into the skin every 6 (six) months. [place] [address], [id number] ? diclofenac Sodium (VOLTAREN) 1 % GEL Apply 2 g topically 4 (four) times daily. Do not apply to open area, apply to knee pain area  ? ferrous sulfate 325 (65 FE) MG tablet Take 325 mg by mouth daily with breakfast.  ? guaiFENesin (MUCINEX) 600 MG 12 hr tablet Take 2 tablets (1,200 mg total) by mouth 2 (two) times daily.  ? losartan (COZAAR) 50 MG tablet Take 1 tablet (50 mg total) by mouth daily.  ? metoprolol succinate (TOPROL-XL) 50 MG 24 hr tablet Take 1 tablet (50 mg total) by mouth in the AM, then take 1/2 tablet (25 mg total) by mouth in  the PM, everyday. Take with or immediately following a meal.  ? metoprolol tartrate (LOPRESSOR) 25 MG tablet Take 1 tablet (25 mg total) by mouth 3 (three) times daily as needed (palpitations).  ? pantoprazole (PROTONIX) 40 MG tablet TAKE 1 TABLET ONCE DAILY.  ? SYNTHROID 137 MCG tablet TAKE 1 TABLET ONCE DAILY ON MONDAY, WEDNESDAY, AND FRIDAY. TAKE 150MCG DAILY ALL OTHER DAYS.  ?  SYNTHROID 150 MCG tablet Take 1 tablet on Sun, Tues, Thurs, and Sat.  ? venlafaxine XR (EFFEXOR-XR) 75 MG 24 hr capsule TAKE 1 CAPSULE ONCE DAILY WITH BREAKFAST.  ? COVID-19 mRNA bivalent vaccine, Moderna, (MODERNA COVID-19 BIVAL BOOSTER) 50 MCG/0.5ML injection Inject into the muscle. (Patient not taking: Reported on 02/15/2022)  ? docusate sodium (COLACE) 100 MG capsule Take 100 mg by mouth 2 (two) times daily as needed for mild constipation. (Patient not taking: Reported on 01/25/2022)  ? methocarbamol (ROBAXIN) 500 MG tablet Take 1 tablet (500 mg total) by mouth every 6 (six) hours as needed for muscle spasms. (Patient not taking: Reported on 01/25/2022)  ? nirmatrelvir/ritonavir EUA (PAXLOVID) 20 x 150 MG & 10 x '100MG'$  TABS 1 dose p.o. twice daily for 5 days, may use any available formulation at pharmacy with the same total dosage. (Patient not taking: Reported on 01/25/2022)  ? polyethylene glycol (MIRALAX / GLYCOLAX) 17 g packet Take 17 g by mouth daily as needed for mild constipation. (Patient not taking: Reported on 01/25/2022)  ? ?No facility-administered encounter medications on file as of 02/15/2022.  ? ? ?Allergies (verified) ?Sulfonamide derivatives, Cocaine, Montelukast sodium, Penicillins, Pioglitazone, Rosiglitazone maleate, Secobarbital sodium, Cefdinir, Codeine, and Doxycycline  ? ?History: ?Past Medical History:  ?Diagnosis Date  ? Abnormal liver function tests   ? Allergy   ? Anxiety   ? Arthritis   ? knees, lower back   ? Asthma   ? Cancer [facility])   ? Cataract   ? left eye growing cataract   ? Depression   ? Diabetes mellitus   ? had gastric bypass DM resolved-   ? Diverticulosis   ? GERD (gastroesophageal reflux disease)   ? Headache   ? Previous migraines  ? Heart murmur   ? History of colon polyps   ? adenomatous  ? Hyperlipidemia   ? past hx- resloved after gastric bypass   ? Hypertension   ? Hypothyroidism   ? IBS (irritable bowel syndrome)   ? Iron deficiency anemia   ? Osteoporosis   ? ?Past  Surgical History:  ?Procedure Laterality Date  ? ABDOMINAL HYSTERECTOMY    ? ABDOMINAL HYSTERECTOMY    ? APPENDECTOMY    ? BLADDER REPAIR    ? BREAST BIOPSY Right   ? needle core biopsy, benign  ? CATARACT EXTRACTION Left   ? CHOLECYSTECTOMY    ? COLONOSCOPY    ? CORONARY ANGIOPLASTY    ? GASTRIC BYPASS    ? POLYPECTOMY    ? REVERSE SHOULDER ARTHROPLASTY Right 07/15/2020  ? Procedure: REVERSE SHOULDER ARTHROPLASTY;  Surgeon: Justice Britain, MD;  Location: [facility];  Service: Orthopedics;  Laterality: Right;  153mn  ? TONSILLECTOMY AND ADENOIDECTOMY    ? 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  ? Procedure: TOTAL KNEE ARTHROPLASTY;  Surgeon: AGaynelle Arabian MD;  Location: [facility];  Service: Orthopedics;  Laterality: Left;  ? ?Family History  ?Problem Relation Age of Onset  ? Stroke Mother   ? Asthma Mother   ? Anuerysm Mother   ? Obesity Father   ? Diabetes Sister   ?  Heart disease Maternal Grandmother   ? Angina Maternal Grandmother   ? Suicidality Maternal Grandfather   ? Cancer Paternal Grandmother   ? Colon cancer Neg Hx   ? Colon polyps Neg Hx   ? Esophageal cancer Neg Hx   ? Rectal cancer Neg Hx   ? Stomach cancer Neg Hx   ? Pancreatic cancer Neg Hx   ? Liver disease Neg Hx   ? ?Social History  ? ?Socioeconomic History  ? Marital status: Single  ?  Spouse name: Not on file  ? Number of children: 1  ? Years of education: Not on file  ? Highest education level: Not on file  ?Occupational History  ? Occupation: retired Pharmacist, hospital  ?  Employer: RETIRED  ?Tobacco Use  ? Smoking status: Former  ?  Packs/day: 0.30  ?  Years: 2.00  ?  Pack years: 0.60  ?  Types: Cigarettes  ?  Quit date: 07/11/1962  ?  Years since quitting: 59.6  ? Smokeless tobacco: Never  ?Vaping Use  ? Vaping Use: Never used  ?Substance and Sexual Activity  ? Alcohol use: Not Currently  ? Drug use: No  ? Sexual activity: Yes  ?  Birth control/protection: Surgical  ?  Comment: Hysterectomy  ?Other Topics Concern  ? Not on file  ?Social History Narrative  ?  Daily caffeine  ? ?Social Determinants of Health  ? ?Financial Resource Strain: Low Risk   ? Difficulty of Paying Living Expenses: Not hard at all  ?Food Insecurity: No Food Insecurity  ? Worried About Running

## 2022-02-21 NOTE — Progress Notes (Deleted)
?Cardiology Office Note:   ? ?Date:  02/21/2022  ? ?ID:  Stacy Ayers, DOB [birth year], MRN [id number] ? ?PCP:  [name], DO ?  ?[facility] Providers ?Cardiologist:  None { ? ?Referring MD: Carollee Herter, Alferd Apa, *  ? ?History of Present Illness:   ? ?Stacy Ayers is a 81 y.o. female with a hx of HTN, hypothyroidism, anxiety, asthma, depression, prior gastric bypass and palpitations who presents to clinic for follow-up. ? ?Was initially seen in 11/2021 for evaluation of palpitations. Two week zio placed which revealed 1200 episodes of SVT with longest episode lasting 8mn at HR 124. TTE 11/2021 showed LVEF 60-65%, normal RV, mild LAE, mild MR. We changed her metop to XL '50mg'$  daily and provided '25mg'$  tartrate TID for breakthrough.  ? ?Today, *** ? ?She denies any chest pain, or shortness of breath. No lightheadedness, headaches, orthopnea, PND, lower extremity edema or exertional symptoms. She has no known cardiac history. Family history only notable for a grandmother with angina. ? ? ?Past Medical History:  ?Diagnosis Date  ? Abnormal liver function tests   ? Allergy   ? Anxiety   ? Arthritis   ? knees, lower back   ? Asthma   ? Cancer ([facility]   ? Cataract   ? left eye growing cataract   ? Depression   ? Diabetes mellitus   ? had gastric bypass DM resolved-   ? Diverticulosis   ? GERD (gastroesophageal reflux disease)   ? Headache   ? Previous migraines  ? Heart murmur   ? History of colon polyps   ? adenomatous  ? Hyperlipidemia   ? past hx- resloved after gastric bypass   ? Hypertension   ? Hypothyroidism   ? IBS (irritable bowel syndrome)   ? Iron deficiency anemia   ? Osteoporosis   ? ? ?Past Surgical History:  ?Procedure Laterality Date  ? ABDOMINAL HYSTERECTOMY    ? ABDOMINAL HYSTERECTOMY    ? APPENDECTOMY    ? BLADDER REPAIR    ? BREAST BIOPSY Right   ? needle core biopsy, benign  ? CATARACT EXTRACTION Left   ? CHOLECYSTECTOMY    ? COLONOSCOPY    ? CORONARY ANGIOPLASTY    ? GASTRIC BYPASS    ?  POLYPECTOMY    ? REVERSE SHOULDER ARTHROPLASTY Right 07/15/2020  ? Procedure: REVERSE SHOULDER ARTHROPLASTY;  Surgeon: SJustice Britain MD;  Location: [facility];  Service: Orthopedics;  Laterality: Right;  1268m  ? TONSILLECTOMY AND ADENOIDECTOMY    ? 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  ? Procedure: TOTAL KNEE ARTHROPLASTY;  Surgeon: AlGaynelle ArabianMD;  Location: [facility];  Service: Orthopedics;  Laterality: Left;  ? ? ?Current Medications: ?No outpatient medications have been marked as taking for the 03/03/22 encounter (Appointment) with PeFreada BergeronMD.  ?  ? ?Allergies:   Sulfonamide derivatives, Cocaine, Montelukast sodium, Penicillins, Pioglitazone, Rosiglitazone maleate, Secobarbital sodium, Cefdinir, Codeine, and Doxycycline  ? ?Social History  ? ?Socioeconomic History  ? Marital status: Single  ?  Spouse name: Not on file  ? Number of children: 1  ? Years of education: Not on file  ? Highest education level: Not on file  ?Occupational History  ? Occupation: retired tePharmacist, hospital?  Employer: RETIRED  ?Tobacco Use  ? Smoking status: Former  ?  Packs/day: 0.30  ?  Years: 2.00  ?  Pack years: 0.60  ?  Types: Cigarettes  ?  Quit date: 07/11/1962  ?  Years since quitting: 59.6  ? Smokeless tobacco: Never  ?Vaping Use  ? Vaping Use: Never used  ?Substance and Sexual Activity  ? Alcohol use: Not Currently  ? Drug use: No  ? Sexual activity: Yes  ?  Birth control/protection: Surgical  ?  Comment: Hysterectomy  ?Other Topics Concern  ? Not on file  ?Social History Narrative  ? Daily caffeine  ? ?Social Determinants of Health  ? ?Financial Resource Strain: Low Risk   ? Difficulty of Paying Living Expenses: Not hard at all  ?Food Insecurity: No Food Insecurity  ? Worried About Charity fundraiser in the Last Year: Never true  ? Ran Out of Food in the Last Year: Never true  ?Transportation Needs: No Transportation Needs  ? Lack of Transportation (Medical): No  ? Lack of Transportation (Non-Medical): No  ?Physical  Activity: Inactive  ? Days of Exercise per Week: 0 days  ? Minutes of Exercise per Session: 0 min  ?Stress: No Stress Concern Present  ? Feeling of Stress : Not at all  ?Social Connections: Moderately Integrated  ? Frequency of Communication with Friends and Family: More than three times a week  ? Frequency of Social Gatherings with Friends and Family: More than three times a week  ? Attends Religious Services: More than 4 times per year  ? Active Member of Clubs or Organizations: Yes  ? Attends Archivist Meetings: More than 4 times per year  ? Marital Status: Divorced  ?  ? ?Family History: ?The patient's family history includes Angina in her maternal grandmother; Anuerysm in her mother; Asthma in her mother; Cancer in her paternal grandmother; Diabetes in her sister; Heart disease in her maternal grandmother; Obesity in her father; Stroke in her mother; Suicidality in her maternal grandfather. There is no history of Colon cancer, Colon polyps, Esophageal cancer, Rectal cancer, Stomach cancer, Pancreatic cancer, or Liver disease. ? ?ROS:   ?Review of Systems  ?Constitutional:  Negative for diaphoresis and malaise/fatigue.  ?HENT:  Negative for ear pain and sore throat.   ?Eyes:  Negative for photophobia and discharge.  ?Respiratory:  Negative for cough, hemoptysis and sputum production.   ?Cardiovascular:  Positive for palpitations. Negative for chest pain, orthopnea, claudication, leg swelling and PND.  ?Gastrointestinal:  Negative for blood in stool and constipation.  ?Genitourinary:  Negative for dysuria and hematuria.  ?Musculoskeletal:  Negative for falls and joint pain.  ?Skin:  Negative for itching.  ?Neurological:  Negative for dizziness, tingling and headaches.  ?Endo/Heme/Allergies:  Negative for environmental allergies.  ?Psychiatric/Behavioral:  Negative for depression and hallucinations. The patient does not have insomnia.   ? ? ?EKGs/Labs/Other Studies Reviewed:   ? ?The following studies  were reviewed today: ?TTE 11/2021: ?IMPRESSIONS  ? ? ? 1. Left ventricular ejection fraction, by estimation, is 60 to 65%. The  ?left ventricle has normal function. The left ventricle has no regional  ?wall motion abnormalities. Left ventricular diastolic parameters were  ?normal.  ? 2. Right ventricular systolic function is normal. The right ventricular  ?size is normal.  ? 3. Left atrial size was mildly dilated.  ? 4. The mitral valve is normal in structure. Mild mitral valve  ?regurgitation. No evidence of mitral stenosis.  ? 5. The aortic valve is normal in structure. Aortic valve regurgitation is  ?not visualized. No aortic stenosis is present.  ? 6. The inferior vena cava is normal in size with greater than 50%  ?respiratory variability, suggesting right atrial pressure  of 3 mmHg.  ? ?Cardiac Monitor 11/27/21: ?Patch Wear Time:  13 days and 16 hours (2022-12-09T19:31:54-0500 to 2022-12-23T11:38:56-0500) ?  ?Patient had a min HR of 51 bpm, max HR of 218 bpm, and avg HR of 72 bpm. Predominant underlying rhythm was Sinus Rhythm. 1201 Supraventricular Tachycardia runs occurred, the run with the fastest interval lasting 10 beats with a max rate of 218 bpm, the  ?longest lasting 45 mins 14 secs with an avg rate of 124 bpm. Isolated SVEs were rare (<1.0%), SVE Couplets were rare (<1.0%), and SVE Triplets were rare (<1.0%). Isolated VEs were rare (<1.0%), VE Couplets were rare (<1.0%), and no VE Triplets were  ?present.  ? ?LE Venous DVT 10/28/2021: ?Summary:  ?RIGHT:  ?- No evidence of common femoral vein obstruction.  ?   ?LEFT:  ?- There is no evidence of deep vein thrombosis in the lower extremity.  ?- There is no evidence of superficial venous thrombosis.  ?   ?- No cystic structure found in the popliteal fossa.  ?Subcutaneous irregularity noted by the sonographer in area of knee and  ?calf.  ? ?CTA chest 10/28/21: ?FINDINGS: ?CTA CHEST FINDINGS ?  ?Cardiovascular: Scattered aortic atherosclerosis. Normal caliber  of ?the thoracic aorta. Normal caliber of the main pulmonary arteries. ?Main pulmonary artery is moderately well opacified at [address] ?units. There is some motion at the lung bases which does limit ?a

## 2022-03-01 DIAGNOSIS — D1801 Hemangioma of skin and subcutaneous tissue: Secondary | ICD-10-CM | POA: Diagnosis not present

## 2022-03-01 DIAGNOSIS — L821 Other seborrheic keratosis: Secondary | ICD-10-CM | POA: Diagnosis not present

## 2022-03-01 DIAGNOSIS — L814 Other melanin hyperpigmentation: Secondary | ICD-10-CM | POA: Diagnosis not present

## 2022-03-01 DIAGNOSIS — D220 Melanocytic nevi of lip: Secondary | ICD-10-CM | POA: Diagnosis not present

## 2022-03-01 DIAGNOSIS — L570 Actinic keratosis: Secondary | ICD-10-CM | POA: Diagnosis not present

## 2022-03-01 DIAGNOSIS — D692 Other nonthrombocytopenic purpura: Secondary | ICD-10-CM | POA: Diagnosis not present

## 2022-03-03 ENCOUNTER — Ambulatory Visit: Payer: Medicare PPO | Admitting: Cardiology

## 2022-03-03 ENCOUNTER — Encounter: Payer: Self-pay | Admitting: Cardiology

## 2022-03-03 VITALS — BP 128/74 | HR 66 | Ht 67.0 in | Wt 162.4 lb

## 2022-03-03 DIAGNOSIS — I491 Atrial premature depolarization: Secondary | ICD-10-CM

## 2022-03-03 DIAGNOSIS — R002 Palpitations: Secondary | ICD-10-CM | POA: Diagnosis not present

## 2022-03-03 DIAGNOSIS — I471 Supraventricular tachycardia: Secondary | ICD-10-CM

## 2022-03-03 DIAGNOSIS — I700 Atherosclerosis of aorta: Secondary | ICD-10-CM | POA: Diagnosis not present

## 2022-03-03 DIAGNOSIS — I10 Essential (primary) hypertension: Secondary | ICD-10-CM | POA: Diagnosis not present

## 2022-03-03 MED ORDER — METOPROLOL SUCCINATE ER 50 MG PO TB24: ORAL_TABLET | ORAL | 2 refills | Status: DC

## 2022-03-03 NOTE — Patient Instructions (Signed)
Medication Instructions:  ? ?TAKE METOPROLOL SUCCINATE 25 MG BY MOUTH IN THE AM, THEN TAKE 50 MG BY MOUTH IN THE PM EVERYDAY ? ?*If you need a refill on your cardiac medications before your next appointment, please call your pharmacy* ? ? ? ?Follow-Up: ?At [facility], you and your health needs are our priority.  As part of our continuing mission to provide you with exceptional heart care, we have created designated Provider Care Teams.  These Care Teams include your primary Cardiologist (physician) and Advanced Practice Providers (APPs -  Physician Assistants and Nurse Practitioners) who all work together to provide you with the care you need, when you need it. ? ?We recommend signing up for the patient portal called "MyChart".  Sign up information is provided on this After Visit Summary.  MyChart is used to connect with patients for Virtual Visits (Telemedicine).  Patients are able to view lab/test results, encounter notes, upcoming appointments, etc.  Non-urgent messages can be sent to your provider as well.   ?To learn more about what you can do with MyChart, go to [email or web address].   ? ?Your next appointment:   ?6 month(s) ? ?The format for your next appointment:   ?In Person ? ?Provider:   ?DR. PEMBERTON  ? ?

## 2022-03-03 NOTE — Progress Notes (Signed)
?Cardiology Office Note:   ? ?Date:  03/03/2022  ? ?ID:  Stacy Ayers, DOB May 29, 1941, MRN [id number] ? ?PCP:  [name], DO ?  ?[facility] Providers ?Cardiologist:  None { ? ?Referring MD: Carollee Herter, Alferd Apa, *  ? ?History of Present Illness:   ? ?Stacy Ayers is a 81 y.o. female with a hx of HTN, hypothyroidism, anxiety, asthma, depression, prior gastric bypass and palpitations who presents to clinic for follow-up. ? ?Was initially seen in 11/2021 for evaluation of palpitations. Two week Zio placed which revealed 1200 episodes of SVT with longest episode lasting 50mn at HR 124. TTE 11/2021 showed LVEF 60-65%, normal RV, mild LAE, mild MR. We changed her metop to XL '50mg'$  daily and provided '25mg'$  tartrate TID for breakthrough.  ? ?Today, she continues to report palpitations which primarily occur at night. The metoprolol improves the palpitations. She denies any chest pain, or shortness of breath, syncope, orthopnea, PND, lower extremity edema or exertional symptoms.  ? ?She was infected with COVID several months ago. She continues to feel constantly fatigued and sleeping more. However, her fatigue has been progressively improving.  ? ?She also endorses dizziness that occurs when she changes positions or walks. She did have a single episode of bradycardia.  ? ?Past Medical History:  ?Diagnosis Date  ? Abnormal liver function tests   ? Allergy   ? Anxiety   ? Arthritis   ? knees, lower back   ? Asthma   ? Cancer ([facility]   ? Cataract   ? left eye growing cataract   ? Depression   ? Diabetes mellitus   ? had gastric bypass DM resolved-   ? Diverticulosis   ? GERD (gastroesophageal reflux disease)   ? Headache   ? Previous migraines  ? Heart murmur   ? History of colon polyps   ? adenomatous  ? Hyperlipidemia   ? past hx- resloved after gastric bypass   ? Hypertension   ? Hypothyroidism   ? IBS (irritable bowel syndrome)   ? Iron deficiency anemia   ? Osteoporosis   ? ? ?Past Surgical History:  ?Procedure  Laterality Date  ? ABDOMINAL HYSTERECTOMY    ? ABDOMINAL HYSTERECTOMY    ? APPENDECTOMY    ? BLADDER REPAIR    ? BREAST BIOPSY Right   ? needle core biopsy, benign  ? CATARACT EXTRACTION Left   ? CHOLECYSTECTOMY    ? COLONOSCOPY    ? CORONARY ANGIOPLASTY    ? GASTRIC BYPASS    ? POLYPECTOMY    ? REVERSE SHOULDER ARTHROPLASTY Right 07/15/2020  ? Procedure: REVERSE SHOULDER ARTHROPLASTY;  Surgeon: SJustice Britain MD;  Location: [facility];  Service: Orthopedics;  Laterality: Right;  1257m  ? TONSILLECTOMY AND ADENOIDECTOMY    ? 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  ? Procedure: TOTAL KNEE ARTHROPLASTY;  Surgeon: AlGaynelle ArabianMD;  Location: [facility];  Service: Orthopedics;  Laterality: Left;  ? ? ?Current Medications: ?Current Meds  ?Medication Sig  ? acetaminophen (TYLENOL) 650 MG CR tablet Take 650 mg by mouth every 8 (eight) hours as needed for pain.  ? albuterol (PROVENTIL HFA) 108 (90 Base) MCG/ACT inhaler Inhale 2 puffs into the lungs every 6 (six) hours as needed.  ? COVID-19 mRNA bivalent vaccine, Moderna, (MODERNA COVID-19 BIVAL BOOSTER) 50 MCG/0.5ML injection Inject into the muscle.  ? denosumab (PROLIA) 60 MG/ML SOSY injection Inject 60 mg into the skin every 6 (six) months. Deliver to [address]  [address], [place] [address]  ? diclofenac Sodium (VOLTAREN) 1 % GEL Apply 2 g topically 4 (four) times daily. Do not apply to open area, apply to knee pain area  ? docusate sodium (COLACE) 100 MG capsule Take 100 mg by mouth 2 (two) times daily as needed for mild constipation.  ? ferrous sulfate 325 (65 FE) MG tablet Take 325 mg by mouth daily with breakfast.  ? guaiFENesin (MUCINEX) 600 MG 12 hr tablet Take 2 tablets (1,200 mg total) by mouth 2 (two) times daily.  ? losartan (COZAAR) 50 MG tablet Take 1 tablet (50 mg total) by mouth daily.  ? methocarbamol (ROBAXIN) 500 MG tablet Take 1 tablet (500 mg total) by mouth every 6 (six) hours as needed for muscle spasms.  ? metoprolol succinate (TOPROL-XL) 50 MG  24 hr tablet Take 1/2 tablet (25 mg total) by mouth in the AM, then take 1 tablet (50 mg total) by mouth in the PM, everyday. Take with or immediately following a meal.  ? metoprolol tartrate (LOPRESSOR) 25 MG tablet Take 1 tablet (25 mg total) by mouth 3 (three) times daily as needed (palpitations).  ? pantoprazole (PROTONIX) 40 MG tablet TAKE 1 TABLET ONCE DAILY.  ? SYNTHROID 137 MCG tablet TAKE 1 TABLET ONCE DAILY ON MONDAY, WEDNESDAY, AND FRIDAY. TAKE 150MCG DAILY ALL OTHER DAYS.  ? SYNTHROID 150 MCG tablet Take 1 tablet on Sun, Tues, Thurs, and Sat.  ? venlafaxine XR (EFFEXOR-XR) 75 MG 24 hr capsule TAKE 1 CAPSULE ONCE DAILY WITH BREAKFAST.  ? [DISCONTINUED] metoprolol succinate (TOPROL-XL) 50 MG 24 hr tablet Take 1 tablet (50 mg total) by mouth in the AM, then take 1/2 tablet (25 mg total) by mouth in the PM, everyday. Take with or immediately following a meal.  ?  ? ?Allergies:   Sulfonamide derivatives, Cocaine, Montelukast sodium, Penicillins, Pioglitazone, Rosiglitazone maleate, Secobarbital sodium, Cefdinir, Codeine, and Doxycycline  ? ?Social History  ? ?Socioeconomic History  ? Marital status: Single  ?  Spouse name: Not on file  ? Number of children: 1  ? Years of education: Not on file  ? Highest education level: Not on file  ?Occupational History  ? Occupation: retired Pharmacist, hospital  ?  Employer: RETIRED  ?Tobacco Use  ? Smoking status: Former  ?  Packs/day: 0.30  ?  Years: 2.00  ?  Pack years: 0.60  ?  Types: Cigarettes  ?  Quit date: 07/11/1962  ?  Years since quitting: 59.6  ? Smokeless tobacco: Never  ?Vaping Use  ? Vaping Use: Never used  ?Substance and Sexual Activity  ? Alcohol use: Not Currently  ? Drug use: No  ? Sexual activity: Yes  ?  Birth control/protection: Surgical  ?  Comment: Hysterectomy  ?Other Topics Concern  ? Not on file  ?Social History Narrative  ? Daily caffeine  ? ?Social Determinants of Health  ? ?Financial Resource Strain: Low Risk   ? Difficulty of Paying Living Expenses: Not  hard at all  ?Food Insecurity: No Food Insecurity  ? Worried About Charity fundraiser in the Last Year: Never true  ? Ran Out of Food in the Last Year: Never true  ?Transportation Needs: No Transportation Needs  ? Lack of Transportation (Medical): No  ? Lack of Transportation (Non-Medical): No  ?Physical Activity: Inactive  ? Days of Exercise per Week: 0 days  ? Minutes of Exercise per Session: 0 min  ?Stress: No Stress Concern Present  ? Feeling of Stress :  Not at all  ?Social Connections: Moderately Integrated  ? Frequency of Communication with Friends and Family: More than three times a week  ? Frequency of Social Gatherings with Friends and Family: More than three times a week  ? Attends Religious Services: More than 4 times per year  ? Active Member of Clubs or Organizations: Yes  ? Attends Archivist Meetings: More than 4 times per year  ? Marital Status: Divorced  ?  ? ?Family History: ?The patient's family history includes Angina in her maternal grandmother; Anuerysm in her mother; Asthma in her mother; Cancer in her paternal grandmother; Diabetes in her sister; Heart disease in her maternal grandmother; Obesity in her father; Stroke in her mother; Suicidality in her maternal grandfather. There is no history of Colon cancer, Colon polyps, Esophageal cancer, Rectal cancer, Stomach cancer, Pancreatic cancer, or Liver disease. ? ?ROS:   ?Review of Systems  ?Constitutional:  Positive for malaise/fatigue. Negative for diaphoresis.  ?HENT:  Negative for ear pain and sore throat.   ?Eyes:  Negative for photophobia and discharge.  ?Respiratory:  Negative for cough, hemoptysis and sputum production.   ?Cardiovascular:  Positive for palpitations. Negative for chest pain, orthopnea, claudication, leg swelling and PND.  ?Gastrointestinal:  Negative for blood in stool and constipation.  ?Genitourinary:  Negative for dysuria and hematuria.  ?Musculoskeletal:  Negative for falls and joint pain.  ?Skin:  Negative  for itching.  ?Neurological:  Positive for dizziness (Positional). Negative for tingling and headaches.  ?Endo/Heme/Allergies:  Negative for environmental allergies.  ?Psychiatric/Behavioral:  Negat

## 2022-03-09 NOTE — Progress Notes (Deleted)
?Electrophysiology Office Note:   ? ?Date:  03/09/2022  ? ?ID:  Stacy Ayers, DOB 1941-11-10, MRN [id number] ? ?PCP:  [name], DO  ?[facility] Cardiologist:  None  ?[facility] Electrophysiologist:  Vickie Epley, MD  ? ?Referring MD: Carollee Herter, Alferd Apa, *  ? ?Chief Complaint: SVT ? ?History of Present Illness:   ? ?Stacy Ayers is a 81 y.o. female who presents for an evaluation of SVT at the request of Dr. Johney Frame. Their medical history includes hypothyroidism, hypertension, anxiety, asthma, depression, gastric bypass.  The patient was seen by Dr. Johney Frame March 03, 2022 for palpitations.  This was a follow-up appointment from an initial appointment January 2023 for the same.  She wore a 2-week ZIO monitor which showed 1200 episodes of SVT with the longest episode lasting 45 minutes with a heart rate of 124 bpm. ? ? ?  ?Past Medical History:  ?Diagnosis Date  ? Abnormal liver function tests   ? Allergy   ? Anxiety   ? Arthritis   ? knees, lower back   ? Asthma   ? Cancer [facility])   ? Cataract   ? left eye growing cataract   ? Depression   ? Diabetes mellitus   ? had gastric bypass DM resolved-   ? Diverticulosis   ? GERD (gastroesophageal reflux disease)   ? Headache   ? Previous migraines  ? Heart murmur   ? History of colon polyps   ? adenomatous  ? Hyperlipidemia   ? past hx- resloved after gastric bypass   ? Hypertension   ? Hypothyroidism   ? IBS (irritable bowel syndrome)   ? Iron deficiency anemia   ? Osteoporosis   ? ? ?Past Surgical History:  ?Procedure Laterality Date  ? ABDOMINAL HYSTERECTOMY    ? ABDOMINAL HYSTERECTOMY    ? APPENDECTOMY    ? BLADDER REPAIR    ? BREAST BIOPSY Right   ? needle core biopsy, benign  ? CATARACT EXTRACTION Left   ? CHOLECYSTECTOMY    ? COLONOSCOPY    ? CORONARY ANGIOPLASTY    ? GASTRIC BYPASS    ? POLYPECTOMY    ? REVERSE SHOULDER ARTHROPLASTY Right 07/15/2020  ? Procedure: REVERSE SHOULDER ARTHROPLASTY;  Surgeon: Justice Britain, MD;  Location: [facility]  ORS;  Service: Orthopedics;  Laterality: Right;  147mn  ? TONSILLECTOMY AND ADENOIDECTOMY    ? 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  ? Procedure: TOTAL KNEE ARTHROPLASTY;  Surgeon: AGaynelle Arabian MD;  Location: [facility] ORS;  Service: Orthopedics;  Laterality: Left;  ? ? ?Current Medications: ?No outpatient medications have been marked as taking for the 03/10/22 encounter (Appointment) with LVickie Epley MD.  ?  ? ?Allergies:   Sulfonamide derivatives, Cocaine, Montelukast sodium, Penicillins, Pioglitazone, Rosiglitazone maleate, Secobarbital sodium, Cefdinir, Codeine, and Doxycycline  ? ?Social History  ? ?Socioeconomic History  ? Marital status: Single  ?  Spouse name: Not on file  ? Number of children: 1  ? Years of education: Not on file  ? Highest education level: Not on file  ?Occupational History  ? Occupation: retired tPharmacist, hospital ?  Employer: RETIRED  ?Tobacco Use  ? Smoking status: Former  ?  Packs/day: 0.30  ?  Years: 2.00  ?  Pack years: 0.60  ?  Types: Cigarettes  ?  Quit date: 07/11/1962  ?  Years since quitting: 59.7  ? Smokeless tobacco: Never  ?Vaping Use  ? Vaping Use: Never used  ?Substance and  Sexual Activity  ? Alcohol use: Not Currently  ? Drug use: No  ? Sexual activity: Yes  ?  Birth control/protection: Surgical  ?  Comment: Hysterectomy  ?Other Topics Concern  ? Not on file  ?Social History Narrative  ? Daily caffeine  ? ?Social Determinants of Health  ? ?Financial Resource Strain: Low Risk   ? Difficulty of Paying Living Expenses: Not hard at all  ?Food Insecurity: No Food Insecurity  ? Worried About Charity fundraiser in the Last Year: Never true  ? Ran Out of Food in the Last Year: Never true  ?Transportation Needs: No Transportation Needs  ? Lack of Transportation (Medical): No  ? Lack of Transportation (Non-Medical): No  ?Physical Activity: Inactive  ? Days of Exercise per Week: 0 days  ? Minutes of Exercise per Session: 0 min  ?Stress: No Stress Concern Present  ? Feeling of Stress :  Not at all  ?Social Connections: Moderately Integrated  ? Frequency of Communication with Friends and Family: More than three times a week  ? Frequency of Social Gatherings with Friends and Family: More than three times a week  ? Attends Religious Services: More than 4 times per year  ? Active Member of Clubs or Organizations: Yes  ? Attends Archivist Meetings: More than 4 times per year  ? Marital Status: Divorced  ?  ? ?Family History: ?The patient's family history includes Angina in her maternal grandmother; Anuerysm in her mother; Asthma in her mother; Cancer in her paternal grandmother; Diabetes in her sister; Heart disease in her maternal grandmother; Obesity in her father; Stroke in her mother; Suicidality in her maternal grandfather. There is no history of Colon cancer, Colon polyps, Esophageal cancer, Rectal cancer, Stomach cancer, Pancreatic cancer, or Liver disease. ? ?ROS:   ?Please see the history of present illness.    ?All other systems reviewed and are negative. ? ?EKGs/Labs/Other Studies Reviewed:   ? ?The following studies were reviewed today: ? ?January 2023 echo ?Left ventricular function normal, 60% ?Right ventricular function normal ?Mildly dilated left atrium ?Mild MR ? ? ?December 08, 2021 ZIO monitor ?HR of 51 bpm, max HR of 218 bpm, and avg HR of 72 bpm. Predominant underlying rhythm was Sinus Rhythm. 1201 Supraventricular Tachycardia runs occurred, the run with the fastest interval lasting 10 beats with a max rate of 218 bpm, the  ?longest lasting 45 mins 14 secs with an avg rate of 124 bpm. Isolated SVEs were rare (<1.0%), SVE Couplets were rare (<1.0%), and SVE Triplets were rare (<1.0%). Isolated VEs were rare (<1.0%), VE Couplets were rare (<1.0%), and no VE Triplets were  ?present.  ? ? ? ? ?EKG:  The ekg ordered today demonstrates *** ? ? ?Recent Labs: ?10/31/2021: TSH 2.001 ?11/02/2021: Magnesium 2.4 ?11/17/2021: Hemoglobin 10.0; Platelets 231.0 ?01/25/2022: ALT 9; BUN 11;  Creat 0.63; Potassium 4.4; Sodium 141  ?Recent Lipid Panel ?   ?Component Value Date/Time  ? CHOL 169 08/17/2021 0000  ? TRIG 127 08/17/2021 0000  ? HDL 53 08/17/2021 0000  ? CHOLHDL 4 11/06/2020 1011  ? VLDL 27.8 11/06/2020 1011  ? [place] 94 08/17/2021 0000  ? LDLDIRECT 147.0 05/28/2019 1522  ? ? ?Physical Exam:   ? ?VS:  There were no vitals taken for this visit.   ? ?Wt Readings from Last 3 Encounters:  ?03/03/22 162 lb 6.4 oz (73.7 kg)  ?02/15/22 164 lb (74.4 kg)  ?01/25/22 164 lb (74.4 kg)  ?  ? ?  GEN: *** Well nourished, well developed in no acute distress ?HEENT: Normal ?NECK: No JVD; No carotid bruits ?LYMPHATICS: No lymphadenopathy ?CARDIAC: ***RRR, no murmurs, rubs, gallops ?RESPIRATORY:  Clear to auscultation without rales, wheezing or rhonchi  ?ABDOMEN: Soft, non-tender, non-distended ?MUSCULOSKELETAL:  No edema; No deformity  ?SKIN: Warm and dry ?NEUROLOGIC:  Alert and oriented x 3 ?PSYCHIATRIC:  Normal affect  ? ? ?  ? ?ASSESSMENT:   ? ?No diagnosis found. ?PLAN:   ? ?In order of problems listed above: ? ?Stop metoprolol start Rythmol ? ? ? ? ? ?Total time spent with patient today *** minutes. This includes reviewing records, evaluating the patient and coordinating care. ? ?Medication Adjustments/Labs and Tests Ordered: ?Current medicines are reviewed at length with the patient today.  Concerns regarding medicines are outlined above.  ?No orders of the defined types were placed in this encounter. ? ?No orders of the defined types were placed in this encounter. ? ? ? ?Signed, ?Lysbeth Galas T. Quentin Ore, MD, [facility], [place] ?03/09/2022 10:30 PM    ?Electrophysiology ?[place] ?

## 2022-03-10 ENCOUNTER — Encounter: Payer: Self-pay | Admitting: Cardiology

## 2022-03-10 ENCOUNTER — Ambulatory Visit: Payer: Medicare PPO | Admitting: Cardiology

## 2022-03-10 VITALS — BP 110/60 | HR 85 | Ht 67.0 in | Wt 163.2 lb

## 2022-03-10 DIAGNOSIS — R002 Palpitations: Secondary | ICD-10-CM

## 2022-03-10 DIAGNOSIS — I10 Essential (primary) hypertension: Secondary | ICD-10-CM | POA: Diagnosis not present

## 2022-03-10 DIAGNOSIS — I491 Atrial premature depolarization: Secondary | ICD-10-CM

## 2022-03-10 DIAGNOSIS — I471 Supraventricular tachycardia: Secondary | ICD-10-CM

## 2022-03-10 MED ORDER — PROPAFENONE HCL ER 225 MG PO CP12: 225.0000 mg | ORAL_CAPSULE | Freq: Two times a day (BID) | ORAL | 3 refills | Status: DC

## 2022-03-10 NOTE — Patient Instructions (Addendum)
Medication Instructions:  ?Start Propafenone 225 mg two times a day ?Stop Metoprolol succinate  ?Your physician recommends that you continue on your current medications as directed. Please refer to the Current Medication list given to you today. ?*If you need a refill on your cardiac medications before your next appointment, please call your pharmacy* ? ?Lab Work: ?None. ?If you have labs (blood work) drawn today and your tests are completely normal, you will receive your results only by: ?MyChart Message (if you have MyChart) OR ?A paper copy in the mail ?If you have any lab test that is abnormal or we need to change your treatment, we will call you to review the results. ? ?Testing/Procedures: ?None. ? ?Follow-Up: ?At [facility], you and your health needs are our priority.  As part of our continuing mission to provide you with exceptional heart care, we have created designated Provider Care Teams.  These Care Teams include your primary Cardiologist (physician) and Advanced Practice Providers (APPs -  Physician Assistants and Nurse Practitioners) who all work together to provide you with the care you need, when you need it. ? ?Your physician wants you to follow-up in: 10 day EKG nurse visit and 3 months with one of the following Advanced Practice Providers on your designated Care Team:   ? ?Tommye Standard, PA-C ?Legrand Como "Jonni Sanger" Ettrick, PA-C ? ? ?We recommend signing up for the patient portal called "MyChart".  Sign up information is provided on this After Visit Summary.  MyChart is used to connect with patients for Virtual Visits (Telemedicine).  Patients are able to view lab/test results, encounter notes, upcoming appointments, etc.  Non-urgent messages can be sent to your provider as well.   ?To learn more about what you can do with MyChart, go to [email or web address].   ? ?Any Other Special Instructions Will Be Listed Below (If Applicable). ? ? ? ? ?  ? ? ?

## 2022-03-10 NOTE — Progress Notes (Signed)
?Electrophysiology Office Note:   ? ?Date:  03/10/2022  ? ?ID:  Stacy Ayers, DOB 05-31-1941, MRN [id number] ? ?PCP:  [name], DO  ?[facility] Cardiologist:  None  ?[facility] Electrophysiologist:  Vickie Epley, MD  ? ?Referring MD: Carollee Herter, Alferd Apa, *  ? ?Chief Complaint: SVT ? ?History of Present Illness:   ? ?Stacy Ayers is a 81 y.o. female who presents for an evaluation of SVT at the request of Dr. Johney Frame. Their medical history includes hypothyroidism, hypertension, anxiety, asthma, depression, gastric bypass.  The patient was seen by Dr. Johney Frame March 03, 2022 for palpitations.  This was a follow-up appointment from an initial appointment January 2023 for the same.  She wore a 2-week ZIO monitor which showed 1200 episodes of SVT with the longest episode lasting 45 minutes with a heart rate of 124 bpm. ? ?Today, she appears well. She affirms feeling palpitations constantly, worse since her previous left total knee arthroplasty in 09/2021. However, she has had intermittent palpitations since she was in college. ? ?During an episode of palpitations, she feels like "she hopes there is a next heartbeat." She describes this as a pause with a flutter. Her palpitations are most noticeable at night. Also she enjoys singing in church, and will sometimes feel them while singing. ? ?Since she has been on medication for her palpitations, she is feeling extremely exhausted and a little more lightheaded as well. ? ?She presents a BP log with generally well controlled readings lately. ? ?She denies any chest pain, shortness of breath, or peripheral edema. No headaches, syncope, orthopnea, or PND. ? ? ? ?  ?Past Medical History:  ?Diagnosis Date  ? Abnormal liver function tests   ? Allergy   ? Anxiety   ? Arthritis   ? knees, lower back   ? Asthma   ? Cancer [facility])   ? Cataract   ? left eye growing cataract   ? Depression   ? Diabetes mellitus   ? had gastric bypass DM resolved-   ?  Diverticulosis   ? GERD (gastroesophageal reflux disease)   ? Headache   ? Previous migraines  ? Heart murmur   ? History of colon polyps   ? adenomatous  ? Hyperlipidemia   ? past hx- resloved after gastric bypass   ? Hypertension   ? Hypothyroidism   ? IBS (irritable bowel syndrome)   ? Iron deficiency anemia   ? Osteoporosis   ? ? ?Past Surgical History:  ?Procedure Laterality Date  ? ABDOMINAL HYSTERECTOMY    ? ABDOMINAL HYSTERECTOMY    ? APPENDECTOMY    ? BLADDER REPAIR    ? BREAST BIOPSY Right   ? needle core biopsy, benign  ? CATARACT EXTRACTION Left   ? CHOLECYSTECTOMY    ? COLONOSCOPY    ? CORONARY ANGIOPLASTY    ? GASTRIC BYPASS    ? POLYPECTOMY    ? REVERSE SHOULDER ARTHROPLASTY Right 07/15/2020  ? Procedure: REVERSE SHOULDER ARTHROPLASTY;  Surgeon: Justice Britain, MD;  Location: [facility] ORS;  Service: Orthopedics;  Laterality: Right;  145mn  ? TONSILLECTOMY AND ADENOIDECTOMY    ? 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  ? Procedure: TOTAL KNEE ARTHROPLASTY;  Surgeon: AGaynelle Arabian MD;  Location: [facility] ORS;  Service: Orthopedics;  Laterality: Left;  ? ? ?Current Medications: ?Current Meds  ?Medication Sig  ? acetaminophen (TYLENOL) 650 MG CR tablet Take 650 mg by mouth every 8 (eight) hours as needed for pain.  ?  albuterol (PROVENTIL HFA) 108 (90 Base) MCG/ACT inhaler Inhale 2 puffs into the lungs every 6 (six) hours as needed.  ? denosumab (PROLIA) 60 MG/ML SOSY injection Inject 60 mg into the skin every 6 (six) months. Deliver to [address], [place] [address]  ? diclofenac Sodium (VOLTAREN) 1 % GEL Apply 2 g topically 4 (four) times daily. Do not apply to open area, apply to knee pain area  ? ferrous sulfate 325 (65 FE) MG tablet Take 325 mg by mouth daily with breakfast.  ? guaiFENesin (MUCINEX) 600 MG 12 hr tablet Take 2 tablets (1,200 mg total) by mouth 2 (two) times daily.  ? losartan (COZAAR) 50 MG tablet Take 1 tablet (50 mg total) by mouth daily.  ? metoprolol succinate (TOPROL-XL) 50  MG 24 hr tablet Take 1/2 tablet (25 mg total) by mouth in the AM, then take 1 tablet (50 mg total) by mouth in the PM, everyday. Take with or immediately following a meal.  ? metoprolol tartrate (LOPRESSOR) 25 MG tablet Take 1 tablet (25 mg total) by mouth 3 (three) times daily as needed (palpitations).  ? pantoprazole (PROTONIX) 40 MG tablet TAKE 1 TABLET ONCE DAILY.  ? SYNTHROID 137 MCG tablet TAKE 1 TABLET ONCE DAILY ON MONDAY, WEDNESDAY, AND FRIDAY. TAKE 150MCG DAILY ALL OTHER DAYS.  ? SYNTHROID 150 MCG tablet Take 1 tablet on Sun, Tues, Thurs, and Sat.  ? venlafaxine XR (EFFEXOR-XR) 75 MG 24 hr capsule TAKE 1 CAPSULE ONCE DAILY WITH BREAKFAST.  ?  ? ?Allergies:   Sulfonamide derivatives, Cocaine, Montelukast sodium, Penicillins, Pioglitazone, Rosiglitazone maleate, Secobarbital sodium, Cefdinir, Codeine, and Doxycycline  ? ?Social History  ? ?Socioeconomic History  ? Marital status: Single  ?  Spouse name: Not on file  ? Number of children: 1  ? Years of education: Not on file  ? Highest education level: Not on file  ?Occupational History  ? Occupation: retired Pharmacist, hospital  ?  Employer: RETIRED  ?Tobacco Use  ? Smoking status: Former  ?  Packs/day: 0.30  ?  Years: 2.00  ?  Pack years: 0.60  ?  Types: Cigarettes  ?  Quit date: 07/11/1962  ?  Years since quitting: 59.7  ? Smokeless tobacco: Never  ?Vaping Use  ? Vaping Use: Never used  ?Substance and Sexual Activity  ? Alcohol use: Not Currently  ? Drug use: No  ? Sexual activity: Yes  ?  Birth control/protection: Surgical  ?  Comment: Hysterectomy  ?Other Topics Concern  ? Not on file  ?Social History Narrative  ? Daily caffeine  ? ?Social Determinants of Health  ? ?Financial Resource Strain: Low Risk   ? Difficulty of Paying Living Expenses: Not hard at all  ?Food Insecurity: No Food Insecurity  ? Worried About Charity fundraiser in the Last Year: Never true  ? Ran Out of Food in the Last Year: Never true  ?Transportation Needs: No Transportation Needs  ? Lack of  Transportation (Medical): No  ? Lack of Transportation (Non-Medical): No  ?Physical Activity: Inactive  ? Days of Exercise per Week: 0 days  ? Minutes of Exercise per Session: 0 min  ?Stress: No Stress Concern Present  ? Feeling of Stress : Not at all  ?Social Connections: Moderately Integrated  ? Frequency of Communication with Friends and Family: More than three times a week  ? Frequency of Social Gatherings with Friends and Family: More than three times a week  ? Attends Religious Services: More  than 4 times per year  ? Active Member of Clubs or Organizations: Yes  ? Attends Archivist Meetings: More than 4 times per year  ? Marital Status: Divorced  ?  ? ?Family History: ?The patient's family history includes Angina in her maternal grandmother; Anuerysm in her mother; Asthma in her mother; Cancer in her paternal grandmother; Diabetes in her sister; Heart disease in her maternal grandmother; Obesity in her father; Stroke in her mother; Suicidality in her maternal grandfather. There is no history of Colon cancer, Colon polyps, Esophageal cancer, Rectal cancer, Stomach cancer, Pancreatic cancer, or Liver disease. ? ?ROS:   ?Please see the history of present illness.    ?(+) Palpitations ?(+) Fatigue ?(+) Lightheadedness ?All other systems reviewed and are negative. ? ?EKGs/Labs/Other Studies Reviewed:   ? ?The following studies were reviewed today: ? ?January 2023 echo ?Left ventricular function normal, 60% ?Right ventricular function normal ?Mildly dilated left atrium ?Mild MR ? ?December 08, 2021 ZIO monitor ?HR of 51 bpm, max HR of 218 bpm, and avg HR of 72 bpm. Predominant underlying rhythm was Sinus Rhythm. 1201 Supraventricular Tachycardia runs occurred, the run with the fastest interval lasting 10 beats with a max rate of 218 bpm, the  ?longest lasting 45 mins 14 secs with an avg rate of 124 bpm. Isolated SVEs were rare (<1.0%), SVE Couplets were rare (<1.0%), and SVE Triplets were rare (<1.0%).  Isolated VEs were rare (<1.0%), VE Couplets were rare (<1.0%), and no VE Triplets were  ?present.  ? ? ?EKG:  EKG is personally reviewed. ? ?Recent Labs: ?10/31/2021: TSH 2.001 ?11/02/2021: Magnesium 2.4 ?12/20/202

## 2022-03-22 ENCOUNTER — Ambulatory Visit: Payer: Medicare PPO | Admitting: *Deleted

## 2022-03-22 VITALS — HR 103 | Ht 67.0 in | Wt 164.0 lb

## 2022-03-22 DIAGNOSIS — I471 Supraventricular tachycardia: Secondary | ICD-10-CM

## 2022-03-22 NOTE — Patient Instructions (Addendum)
Medication Instructions:  ?Your physician recommends that you continue on your current medications as directed. Please refer to the Current Medication list given to you today. ?*If you need a refill on your cardiac medications before your next appointment, please call your pharmacy* ? ?Lab Work: ?None. ?If you have labs (blood work) drawn today and your tests are completely normal, you will receive your results only by: ?MyChart Message (if you have MyChart) OR ?A paper copy in the mail ?If you have any lab test that is abnormal or we need to change your treatment, we will call you to review the results. ? ?Testing/Procedures: ?None. ? ?Follow-Up: ?At [facility], you and your health needs are our priority.  As part of our continuing mission to provide you with exceptional heart care, we have created designated Provider Care Teams.  These Care Teams include your primary Cardiologist (physician) and Advanced Practice Providers (APPs -  Physician Assistants and Nurse Practitioners) who all work together to provide you with the care you need, when you need it. ? ?Your physician wants you to follow-up in: 06/08/22 ? ?Tommye Standard, PA-C ? ?We recommend signing up for the patient portal called "MyChart".  Sign up information is provided on this After Visit Summary.  MyChart is used to connect with patients for Virtual Visits (Telemedicine).  Patients are able to view lab/test results, encounter notes, upcoming appointments, etc.  Non-urgent messages can be sent to your provider as well.   ?To learn more about what you can do with MyChart, go to [email or web address].   ? ?Any Other Special Instructions Will Be Listed Below (If Applicable). ? ? ? ? ?  ? ?

## 2022-03-22 NOTE — Progress Notes (Signed)
? ?  Nurse Visit  ? ?Date of Encounter: 03/22/2022 ?ID: Stacy Ayers, DOB 1941/11/06, MRN [id number] ? ?PCP:  [name], DO ?  ?[facility] Providers ?Cardiologist:  None ?Electrophysiologist:  Vickie Epley, MD    ? ? ?Visit Details  ? ?VS:  There were no vitals taken for this visit. , BMI There is no height or weight on file to calculate BMI. ? ?Wt Readings from Last 3 Encounters:  ?03/10/22 163 lb 3.2 oz (74 kg)  ?03/03/22 162 lb 6.4 oz (73.7 kg)  ?02/15/22 164 lb (74.4 kg)  ?  ? ?Reason for visit: EKG post Propafenone start ?Performed today: EKG, Provider consulted:Dr. Angelena Form, and Education ?Changes (medications, testing, etc.) : no changes. ?Length of Visit: 20 minutes ? ?The patient feels about the same since medication changes took place.  ? ? ?Medications Adjustments/Labs and Tests Ordered: ?No orders of the defined types were placed in this encounter. ? ?No orders of the defined types were placed in this encounter. ? ? ? ?Signed, ?Darrell Jewel, RN  ?03/22/2022 2:12 PM  ?

## 2022-03-26 ENCOUNTER — Ambulatory Visit: Payer: Medicare PPO | Admitting: Family Medicine

## 2022-03-26 ENCOUNTER — Encounter: Payer: Self-pay | Admitting: Family Medicine

## 2022-03-26 VITALS — BP 100/70 | HR 72 | Temp 97.7°F | Resp 18 | Ht 67.0 in | Wt 163.6 lb

## 2022-03-26 DIAGNOSIS — E039 Hypothyroidism, unspecified: Secondary | ICD-10-CM

## 2022-03-26 DIAGNOSIS — D508 Other iron deficiency anemias: Secondary | ICD-10-CM | POA: Diagnosis not present

## 2022-03-26 DIAGNOSIS — N390 Urinary tract infection, site not specified: Secondary | ICD-10-CM | POA: Diagnosis not present

## 2022-03-26 DIAGNOSIS — R300 Dysuria: Secondary | ICD-10-CM

## 2022-03-26 DIAGNOSIS — N760 Acute vaginitis: Secondary | ICD-10-CM

## 2022-03-26 DIAGNOSIS — I10 Essential (primary) hypertension: Secondary | ICD-10-CM

## 2022-03-26 LAB — POC URINALSYSI DIPSTICK (AUTOMATED)
Blood, UA: NEGATIVE
Glucose, UA: NEGATIVE
Ketones, UA: NEGATIVE
Nitrite, UA: POSITIVE
Protein, UA: NEGATIVE
Spec Grav, UA: 1.02 (ref 1.010–1.025)
Urobilinogen, UA: 0.2 E.U./dL
pH, UA: 5 (ref 5.0–8.0)

## 2022-03-26 MED ORDER — NITROFURANTOIN MONOHYD MACRO 100 MG PO CAPS: 100.0000 mg | ORAL_CAPSULE | Freq: Two times a day (BID) | ORAL | 0 refills | Status: DC

## 2022-03-26 MED ORDER — FLUCONAZOLE 150 MG PO TABS: ORAL_TABLET | ORAL | 0 refills | Status: DC

## 2022-03-26 NOTE — Assessment & Plan Note (Signed)
con't iron ?Recheck labs ?

## 2022-03-26 NOTE — Assessment & Plan Note (Signed)
Well controlled, no changes to meds. Encouraged heart healthy diet such as the DASH diet and exercise as tolerated.  °

## 2022-03-26 NOTE — Assessment & Plan Note (Signed)
ua -- + uti ?macobid x 7 days ?Culture pending  ?

## 2022-03-26 NOTE — Progress Notes (Signed)
? ?Subjective:  ? ? ?By signing my name below, I, Stacy Ayers, attest that this documentation has been prepared under the direction and in the presence of Stacy Held, DO. 03/26/2022 ? ? Patient ID: Stacy Ayers, female    DOB: [birth year], 81 y.o.   MRN: [id number] ? ?Chief Complaint  ?Patient presents with  ?? Urinary Frequency  ?  Sxs started 1-2 days ago. Pt states having pain and freq. Pt states taking Azo  ? ? ?HPI ?Patient is in today for an office visit. ? ?She complains of urinary frequency, itching and occasional abdominal pain that started 2-3 days ago. ? ?Urinalysis confirmed the presence of a UTI. ? ?She went to the cardiologist and changed her medication from metoprolol succinate to propafenone because she was feeling very fatigued. Notes she still feels tired but less than before. ? ?She would like to recheck her thyroid and hemoglobin levels. ? ?Past Medical History:  ?Diagnosis Date  ?? Abnormal liver function tests   ?? Allergy   ?? Anxiety   ?? Arthritis   ? knees, lower back   ?? Asthma   ?? Cancer [facility])   ?? Cataract   ? left eye growing cataract   ?? Depression   ?? Diabetes mellitus   ? had gastric bypass DM resolved-   ?? Diverticulosis   ?? GERD (gastroesophageal reflux disease)   ?? Headache   ? Previous migraines  ?? Heart murmur   ?? History of colon polyps   ? adenomatous  ?? Hyperlipidemia   ? past hx- resloved after gastric bypass   ?? Hypertension   ?? Hypothyroidism   ?? IBS (irritable bowel syndrome)   ?? Iron deficiency anemia   ?? Osteoporosis   ? ? ?Past Surgical History:  ?Procedure Laterality Date  ?? ABDOMINAL HYSTERECTOMY    ?? ABDOMINAL HYSTERECTOMY    ?? APPENDECTOMY    ?? BLADDER REPAIR    ?? BREAST BIOPSY Right   ? needle core biopsy, benign  ?? CATARACT EXTRACTION Left   ?? CHOLECYSTECTOMY    ?? COLONOSCOPY    ?? CORONARY ANGIOPLASTY    ?? GASTRIC BYPASS    ?? POLYPECTOMY    ?? REVERSE SHOULDER ARTHROPLASTY Right 07/15/2020  ? Procedure: REVERSE SHOULDER  ARTHROPLASTY;  Surgeon: Justice Britain, MD;  Location: [facility];  Service: Orthopedics;  Laterality: Right;  121mn  ?? TONSILLECTOMY AND ADENOIDECTOMY    ?? 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  ? Procedure: TOTAL KNEE ARTHROPLASTY;  Surgeon: AGaynelle Arabian MD;  Location: [facility];  Service: Orthopedics;  Laterality: Left;  ? ? ?Family History  ?Problem Relation Age of Onset  ?? Stroke Mother   ?? Asthma Mother   ?? Anuerysm Mother   ?? Obesity Father   ?? Diabetes Sister   ?? Heart disease Maternal Grandmother   ?? Angina Maternal Grandmother   ?? Suicidality Maternal Grandfather   ?? Cancer Paternal Grandmother   ?? Colon cancer Neg Hx   ?? Colon polyps Neg Hx   ?? Esophageal cancer Neg Hx   ?? Rectal cancer Neg Hx   ?? Stomach cancer Neg Hx   ?? Pancreatic cancer Neg Hx   ?? Liver disease Neg Hx   ? ? ?Social History  ? ?Socioeconomic History  ?? Marital status: Single  ?  Spouse name: Not on file  ?? Number of children: 1  ?? Years of education: Not on file  ?? Highest education level: Not on file  ?Occupational  History  ?? Occupation: retired Pharmacist, hospital  ?  Employer: RETIRED  ?Tobacco Use  ?? Smoking status: Former  ?  Packs/day: 0.30  ?  Years: 2.00  ?  Pack years: 0.60  ?  Types: Cigarettes  ?  Quit date: 07/11/1962  ?  Years since quitting: 59.7  ?? Smokeless tobacco: Never  ?Vaping Use  ?? Vaping Use: Never used  ?Substance and Sexual Activity  ?? Alcohol use: Not Currently  ?? Drug use: No  ?? Sexual activity: Yes  ?  Birth control/protection: Surgical  ?  Comment: Hysterectomy  ?Other Topics Concern  ?? Not on file  ?Social History Narrative  ? Daily caffeine  ? ?Social Determinants of Health  ? ?Financial Resource Strain: Low Risk   ?? Difficulty of Paying Living Expenses: Not hard at all  ?Food Insecurity: No Food Insecurity  ?? Worried About Charity fundraiser in the Last Year: Never true  ?? Ran Out of Food in the Last Year: Never true  ?Transportation Needs: No Transportation Needs  ?? Lack of  Transportation (Medical): No  ?? Lack of Transportation (Non-Medical): No  ?Physical Activity: Inactive  ?? Days of Exercise per Week: 0 days  ?? Minutes of Exercise per Session: 0 min  ?Stress: No Stress Concern Present  ?? Feeling of Stress : Not at all  ?Social Connections: Moderately Integrated  ?? Frequency of Communication with Friends and Family: More than three times a week  ?? Frequency of Social Gatherings with Friends and Family: More than three times a week  ?? Attends Religious Services: More than 4 times per year  ?? Active Member of Clubs or Organizations: Yes  ?? Attends Archivist Meetings: More than 4 times per year  ?? Marital Status: Divorced  ?Intimate Partner Violence: Not At Risk  ?? Fear of Current or Ex-Partner: No  ?? Emotionally Abused: No  ?? Physically Abused: No  ?? Sexually Abused: No  ? ? ?Outpatient Medications Prior to Visit  ?Medication Sig Dispense Refill  ?? acetaminophen (TYLENOL) 650 MG CR tablet Take 650 mg by mouth every 8 (eight) hours as needed for pain.    ?? albuterol (PROVENTIL HFA) 108 (90 Base) MCG/ACT inhaler Inhale 2 puffs into the lungs every 6 (six) hours as needed. 1 each 3  ?? denosumab (PROLIA) 60 MG/ML SOSY injection Inject 60 mg into the skin every 6 (six) months. Deliver to [address], [place] [address] 1 mL 1  ?? diclofenac Sodium (VOLTAREN) 1 % GEL Apply 2 g topically 4 (four) times daily. Do not apply to open area, apply to knee pain area    ?? ferrous sulfate 325 (65 FE) MG tablet Take 325 mg by mouth daily with breakfast.    ?? guaiFENesin (MUCINEX) 600 MG 12 hr tablet Take 2 tablets (1,200 mg total) by mouth 2 (two) times daily. 30 tablet 2  ?? losartan (COZAAR) 50 MG tablet Take 1 tablet (50 mg total) by mouth daily. 90 tablet 1  ?? metoprolol tartrate (LOPRESSOR) 25 MG tablet Take 1 tablet (25 mg total) by mouth 3 (three) times daily as needed (palpitations). 90 tablet 3  ?? pantoprazole (PROTONIX) 40 MG tablet TAKE 1  TABLET ONCE DAILY. 90 tablet 3  ?? propafenone (RYTHMOL SR) 225 MG 12 hr capsule Take 1 capsule (225 mg total) by mouth 2 (two) times daily. 180 capsule 3  ?? SYNTHROID 137 MCG tablet TAKE 1 TABLET ONCE DAILY ON MONDAY, WEDNESDAY, AND  FRIDAY. TAKE 150MCG DAILY ALL OTHER DAYS. 90 tablet 1  ?? SYNTHROID 150 MCG tablet Take 1 tablet on Sun, Tues, Thurs, and Sat. 64 tablet 1  ?? venlafaxine XR (EFFEXOR-XR) 75 MG 24 hr capsule TAKE 1 CAPSULE ONCE DAILY WITH BREAKFAST. 90 capsule 1  ? ?No facility-administered medications prior to visit.  ? ? ?Allergies  ?Allergen Reactions  ?? Sulfonamide Derivatives Diarrhea  ?? Cocaine   ?  Other reaction(s): Hallucination ?Sinus irrigation  ?? Montelukast Sodium   ?  UNKNOWN  ?? Penicillins   ?  Local skin reaction. ? ?Tolerated ancef 07/15/2020 ?Tolerated Cephalosporin Date: 10/20/21. ? ?  ?? Pioglitazone   ?  UNKNOWN   ?? Rosiglitazone Maleate   ?  UNKNOWN  ?? Secobarbital Sodium   ?  UNKNOWN  ?? Cefdinir Palpitations  ?  Possible reaction   ?? Codeine Rash  ?? Doxycycline Nausea Only  ?  REACTION: GI UPSET  ? ? ?Review of Systems  ?Constitutional:  Positive for malaise/fatigue. Negative for fever.  ?HENT:  Negative for congestion, ear pain, hearing loss, sinus pain and sore throat.   ?Eyes:  Negative for blurred vision and pain.  ?Respiratory:  Negative for cough, sputum production, shortness of breath and wheezing.   ?Cardiovascular:  Negative for chest pain and palpitations.  ?Gastrointestinal:  Positive for abdominal pain. Negative for blood in stool, constipation, diarrhea, nausea and vomiting.  ?Genitourinary:  Positive for urgency. Negative for dysuria, frequency and hematuria.  ?Musculoskeletal:  Negative for back pain, falls and myalgias.  ?Skin:  Positive for itching.  ?Neurological:  Negative for dizziness, sensory change, loss of consciousness, weakness and headaches.  ?Endo/Heme/Allergies:  Negative for environmental allergies. Does not bruise/bleed easily.   ?Psychiatric/Behavioral:  Negative for depression and suicidal ideas. The patient is not nervous/anxious and does not have insomnia.   ? ?   ?Objective:  ?  ?Physical Exam ?Vitals and nursing note reviewed.  ?Constitutional:   ?   Gen

## 2022-03-26 NOTE — Assessment & Plan Note (Signed)
Check labs today ?con't synthroid  ?

## 2022-03-26 NOTE — Patient Instructions (Signed)
Urinary Tract Infection, Adult  A urinary tract infection (UTI) is an infection of any part of the urinary tract. The urinary tract includes the kidneys, ureters, bladder, and urethra. These organs make, store, and get rid of urine in the body. An upper UTI affects the ureters and kidneys. A lower UTI affects the bladder and urethra. What are the causes? Most urinary tract infections are caused by bacteria in your genital area around your urethra, where urine leaves your body. These bacteria grow and cause inflammation of your urinary tract. What increases the risk? You are more likely to develop this condition if: You have a urinary catheter that stays in place. You are not able to control when you urinate or have a bowel movement (incontinence). You are female and you: Use a spermicide or diaphragm for birth control. Have low estrogen levels. Are pregnant. You have certain genes that increase your risk. You are sexually active. You take antibiotic medicines. You have a condition that causes your flow of urine to slow down, such as: An enlarged prostate, if you are female. Blockage in your urethra. A kidney stone. A nerve condition that affects your bladder control (neurogenic bladder). Not getting enough to drink, or not urinating often. You have certain medical conditions, such as: Diabetes. A weak disease-fighting system (immunesystem). Sickle cell disease. Gout. Spinal cord injury. What are the signs or symptoms? Symptoms of this condition include: Needing to urinate right away (urgency). Frequent urination. This may include small amounts of urine each time you urinate. Pain or burning with urination. Blood in the urine. Urine that smells bad or unusual. Trouble urinating. Cloudy urine. Vaginal discharge, if you are female. Pain in the abdomen or the lower back. You may also have: Vomiting or a decreased appetite. Confusion. Irritability or tiredness. A fever or  chills. Diarrhea. The first symptom in older adults may be confusion. In some cases, they may not have any symptoms until the infection has worsened. How is this diagnosed? This condition is diagnosed based on your medical history and a physical exam. You may also have other tests, including: Urine tests. Blood tests. Tests for STIs (sexually transmitted infections). If you have had more than one UTI, a cystoscopy or imaging studies may be done to determine the cause of the infections. How is this treated? Treatment for this condition includes: Antibiotic medicine. Over-the-counter medicines to treat discomfort. Drinking enough water to stay hydrated. If you have frequent infections or have other conditions such as a kidney stone, you may need to see a health care provider who specializes in the urinary tract (urologist). In rare cases, urinary tract infections can cause sepsis. Sepsis is a life-threatening condition that occurs when the body responds to an infection. Sepsis is treated in the hospital with IV antibiotics, fluids, and other medicines. Follow these instructions at home:  Medicines Take over-the-counter and prescription medicines only as told by your health care provider. If you were prescribed an antibiotic medicine, take it as told by your health care provider. Do not stop using the antibiotic even if you start to feel better. General instructions Make sure you: Empty your bladder often and completely. Do not hold urine for long periods of time. Empty your bladder after sex. Wipe from front to back after urinating or having a bowel movement if you are female. Use each tissue only one time when you wipe. Drink enough fluid to keep your urine pale yellow. Keep all follow-up visits. This is important. Contact a health   care provider if: Your symptoms do not get better after 1-2 days. Your symptoms go away and then return. Get help right away if: You have severe pain in  your back or your lower abdomen. You have a fever or chills. You have nausea or vomiting. Summary A urinary tract infection (UTI) is an infection of any part of the urinary tract, which includes the kidneys, ureters, bladder, and urethra. Most urinary tract infections are caused by bacteria in your genital area. Treatment for this condition often includes antibiotic medicines. If you were prescribed an antibiotic medicine, take it as told by your health care provider. Do not stop using the antibiotic even if you start to feel better. Keep all follow-up visits. This is important. This information is not intended to replace advice given to you by your health care provider. Make sure you discuss any questions you have with your health care provider. Document Revised: 06/27/2020 Document Reviewed: 06/27/2020 Elsevier Patient Education  2023 Elsevier Inc.  

## 2022-03-27 LAB — COMPREHENSIVE METABOLIC PANEL
AG Ratio: 1.8 (calc) (ref 1.0–2.5)
ALT: 12 U/L (ref 6–29)
AST: 13 U/L (ref 10–35)
Albumin: 4 g/dL (ref 3.6–5.1)
Alkaline phosphatase (APISO): 56 U/L (ref 37–153)
BUN: 13 mg/dL (ref 7–25)
CO2: 26 mmol/L (ref 20–32)
Calcium: 9.1 mg/dL (ref 8.6–10.4)
Chloride: 105 mmol/L (ref 98–110)
Creat: 0.63 mg/dL (ref 0.60–1.00)
Globulin: 2.2 g/dL (calc) (ref 1.9–3.7)
Glucose, Bld: 96 mg/dL (ref 65–99)
Potassium: 4.6 mmol/L (ref 3.5–5.3)
Sodium: 138 mmol/L (ref 135–146)
Total Bilirubin: 0.4 mg/dL (ref 0.2–1.2)
Total Protein: 6.2 g/dL (ref 6.1–8.1)

## 2022-03-27 LAB — CBC WITH DIFFERENTIAL/PLATELET
Absolute Monocytes: 290 cells/uL (ref 200–950)
Basophils Absolute: 80 cells/uL (ref 0–200)
Basophils Relative: 1.6 %
Eosinophils Absolute: 150 cells/uL (ref 15–500)
Eosinophils Relative: 3 %
HCT: 34 % — ABNORMAL LOW (ref 35.0–45.0)
Hemoglobin: 11.4 g/dL — ABNORMAL LOW (ref 11.7–15.5)
Lymphs Abs: 1770 cells/uL (ref 850–3900)
MCH: 32.5 pg (ref 27.0–33.0)
MCHC: 33.5 g/dL (ref 32.0–36.0)
MCV: 96.9 fL (ref 80.0–100.0)
MPV: 9.6 fL (ref 7.5–12.5)
Monocytes Relative: 5.8 %
Neutro Abs: 2710 cells/uL (ref 1500–7800)
Neutrophils Relative %: 54.2 %
Platelets: 208 10*3/uL (ref 140–400)
RBC: 3.51 10*6/uL — ABNORMAL LOW (ref 3.80–5.10)
RDW: 13.5 % (ref 11.0–15.0)
Total Lymphocyte: 35.4 %
WBC: 5 10*3/uL (ref 3.8–10.8)

## 2022-03-27 LAB — LIPID PANEL
Cholesterol: 174 mg/dL (ref <200)
HDL: 51 mg/dL (ref >=50)
LDL Cholesterol (Calc): 96 mg/dL (calc)
Non-HDL Cholesterol (Calc): 123 mg/dL (calc) (ref <130)
Total CHOL/HDL Ratio: 3.4 (calc) (ref <5.0)
Triglycerides: 172 mg/dL — ABNORMAL HIGH (ref <150)

## 2022-03-27 LAB — URINE CULTURE
MICRO NUMBER:: 13326556
Result:: NO GROWTH
SPECIMEN QUALITY:: ADEQUATE

## 2022-03-27 LAB — TSH: TSH: 0.65 mIU/L (ref 0.40–4.50)

## 2022-03-27 LAB — IRON,TIBC AND FERRITIN PANEL
%SAT: 41 % (calc) (ref 16–45)
Ferritin: 98 ng/mL (ref 16–288)
Iron: 126 ug/dL (ref 45–160)
TIBC: 304 mcg/dL (calc) (ref 250–450)

## 2022-04-02 ENCOUNTER — Telehealth: Payer: Self-pay | Admitting: Cardiology

## 2022-04-02 NOTE — Telephone Encounter (Signed)
Spoke to pharmD about Rythmol and side effects. Also noted patient is currently being treated for a UTI.  ? ?Advised patient per pharmD to give it some time and let the UTI clear/finish Diflucan and if the symptoms continue to call us back, because these side effects could come from the UTI or Rythmol.  ?Verbalized understand and agreement.  ?

## 2022-04-02 NOTE — Telephone Encounter (Signed)
Pt c/o medication issue: ? ?1. Name of Medication: Propafenone ? ?2. How are you currently taking this medication (dosage and times per day)? 2 times a day ? ?3. Are you having a reaction (difficulty breathing--STAT)?  ? ?4. What is your medication issue? Tired, sleeping a lot during the day and weepy- she just does not feel like herself ? ?

## 2022-05-31 ENCOUNTER — Other Ambulatory Visit: Payer: Self-pay | Admitting: Family Medicine

## 2022-05-31 DIAGNOSIS — F418 Other specified anxiety disorders: Secondary | ICD-10-CM

## 2022-06-06 NOTE — Progress Notes (Unsigned)
Cardiology Office Note Date:  06/06/2022  Patient ID:  Stacy, Ayers 12-31-1940, MRN [id number] PCP:  [name], DO  Cardiologist:  Dr. Johney Frame Electrophysiologist: Dr. Quentin Ore  ***refresh   Chief Complaint: *** 3 mo  History of Present Illness: Stacy Ayers is a 81 y.o. female with history of HTN, hypothyroidism, anxiety, asthma, depression, obesity (s/p gartic bypass)  She was referred to Dr. Johney Frame for palpitations, monitoring noted numerous episodes of SVT, 120's, echo noted LVEF 60-65%, Toprol changed to BID ('25mg'$  AM, '50mg'$  PM), with an extra for palps.  She last saw Dr. Johney Frame April 2023, c/w palpitations, felt ongoing fatigue post COVID moths earlier, though slowly improving.  Some orthostatic dizziness. Her EKG this day was an ectopic atrial rhythm Referred to EP  She saw Dr. Quentin Ore 03/10/22, daily palpitations, seemingly post knee surgery 09/2021, increased fatigue and dizziness with increase in Toprol. SVT felt to be an ATach, her metoprolol stopped and started on propafenone Scheduled for EKGs and EP APP f/u  F/u EKG was OK  May called with fatgue, emotional instability, not feeling herself, questioned the rhythmol, advised since she was also being treated for UTI let hat treat and clear qst and monitor symptoms.  *** symptoms, palps, fatigue, otherwise? *** EKG, intervals   AAD Hx Propafenone started April 2023 for an atrial tachycardia   Past Medical History:  Diagnosis Date   Abnormal liver function tests    Allergy    Anxiety    Arthritis    knees, lower back    Asthma    Cancer ([place])    Cataract    left eye growing cataract    Depression    Diabetes mellitus    had gastric bypass DM resolved-    Diverticulosis    GERD (gastroesophageal reflux disease)    Headache    Previous migraines   Heart murmur    History of colon polyps    adenomatous   Hyperlipidemia    past hx- resloved after gastric bypass    Hypertension     Hypothyroidism    IBS (irritable bowel syndrome)    Iron deficiency anemia    Osteoporosis     Past Surgical History:  Procedure Laterality Date   ABDOMINAL HYSTERECTOMY     ABDOMINAL HYSTERECTOMY     APPENDECTOMY     BLADDER REPAIR     BREAST BIOPSY Right    needle core biopsy, benign   CATARACT EXTRACTION Left    CHOLECYSTECTOMY     COLONOSCOPY     CORONARY ANGIOPLASTY     GASTRIC BYPASS     POLYPECTOMY     REVERSE SHOULDER ARTHROPLASTY Right 07/15/2020   Procedure: REVERSE SHOULDER ARTHROPLASTY;  Surgeon: Justice Britain, MD;  Location: [facility];  Service: Orthopedics;  Laterality: Right;  145mn   TONSILLECTOMY AND ADENOIDECTOMY     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   Procedure: TOTAL KNEE ARTHROPLASTY;  Surgeon: AGaynelle Arabian MD;  Location: [facility];  Service: Orthopedics;  Laterality: Left;    Current Outpatient Medications  Medication Sig Dispense Refill   venlafaxine XR (EFFEXOR-XR) 75 MG 24 hr capsule TAKE 1 CAPSULE ONCE DAILY WITH BREAKFAST. 90 capsule 1   acetaminophen (TYLENOL) 650 MG CR tablet Take 650 mg by mouth every 8 (eight) hours as needed for pain.     albuterol (PROVENTIL HFA) 108 (90 Base) MCG/ACT inhaler Inhale 2 puffs into the lungs every 6 (six) hours as needed. 1 each 3  denosumab (PROLIA) 60 MG/ML SOSY injection Inject 60 mg into the skin every 6 (six) months. Deliver to [address], [place] [address] 1 mL 1   diclofenac Sodium (VOLTAREN) 1 % GEL Apply 2 g topically 4 (four) times daily. Do not apply to open area, apply to knee pain area     ferrous sulfate 325 (65 FE) MG tablet Take 325 mg by mouth daily with breakfast.     fluconazole (DIFLUCAN) 150 MG tablet 1 po x1, may repeat in 3 days prn 2 tablet 0   guaiFENesin (MUCINEX) 600 MG 12 hr tablet Take 2 tablets (1,200 mg total) by mouth 2 (two) times daily. 30 tablet 2   losartan (COZAAR) 50 MG tablet Take 1 tablet (50 mg total) by mouth daily. 90 tablet 1   metoprolol tartrate  (LOPRESSOR) 25 MG tablet Take 1 tablet (25 mg total) by mouth 3 (three) times daily as needed (palpitations). 90 tablet 3   nitrofurantoin, macrocrystal-monohydrate, (MACROBID) 100 MG capsule Take 1 capsule (100 mg total) by mouth 2 (two) times daily. 14 capsule 0   pantoprazole (PROTONIX) 40 MG tablet TAKE 1 TABLET ONCE DAILY. 90 tablet 3   propafenone (RYTHMOL SR) 225 MG 12 hr capsule Take 1 capsule (225 mg total) by mouth 2 (two) times daily. 180 capsule 3   SYNTHROID 137 MCG tablet TAKE 1 TABLET ONCE DAILY ON MONDAY, WEDNESDAY, AND FRIDAY. TAKE 150MCG DAILY ALL OTHER DAYS. 90 tablet 1   SYNTHROID 150 MCG tablet Take 1 tablet on Sun, Tues, Thurs, and Sat. 64 tablet 1   No current facility-administered medications for this visit.    Allergies:   Sulfonamide derivatives, Cocaine, Montelukast sodium, Penicillins, Pioglitazone, Rosiglitazone maleate, Secobarbital sodium, Cefdinir, Codeine, and Doxycycline   Social History:  The patient  reports that she quit smoking about 59 years ago. Her smoking use included cigarettes. She has a 0.60 pack-year smoking history. She has never used smokeless tobacco. She reports that she does not currently use alcohol. She reports that she does not use drugs.   Family History:  The patient's family history includes Angina in her maternal grandmother; Anuerysm in her mother; Asthma in her mother; Cancer in her paternal grandmother; Diabetes in her sister; Heart disease in her maternal grandmother; Obesity in her father; Stroke in her mother; Suicidality in her maternal grandfather.  ROS:  Please see the history of present illness.    All other systems are reviewed and otherwise negative.   PHYSICAL EXAM:  VS:  There were no vitals taken for this visit. BMI: There is no height or weight on file to calculate BMI. Well nourished, well developed, in no acute distress HEENT: normocephalic, atraumatic Neck: no JVD, carotid bruits or masses Cardiac:  *** RRR; no  significant murmurs, no rubs, or gallops Lungs:  *** CTA b/l, no wheezing, rhonchi or rales Abd: soft, nontender MS: no deformity or *** atrophy Ext: *** no edema Skin: warm and dry, no rash Neuro:  No gross deficits appreciated Psych: euthymic mood, full affect  *** PPM/ICD site is stable, no tethering or discomfort   EKG:  Done today and reviewed by myself shows  ***   Dec 2022 monitor Patch wear time was 13 days and 16 hours Predominant rhythm was NSR with average HR 72 Frequent episodes of SVT (1201) with longest lasting 29mn at rate 124bpm Rare VE, no significant pauses  12/15/2021: TTE 1. Left ventricular ejection fraction, by estimation, is 60 to 65%. The  left ventricle has normal function. The left ventricle has no regional  wall motion abnormalities. Left ventricular diastolic parameters were  normal.   2. Right ventricular systolic function is normal. The right ventricular  size is normal.   3. Left atrial size was mildly dilated.   4. The mitral valve is normal in structure. Mild mitral valve  regurgitation. No evidence of mitral stenosis.   5. The aortic valve is normal in structure. Aortic valve regurgitation is  not visualized. No aortic stenosis is present.   6. The inferior vena cava is normal in size with greater than 50%  respiratory variability, suggesting right atrial pressure of 3 mmHg.   Recent Labs: 11/02/2021: Magnesium 2.4 03/26/2022: ALT 12; BUN 13; Creat 0.63; Hemoglobin 11.4; Platelets 208; Potassium 4.6; Sodium 138; TSH 0.65  03/26/2022: Cholesterol 174; HDL 51; LDL Cholesterol (Calc) 96; Total CHOL/HDL Ratio 3.4; Triglycerides 172   CrCl cannot be calculated (Patient's most recent lab result is older than the maximum 21 days allowed.).   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  The patient did not have any concerns regarding medicines.  Venetia Night, PA-C 06/06/2022 4:29 PM     [address] [place] [address] [phone] (office)  [phone] (fax)

## 2022-06-08 ENCOUNTER — Encounter: Payer: Self-pay | Admitting: Physician Assistant

## 2022-06-08 ENCOUNTER — Ambulatory Visit: Payer: Medicare PPO | Admitting: Physician Assistant

## 2022-06-08 VITALS — BP 128/72 | HR 65 | Ht 67.5 in | Wt 166.4 lb

## 2022-06-08 DIAGNOSIS — I10 Essential (primary) hypertension: Secondary | ICD-10-CM

## 2022-06-08 DIAGNOSIS — I700 Atherosclerosis of aorta: Secondary | ICD-10-CM

## 2022-06-08 DIAGNOSIS — I471 Supraventricular tachycardia: Secondary | ICD-10-CM | POA: Diagnosis not present

## 2022-06-08 NOTE — Patient Instructions (Signed)
Medication Instructions:   Your physician recommends that you continue on your current medications as directed. Please refer to the Current Medication list given to you today.   *If you need a refill on your cardiac medications before your next appointment, please call your pharmacy*   Lab Work:  None ordered.  If you have labs (blood work) drawn today and your tests are completely normal, you will receive your results only by: [address] (if you have MyChart) OR A paper copy in the mail If you have any lab test that is abnormal or we need to change your treatment, we will call you to review the results.   Testing/Procedures:   None ordered.   Follow-Up: At [facility], you and your health needs are our priority.  As part of our continuing mission to provide you with exceptional heart care, we have created designated Provider Care Teams.  These Care Teams include your primary Cardiologist (physician) and Advanced Practice Providers (APPs -  Physician Assistants and Nurse Practitioners) who all work together to provide you with the care you need, when you need it.  We recommend signing up for the patient portal called "MyChart".  Sign up information is provided on this After Visit Summary.  MyChart is used to connect with patients for Virtual Visits (Telemedicine).  Patients are able to view lab/test results, encounter notes, upcoming appointments, etc.  Non-urgent messages can be sent to your provider as well.   To learn more about what you can do with MyChart, go to [email or web address].    Your next appointment:   6 month(s)  The format for your next appointment:   In Person  Provider:   Dr. Lars Mage  If primary card or EP is not listed click here to update    :1}    Other Instructions  Your physician wants you to follow-up in: 6 month with Dr. Quentin Ore.  You will receive a reminder letter in the mail two months in advance. If you don't receive a letter,  please call our office to schedule the follow-up appointment.     Important Information About Sugar

## 2022-06-25 ENCOUNTER — Other Ambulatory Visit (HOSPITAL_COMMUNITY): Payer: Self-pay

## 2022-07-01 ENCOUNTER — Other Ambulatory Visit (HOSPITAL_COMMUNITY): Payer: Self-pay

## 2022-07-02 ENCOUNTER — Other Ambulatory Visit (HOSPITAL_COMMUNITY): Payer: Self-pay

## 2022-07-06 ENCOUNTER — Other Ambulatory Visit (HOSPITAL_COMMUNITY): Payer: Self-pay

## 2022-07-13 ENCOUNTER — Other Ambulatory Visit (HOSPITAL_COMMUNITY): Payer: Self-pay

## 2022-07-16 ENCOUNTER — Telehealth: Payer: Self-pay | Admitting: Pharmacist

## 2022-07-16 ENCOUNTER — Other Ambulatory Visit (HOSPITAL_COMMUNITY): Payer: Self-pay

## 2022-07-16 DIAGNOSIS — E559 Vitamin D deficiency, unspecified: Secondary | ICD-10-CM

## 2022-07-16 DIAGNOSIS — M810 Age-related osteoporosis without current pathological fracture: Secondary | ICD-10-CM

## 2022-07-16 NOTE — Telephone Encounter (Signed)
Patient due for next Prolia on 07/24/22. She will need labs updated. Standing lab orders for CBC, CMP, and Vitamin D placed today.  Per test claim, patient's copay for Prolia is $64.  Knox Saliva, PharmD, MPH, BCPS, CPP Clinical Pharmacist (Rheumatology and Pulmonology)

## 2022-07-18 ENCOUNTER — Other Ambulatory Visit: Payer: Self-pay | Admitting: Family Medicine

## 2022-07-18 DIAGNOSIS — I10 Essential (primary) hypertension: Secondary | ICD-10-CM

## 2022-07-19 NOTE — Telephone Encounter (Signed)
Spoke with patient - she will stop by this week to have Prolia labs completed. She was provided with walk-in lab hours.  She is comfortable with $64 copay for prolia  Knox Saliva, PharmD, MPH, BCPS, CPP Clinical Pharmacist (Rheumatology and Pulmonology)

## 2022-07-21 ENCOUNTER — Other Ambulatory Visit (HOSPITAL_COMMUNITY): Payer: Self-pay

## 2022-07-22 ENCOUNTER — Other Ambulatory Visit (HOSPITAL_COMMUNITY): Payer: Self-pay

## 2022-07-23 ENCOUNTER — Other Ambulatory Visit: Payer: Self-pay

## 2022-07-23 DIAGNOSIS — M810 Age-related osteoporosis without current pathological fracture: Secondary | ICD-10-CM | POA: Diagnosis not present

## 2022-07-23 DIAGNOSIS — E559 Vitamin D deficiency, unspecified: Secondary | ICD-10-CM

## 2022-07-24 LAB — CBC WITH DIFFERENTIAL/PLATELET
Absolute Monocytes: 374 cells/uL (ref 200–950)
Basophils Absolute: 59 cells/uL (ref 0–200)
Basophils Relative: 1.3 %
Eosinophils Absolute: 180 cells/uL (ref 15–500)
Eosinophils Relative: 4 %
HCT: 31.7 % — ABNORMAL LOW (ref 35.0–45.0)
Hemoglobin: 10.8 g/dL — ABNORMAL LOW (ref 11.7–15.5)
Lymphs Abs: 1409 cells/uL (ref 850–3900)
MCH: 34.2 pg — ABNORMAL HIGH (ref 27.0–33.0)
MCHC: 34.1 g/dL (ref 32.0–36.0)
MCV: 100.3 fL — ABNORMAL HIGH (ref 80.0–100.0)
MPV: 9.3 fL (ref 7.5–12.5)
Monocytes Relative: 8.3 %
Neutro Abs: 2480 cells/uL (ref 1500–7800)
Neutrophils Relative %: 55.1 %
Platelets: 191 10*3/uL (ref 140–400)
RBC: 3.16 10*6/uL — ABNORMAL LOW (ref 3.80–5.10)
RDW: 12.2 % (ref 11.0–15.0)
Total Lymphocyte: 31.3 %
WBC: 4.5 10*3/uL (ref 3.8–10.8)

## 2022-07-24 LAB — VITAMIN D 25 HYDROXY (VIT D DEFICIENCY, FRACTURES): Vit D, 25-Hydroxy: 32 ng/mL (ref 30–100)

## 2022-07-24 LAB — COMPREHENSIVE METABOLIC PANEL
AG Ratio: 2.1 (calc) (ref 1.0–2.5)
ALT: 18 U/L (ref 6–29)
AST: 22 U/L (ref 10–35)
Albumin: 4.2 g/dL (ref 3.6–5.1)
Alkaline phosphatase (APISO): 54 U/L (ref 37–153)
BUN: 10 mg/dL (ref 7–25)
CO2: 27 mmol/L (ref 20–32)
Calcium: 9.3 mg/dL (ref 8.6–10.4)
Chloride: 104 mmol/L (ref 98–110)
Creat: 0.82 mg/dL (ref 0.60–1.00)
Globulin: 2 g/dL (calc) (ref 1.9–3.7)
Glucose, Bld: 95 mg/dL (ref 65–99)
Potassium: 4.3 mmol/L (ref 3.5–5.3)
Sodium: 139 mmol/L (ref 135–146)
Total Bilirubin: 0.5 mg/dL (ref 0.2–1.2)
Total Protein: 6.2 g/dL (ref 6.1–8.1)

## 2022-07-25 NOTE — Progress Notes (Signed)
Lab results are fine for continuing prolia vitamin D, calcium, and kidney function all normal.

## 2022-07-26 ENCOUNTER — Other Ambulatory Visit (HOSPITAL_COMMUNITY): Payer: Self-pay

## 2022-07-26 MED ORDER — DENOSUMAB 60 MG/ML ~~LOC~~ SOSY
60.0000 mg | PREFILLED_SYRINGE | SUBCUTANEOUS | 1 refills | Status: DC
  Filled 2022-07-26: qty 1, 180d supply, fill #0
  Filled 2023-01-10: qty 1, 180d supply, fill #1

## 2022-07-26 NOTE — Telephone Encounter (Signed)
CBC, CMP and Vitamin D drawn on 07/23/22. Wnl to proceed with Prolia. Rx for Prolia sent to [facility] to be couriered to clinic  Prolia appt scheduled for 08/03/22  Knox Saliva, PharmD, MPH, BCPS, CPP Clinical Pharmacist (Rheumatology and Pulmonology)

## 2022-07-29 ENCOUNTER — Other Ambulatory Visit (HOSPITAL_COMMUNITY): Payer: Self-pay

## 2022-07-30 ENCOUNTER — Ambulatory Visit: Payer: Medicare PPO | Admitting: Family Medicine

## 2022-07-30 ENCOUNTER — Other Ambulatory Visit (HOSPITAL_BASED_OUTPATIENT_CLINIC_OR_DEPARTMENT_OTHER): Payer: Self-pay | Admitting: Family Medicine

## 2022-07-30 ENCOUNTER — Encounter: Payer: Self-pay | Admitting: Family Medicine

## 2022-07-30 VITALS — BP 118/86 | HR 75 | Temp 98.7°F | Resp 18 | Ht 67.5 in | Wt 165.4 lb

## 2022-07-30 DIAGNOSIS — F418 Other specified anxiety disorders: Secondary | ICD-10-CM

## 2022-07-30 DIAGNOSIS — Z1231 Encounter for screening mammogram for malignant neoplasm of breast: Secondary | ICD-10-CM

## 2022-07-30 DIAGNOSIS — Z91018 Allergy to other foods: Secondary | ICD-10-CM | POA: Diagnosis not present

## 2022-07-30 DIAGNOSIS — Z91030 Bee allergy status: Secondary | ICD-10-CM

## 2022-07-30 DIAGNOSIS — E559 Vitamin D deficiency, unspecified: Secondary | ICD-10-CM | POA: Diagnosis not present

## 2022-07-30 DIAGNOSIS — E785 Hyperlipidemia, unspecified: Secondary | ICD-10-CM

## 2022-07-30 DIAGNOSIS — D509 Iron deficiency anemia, unspecified: Secondary | ICD-10-CM | POA: Diagnosis not present

## 2022-07-30 DIAGNOSIS — D508 Other iron deficiency anemias: Secondary | ICD-10-CM

## 2022-07-30 DIAGNOSIS — R5383 Other fatigue: Secondary | ICD-10-CM

## 2022-07-30 DIAGNOSIS — E890 Postprocedural hypothyroidism: Secondary | ICD-10-CM

## 2022-07-30 DIAGNOSIS — E538 Deficiency of other specified B group vitamins: Secondary | ICD-10-CM

## 2022-07-30 DIAGNOSIS — I10 Essential (primary) hypertension: Secondary | ICD-10-CM

## 2022-07-30 LAB — CBC WITH DIFFERENTIAL/PLATELET
Basophils Absolute: 0.1 10*3/uL (ref 0.0–0.1)
Basophils Relative: 1.3 % (ref 0.0–3.0)
Eosinophils Absolute: 0.1 10*3/uL (ref 0.0–0.7)
Eosinophils Relative: 3 % (ref 0.0–5.0)
HCT: 32.5 % — ABNORMAL LOW (ref 36.0–46.0)
Hemoglobin: 11.2 g/dL — ABNORMAL LOW (ref 12.0–15.0)
Lymphocytes Relative: 25.6 % (ref 12.0–46.0)
Lymphs Abs: 1.2 10*3/uL (ref 0.7–4.0)
MCHC: 34.4 g/dL (ref 30.0–36.0)
MCV: 100.4 fl — ABNORMAL HIGH (ref 78.0–100.0)
Monocytes Absolute: 0.3 10*3/uL (ref 0.1–1.0)
Monocytes Relative: 6.4 % (ref 3.0–12.0)
Neutro Abs: 2.9 10*3/uL (ref 1.4–7.7)
Neutrophils Relative %: 63.7 % (ref 43.0–77.0)
Platelets: 191 10*3/uL (ref 150.0–400.0)
RBC: 3.23 Mil/uL — ABNORMAL LOW (ref 3.87–5.11)
RDW: 13.7 % (ref 11.5–15.5)
WBC: 4.5 10*3/uL (ref 4.0–10.5)

## 2022-07-30 LAB — COMPREHENSIVE METABOLIC PANEL
ALT: 21 U/L (ref 0–35)
AST: 23 U/L (ref 0–37)
Albumin: 4 g/dL (ref 3.5–5.2)
Alkaline Phosphatase: 60 U/L (ref 39–117)
BUN: 8 mg/dL (ref 6–23)
CO2: 28 mEq/L (ref 19–32)
Calcium: 9.2 mg/dL (ref 8.4–10.5)
Chloride: 104 mEq/L (ref 96–112)
Creatinine, Ser: 0.65 mg/dL (ref 0.40–1.20)
GFR: 83.88 mL/min (ref >60.00)
Glucose, Bld: 79 mg/dL (ref 70–99)
Potassium: 4.3 mEq/L (ref 3.5–5.1)
Sodium: 140 mEq/L (ref 135–145)
Total Bilirubin: 0.5 mg/dL (ref 0.2–1.2)
Total Protein: 6.4 g/dL (ref 6.0–8.3)

## 2022-07-30 LAB — TSH: TSH: 1.16 u[IU]/mL (ref 0.35–5.50)

## 2022-07-30 LAB — VITAMIN D 25 HYDROXY (VIT D DEFICIENCY, FRACTURES): VITD: 40.96 ng/mL (ref 30.00–100.00)

## 2022-07-30 LAB — IBC PANEL
Iron: 79 ug/dL (ref 42–145)
Saturation Ratios: 26.6 % (ref 20.0–50.0)
TIBC: 296.8 ug/dL (ref 250.0–450.0)
Transferrin: 212 mg/dL (ref 212.0–360.0)

## 2022-07-30 LAB — VITAMIN B12: Vitamin B-12: 1059 pg/mL — ABNORMAL HIGH (ref 211–911)

## 2022-07-30 LAB — PROTIME-INR
INR: 0.9 ratio (ref 0.8–1.0)
Prothrombin Time: 10.3 s (ref 9.6–13.1)

## 2022-07-30 LAB — APTT: aPTT: 30.1 s (ref 25.4–36.8)

## 2022-07-30 MED ORDER — VITAMIN D (ERGOCALCIFEROL) 1.25 MG (50000 UNIT) PO CAPS: 50000.0000 [IU] | ORAL_CAPSULE | ORAL | 0 refills | Status: DC

## 2022-07-30 MED ORDER — EPINEPHRINE 0.3 MG/0.3ML IJ SOAJ: 0.3000 mg | INTRAMUSCULAR | 1 refills | Status: DC | PRN

## 2022-07-30 NOTE — Assessment & Plan Note (Signed)
Check labs  If labs normal she will discuss her meds with cardiology

## 2022-07-30 NOTE — Progress Notes (Unsigned)
Pharmacy Note  Subjective:   Patient presents to clinic today to receive bi-annual dose of Prolia. Patient's last dose of Prolia was on 01/25/22  Patient running a fever or have signs/symptoms of infection? {yes/no:20286}  Patient currently on antibiotics for the treatment of infection? {yes/no:20286}  Patient had fall in the last 6 months?  {yes/no:20286}  If yes, did it require medical attention? {yes/no:20286}   Patient taking calcium 1200 mg daily through diet or supplement and at least 800 units vitamin D? {yes/no:20286}  Objective: CMP     Component Value Date/Time   NA 139 07/23/2022 1456   NA 142 11/02/2021 0000   K 4.3 07/23/2022 1456   CL 104 07/23/2022 1456   CO2 27 07/23/2022 1456   GLUCOSE 95 07/23/2022 1456   BUN 10 07/23/2022 1456   BUN 8 11/02/2021 0000   CREATININE 0.82 07/23/2022 1456   CALCIUM 9.3 07/23/2022 1456   PROT 6.2 07/23/2022 1456   PROT 7.2 04/18/2017 1517   ALBUMIN 3.3 (L) 10/31/2021 1006   ALBUMIN 4.5 04/18/2017 1517   AST 22 07/23/2022 1456   ALT 18 07/23/2022 1456   ALKPHOS 60 10/31/2021 1006   BILITOT 0.5 07/23/2022 1456   BILITOT 0.2 04/18/2017 1517   GFRNONAA >60 10/31/2021 1006   GFRAA >60 07/09/2020 0859    CBC    Component Value Date/Time   WBC 4.5 07/23/2022 1456   RBC 3.16 (L) 07/23/2022 1456   HGB 10.8 (L) 07/23/2022 1456   HGB 12.6 05/17/2006 1039   HCT 31.7 (L) 07/23/2022 1456   HCT 36.5 05/17/2006 1039   PLT 191 07/23/2022 1456   PLT 155 05/17/2006 1039   MCV 100.3 (H) 07/23/2022 1456   MCV 90.5 05/17/2006 1039   MCH 34.2 (H) 07/23/2022 1456   MCHC 34.1 07/23/2022 1456   RDW 12.2 07/23/2022 1456   RDW 14.7 (H) 05/17/2006 1039   LYMPHSABS 1,409 07/23/2022 1456   LYMPHSABS 1.1 05/17/2006 1039   MONOABS 0.3 11/17/2021 1201   MONOABS 0.3 05/17/2006 1039   EOSABS 180 07/23/2022 1456   EOSABS 0.1 05/17/2006 1039   BASOSABS 59 07/23/2022 1456   BASOSABS 0.0 05/17/2006 1039    Lab Results  Component Value Date    VD25OH 32 07/23/2022    T-score: ***  Assessment/Plan:   Reviewed importance of adequate dietary intake of calcium in addition to supplementation due to risk of hypocalcemia with Prolia.   Patient tolerated injection  ***.   Patient is to return in 10-14 days for labs to monitor for hypocalcemia.  Future orders placed.  Patient's next Prolia dose is due on ***.  Patient is due for updated DEXA in June 2024.   All questions encouraged and answered.  Instructed patient to call with any further questions or concerns.  Knox Saliva, PharmD, MPH, BCPS, CPP Clinical Pharmacist (Rheumatology and Pulmonology)

## 2022-07-30 NOTE — Assessment & Plan Note (Signed)
Epi pen 

## 2022-07-30 NOTE — Assessment & Plan Note (Signed)
Encourage heart healthy diet such as MIND or DASH diet, increase exercise, avoid trans fats, simple carbohydrates and processed foods, consider a krill or fish or flaxseed oil cap daily.  °

## 2022-07-30 NOTE — Patient Instructions (Signed)
Try taking B complex     Fatigue If you have fatigue, you feel tired all the time and have a lack of energy or a lack of motivation. Fatigue may make it difficult to start or complete tasks because of exhaustion. Occasional or mild fatigue is often a normal response to activity or life. However, long-term (chronic) or extreme fatigue may be a symptom of a medical condition such as: Depression. Not having enough red blood cells or hemoglobin in the blood (anemia). A problem with a small gland located in the lower front part of the neck (thyroid disorder). Rheumatologic conditions. These are problems related to the body's defense system (immune system). Infections, especially certain viral infections. Fatigue can also lead to negative health outcomes over time. Follow these instructions at home: Medicines Take over-the-counter and prescription medicines only as told by your health care provider. Take a multivitamin if told by your health care provider. Do not use herbal or dietary supplements unless they are approved by your health care provider. Eating and drinking  Avoid heavy meals in the evening. Eat a well-balanced diet, which includes lean proteins, whole grains, plenty of fruits and vegetables, and low-fat dairy products. Avoid eating or drinking too many products with caffeine in them. Avoid alcohol. Drink enough fluid to keep your urine pale yellow. Activity  Exercise regularly, as told by your health care provider. Use or practice techniques to help you relax, such as yoga, tai chi, meditation, or massage therapy. Lifestyle Change situations that cause you stress. Try to keep your work and personal schedules in balance. Do not use recreational or illegal drugs. General instructions Monitor your fatigue for any changes. Go to bed and get up at the same time every day. Avoid fatigue by pacing yourself during the day and getting enough sleep at night. Maintain a healthy  weight. Contact a health care provider if: Your fatigue does not get better. You have a fever. You suddenly lose or gain weight. You have headaches. You have trouble falling asleep or sleeping through the night. You feel angry, guilty, anxious, or sad. You have swelling in your legs or another part of your body. Get help right away if: You feel confused, feel like you might faint, or faint. Your vision is blurry or you have a severe headache. You have severe pain in your abdomen, your back, or the area between your waist and hips (pelvis). You have chest pain, shortness of breath, or an irregular or fast heartbeat. You are unable to urinate, or you urinate less than normal. You have abnormal bleeding from the rectum, nose, lungs, nipples, or, if you are female, the vagina. You vomit blood. You have thoughts about hurting yourself or others. These symptoms may be an emergency. Get help right away. Call 911. Do not wait to see if the symptoms will go away. Do not drive yourself to the hospital. Get help right away if you feel like you may hurt yourself or others, or have thoughts about taking your own life. Go to your nearest emergency room or: Call 911. Call the [facility] at [phone] or 988. This is open 24 hours a day. Text the Crisis Text Line at 305-030-2667. Summary If you have fatigue, you feel tired all the time and have a lack of energy or a lack of motivation. Fatigue may make it difficult to start or complete tasks because of exhaustion. Long-term (chronic) or extreme fatigue may be a symptom of a medical condition. Exercise  regularly, as told by your health care provider. Change situations that cause you stress. Try to keep your work and personal schedules in balance. This information is not intended to replace advice given to you by your health care provider. Make sure you discuss any questions you have with your health care provider. Document  Revised: 09/07/2021 Document Reviewed: 09/07/2021 Elsevier Patient Education  [place].

## 2022-07-30 NOTE — Assessment & Plan Note (Signed)
Allergy test con't antihistamine  Epi pen

## 2022-07-30 NOTE — Assessment & Plan Note (Signed)
Well controlled, no changes to meds. Encouraged heart healthy diet such as the DASH diet and exercise as tolerated.  °

## 2022-07-30 NOTE — Progress Notes (Signed)
Established Patient Office Visit  Subjective   Patient ID: Stacy Ayers, female    DOB: 07-13-1941  Age: 81 y.o. MRN: [id number]  Chief Complaint  Patient presents with   Fatigue    Pt states sxs going for a while. Pt states having fatigue, dizziness, and states left sided throat irritation. Pt states negative for COVID yesterday.      HPI Pt here c/o fatigue going on for a while. She thought it may be her heart medication but did not know if it could be something else.  Pt also c/o bruising toes but her feet esp L foot swells---- this is normal since Knee surgry on the L so she thought it might be from her shoes.    She also states she has noticed that whenever she eats walnuts she feels like she has a lump in her throat and she has difficulty swallowing --- no sob or trouble swallowing now  Patient Active Problem List   Diagnosis Date Noted   Nut allergy 07/30/2022   Bee sting allergy 07/30/2022   Urinary tract infection without hematuria 03/26/2022   Hypocalcemia 01/25/2022   Pressure injury of skin 10/29/2021   Lactic acidosis 10/28/2021   Left knee pain 10/28/2021   Primary osteoarthritis of left knee 10/19/2021   Pre-op evaluation 10/12/2021   Depression, major, single episode, mild (HCC) 07/24/2021   Age-related osteoporosis without current pathological fracture 06/25/2021   Diabetes mellitus (HCC) 04/28/2021   DM (diabetes mellitus) type II, controlled, with peripheral vascular disorder (HCC) 04/28/2021   Morbid obesity (HCC) 04/28/2021   Anemia 04/28/2021   Fatigue 03/23/2021   Right ear pain 11/06/2020   Osteoarthritis of right knee 10/01/2020   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   Viral upper respiratory tract infection 11/14/2018   OA (osteoarthritis) of knee 05/12/2018   Dysuria 05/03/2018   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   Seasonal allergies 08/11/2015   Alcohol dependence (HCC) 08/11/2015   CTS (carpal tunnel syndrome)  08/11/2015   Allergic rhinitis 12/19/2013   B12 deficiency 05/22/2013   Left foot pain 09/06/2012   Depression with anxiety 04/05/2012   Abdominal pain, generalized 01/18/2011   PERSONAL HISTORY OF COLONIC POLYPS 01/18/2011   SPONTANEOUS ECCHYMOSES 10/13/2010   DIARRHEA, CHRONIC 10/13/2010   PHARYNGITIS, CHRONIC 02/16/2010   Hypothyroidism 01/14/2010   History of bariatric surgery 02/25/2009   Vitamin D deficiency 09/10/2008   VARICOSE VEINS, LOWER EXTREMITIES 09/10/2008   PULMONARY NODULE 08/14/2008   FATIGUE 08/14/2008   MUSCLE PAIN 06/14/2007   BLADDER PAIN 06/14/2007   COLONIC POLYPS 03/14/2007   History of diet-controlled diabetes 03/14/2007   Hyperlipidemia LDL goal <100 03/14/2007   ANEMIA-IRON DEFICIENCY 03/14/2007   Essential hypertension 03/14/2007   VOCAL CORD PARALYSIS 03/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   EDEMA, LARYNX 03/14/2007   ASTHMA 03/14/2007   Esophageal reflux 03/14/2007   LIVER FUNCTION TESTS, ABNORMAL, HX OF 03/14/2007   TONSILLECTOMY AND ADENOIDECTOMY, HX OF 03/14/2007   Past Medical History:  Diagnosis Date   Abnormal liver function tests    Allergy    Anxiety    Arthritis    knees, lower back    Asthma    Cancer (HCC)    Cataract    left eye growing cataract    Depression    Diabetes mellitus    had gastric bypass DM resolved-    Diverticulosis    GERD (gastroesophageal reflux disease)    Headache    Previous migraines   Heart  murmur    History of colon polyps    adenomatous   Hyperlipidemia    past hx- resloved after gastric bypass    Hypertension    Hypothyroidism    IBS (irritable bowel syndrome)    Iron deficiency anemia    Osteoporosis    Past Surgical History:  Procedure Laterality Date   ABDOMINAL HYSTERECTOMY     ABDOMINAL HYSTERECTOMY     APPENDECTOMY     BLADDER REPAIR     BREAST BIOPSY Right    needle core biopsy, benign   CATARACT EXTRACTION Left    CHOLECYSTECTOMY     COLONOSCOPY     CORONARY ANGIOPLASTY     GASTRIC BYPASS      POLYPECTOMY     REVERSE SHOULDER ARTHROPLASTY Right 07/15/2020   Procedure: REVERSE SHOULDER ARTHROPLASTY;  Surgeon: Justice Britain, MD;  Location: [facility];  Service: Orthopedics;  Laterality: Right;  147min   TONSILLECTOMY AND ADENOIDECTOMY     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   Procedure: TOTAL KNEE ARTHROPLASTY;  Surgeon: Gaynelle Arabian, MD;  Location: [facility];  Service: Orthopedics;  Laterality: Left;   Social History   Tobacco Use   Smoking status: Former    Packs/day: 0.30    Years: 2.00    Total pack years: 0.60    Types: Cigarettes    Quit date: 07/11/1962    Years since quitting: 60.0   Smokeless tobacco: Never  Vaping Use   Vaping Use: Never used  Substance Use Topics   Alcohol use: Not Currently   Drug use: No   Social History   Socioeconomic History   Marital status: Single    Spouse name: Not on file   Number of children: 1   Years of education: Not on file   Highest education level: Not on file  Occupational History   Occupation: retired Product manager: RETIRED  Tobacco Use   Smoking status: Former    Packs/day: 0.30    Years: 2.00    Total pack years: 0.60    Types: Cigarettes    Quit date: 07/11/1962    Years since quitting: 60.0   Smokeless tobacco: Never  Vaping Use   Vaping Use: Never used  Substance and Sexual Activity   Alcohol use: Not Currently   Drug use: No   Sexual activity: Yes    Birth control/protection: Surgical    Comment: Hysterectomy  Other Topics Concern   Not on file  Social History Narrative   Daily caffeine   Social Determinants of Health   Financial Resource Strain: Low Risk  (02/15/2022)   Overall Financial Resource Strain (CARDIA)    Difficulty of Paying Living Expenses: Not hard at all  Food Insecurity: No Food Insecurity (02/15/2022)   Hunger Vital Sign    Worried About Running Out of Food in the Last Year: Never true    [place] in the Last Year: Never true  Transportation Needs: No Transportation  Needs (02/15/2022)   PRAPARE - Hydrologist (Medical): No    Lack of Transportation (Non-Medical): No  Physical Activity: Inactive (02/15/2022)   Exercise Vital Sign    Days of Exercise per Week: 0 days    Minutes of Exercise per Session: 0 min  Stress: No Stress Concern Present (02/15/2022)   [place]    Feeling of Stress : Not at all  Social Connections: Moderately Integrated (02/15/2022)  Social Licensed conveyancer [NHANES]    Frequency of Communication with Friends and Family: More than three times a week    Frequency of Social Gatherings with Friends and Family: More than three times a week    Attends Religious Services: More than 4 times per year    Active Member of [employer] or Organizations: Yes    Attends Music therapist: More than 4 times per year    Marital Status: Divorced  Intimate Partner Violence: Not At Risk (02/15/2022)   Humiliation, Afraid, Rape, and Kick questionnaire    Fear of Current or Ex-Partner: No    Emotionally Abused: No    Physically Abused: No    Sexually Abused: No   Family Status  Relation Name Status   Mother  Deceased   Father  Deceased   Sister  Deceased   MGM  Deceased   MGF  Deceased   PGM  Deceased   PGF  Deceased   Neg Hx  (Not Specified)   Family History  Problem Relation Age of Onset   Stroke Mother    Asthma Mother    Anuerysm Mother    Obesity Father    Diabetes Sister    Heart disease Maternal Grandmother    Angina Maternal Grandmother    Suicidality Maternal Grandfather    Cancer Paternal Grandmother    Colon cancer Neg Hx    Colon polyps Neg Hx    Esophageal cancer Neg Hx    Rectal cancer Neg Hx    Stomach cancer Neg Hx    Pancreatic cancer Neg Hx    Liver disease Neg Hx    Allergies  Allergen Reactions   Sulfonamide Derivatives Diarrhea   Cocaine     Other reaction(s): Hallucination Sinus  irrigation   Montelukast Sodium     UNKNOWN   Penicillins     Local skin reaction.  Tolerated ancef 07/15/2020 Tolerated Cephalosporin Date: 10/20/21.     Pioglitazone     UNKNOWN    Rosiglitazone Maleate     UNKNOWN   Secobarbital Sodium     UNKNOWN   Cefdinir Palpitations    Possible reaction    Codeine Rash   Doxycycline Nausea Only    REACTION: GI UPSET      Review of Systems  Constitutional:  Positive for malaise/fatigue. Negative for fever.  HENT:  Negative for congestion.   Eyes:  Negative for blurred vision.  Respiratory:  Negative for shortness of breath.   Cardiovascular:  Positive for leg swelling. Negative for chest pain and palpitations.  Gastrointestinal:  Negative for abdominal pain, blood in stool and nausea.  Genitourinary:  Negative for dysuria and frequency.  Musculoskeletal:  Negative for falls.  Skin:  Negative for rash.  Neurological:  Negative for dizziness, loss of consciousness and headaches.  Endo/Heme/Allergies:  Negative for environmental allergies. Bruises/bleeds easily.  Psychiatric/Behavioral:  Negative for depression. The patient is not nervous/anxious.       Objective:     BP 118/86 (BP Location: Left Arm, Patient Position: Sitting, Cuff Size: Normal)   Pulse 75   Temp 98.7 F (37.1 C) (Oral)   Resp 18   Ht 5' 7.5" (1.715 m)   Wt 165 lb 6.4 oz (75 kg)   SpO2 97%   BMI 25.52 kg/m  BP Readings from Last 3 Encounters:  07/30/22 118/86  06/08/22 128/72  03/26/22 100/70   Wt Readings from Last 3 Encounters:  07/30/22 165 lb 6.4 oz (75 kg)  06/08/22 166 lb 6.4 oz (75.5 kg)  03/26/22 163 lb 9.6 oz (74.2 kg)   SpO2 Readings from Last 3 Encounters:  07/30/22 97%  06/08/22 96%  03/26/22 100%      Physical Exam Vitals and nursing note reviewed.  Constitutional:      Appearance: She is well-developed.  HENT:     Head: Normocephalic and atraumatic.  Eyes:     Conjunctiva/sclera: Conjunctivae normal.  Neck:     Thyroid:  No thyromegaly.     Vascular: No carotid bruit or JVD.  Cardiovascular:     Rate and Rhythm: Normal rate and regular rhythm.     Heart sounds: Normal heart sounds. No murmur heard. Pulmonary:     Effort: Pulmonary effort is normal. No respiratory distress.     Breath sounds: Normal breath sounds. No wheezing or rales.  Chest:     Chest wall: No tenderness.  Musculoskeletal:        General: Swelling present.     Cervical back: Normal range of motion and neck supple.     Right lower leg: 1+ Pitting Edema present.     Left lower leg: 1+ Pitting Edema present.     Comments: + bruising of toes L>R  ? From shoes due to swelling  No pain  Normal sensation   Neurological:     Mental Status: She is alert and oriented to person, place, and time.      No results found for any visits on 07/30/22.  Last CBC Lab Results  Component Value Date   WBC 4.5 07/23/2022   HGB 10.8 (L) 07/23/2022   HCT 31.7 (L) 07/23/2022   MCV 100.3 (H) 07/23/2022   MCH 34.2 (H) 07/23/2022   RDW 12.2 07/23/2022   PLT 191 07/23/2022   Last metabolic panel Lab Results  Component Value Date   GLUCOSE 95 07/23/2022   NA 139 07/23/2022   K 4.3 07/23/2022   CL 104 07/23/2022   CO2 27 07/23/2022   BUN 10 07/23/2022   CREATININE 0.82 07/23/2022   EGFR 91 01/25/2022   CALCIUM 9.3 07/23/2022   PROT 6.2 07/23/2022   ALBUMIN 3.3 (L) 10/31/2021   LABGLOB 2.7 04/18/2017   AGRATIO 1.7 04/18/2017   BILITOT 0.5 07/23/2022   ALKPHOS 60 10/31/2021   AST 22 07/23/2022   ALT 18 07/23/2022   ANIONGAP 10 10/31/2021   Last lipids Lab Results  Component Value Date   CHOL 174 03/26/2022   HDL 51 03/26/2022   LDLCALC 96 03/26/2022   LDLDIRECT 147.0 05/28/2019   TRIG 172 (H) 03/26/2022   CHOLHDL 3.4 03/26/2022   Last hemoglobin A1c Lab Results  Component Value Date   HGBA1C 5.5 08/17/2021   Last thyroid functions Lab Results  Component Value Date   TSH 0.65 03/26/2022   T4TOTAL 10.8 06/11/2020   Last  vitamin D Lab Results  Component Value Date   VD25OH 32 07/23/2022   Last vitamin B12 and Folate Lab Results  Component Value Date   VITAMINB12 1,066 (H) 03/23/2021      The 10-year ASCVD risk score (Arnett DK, et al., 2019) is: 45.6%    Assessment & Plan:   Problem List Items Addressed This Visit       Unprioritized   Vitamin D deficiency   Relevant Medications   Vitamin D, Ergocalciferol, (DRISDOL) 1.25 MG (50000 UNIT) CAPS capsule   B12 deficiency   Relevant Orders   Vitamin B12   Depression with anxiety  Relevant Orders   CBC with Differential/Platelet   Comprehensive metabolic panel   TSH   Vitamin B12   VITAMIN D 25 Hydroxy (Vit-D Deficiency, Fractures)   IBC panel   ANEMIA-IRON DEFICIENCY - Primary   Relevant Orders   CBC with Differential/Platelet   IBC panel   Nut allergy    Allergy test con't antihistamine  Epi pen        Relevant Medications   EPINEPHrine (EPIPEN 2-PAK) 0.3 mg/0.3 mL IJ SOAJ injection   Other Relevant Orders   Allergens (95) Foods IgE   Hypothyroidism    Check labs con't synthroid      Hyperlipidemia LDL goal <100    Encourage heart healthy diet such as MIND or DASH diet, increase exercise, avoid trans fats, simple carbohydrates and processed foods, consider a krill or fish or flaxseed oil cap daily.       Relevant Medications   EPINEPHrine (EPIPEN 2-PAK) 0.3 mg/0.3 mL IJ SOAJ injection   Fatigue    Check labs  If labs normal she will discuss her meds with cardiology      Relevant Orders   CBC with Differential/Platelet   Comprehensive metabolic panel   TSH   Vitamin B12   VITAMIN D 25 Hydroxy (Vit-D Deficiency, Fractures)   IBC panel   Protime-INR   APTT   Essential hypertension    Well controlled, no changes to meds. Encouraged heart healthy diet such as the DASH diet and exercise as tolerated.       Relevant Medications   EPINEPHrine (EPIPEN 2-PAK) 0.3 mg/0.3 mL IJ SOAJ injection   Bee sting allergy     Epi pen       Relevant Medications   EPINEPHrine (EPIPEN 2-PAK) 0.3 mg/0.3 mL IJ SOAJ injection    No follow-ups on file.    [name], DO

## 2022-07-30 NOTE — Assessment & Plan Note (Signed)
Check labs con't synthroid 

## 2022-08-02 LAB — ALLERGENS (95) FOODS IGE
Allergen Apple, IgE: <0.1 kU/L
Allergen Banana IgE: <0.1 kU/L
Allergen Barley IgE: <0.1 kU/L
Allergen Black Pepper IgE: <0.1 kU/L
Allergen Blueberry IgE: <0.1 kU/L
Allergen Broccoli: <0.1 kU/L
Allergen Cabbage IgE: <0.1 kU/L
Allergen Carrot IgE: <0.1 kU/L
Allergen Cauliflower IgE: <0.1 kU/L
Allergen Celery IgE: <0.1 kU/L
Allergen Cinnamon IgE: <0.1 kU/L
Allergen Coconut IgE: <0.1 kU/L
Allergen Corn, IgE: <0.1 kU/L
Allergen Cucumber IgE: <0.1 kU/L
Allergen Garlic IgE: <0.1 kU/L
Allergen Ginger IgE: <0.1 kU/L
Allergen Gluten IgE: <0.1 kU/L
Allergen Grape IgE: <0.1 kU/L
Allergen Grapefruit IgE: <0.1 kU/L
Allergen Green Bean IgE: <0.1 kU/L
Allergen Green Pea IgE: <0.1 kU/L
Allergen Lamb IgE: <0.1 kU/L
Allergen Lettuce IgE: <0.1 kU/L
Allergen Lime IgE: <0.1 kU/L
Allergen Melon IgE: <0.1 kU/L
Allergen Oat IgE: <0.1 kU/L
Allergen Onion IgE: <0.1 kU/L
Allergen Pear IgE: <0.1 kU/L
Allergen Potato, White IgE: <0.1 kU/L
Allergen Rice IgE: <0.1 kU/L
Allergen Salmon IgE: <0.1 kU/L
Allergen Strawberry IgE: <0.1 kU/L
Allergen Sweet Potato IgE: <0.1 kU/L
Allergen Tomato, IgE: <0.1 kU/L
Allergen Turkey IgE: <0.1 kU/L
Allergen Watermelon IgE: <0.1 kU/L
Allergen, Peach f95: <0.1 kU/L
Basil: <0.1 kU/L
Beef IgE: <0.1 kU/L
C074-IgE Gelatin: <0.1 kU/L
Chicken IgE: <0.1 kU/L
Chocolate/Cacao IgE: <0.1 kU/L
Clam IgE: <0.1 kU/L
Codfish IgE: <0.1 kU/L
Coffee: <0.1 kU/L
Cranberry IgE: <0.1 kU/L
Egg White IgE: <0.1 kU/L
F020-IgE Almond: <0.1 kU/L
F023-IgE Crab: <0.1 kU/L
F045-IgE Yeast: <0.1 kU/L
F076-IgE Alpha Lactalbumin: <0.1 kU/L
F077-IgE Beta Lactoglobulin: <0.1 kU/L
F078-IgE Casein: <0.1 kU/L
F080-IgE Lobster: <0.1 kU/L
F081-IgE Cheese, Cheddar Type: <0.1 kU/L
F089-IgE Mustard: <0.1 kU/L
F096-IgE Avocado: <0.1 kU/L
F202-IgE Cashew Nut: <0.1 kU/L
F214-IgE Spinach: <0.1 kU/L
F222-IgE Tea: <0.1 kU/L
F242-IgE Bing Cherry: <0.1 kU/L
F261-IgE Asparagus: <0.1 kU/L
F262-IgE Eggplant: <0.1 kU/L
F265-IgE Cumin: <0.1 kU/L
F278-IgE Bayleaf (Laurel): <0.1 kU/L
F279-IgE Chili Pepper: <0.1 kU/L
F283-IgE Oregano: <0.1 kU/L
F300-IgE Goat's Milk: <0.1 kU/L
F342-IgE Olive, Black: <0.1 kU/L
F343-IgE Raspberry: <0.1 kU/L
Hops: <0.1 kU/L
IgE Egg (Yolk): <0.1 kU/L
Kidney Bean IgE: <0.1 kU/L
Lemon: <0.1 kU/L
Lima Bean IgE: <0.1 kU/L
Malt: <0.1 kU/L
Mushroom IgE: <0.1 kU/L
Orange: <0.1 kU/L
Paprika IgE: <0.1 kU/L
Peanut IgE: <0.1 kU/L
Pineapple IgE: <0.1 kU/L
Pork IgE: <0.1 kU/L
Pumpkin IgE: <0.1 kU/L
Red Beet: <0.1 kU/L
Rye IgE: <0.1 kU/L
Scallop IgE: <0.1 kU/L
Sesame Seed IgE: <0.1 kU/L
Shrimp IgE: <0.1 kU/L
Soybean IgE: <0.1 kU/L
Tuna: <0.1 kU/L
Vanilla: <0.1 kU/L
Walnut IgE: <0.1 kU/L
Wheat IgE: <0.1 kU/L
Whey: <0.1 kU/L
White Bean IgE: <0.1 kU/L

## 2022-08-03 ENCOUNTER — Ambulatory Visit: Payer: Medicare PPO | Attending: Internal Medicine | Admitting: Pharmacist

## 2022-08-03 ENCOUNTER — Telehealth: Payer: Self-pay | Admitting: Cardiology

## 2022-08-03 VITALS — BP 146/84 | HR 75

## 2022-08-03 DIAGNOSIS — M810 Age-related osteoporosis without current pathological fracture: Secondary | ICD-10-CM

## 2022-08-03 DIAGNOSIS — Z7689 Persons encountering health services in other specified circumstances: Secondary | ICD-10-CM

## 2022-08-03 MED ORDER — DENOSUMAB 60 MG/ML ~~LOC~~ SOSY
60.0000 mg | PREFILLED_SYRINGE | Freq: Once | SUBCUTANEOUS | Status: AC
  Administered 2022-08-03: 60 mg via SUBCUTANEOUS

## 2022-08-03 NOTE — Patient Instructions (Signed)
We will repeat labs at Dr. Marveen Reeks appointment in preparation for your next Prolia that will be due on 01/30/2023.

## 2022-08-03 NOTE — Telephone Encounter (Signed)
Pt c/o medication issue:  1. Name of Medication: propafenone (RYTHMOL SR) 225 MG 12 hr capsule  2. How are you currently taking this medication (dosage and times per day)? Take 1 capsule (225 mg total) by mouth 2 (two) times daily.  3. Are you having a reaction (difficulty breathing--STAT)? no  4. What is your medication issue? Patient reports she is extremely fatigued, she wants to know if she can stop taking this medication.

## 2022-08-04 MED ORDER — METOPROLOL SUCCINATE ER 25 MG PO TB24: 25.0000 mg | ORAL_TABLET | Freq: Two times a day (BID) | ORAL | 11 refills | Status: DC

## 2022-08-04 NOTE — Telephone Encounter (Signed)
The patient has called before about the fatigued and at the time she had a UTI. We thought that may have been the problem however the fatigued has been ongoing since April when see start the medication and has not gotten better even after the UTI resolved. In the last couple of weeks it has went from fatigue to extreme fatigued. She would like to know what he other treatment options are for Atach/SVT. No complains of palpitations. Notes went to PCP a week ago for this problem states they started her on Vit D. This has helped minimally with the fatigued.  Will forward to providers for advisement.

## 2022-08-04 NOTE — Telephone Encounter (Signed)
Patient verbalized understanding and agreement. Appointment made.

## 2022-08-04 NOTE — Telephone Encounter (Signed)
Stop propafenone.  Start metoprolol succinate '25mg'$  PO BID.   Follow up with APP in 3 months.   Lysbeth Galas T. Quentin Ore, MD, [facility], [facility]  Cardiac Electrophysiology

## 2022-08-09 ENCOUNTER — Ambulatory Visit (HOSPITAL_BASED_OUTPATIENT_CLINIC_OR_DEPARTMENT_OTHER)
Admission: RE | Admit: 2022-08-09 | Discharge: 2022-08-09 | Disposition: A | Discharge status: Home / Self Care | Payer: Medicare PPO | Source: Ambulatory Visit | Attending: Family Medicine | Admitting: Family Medicine

## 2022-08-09 ENCOUNTER — Encounter (HOSPITAL_BASED_OUTPATIENT_CLINIC_OR_DEPARTMENT_OTHER): Payer: Self-pay

## 2022-08-09 DIAGNOSIS — Z1231 Encounter for screening mammogram for malignant neoplasm of breast: Secondary | ICD-10-CM | POA: Insufficient documentation

## 2022-08-19 ENCOUNTER — Other Ambulatory Visit: Payer: Self-pay | Admitting: Family Medicine

## 2022-09-03 ENCOUNTER — Other Ambulatory Visit (HOSPITAL_BASED_OUTPATIENT_CLINIC_OR_DEPARTMENT_OTHER): Payer: Self-pay

## 2022-09-03 ENCOUNTER — Encounter: Payer: Medicare PPO | Admitting: Family Medicine

## 2022-09-03 MED ORDER — FLUAD QUADRIVALENT 0.5 ML IM PRSY
PREFILLED_SYRINGE | INTRAMUSCULAR | 0 refills | Status: DC
  Filled 2022-09-03: qty 0.5, 1d supply, fill #0

## 2022-10-25 ENCOUNTER — Encounter: Payer: Medicare PPO | Admitting: Family Medicine

## 2022-10-26 ENCOUNTER — Other Ambulatory Visit: Payer: Self-pay | Admitting: Family Medicine

## 2022-10-26 DIAGNOSIS — I10 Essential (primary) hypertension: Secondary | ICD-10-CM

## 2022-10-26 DIAGNOSIS — E039 Hypothyroidism, unspecified: Secondary | ICD-10-CM

## 2022-10-26 NOTE — Progress Notes (Unsigned)
Cardiology Office Note Date:  10/26/2022  Patient ID:  Stacy Ayers, Stacy Ayers [birth year], MRN [id number] PCP:  [name], DO  Cardiologist:  Dr. Johney Frame Electrophysiologist: Dr. Quentin Ore    Chief Complaint:  *** f/u med change  History of Present Illness: Stacy GUILBERT is a 81 y.o. female with history of HTN, hypothyroidism, anxiety, asthma, depression, obesity (s/p gartic bypass), SVT/ATach  She was referred to Dr. Johney Frame for palpitations, monitoring noted numerous episodes of SVT, 120's, echo noted LVEF 60-65%, Toprol changed to BID ('25mg'$  AM, '50mg'$  PM), with an extra for palps.  She last saw Dr. Johney Frame April 2023, c/w palpitations, felt ongoing fatigue post COVID moths earlier, though slowly improving.  Some orthostatic dizziness. Her EKG this day was an ectopic atrial rhythm Referred to EP  She saw Dr. Quentin Ore 03/10/22, daily palpitations, seemingly post knee surgery 09/2021, increased fatigue and dizziness with increase in Toprol. SVT felt to be an ATach, her metoprolol stopped and started on propafenone Scheduled for EKGs and EP APP f/u  F/u EKG was OK  May called with fatgue, emotional instability, not feeling herself, questioned the rhythmol, advised since she was also being treated for UTI let hat treat and clear qst and monitor symptoms.  I saw her 06/08/22 She is generally doing OK Remains fatigued, tired, wishes she had more energy. No CP When inside, in her home, she denies any SOB, no difficulties with ADLs, but when out and about, groceries, errands, she tends to feel breathless with the same level of exertion she does at home with laundry and so on. No near syncope or syncope Some dizziness with bending over or standing from sitting Infrequent fleeting palpitations with a sense of a missing beat followed by a couple flutters. EKG with stable intervals, ectopic rhythm, , not felt to be the etiology of her fatigue. SOB unusual/atypical of  cardiac Advised she d/w symptoms with her PMD  Sept 2023, pt called with ongoing fatigue, despite resolutio of her UTI, suspected the propafenone. Dr. Quentin Ore advised stop the propafenone and started Toprol '25mg'$  BID and f/u in 3 mo  *** better? *** palps, SVT? *** ablate?  AAD Hx Propafenone started April 2023 for an atrial tachycardia >> stopped Sept 2023 2/2 fatigue   Past Medical History:  Diagnosis Date   Abnormal liver function tests    Allergy    Anxiety    Arthritis    knees, lower back    Asthma    Cancer ([place])    Cataract    left eye growing cataract    Depression    Diabetes mellitus    had gastric bypass DM resolved-    Diverticulosis    GERD (gastroesophageal reflux disease)    Headache    Previous migraines   Heart murmur    History of colon polyps    adenomatous   Hyperlipidemia    past hx- resloved after gastric bypass    Hypertension    Hypothyroidism    IBS (irritable bowel syndrome)    Iron deficiency anemia    Osteoporosis     Past Surgical History:  Procedure Laterality Date   ABDOMINAL HYSTERECTOMY     ABDOMINAL HYSTERECTOMY     APPENDECTOMY     BLADDER REPAIR     BREAST BIOPSY Right    needle core biopsy, benign   CATARACT EXTRACTION Left    CHOLECYSTECTOMY     COLONOSCOPY     CORONARY ANGIOPLASTY  GASTRIC BYPASS     POLYPECTOMY     REVERSE SHOULDER ARTHROPLASTY Right 07/15/2020   Procedure: REVERSE SHOULDER ARTHROPLASTY;  Surgeon: Justice Britain, MD;  Location: [facility];  Service: Orthopedics;  Laterality: Right;  162mn   TONSILLECTOMY AND ADENOIDECTOMY     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   Procedure: TOTAL KNEE ARTHROPLASTY;  Surgeon: AGaynelle Arabian MD;  Location: [facility];  Service: Orthopedics;  Laterality: Left;    Current Outpatient Medications  Medication Sig Dispense Refill   acetaminophen (TYLENOL) 650 MG CR tablet Take 650 mg by mouth every 8 (eight) hours as needed for pain.     albuterol (PROVENTIL HFA) 108  (90 Base) MCG/ACT inhaler Inhale 2 puffs into the lungs every 6 (six) hours as needed. 1 each 3   denosumab (PROLIA) 60 MG/ML SOSY injection Inject 60 mg into the skin every 6 (six) months. Courier to rheum: [id number] [id number] Appt on 08/03/22 1 mL 1   diclofenac Sodium (VOLTAREN) 1 % GEL Apply 2 g topically 4 (four) times daily. Do not apply to open area, apply to knee pain area     EPINEPHrine (EPIPEN 2-PAK) 0.3 mg/0.3 mL IJ SOAJ injection Inject 0.3 mg into the muscle as needed for anaphylaxis. 1 each 1   ferrous sulfate 325 (65 FE) MG tablet Take 325 mg by mouth daily with breakfast.     guaiFENesin (MUCINEX) 600 MG 12 hr tablet Take 2 tablets (1,200 mg total) by mouth 2 (two) times daily. 30 tablet 2   influenza vaccine adjuvanted (FLUAD QUADRIVALENT) 0.5 ML injection Inject into the muscle. 0.5 mL 0   losartan (COZAAR) 50 MG tablet Take 1 tablet (50 mg total) by mouth daily. 90 tablet 0   metoprolol succinate (TOPROL XL) 25 MG 24 hr tablet Take 1 tablet (25 mg total) by mouth 2 (two) times daily after a meal. 60 tablet 11   metoprolol tartrate (LOPRESSOR) 25 MG tablet Take 1 tablet (25 mg total) by mouth 3 (three) times daily as needed (palpitations). 90 tablet 3   pantoprazole (PROTONIX) 40 MG tablet TAKE 1 TABLET ONCE DAILY. 90 tablet 3   SYNTHROID 137 MCG tablet TAKE 1 TABLET ONCE DAILY ON MONDAY, WEDNESDAY, AND FRIDAY. TAKE 150MCG DAILY ALL OTHER DAYS. 90 tablet 1   SYNTHROID 150 MCG tablet Take 1 tablet on Sun, Tues, Thurs, and Sat. 64 tablet 1   venlafaxine XR (EFFEXOR-XR) 75 MG 24 hr capsule TAKE 1 CAPSULE ONCE DAILY WITH BREAKFAST. 90 capsule 1   Vitamin D, Ergocalciferol, (DRISDOL) 1.25 MG (50000 UNIT) CAPS capsule Take 1 capsule (50,000 Units total) by mouth every 7 (seven) days. 12 capsule 0   No current facility-administered medications for this visit.    Allergies:   Sulfonamide derivatives, Cocaine, Montelukast sodium, Penicillins, Pioglitazone,  Rosiglitazone maleate, Secobarbital sodium, Cefdinir, Codeine, and Doxycycline   Social History:  The patient  reports that she quit smoking about 60 years ago. Her smoking use included cigarettes. She has a 0.60 pack-year smoking history. She has never used smokeless tobacco. She reports that she does not currently use alcohol. She reports that she does not use drugs.   Family History:  The patient's family history includes Angina in her maternal grandmother; Anuerysm in her mother; Asthma in her mother; Cancer in her paternal grandmother; Diabetes in her sister; Heart disease in her maternal grandmother; Obesity in her father; Stroke in her mother; Suicidality in her maternal grandfather.  ROS:  Please see  the history of present illness.    All other systems are reviewed and otherwise negative.   PHYSICAL EXAM:  VS:  There were no vitals taken for this visit. BMI: There is no height or weight on file to calculate BMI. Well nourished, well developed, in no acute distress HEENT: normocephalic, atraumatic Neck: no JVD, carotid bruits or masses Cardiac:  *** RRR; no significant murmurs, no rubs, or gallops Lungs:  *** CTA b/l, no wheezing, rhonchi or rales Abd: soft, nontender MS: no deformity or atrophy Ext: *** no edema Skin: warm and dry, no rash Neuro:  No gross deficits appreciated Psych: euthymic mood, full affect   EKG:  Done today and reviewed by myself shows  *** Ectopic perhaps low atrial rhythm, not new or her, stable intervals   Dec 2022 monitor Patch wear time was 13 days and 16 hours Predominant rhythm was NSR with average HR 72 Frequent episodes of SVT (1201) with longest lasting 80mn at rate 124bpm Rare VE, no significant pauses  12/15/2021: TTE 1. Left ventricular ejection fraction, by estimation, is 60 to 65%. The  left ventricle has normal function. The left ventricle has no regional  wall motion abnormalities. Left ventricular diastolic parameters were   normal.   2. Right ventricular systolic function is normal. The right ventricular  size is normal.   3. Left atrial size was mildly dilated.   4. The mitral valve is normal in structure. Mild mitral valve  regurgitation. No evidence of mitral stenosis.   5. The aortic valve is normal in structure. Aortic valve regurgitation is  not visualized. No aortic stenosis is present.   6. The inferior vena cava is normal in size with greater than 50%  respiratory variability, suggesting right atrial pressure of 3 mmHg.   Recent Labs: 11/02/2021: Magnesium 2.4 07/30/2022: ALT 21; BUN 8; Creatinine, Ser 0.65; Hemoglobin 11.2; Platelets 191.0; Potassium 4.3; Sodium 140; TSH 1.16  03/26/2022: Cholesterol 174; HDL 51; LDL Cholesterol (Calc) 96; Total CHOL/HDL Ratio 3.4; Triglycerides 172   CrCl cannot be calculated (Patient's most recent lab result is older than the maximum 21 days allowed.).   Wt Readings from Last 3 Encounters:  07/30/22 165 lb 6.4 oz (75 kg)  06/08/22 166 lb 6.4 oz (75.5 kg)  03/26/22 163 lb 9.6 oz (74.2 kg)     Other studies reviewed: Additional studies/records reviewed today include: summarized above  ASSESSMENT AND PLAN:  Atach Ectopic rhythm, rate is good ***   3. HTN *** Looks good  4. SOB *** DOE is unusual, does not sound anginal No signs of volume OL Does not feel SOB at home (?)    Disposition: ***  Current medicines are reviewed at length with the patient today.  The patient did not have any concerns regarding medicines.  SVenetia Night PA-C 10/26/2022 6:48 AM     [facility] [address] [place] [phone](office)  [phone](fax)

## 2022-10-27 ENCOUNTER — Ambulatory Visit: Payer: Medicare PPO | Attending: Physician Assistant | Admitting: Physician Assistant

## 2022-10-27 ENCOUNTER — Encounter: Payer: Self-pay | Admitting: Physician Assistant

## 2022-10-27 VITALS — BP 120/60 | HR 74 | Ht 67.0 in | Wt 172.0 lb

## 2022-10-27 DIAGNOSIS — I491 Atrial premature depolarization: Secondary | ICD-10-CM | POA: Diagnosis not present

## 2022-10-27 DIAGNOSIS — I4719 Other supraventricular tachycardia: Secondary | ICD-10-CM | POA: Diagnosis not present

## 2022-10-27 DIAGNOSIS — I10 Essential (primary) hypertension: Secondary | ICD-10-CM

## 2022-10-27 NOTE — Patient Instructions (Signed)
Medication Instructions:   Your physician recommends that you continue on your current medications as directed. Please refer to the Current Medication list given to you today.  *If you need a refill on your cardiac medications before your next appointment, please call your pharmacy*   Lab Work: [place]   If you have labs (blood work) drawn today and your tests are completely normal, you will receive your results only by: [place] (if you have MyChart) OR A paper copy in the mail If you have any lab test that is abnormal or we need to change your treatment, we will call you to review the results.   Testing/Procedures: NONE ORDERED  TODAY    Follow-Up: At [facility], you and your health needs are our priority.  As part of our continuing mission to provide you with exceptional heart care, we have created designated Provider Care Teams.  These Care Teams include your primary Cardiologist (physician) and Advanced Practice Providers (APPs -  Physician Assistants and Nurse Practitioners) who all work together to provide you with the care you need, when you need it.  We recommend signing up for the patient portal called "MyChart".  Sign up information is provided on this After Visit Summary.  MyChart is used to connect with patients for Virtual Visits (Telemedicine).  Patients are able to view lab/test results, encounter notes, upcoming appointments, etc.  Non-urgent messages can be sent to your provider as well.   To learn more about what you can do with MyChart, go to [email or web address].    Your next appointment:   6 month(s)  The format for your next appointment:   In Person  Provider:   You may see Vickie Epley, MD or one of the following Advanced Practice Providers on your designated Care Team:   Tommye Standard, [place] Legrand Como "Jonni Sanger" Chalmers Cater, [place]  Other Instructions   Important Information About Sugar

## 2022-11-02 ENCOUNTER — Other Ambulatory Visit (HOSPITAL_COMMUNITY)
Admission: RE | Admit: 2022-11-02 | Discharge: 2022-11-02 | Disposition: A | Discharge status: Home / Self Care | Payer: Medicare PPO | Source: Ambulatory Visit | Attending: Family Medicine | Admitting: Family Medicine

## 2022-11-02 ENCOUNTER — Ambulatory Visit (INDEPENDENT_AMBULATORY_CARE_PROVIDER_SITE_OTHER): Payer: Medicare PPO | Admitting: Family Medicine

## 2022-11-02 ENCOUNTER — Encounter: Payer: Self-pay | Admitting: Family Medicine

## 2022-11-02 VITALS — BP 120/70 | HR 64 | Temp 98.5°F | Resp 18 | Ht 67.0 in | Wt 172.6 lb

## 2022-11-02 DIAGNOSIS — M6530 Trigger finger, unspecified finger: Secondary | ICD-10-CM

## 2022-11-02 DIAGNOSIS — D508 Other iron deficiency anemias: Secondary | ICD-10-CM

## 2022-11-02 DIAGNOSIS — Z1159 Encounter for screening for other viral diseases: Secondary | ICD-10-CM | POA: Diagnosis not present

## 2022-11-02 DIAGNOSIS — Z0000 Encounter for general adult medical examination without abnormal findings: Secondary | ICD-10-CM | POA: Diagnosis not present

## 2022-11-02 DIAGNOSIS — R300 Dysuria: Secondary | ICD-10-CM

## 2022-11-02 DIAGNOSIS — I10 Essential (primary) hypertension: Secondary | ICD-10-CM

## 2022-11-02 DIAGNOSIS — F320 Major depressive disorder, single episode, mild: Secondary | ICD-10-CM

## 2022-11-02 DIAGNOSIS — F418 Other specified anxiety disorders: Secondary | ICD-10-CM | POA: Diagnosis not present

## 2022-11-02 DIAGNOSIS — E1139 Type 2 diabetes mellitus with other diabetic ophthalmic complication: Secondary | ICD-10-CM | POA: Diagnosis not present

## 2022-11-02 DIAGNOSIS — E1151 Type 2 diabetes mellitus with diabetic peripheral angiopathy without gangrene: Secondary | ICD-10-CM

## 2022-11-02 DIAGNOSIS — G5603 Carpal tunnel syndrome, bilateral upper limbs: Secondary | ICD-10-CM

## 2022-11-02 DIAGNOSIS — E039 Hypothyroidism, unspecified: Secondary | ICD-10-CM

## 2022-11-02 DIAGNOSIS — F1020 Alcohol dependence, uncomplicated: Secondary | ICD-10-CM | POA: Diagnosis not present

## 2022-11-02 DIAGNOSIS — E559 Vitamin D deficiency, unspecified: Secondary | ICD-10-CM | POA: Diagnosis not present

## 2022-11-02 DIAGNOSIS — E785 Hyperlipidemia, unspecified: Secondary | ICD-10-CM | POA: Diagnosis not present

## 2022-11-02 DIAGNOSIS — R32 Unspecified urinary incontinence: Secondary | ICD-10-CM

## 2022-11-02 DIAGNOSIS — E538 Deficiency of other specified B group vitamins: Secondary | ICD-10-CM

## 2022-11-02 DIAGNOSIS — Z8639 Personal history of other endocrine, nutritional and metabolic disease: Secondary | ICD-10-CM

## 2022-11-02 DIAGNOSIS — M810 Age-related osteoporosis without current pathological fracture: Secondary | ICD-10-CM

## 2022-11-02 DIAGNOSIS — E890 Postprocedural hypothyroidism: Secondary | ICD-10-CM

## 2022-11-02 LAB — POCT URINALYSIS DIPSTICK
Bilirubin, UA: NEGATIVE
Blood, UA: NEGATIVE
Glucose, UA: NEGATIVE
Ketones, UA: NEGATIVE
Leukocytes, UA: NEGATIVE
Nitrite, UA: NEGATIVE
Protein, UA: NEGATIVE
Spec Grav, UA: 1.015 (ref 1.010–1.025)
Urobilinogen, UA: 0.2 E.U./dL
pH, UA: 5 (ref 5.0–8.0)

## 2022-11-02 LAB — POCT UA - GLUCOSE/PROTEIN
Glucose, UA: NEGATIVE
Protein, UA: NEGATIVE

## 2022-11-02 MED ORDER — SYNTHROID 137 MCG PO TABS: ORAL_TABLET | ORAL | 3 refills | Status: DC

## 2022-11-02 MED ORDER — SYNTHROID 150 MCG PO TABS: ORAL_TABLET | ORAL | 1 refills | Status: DC

## 2022-11-02 NOTE — Progress Notes (Signed)
Subjective:   By signing my name below, I, [name], attest that this documentation has been prepared under the direction and in the presence of [name], DO. 11/02/2022   Patient ID: Stacy Ayers, female    DOB: 10-13-1941, 81 y.o.   MRN: [id number]  Chief Complaint  Patient presents with   Annual Exam    Pt states not fasting     HPI Patient is in today for a comprehensive physical exam.   She occasionally measures low blood sugars while measuring her blood sugars at home.  Lab Results  Component Value Date   HGBA1C 5.8 11/02/2022   Her hands fall asleep at night. She also has trigger finger in her right middle and ring finger in the morning after waking up. Her symptoms improve throughout the day. She had a history of trigger finger in her left hand and received injections which resolved her symptoms. She sleeps on her right side while sleeping.  She has mild pain on her shoulder that had a shoulder replacement procedure done on.  She has frequent urine leakage. She has tried physical therapy in the past to manage her symptoms. She has never tried medication to manage her symptoms but is interested in taking them. She also thinks she may have a UTI. She has itching in her vaginal area.   She complains of gasping occasionally while out shopping. She does not have theses symptoms any other time.  She is regularly receiving prolia injections at her rheumatologists office.  She denies having any fever, new moles, congestion, sore throat, new muscle pain, new joint pain, chest pain, cough, SOB, wheezing, n/v/d, constipation, blood in stool, dysuria, frequency, hematuria, or headaches at this time. She is inquiring if he should receive the RSV vaccine and is interested in receiving it. She is UTD on flu vaccines this year. She is UTD on the latest Covid-19 vaccine. She is due for tetanus and is interested in receiving it at her pharmacy. She has received both shingles  vaccines at her pharmacy.  She is UTD on vision care.    Past Medical History:  Diagnosis Date   Abnormal liver function tests    Allergy    Anxiety    Arthritis    knees, lower back    Asthma    Cancer ([place])    Cataract    left eye growing cataract    Depression    Diabetes mellitus    had gastric bypass DM resolved-    Diverticulosis    GERD (gastroesophageal reflux disease)    Headache    Previous migraines   Heart murmur    History of colon polyps    adenomatous   Hyperlipidemia    past hx- resloved after gastric bypass    Hypertension    Hypothyroidism    IBS (irritable bowel syndrome)    Iron deficiency anemia    Osteoporosis     Past Surgical History:  Procedure Laterality Date   ABDOMINAL HYSTERECTOMY     ABDOMINAL HYSTERECTOMY     APPENDECTOMY     BLADDER REPAIR     BREAST BIOPSY Right    needle core biopsy, benign   CATARACT EXTRACTION Left    CHOLECYSTECTOMY     COLONOSCOPY     CORONARY ANGIOPLASTY     GASTRIC BYPASS     POLYPECTOMY     REVERSE SHOULDER ARTHROPLASTY Right 07/15/2020   Procedure: REVERSE SHOULDER ARTHROPLASTY;  Surgeon: Justice Britain, MD;  Location: [facility];  Service: Orthopedics;  Laterality: Right;  151mn   TONSILLECTOMY AND ADENOIDECTOMY     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   Procedure: TOTAL KNEE ARTHROPLASTY;  Surgeon: AGaynelle Arabian MD;  Location: [facility];  Service: Orthopedics;  Laterality: Left;    Family History  Problem Relation Age of Onset   Stroke Mother    Asthma Mother    Anuerysm Mother    Obesity Father    Diabetes Sister    Heart disease Maternal Grandmother    Angina Maternal Grandmother    Suicidality Maternal Grandfather    Cancer Paternal Grandmother    Colon cancer Neg Hx    Colon polyps Neg Hx    Esophageal cancer Neg Hx    Rectal cancer Neg Hx    Stomach cancer Neg Hx    Pancreatic cancer Neg Hx    Liver disease Neg Hx     Social History   Socioeconomic History   Marital status:  Single    Spouse name: Not on file   Number of children: 1   Years of education: Not on file   Highest education level: Not on file  Occupational History   Occupation: retired tProduct manager RETIRED  Tobacco Use   Smoking status: Former    Packs/day: 0.30    Years: 2.00    Total pack years: 0.60    Types: Cigarettes    Quit date: 07/11/1962    Years since quitting: 60.3   Smokeless tobacco: Never  Vaping Use   Vaping Use: Never used  Substance and Sexual Activity   Alcohol use: Not Currently   Drug use: No   Sexual activity: Yes    Birth control/protection: Surgical    Comment: Hysterectomy  Other Topics Concern   Not on file  Social History Narrative   Daily caffeine   Social Determinants of Health   Financial Resource Strain: Low Risk  (02/15/2022)   Overall Financial Resource Strain (CARDIA)    Difficulty of Paying Living Expenses: Not hard at all  Food Insecurity: No Food Insecurity (02/15/2022)   Hunger Vital Sign    Worried About Running Out of Food in the Last Year: Never true    Ran Out of Food in the Last Year: Never true  Transportation Needs: No Transportation Needs (02/15/2022)   PRAPARE - THydrologist(Medical): No    Lack of Transportation (Non-Medical): No  Physical Activity: Inactive (02/15/2022)   Exercise Vital Sign    Days of Exercise per Week: 0 days    Minutes of Exercise per Session: 0 min  Stress: No Stress Concern Present (02/15/2022)   FD[place]el   Feeling of Stress : Not at all  Social Connections: Moderately Integrated (02/15/2022)   Social Connection and Isolation Panel [NHANES]    Frequency of Communication with Friends and Family: More than three times a week    Frequency of Social Gatherings with Friends and Family: More than three times a week    Attends Religious Services: More than 4 times per year    Active Member of [employer]  Organizations: Yes    Attends CArchivistMeetings: More than 4 times per year    Marital Status: Divorced  Intimate Partner Violence: Not At Risk (02/15/2022)   Humiliation, Afraid, Rape, and Kick questionnaire    Fear of Current or Ex-Partner: No    Emotionally Abused:  No    Physically Abused: No    Sexually Abused: No    Outpatient Medications Prior to Visit  Medication Sig Dispense Refill   acetaminophen (TYLENOL) 650 MG CR tablet Take 650 mg by mouth every 8 (eight) hours as needed for pain.     albuterol (PROVENTIL HFA) 108 (90 Base) MCG/ACT inhaler Inhale 2 puffs into the lungs every 6 (six) hours as needed. 1 each 3   denosumab (PROLIA) 60 MG/ML SOSY injection Inject 60 mg into the skin every 6 (six) months. Courier to rheum: [address] [place], [place] [address]. Appt on 08/03/22 1 mL 1   diclofenac Sodium (VOLTAREN) 1 % GEL Apply 2 g topically 4 (four) times daily. Do not apply to open area, apply to knee pain area     EPINEPHrine (EPIPEN 2-PAK) 0.3 mg/0.3 mL IJ SOAJ injection Inject 0.3 mg into the muscle as needed for anaphylaxis. 1 each 1   ferrous sulfate 325 (65 FE) MG tablet Take 325 mg by mouth daily with breakfast.     guaiFENesin (MUCINEX) 600 MG 12 hr tablet Take 2 tablets (1,200 mg total) by mouth 2 (two) times daily. 30 tablet 2   losartan (COZAAR) 50 MG tablet Take 1 tablet (50 mg total) by mouth daily. 90 tablet 0   metoprolol succinate (TOPROL XL) 25 MG 24 hr tablet Take 1 tablet (25 mg total) by mouth 2 (two) times daily after a meal. 60 tablet 11   metoprolol tartrate (LOPRESSOR) 25 MG tablet Take 1 tablet (25 mg total) by mouth 3 (three) times daily as needed (palpitations). 90 tablet 3   pantoprazole (PROTONIX) 40 MG tablet TAKE 1 TABLET ONCE DAILY. 90 tablet 3   venlafaxine XR (EFFEXOR-XR) 75 MG 24 hr capsule TAKE 1 CAPSULE ONCE DAILY WITH BREAKFAST. 90 capsule 1   Vitamin D, Ergocalciferol, (DRISDOL) 1.25 MG (50000 UNIT) CAPS capsule Take 1  capsule (50,000 Units total) by mouth every 7 (seven) days. 12 capsule 0   SYNTHROID 137 MCG tablet TAKE 1 TABLET ONCE DAILY ON MONDAY, WEDNESDAY, AND FRIDAY. TAKE 150MCG DAILY ALL OTHER DAYS. 90 tablet 0   SYNTHROID 150 MCG tablet Take 1 tablet on Sun, Tues, Thurs, and Sat. 64 tablet 1   influenza vaccine adjuvanted (FLUAD QUADRIVALENT) 0.5 ML injection Inject into the muscle. (Patient not taking: Reported on 11/02/2022) 0.5 mL 0   No facility-administered medications prior to visit.    Allergies  Allergen Reactions   Sulfonamide Derivatives Diarrhea   Cocaine     Other reaction(s): Hallucination Sinus irrigation   Montelukast Sodium     UNKNOWN   Penicillins     Local skin reaction.  Tolerated ancef 07/15/2020 Tolerated Cephalosporin Date: 10/20/21.     Pioglitazone     UNKNOWN    Rosiglitazone Maleate     UNKNOWN   Secobarbital Sodium     UNKNOWN   Cefdinir Palpitations    Possible reaction    Codeine Rash   Doxycycline Nausea Only    REACTION: GI UPSET    Review of Systems  Genitourinary:        (+)vaginal itching (+)urinary incontinence  Musculoskeletal:        (+)right 3rd and 4th trigger finger during the morning (+)shoulder pain   Neurological:  Positive for tingling (bilateral hands at night).       Objective:    Physical Exam Vitals and nursing note reviewed.  Constitutional:      General: She is not in  acute distress.    Appearance: She is well-developed.  HENT:     Right Ear: External ear normal.     Left Ear: External ear normal.     Nose: Nose normal.  Eyes:     Pupils: Pupils are equal, round, and reactive to light.  Cardiovascular:     Rate and Rhythm: Normal rate and regular rhythm.     Heart sounds: Normal heart sounds. No murmur heard. Pulmonary:     Effort: Pulmonary effort is normal. No respiratory distress.     Breath sounds: Normal breath sounds. No wheezing or rales.  Chest:     Chest wall: No tenderness.  Musculoskeletal:         General: No swelling or deformity.     Cervical back: Normal range of motion and neck supple.     Right lower leg: No edema.     Left lower leg: No edema.     Comments: Trigger finger R 3 and 4 finger   Neurological:     Mental Status: She is alert and oriented to person, place, and time.     Sensory: Sensory deficit present.     Comments: Numbness and tingling in both hands +trigger finger   Psychiatric:        Behavior: Behavior normal.        Thought Content: Thought content normal.        Judgment: Judgment normal.     BP 120/70 (BP Location: Left Arm, Patient Position: Sitting, Cuff Size: Normal)   Pulse 64   Temp 98.5 F (36.9 C) (Oral)   Resp 18   Ht _0  (1.702 m)   Wt 172 lb 9.6 oz (78.3 kg)   SpO2 97%   BMI 27.03 kg/m  Wt Readings from Last 3 Encounters:  11/02/22 172 lb 9.6 oz (78.3 kg)  10/27/22 172 lb (78 kg)  07/30/22 165 lb 6.4 oz (75 kg)    Diabetic Foot Exam - Simple   Simple Foot Form Diabetic Foot exam was performed with the following findings: Yes 11/02/2022  4:57 PM  Visual Inspection No deformities, no ulcerations, no other skin breakdown bilaterally: Yes Sensation Testing Intact to touch and monofilament testing bilaterally: Yes Pulse Check Posterior Tibialis and Dorsalis pulse intact bilaterally: Yes Comments    Lab Results  Component Value Date   WBC 6.0 11/02/2022   HGB 11.4 (L) 11/02/2022   HCT 32.4 (L) 11/02/2022   PLT 187.0 11/02/2022   GLUCOSE 93 11/02/2022   CHOL 158 11/02/2022   TRIG 96.0 11/02/2022   HDL 48.60 11/02/2022   LDLDIRECT 147.0 05/28/2019   LDLCALC 90 11/02/2022   ALT 15 11/02/2022   AST 18 11/02/2022   NA 136 11/02/2022   K 4.0 11/02/2022   CL 102 11/02/2022   CREATININE 0.59 11/02/2022   BUN 9 11/02/2022   CO2 29 11/02/2022   TSH 0.13 (L) 11/02/2022   INR 0.9 07/30/2022   HGBA1C 5.8 11/02/2022   MICROALBUR <0.7 11/02/2022    Lab Results  Component Value Date   TSH 0.13 (L) 11/02/2022   Lab  Results  Component Value Date   WBC 6.0 11/02/2022   HGB 11.4 (L) 11/02/2022   HCT 32.4 (L) 11/02/2022   MCV 97.0 11/02/2022   PLT 187.0 11/02/2022   Lab Results  Component Value Date   NA 136 11/02/2022   K 4.0 11/02/2022   CO2 29 11/02/2022   GLUCOSE 93 11/02/2022   BUN 9 11/02/2022  CREATININE 0.59 11/02/2022   BILITOT 0.5 11/02/2022   ALKPHOS 44 11/02/2022   AST 18 11/02/2022   ALT 15 11/02/2022   PROT 6.8 11/02/2022   ALBUMIN 4.4 11/02/2022   CALCIUM 9.4 11/02/2022   ANIONGAP 10 10/31/2021   EGFR 91 01/25/2022   GFR 85.71 11/02/2022   Lab Results  Component Value Date   CHOL 158 11/02/2022   Lab Results  Component Value Date   HDL 48.60 11/02/2022   Lab Results  Component Value Date   LDLCALC 90 11/02/2022   Lab Results  Component Value Date   TRIG 96.0 11/02/2022   Lab Results  Component Value Date   CHOLHDL 3 11/02/2022   Lab Results  Component Value Date   HGBA1C 5.8 11/02/2022       Assessment & Plan:  Preventative health care  Bilateral carpal tunnel syndrome Assessment & Plan: Wear splints and refer to hand surgeon  Orders: -     Ambulatory referral to Orthopedic Surgery  Trigger finger of right hand, unspecified finger Assessment & Plan: Refer to hand surgeon  Orders: -     Ambulatory referral to Orthopedic Surgery  Urinary incontinence, unspecified type -     POCT urinalysis dipstick -     Cervicovaginal ancillary only  Other iron deficiency anemia  Postablative hypothyroidism Assessment & Plan: Check labs    Vitamin D deficiency -     Vitamin B12 -     VITAMIN D 25 Hydroxy (Vit-D Deficiency, Fractures)  B12 deficiency -     Vitamin B12 -     VITAMIN D 25 Hydroxy (Vit-D Deficiency, Fractures)  Essential hypertension Assessment & Plan: Well controlled, no changes to meds. Encouraged heart healthy diet such as the DASH diet and exercise as tolerated.    Orders: -     Lipid panel -     Comprehensive metabolic  panel -     CBC with Differential/Platelet  Dysuria Assessment & Plan: Check ua Check labs   Orders: -     POCT UA - Glucose/Protein -     Cervicovaginal ancillary only  Hypothyroidism, unspecified type Assessment & Plan: Check labs   Orders: -     TSH -     Synthroid; Take 1 po MWF  Dispense: 90 tablet; Refill: 3 -     Synthroid; 1 po Sun, tues, thurs , and sat  Dispense: 90 tablet; Refill: 1  History of diet-controlled diabetes  Type 2 diabetes mellitus with other ophthalmic complication, without long-term current use of insulin (HCC) -     Microalbumin / creatinine urine ratio -     Hemoglobin A1c  Need for hepatitis C screening test -     Hepatitis C antibody  Uncomplicated alcohol dependence (HCC) Assessment & Plan: Sober 3 months   Major depressive disorder, single episode, mild (HCC)  Morbid obesity ([place])  Age-related osteoporosis without current pathological fracture Assessment & Plan: Cal and vita d  Bone density utd    Depression with anxiety Assessment & Plan: stable   DM (diabetes mellitus) type II, controlled, with peripheral vascular disorder ([place]) Assessment & Plan: Check labs     Hyperlipidemia LDL goal <100 Assessment & Plan: Encourage heart healthy diet such as MIND or DASH diet, increase exercise, avoid trans fats, simple carbohydrates and processed foods, consider a krill or fish or flaxseed oil cap daily.       Meds ordered this encounter  Medications   SYNTHROID 137 MCG tablet  Sig: Take 1 po MWF    Dispense:  90 tablet    Refill:  3   SYNTHROID 150 MCG tablet    Sig: 1 po Sun, tues, thurs , and sat    Dispense:  90 tablet    Refill:  1    I, [name], DO, personally preformed the services described in this documentation.  All medical record entries made by the scribe were at my direction and in my presence.  I have reviewed the chart and discharge instructions (if applicable) and agree that the record  reflects my personal performance and is accurate and complete. 11/02/2022   I,[name],acting as a scribe for [name], DO.,have documented all relevant documentation on the behalf of [name], DO,as directed by  [name], DO while in the presence of [name], DO.   [name], DO

## 2022-11-02 NOTE — Addendum Note (Signed)
Addended by: Janan Halter F on: 11/02/2022 05:07 PM   Modules accepted: Orders

## 2022-11-03 ENCOUNTER — Other Ambulatory Visit: Payer: Self-pay | Admitting: Family Medicine

## 2022-11-03 DIAGNOSIS — M6530 Trigger finger, unspecified finger: Secondary | ICD-10-CM | POA: Insufficient documentation

## 2022-11-03 DIAGNOSIS — E890 Postprocedural hypothyroidism: Secondary | ICD-10-CM

## 2022-11-03 LAB — CBC WITH DIFFERENTIAL/PLATELET
Basophils Absolute: 0 10*3/uL (ref 0.0–0.1)
Basophils Relative: 0.6 % (ref 0.0–3.0)
Eosinophils Absolute: 0.2 10*3/uL (ref 0.0–0.7)
Eosinophils Relative: 2.7 % (ref 0.0–5.0)
HCT: 32.4 % — ABNORMAL LOW (ref 36.0–46.0)
Hemoglobin: 11.4 g/dL — ABNORMAL LOW (ref 12.0–15.0)
Lymphocytes Relative: 41 % (ref 12.0–46.0)
Lymphs Abs: 2.5 10*3/uL (ref 0.7–4.0)
MCHC: 35.2 g/dL (ref 30.0–36.0)
MCV: 97 fl (ref 78.0–100.0)
Monocytes Absolute: 0.3 10*3/uL (ref 0.1–1.0)
Monocytes Relative: 4.6 % (ref 3.0–12.0)
Neutro Abs: 3.1 10*3/uL (ref 1.4–7.7)
Neutrophils Relative %: 51.1 % (ref 43.0–77.0)
Platelets: 187 10*3/uL (ref 150.0–400.0)
RBC: 3.34 Mil/uL — ABNORMAL LOW (ref 3.87–5.11)
RDW: 13.1 % (ref 11.5–15.5)
WBC: 6 10*3/uL (ref 4.0–10.5)

## 2022-11-03 LAB — COMPREHENSIVE METABOLIC PANEL
ALT: 15 U/L (ref 0–35)
AST: 18 U/L (ref 0–37)
Albumin: 4.4 g/dL (ref 3.5–5.2)
Alkaline Phosphatase: 44 U/L (ref 39–117)
BUN: 9 mg/dL (ref 6–23)
CO2: 29 mEq/L (ref 19–32)
Calcium: 9.4 mg/dL (ref 8.4–10.5)
Chloride: 102 mEq/L (ref 96–112)
Creatinine, Ser: 0.59 mg/dL (ref 0.40–1.20)
GFR: 85.71 mL/min (ref >60.00)
Glucose, Bld: 93 mg/dL (ref 70–99)
Potassium: 4 mEq/L (ref 3.5–5.1)
Sodium: 136 mEq/L (ref 135–145)
Total Bilirubin: 0.5 mg/dL (ref 0.2–1.2)
Total Protein: 6.8 g/dL (ref 6.0–8.3)

## 2022-11-03 LAB — MICROALBUMIN / CREATININE URINE RATIO
Creatinine,U: 26.3 mg/dL
Microalb Creat Ratio: 2.7 mg/g (ref 0.0–30.0)
Microalb, Ur: <0.7 mg/dL (ref 0.0–1.9)

## 2022-11-03 LAB — LIPID PANEL
Cholesterol: 158 mg/dL (ref 0–200)
HDL: 48.6 mg/dL (ref >39.00)
LDL Cholesterol: 90 mg/dL (ref 0–99)
NonHDL: 108.96
Total CHOL/HDL Ratio: 3
Triglycerides: 96 mg/dL (ref 0.0–149.0)
VLDL: 19.2 mg/dL (ref 0.0–40.0)

## 2022-11-03 LAB — VITAMIN B12: Vitamin B-12: 1142 pg/mL — ABNORMAL HIGH (ref 211–911)

## 2022-11-03 LAB — HEMOGLOBIN A1C: Hgb A1c MFr Bld: 5.8 % (ref 4.6–6.5)

## 2022-11-03 LAB — HEPATITIS C ANTIBODY: Hepatitis C Ab: NONREACTIVE

## 2022-11-03 LAB — TSH: TSH: 0.13 u[IU]/mL — ABNORMAL LOW (ref 0.35–5.50)

## 2022-11-03 LAB — VITAMIN D 25 HYDROXY (VIT D DEFICIENCY, FRACTURES): VITD: 73.9 ng/mL (ref 30.00–100.00)

## 2022-11-03 MED ORDER — SYNTHROID 137 MCG PO TABS: 137.0000 ug | ORAL_TABLET | Freq: Every day | ORAL | 0 refills | Status: DC

## 2022-11-03 NOTE — Assessment & Plan Note (Signed)
Check ua Check labs

## 2022-11-03 NOTE — Assessment & Plan Note (Signed)
Sober 3 months

## 2022-11-03 NOTE — Assessment & Plan Note (Signed)
Well controlled, no changes to meds. Encouraged heart healthy diet such as the DASH diet and exercise as tolerated.  °

## 2022-11-03 NOTE — Assessment & Plan Note (Signed)
Check labs 

## 2022-11-03 NOTE — Assessment & Plan Note (Signed)
Wear splints and refer to hand surgeon

## 2022-11-03 NOTE — Assessment & Plan Note (Signed)
stable °

## 2022-11-03 NOTE — Assessment & Plan Note (Signed)
Cal and vita d  Bone density utd

## 2022-11-03 NOTE — Assessment & Plan Note (Signed)
Refer to hand surgeon

## 2022-11-03 NOTE — Assessment & Plan Note (Signed)
Encourage heart healthy diet such as MIND or DASH diet, increase exercise, avoid trans fats, simple carbohydrates and processed foods, consider a krill or fish or flaxseed oil cap daily.  °

## 2022-11-03 NOTE — Addendum Note (Signed)
Addended byDamita Dunnings D on: 11/03/2022 02:44 PM   Modules accepted: Orders

## 2022-11-04 LAB — CERVICOVAGINAL ANCILLARY ONLY
Comment: NEGATIVE
Comment: NEGATIVE
Comment: NEGATIVE

## 2022-11-18 DIAGNOSIS — G5603 Carpal tunnel syndrome, bilateral upper limbs: Secondary | ICD-10-CM | POA: Diagnosis not present

## 2022-11-23 ENCOUNTER — Encounter: Payer: Self-pay | Admitting: Family Medicine

## 2022-11-23 ENCOUNTER — Ambulatory Visit: Payer: Medicare PPO | Admitting: Family Medicine

## 2022-11-23 VITALS — BP 130/66 | HR 70 | Temp 98.4°F | Ht 67.0 in | Wt 169.9 lb

## 2022-11-23 DIAGNOSIS — R0981 Nasal congestion: Secondary | ICD-10-CM | POA: Diagnosis not present

## 2022-11-23 DIAGNOSIS — H00015 Hordeolum externum left lower eyelid: Secondary | ICD-10-CM

## 2022-11-23 DIAGNOSIS — J019 Acute sinusitis, unspecified: Secondary | ICD-10-CM | POA: Diagnosis not present

## 2022-11-23 LAB — POC INFLUENZA A&B (BINAX/QUICKVUE)
Influenza A, POC: NEGATIVE
Influenza B, POC: NEGATIVE

## 2022-11-23 LAB — POC COVID19 BINAXNOW: SARS Coronavirus 2 Ag: NEGATIVE

## 2022-11-23 MED ORDER — AZITHROMYCIN 250 MG PO TABS: ORAL_TABLET | ORAL | 0 refills | Status: AC

## 2022-11-23 NOTE — Progress Notes (Signed)
Established Patient Office Visit  Subjective   Patient ID: Stacy Ayers, female    DOB: [birth year]  Age: 81 y.o. MRN: [id number]  Chief Complaint  Patient presents with   Eye Pain    Patient complains of left eye pain,    Nasal Congestion    Patient complains of nasal congestion, x1 week, Patient reports greenish nasal discharge, Tried Mucinex with little relief   Dizziness    Pateint reports dizziness, x2 days    HPI   Stacy Ayers is seen for the following issues:  Left lower eyelid discomfort.  Started last Thursday.  She has been applying warm compresses daily.  No drainage at this time.  No visual changes.  Mild tenderness  She also relates nasal congestion for over a week.  She has had some greenish nasal discharge and occasional bloody drainage past couple days.  Tried Mucinex with little relief.  She has had some fleeting dizziness which sounds like vertigo but very transient lasting only seconds early in the morning.  No acute hearing changes.  No headache.  No ataxia.  No focal weakness.  Past Medical History:  Diagnosis Date   Abnormal liver function tests    Allergy    Anxiety    Arthritis    knees, lower back    Asthma    Cancer ([place])    Cataract    left eye growing cataract    Depression    Diabetes mellitus    had gastric bypass DM resolved-    Diverticulosis    GERD (gastroesophageal reflux disease)    Headache    Previous migraines   Heart murmur    History of colon polyps    adenomatous   Hyperlipidemia    past hx- resloved after gastric bypass    Hypertension    Hypothyroidism    IBS (irritable bowel syndrome)    Iron deficiency anemia    Osteoporosis    Past Surgical History:  Procedure Laterality Date   ABDOMINAL HYSTERECTOMY     ABDOMINAL HYSTERECTOMY     APPENDECTOMY     BLADDER REPAIR     BREAST BIOPSY Right    needle core biopsy, benign   CATARACT EXTRACTION Left    CHOLECYSTECTOMY     COLONOSCOPY     CORONARY ANGIOPLASTY      GASTRIC BYPASS     POLYPECTOMY     REVERSE SHOULDER ARTHROPLASTY Right 07/15/2020   Procedure: REVERSE SHOULDER ARTHROPLASTY;  Surgeon: Justice Britain, MD;  Location: [facility];  Service: Orthopedics;  Laterality: Right;  169mn   TONSILLECTOMY AND ADENOIDECTOMY     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   Procedure: TOTAL KNEE ARTHROPLASTY;  Surgeon: AGaynelle Arabian MD;  Location: [facility];  Service: Orthopedics;  Laterality: Left;    reports that she quit smoking about 60 years ago. Her smoking use included cigarettes. She has a 0.60 pack-year smoking history. She has never used smokeless tobacco. She reports that she does not currently use alcohol. She reports that she does not use drugs. family history includes Angina in her maternal grandmother; Anuerysm in her mother; Asthma in her mother; Cancer in her paternal grandmother; Diabetes in her sister; Heart disease in her maternal grandmother; Obesity in her father; Stroke in her mother; Suicidality in her maternal grandfather. Allergies  Allergen Reactions   Sulfonamide Derivatives Diarrhea   Cocaine     Other reaction(s): Hallucination Sinus irrigation   Montelukast Sodium     UNKNOWN  Penicillins     Local skin reaction.  Tolerated ancef 07/15/2020 Tolerated Cephalosporin Date: 10/20/21.     Pioglitazone     UNKNOWN    Rosiglitazone Maleate     UNKNOWN   Secobarbital Sodium     UNKNOWN   Cefdinir Palpitations    Possible reaction    Codeine Rash   Doxycycline Nausea Only    REACTION: GI UPSET    Review of Systems  Constitutional:  Negative for chills and fever.  HENT:  Positive for congestion and sinus pain.   Eyes:  Positive for pain. Negative for blurred vision, double vision, photophobia and discharge.  Respiratory:  Negative for cough.   Neurological:  Negative for headaches.      Objective:     BP 130/66 (BP Location: Left Arm, Patient Position: Sitting, Cuff Size: Normal)   Pulse 70   Temp 98.4 F (36.9 C)  (Oral)   Ht '5\' 7"'$  (1.702 m)   Wt 169 lb 14.4 oz (77.1 kg)   SpO2 98%   BMI 26.61 kg/m    Physical Exam Vitals reviewed.  Constitutional:      Appearance: Normal appearance.  HENT:     Right Ear: Tympanic membrane normal.     Left Ear: Tympanic membrane normal.     Mouth/Throat:     Mouth: Mucous membranes are moist.     Pharynx: Oropharynx is clear. No posterior oropharyngeal erythema.  Eyes:     Comments: Stye left lower lid laterally along the inner lid.  No conjunctivitis changes.  Remainder of eye exam is normal.  Cardiovascular:     Rate and Rhythm: Normal rate and regular rhythm.  Pulmonary:     Effort: Pulmonary effort is normal.     Breath sounds: Normal breath sounds. No wheezing or rales.  Musculoskeletal:     Cervical back: Neck supple.  Lymphadenopathy:     Cervical: No cervical adenopathy.  Neurological:     Mental Status: She is alert.      Results for orders placed or performed in visit on 11/23/22  POC COVID-19  Result Value Ref Range   SARS Coronavirus 2 Ag Negative Negative  POC Influenza A&B(BINAX/QUICKVUE)  Result Value Ref Range   Influenza A, POC Negative Negative   Influenza B, POC Negative Negative      The 10-year ASCVD risk score (Arnett DK, et al., 2019) is: 52.3%    Assessment & Plan:   #1 stye left lower lid.  No signs of conjunctivitis.  Continue warm compresses several times daily.  Gentle pressure.  Explained these are usually self-limited and hopefully will be resolving within the next week.  #2 sinus congestive symptoms.  May be viral.  Influenza and COVID testing negative.    reviewed signs and symptoms of bacterial conjunctivitis.  She has many drug allergies including penicillin, cephalosporins, sulfa, and doxycycline.  We wrote for Zithromax to start only if she has any progressive facial pain, headache, or persistent sinusitis symptoms.  In the meantime, continue nasal irrigation with Nettie pot   No follow-ups on file.     Carolann Littler, MD

## 2022-11-25 DIAGNOSIS — G5603 Carpal tunnel syndrome, bilateral upper limbs: Secondary | ICD-10-CM | POA: Diagnosis not present

## 2022-11-27 ENCOUNTER — Other Ambulatory Visit: Payer: Self-pay | Admitting: Family Medicine

## 2022-11-27 DIAGNOSIS — F418 Other specified anxiety disorders: Secondary | ICD-10-CM

## 2022-12-01 DIAGNOSIS — G5603 Carpal tunnel syndrome, bilateral upper limbs: Secondary | ICD-10-CM | POA: Diagnosis not present

## 2022-12-02 DIAGNOSIS — M1711 Unilateral primary osteoarthritis, right knee: Secondary | ICD-10-CM | POA: Diagnosis not present

## 2022-12-02 DIAGNOSIS — Z96652 Presence of left artificial knee joint: Secondary | ICD-10-CM | POA: Diagnosis not present

## 2022-12-06 ENCOUNTER — Ambulatory Visit: Payer: Medicare PPO | Admitting: Family Medicine

## 2022-12-06 ENCOUNTER — Encounter: Payer: Self-pay | Admitting: Family Medicine

## 2022-12-06 VITALS — BP 108/60 | HR 75 | Temp 98.7°F | Resp 18 | Ht 67.0 in | Wt 169.2 lb

## 2022-12-06 DIAGNOSIS — J40 Bronchitis, not specified as acute or chronic: Secondary | ICD-10-CM | POA: Diagnosis not present

## 2022-12-06 DIAGNOSIS — R42 Dizziness and giddiness: Secondary | ICD-10-CM

## 2022-12-06 DIAGNOSIS — H6503 Acute serous otitis media, bilateral: Secondary | ICD-10-CM | POA: Diagnosis not present

## 2022-12-06 MED ORDER — PREDNISONE 10 MG PO TABS: ORAL_TABLET | ORAL | 0 refills | Status: DC

## 2022-12-06 NOTE — Progress Notes (Signed)
Subjective:   By signing my name below, I, Eugene Gavia, attest that this documentation has been prepared under the direction and in the presence of [name], DO 12/06/22   Patient ID: Stacy Ayers, female    DOB: 14-Feb-1941, 82 y.o.   MRN: [id number]  Chief Complaint  Patient presents with   Sinusitis    Pt states sxs are not better. Pt states unable to sing and having dizziness.   Follow-up    HPI Patient is in today for follow up.  She was seen by Dr. Carolann Littler on 11/23/22 for complaints of nasal congestion and nasal discharge at that time. She tested negative for flu A/B and COVID at that time. She was started on a z-pak as well.  Patient reports a persistent sensation of "swollen vocal cords" despite use of the z-pak. Her sinus drainage and congestion significantly improved with a Neti pot. She is using an antihistamine and Mucinex. She has Flonase but has not yet used it while she has had these symptoms. She states that her symptoms make it difficult for her to sing.  She reports needing her Albuterol inhaler typically only at night. Her symptoms do not keep her awake at night. She notes similar symptoms in the past for which she used steroids with good relief. She complains of secondary dizziness with standing and moving quickly.  Past Medical History:  Diagnosis Date   Abnormal liver function tests    Allergy    Anxiety    Arthritis    knees, lower back    Asthma    Cancer ([place])    Cataract    left eye growing cataract    Depression    Diabetes mellitus    had gastric bypass DM resolved-    Diverticulosis    GERD (gastroesophageal reflux disease)    Headache    Previous migraines   Heart murmur    History of colon polyps    adenomatous   Hyperlipidemia    past hx- resloved after gastric bypass    Hypertension    Hypothyroidism    IBS (irritable bowel syndrome)    Iron deficiency anemia    Osteoporosis     Past Surgical History:   Procedure Laterality Date   ABDOMINAL HYSTERECTOMY     ABDOMINAL HYSTERECTOMY     APPENDECTOMY     BLADDER REPAIR     BREAST BIOPSY Right    needle core biopsy, benign   CATARACT EXTRACTION Left    CHOLECYSTECTOMY     COLONOSCOPY     CORONARY ANGIOPLASTY     GASTRIC BYPASS     POLYPECTOMY     REVERSE SHOULDER ARTHROPLASTY Right 07/15/2020   Procedure: REVERSE SHOULDER ARTHROPLASTY;  Surgeon: Justice Britain, MD;  Location: [facility];  Service: Orthopedics;  Laterality: Right;  1102mn   TONSILLECTOMY AND ADENOIDECTOMY     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   Procedure: TOTAL KNEE ARTHROPLASTY;  Surgeon: AGaynelle Arabian MD;  Location: [facility];  Service: Orthopedics;  Laterality: Left;    Family History  Problem Relation Age of Onset   Stroke Mother    Asthma Mother    Anuerysm Mother    Obesity Father    Diabetes Sister    Heart disease Maternal Grandmother    Angina Maternal Grandmother    Suicidality Maternal Grandfather    Cancer Paternal Grandmother    Colon cancer Neg Hx    Colon polyps Neg Hx  Esophageal cancer Neg Hx    Rectal cancer Neg Hx    Stomach cancer Neg Hx    Pancreatic cancer Neg Hx    Liver disease Neg Hx     Social History   Socioeconomic History   Marital status: Single    Spouse name: Not on file   Number of children: 1   Years of education: Not on file   Highest education level: Not on file  Occupational History   Occupation: retired Product manager: RETIRED  Tobacco Use   Smoking status: Former    Packs/day: 0.30    Years: 2.00    Total pack years: 0.60    Types: Cigarettes    Quit date: 07/11/1962    Years since quitting: 60.4   Smokeless tobacco: Never  Vaping Use   Vaping Use: Never used  Substance and Sexual Activity   Alcohol use: Not Currently   Drug use: No   Sexual activity: Yes    Birth control/protection: Surgical    Comment: Hysterectomy  Other Topics Concern   Not on file  Social History Narrative   Daily  caffeine   Social Determinants of Health   Financial Resource Strain: Low Risk  (02/15/2022)   Overall Financial Resource Strain (CARDIA)    Difficulty of Paying Living Expenses: Not hard at all  Food Insecurity: No Food Insecurity (02/15/2022)   Hunger Vital Sign    Worried About Running Out of Food in the Last Year: Never true    [place] in the Last Year: Never true  Transportation Needs: No Transportation Needs (02/15/2022)   PRAPARE - Hydrologist (Medical): No    Lack of Transportation (Non-Medical): No  Physical Activity: Inactive (02/15/2022)   Exercise Vital Sign    Days of Exercise per Week: 0 days    Minutes of Exercise per Session: 0 min  Stress: No Stress Concern Present (02/15/2022)   [place]    Feeling of Stress : Not at all  Social Connections: Moderately Integrated (02/15/2022)   Social Connection and Isolation Panel [NHANES]    Frequency of Communication with Friends and Family: More than three times a week    Frequency of Social Gatherings with Friends and Family: More than three times a week    Attends Religious Services: More than 4 times per year    Active Member of [employer] or Organizations: Yes    Attends Music therapist: More than 4 times per year    Marital Status: Divorced  Intimate Partner Violence: Not At Risk (02/15/2022)   Humiliation, Afraid, Rape, and Kick questionnaire    Fear of Current or Ex-Partner: No    Emotionally Abused: No    Physically Abused: No    Sexually Abused: No    Outpatient Medications Prior to Visit  Medication Sig Dispense Refill   acetaminophen (TYLENOL) 650 MG CR tablet Take 650 mg by mouth every 8 (eight) hours as needed for pain.     albuterol (PROVENTIL HFA) 108 (90 Base) MCG/ACT inhaler Inhale 2 puffs into the lungs every 6 (six) hours as needed. 1 each 3   denosumab (PROLIA) 60 MG/ML SOSY injection Inject 60  mg into the skin every 6 (six) months. Courier to rheum: [address] [place], [place] [address]. Appt on 08/03/22 1 mL 1   diclofenac Sodium (VOLTAREN) 1 % GEL Apply 2 g topically 4 (four)  times daily. Do not apply to open area, apply to knee pain area     EPINEPHrine (EPIPEN 2-PAK) 0.3 mg/0.3 mL IJ SOAJ injection Inject 0.3 mg into the muscle as needed for anaphylaxis. 1 each 1   ferrous sulfate 325 (65 FE) MG tablet Take 325 mg by mouth daily with breakfast.     guaiFENesin (MUCINEX) 600 MG 12 hr tablet Take 2 tablets (1,200 mg total) by mouth 2 (two) times daily. 30 tablet 2   losartan (COZAAR) 50 MG tablet Take 1 tablet (50 mg total) by mouth daily. 90 tablet 0   metoprolol succinate (TOPROL XL) 25 MG 24 hr tablet Take 1 tablet (25 mg total) by mouth 2 (two) times daily after a meal. 60 tablet 11   metoprolol tartrate (LOPRESSOR) 25 MG tablet Take 1 tablet (25 mg total) by mouth 3 (three) times daily as needed (palpitations). 90 tablet 3   pantoprazole (PROTONIX) 40 MG tablet TAKE 1 TABLET ONCE DAILY. 90 tablet 3   SYNTHROID 137 MCG tablet Take 1 tablet (137 mcg total) by mouth daily before breakfast. 90 tablet 0   venlafaxine XR (EFFEXOR-XR) 75 MG 24 hr capsule TAKE 1 CAPSULE ONCE DAILY WITH BREAKFAST. 90 capsule 1   Vitamin D, Ergocalciferol, (DRISDOL) 1.25 MG (50000 UNIT) CAPS capsule Take 1 capsule (50,000 Units total) by mouth every 7 (seven) days. 12 capsule 0   No facility-administered medications prior to visit.    Allergies  Allergen Reactions   Sulfonamide Derivatives Diarrhea   Cocaine     Other reaction(s): Hallucination Sinus irrigation   Montelukast Sodium     UNKNOWN   Penicillins     Local skin reaction.  Tolerated ancef 07/15/2020 Tolerated Cephalosporin Date: 10/20/21.     Pioglitazone     UNKNOWN    Rosiglitazone Maleate     UNKNOWN   Secobarbital Sodium     UNKNOWN   Cefdinir Palpitations    Possible reaction    Codeine Rash   Doxycycline Nausea  Only    REACTION: GI UPSET    Review of Systems  Constitutional:  Negative for chills and fever.  HENT:  Negative for congestion, nosebleeds and tinnitus.   Eyes:  Negative for blurred vision and pain.  Respiratory:  Negative for cough, hemoptysis, shortness of breath and stridor.   Cardiovascular:  Negative for chest pain, palpitations, orthopnea, claudication, leg swelling and PND.  Gastrointestinal:  Negative for blood in stool, diarrhea, nausea and vomiting.  Genitourinary:  Negative for dysuria and hematuria.  Musculoskeletal:  Negative for falls.  Neurological:  Positive for dizziness. Negative for loss of consciousness and headaches.  Psychiatric/Behavioral:  Negative for depression, hallucinations and substance abuse. The patient does not have insomnia.        Objective:    Physical Exam Vitals and nursing note reviewed.  Constitutional:      General: She is not in acute distress.    Appearance: Normal appearance. She is not ill-appearing.  HENT:     Head: Normocephalic and atraumatic.     Right Ear: External ear normal.     Left Ear: External ear normal.  Eyes:     Extraocular Movements: Extraocular movements intact.     Pupils: Pupils are equal, round, and reactive to light.  Cardiovascular:     Rate and Rhythm: Normal rate and regular rhythm.     Heart sounds: Normal heart sounds. No murmur heard.    No gallop.  Pulmonary:     Effort: Pulmonary  effort is normal. No respiratory distress.     Breath sounds: Normal breath sounds. No wheezing or rales.  Skin:    General: Skin is warm and dry.  Neurological:     Mental Status: She is alert and oriented to person, place, and time.  Psychiatric:        Mood and Affect: Mood normal.        Behavior: Behavior normal.        Thought Content: Thought content normal.        Judgment: Judgment normal.     BP 108/60 (BP Location: Left Arm, Patient Position: Sitting, Cuff Size: Normal)   Pulse 75   Temp 98.7 F (37.1  C) (Oral)   Resp 18   Ht '5\' 7"'$  (1.702 m)   Wt 169 lb 3.2 oz (76.7 kg)   SpO2 98%   BMI 26.50 kg/m  Wt Readings from Last 3 Encounters:  12/06/22 169 lb 3.2 oz (76.7 kg)  11/23/22 169 lb 14.4 oz (77.1 kg)  11/02/22 172 lb 9.6 oz (78.3 kg)       Assessment & Plan:  Bronchitis -     predniSONE; TAKE 3 TABLETS PO QD FOR 3 DAYS THEN TAKE 2 TABLETS PO QD FOR 3 DAYS THEN TAKE 1 TABLET PO QD FOR 3 DAYS THEN TAKE 1/2 TAB PO QD FOR 3 DAYS  Dispense: 20 tablet; Refill: 0  Vertigo  Non-recurrent acute serous otitis media of both ears     I,Alexis Herring,acting as a scribe for [name], DO.,have documented all relevant documentation on the behalf of [name], DO,as directed by  [name], DO while in the presence of [place], DO, personally preformed the services described in this documentation.  All medical record entries made by the scribe were at my direction and in my presence.  I have reviewed the chart and discharge instructions (if applicable) and agree that the record reflects my personal performance and is accurate and complete. 12/06/22   [name], DO

## 2022-12-06 NOTE — Patient Instructions (Signed)
Eustachian Tube Dysfunction  Eustachian tube dysfunction refers to a condition in which a blockage develops in the narrow passage that connects the middle ear to the back of the nose (eustachian tube). The eustachian tube regulates air pressure in the middle ear by letting air move between the ear and nose. It also helps to drain fluid from the middle ear space. Eustachian tube dysfunction can affect one or both ears. When the eustachian tube does not function properly, air pressure, fluid, or both can build up in the middle ear. What are the causes? This condition occurs when the eustachian tube becomes blocked or cannot open normally. Common causes of this condition include: Ear infections. Colds and other infections that affect the nose, mouth, and throat (upper respiratory tract). Allergies. Irritation from cigarette smoke. Irritation from stomach acid coming up into the esophagus (gastroesophageal reflux). The esophagus is the part of the body that moves food from the mouth to the stomach. Sudden changes in air pressure, such as from descending in an airplane or scuba diving. Abnormal growths in the nose or throat, such as: Growths that line the nose (nasal polyps). Abnormal growth of cells (tumors). Enlarged tissue at the back of the throat (adenoids). What increases the risk? You are more likely to develop this condition if: You smoke. You are overweight. You are a child who has: Certain birth defects of the mouth, such as cleft palate. Large tonsils or adenoids. What are the signs or symptoms? Common symptoms of this condition include: A feeling of fullness in the ear. Ear pain. Clicking or popping noises in the ear. Ringing in the ear (tinnitus). Hearing loss. Loss of balance. Dizziness. Symptoms may get worse when the air pressure around you changes, such as when you travel to an area of high elevation, fly on an airplane, or go scuba diving. How is this diagnosed? This  condition may be diagnosed based on: Your symptoms. A physical exam of your ears, nose, and throat. Tests, such as those that measure: The movement of your eardrum. Your hearing (audiometry). How is this treated? Treatment depends on the cause and severity of your condition. In mild cases, you may relieve your symptoms by moving air into your ears. This is called "popping the ears." In more severe cases, or if you have symptoms of fluid in your ears, treatment may include: Medicines to relieve congestion (decongestants). Medicines that treat allergies (antihistamines). Nasal sprays or ear drops that contain medicines that reduce swelling (steroids). A procedure to drain the fluid in your eardrum. In this procedure, a small tube may be placed in the eardrum to: Drain the fluid. Restore the air in the middle ear space. A procedure to insert a balloon device through the nose to inflate the opening of the eustachian tube (balloon dilation). Follow these instructions at home: Lifestyle Do not do any of the following until your health care provider approves: Travel to high altitudes. Fly in airplanes. Work in a pressurized cabin or room. Scuba dive. Do not use any products that contain nicotine or tobacco. These products include cigarettes, chewing tobacco, and vaping devices, such as e-cigarettes. If you need help quitting, ask your health care provider. Keep your ears dry. Wear fitted earplugs during showering and bathing. Dry your ears completely after. General instructions Take over-the-counter and prescription medicines only as told by your health care provider. Use techniques to help pop your ears as recommended by your health care provider. These may include: Chewing gum. Yawning. Frequent, forceful swallowing.   Closing your mouth, holding your nose closed, and gently blowing as if you are trying to blow air out of your nose. Keep all follow-up visits. This is important. Contact a  health care provider if: Your symptoms do not go away after treatment. Your symptoms come back after treatment. You are unable to pop your ears. You have: A fever. Pain in your ear. Pain in your head or neck. Fluid draining from your ear. Your hearing suddenly changes. You become very dizzy. You lose your balance. Get help right away if: You have a sudden, severe increase in any of your symptoms. Summary Eustachian tube dysfunction refers to a condition in which a blockage develops in the eustachian tube. It can be caused by ear infections, allergies, inhaled irritants, or abnormal growths in the nose or throat. Symptoms may include ear pain or fullness, hearing loss, or ringing in the ears. Mild cases are treated with techniques to unblock the ears, such as yawning or chewing gum. More severe cases are treated with medicines or procedures. This information is not intended to replace advice given to you by your health care provider. Make sure you discuss any questions you have with your health care provider. Document Revised: 01/26/2021 Document Reviewed: 01/26/2021 Elsevier Patient Education  2023 [employer]  

## 2022-12-10 DIAGNOSIS — M65331 Trigger finger, right middle finger: Secondary | ICD-10-CM | POA: Diagnosis not present

## 2022-12-10 DIAGNOSIS — M65341 Trigger finger, right ring finger: Secondary | ICD-10-CM | POA: Diagnosis not present

## 2022-12-10 DIAGNOSIS — G5602 Carpal tunnel syndrome, left upper limb: Secondary | ICD-10-CM | POA: Diagnosis not present

## 2022-12-21 ENCOUNTER — Other Ambulatory Visit: Payer: Self-pay | Admitting: Family Medicine

## 2022-12-23 DIAGNOSIS — G5602 Carpal tunnel syndrome, left upper limb: Secondary | ICD-10-CM | POA: Diagnosis not present

## 2023-01-05 ENCOUNTER — Other Ambulatory Visit: Payer: Self-pay

## 2023-01-07 ENCOUNTER — Other Ambulatory Visit (HOSPITAL_COMMUNITY): Payer: Self-pay

## 2023-01-10 ENCOUNTER — Other Ambulatory Visit (HOSPITAL_COMMUNITY): Payer: Self-pay

## 2023-01-13 ENCOUNTER — Other Ambulatory Visit: Payer: Self-pay

## 2023-01-14 ENCOUNTER — Encounter: Payer: Self-pay | Admitting: Family Medicine

## 2023-01-14 ENCOUNTER — Ambulatory Visit: Payer: Medicare PPO | Admitting: Family Medicine

## 2023-01-14 ENCOUNTER — Other Ambulatory Visit: Payer: Self-pay | Admitting: Family Medicine

## 2023-01-14 VITALS — BP 118/70 | HR 75 | Temp 98.7°F | Resp 18 | Ht 67.0 in | Wt 171.4 lb

## 2023-01-14 DIAGNOSIS — E559 Vitamin D deficiency, unspecified: Secondary | ICD-10-CM | POA: Diagnosis not present

## 2023-01-14 DIAGNOSIS — E890 Postprocedural hypothyroidism: Secondary | ICD-10-CM

## 2023-01-14 DIAGNOSIS — F1020 Alcohol dependence, uncomplicated: Secondary | ICD-10-CM

## 2023-01-14 DIAGNOSIS — F418 Other specified anxiety disorders: Secondary | ICD-10-CM

## 2023-01-14 DIAGNOSIS — E1139 Type 2 diabetes mellitus with other diabetic ophthalmic complication: Secondary | ICD-10-CM

## 2023-01-14 DIAGNOSIS — R413 Other amnesia: Secondary | ICD-10-CM | POA: Insufficient documentation

## 2023-01-14 DIAGNOSIS — I10 Essential (primary) hypertension: Secondary | ICD-10-CM

## 2023-01-14 DIAGNOSIS — R42 Dizziness and giddiness: Secondary | ICD-10-CM

## 2023-01-14 DIAGNOSIS — E785 Hyperlipidemia, unspecified: Secondary | ICD-10-CM

## 2023-01-14 DIAGNOSIS — R519 Headache, unspecified: Secondary | ICD-10-CM | POA: Diagnosis not present

## 2023-01-14 DIAGNOSIS — E538 Deficiency of other specified B group vitamins: Secondary | ICD-10-CM

## 2023-01-14 LAB — CBC WITH DIFFERENTIAL/PLATELET
Basophils Absolute: 0 10*3/uL (ref 0.0–0.1)
Basophils Relative: 0.9 % (ref 0.0–3.0)
Eosinophils Absolute: 0.1 10*3/uL (ref 0.0–0.7)
Eosinophils Relative: 1.9 % (ref 0.0–5.0)
HCT: 33.7 % — ABNORMAL LOW (ref 36.0–46.0)
Hemoglobin: 11.6 g/dL — ABNORMAL LOW (ref 12.0–15.0)
Lymphocytes Relative: 32.3 % (ref 12.0–46.0)
Lymphs Abs: 1.6 10*3/uL (ref 0.7–4.0)
MCHC: 34.4 g/dL (ref 30.0–36.0)
MCV: 98 fl (ref 78.0–100.0)
Monocytes Absolute: 0.4 10*3/uL (ref 0.1–1.0)
Monocytes Relative: 7.2 % (ref 3.0–12.0)
Neutro Abs: 2.9 10*3/uL (ref 1.4–7.7)
Neutrophils Relative %: 57.7 % (ref 43.0–77.0)
Platelets: 224 10*3/uL (ref 150.0–400.0)
RBC: 3.44 Mil/uL — ABNORMAL LOW (ref 3.87–5.11)
RDW: 14 % (ref 11.5–15.5)
WBC: 5.1 10*3/uL (ref 4.0–10.5)

## 2023-01-14 LAB — VITAMIN D 25 HYDROXY (VIT D DEFICIENCY, FRACTURES): VITD: 41.99 ng/mL (ref 30.00–100.00)

## 2023-01-14 LAB — COMPREHENSIVE METABOLIC PANEL
ALT: 15 U/L (ref 0–35)
AST: 16 U/L (ref 0–37)
Albumin: 4.2 g/dL (ref 3.5–5.2)
Alkaline Phosphatase: 49 U/L (ref 39–117)
BUN: 13 mg/dL (ref 6–23)
CO2: 31 mEq/L (ref 19–32)
Calcium: 9.8 mg/dL (ref 8.4–10.5)
Chloride: 102 mEq/L (ref 96–112)
Creatinine, Ser: 0.66 mg/dL (ref 0.40–1.20)
GFR: 83.31 mL/min (ref >60.00)
Glucose, Bld: 101 mg/dL — ABNORMAL HIGH (ref 70–99)
Potassium: 4.7 mEq/L (ref 3.5–5.1)
Sodium: 139 mEq/L (ref 135–145)
Total Bilirubin: 0.6 mg/dL (ref 0.2–1.2)
Total Protein: 6.5 g/dL (ref 6.0–8.3)

## 2023-01-14 LAB — SEDIMENTATION RATE: Sed Rate: 10 mm/hr (ref 0–30)

## 2023-01-14 LAB — LIPID PANEL
Cholesterol: 166 mg/dL (ref 0–200)
HDL: 42.1 mg/dL (ref >39.00)
LDL Cholesterol: 98 mg/dL (ref 0–99)
NonHDL: 124.24
Total CHOL/HDL Ratio: 4
Triglycerides: 131 mg/dL (ref 0.0–149.0)
VLDL: 26.2 mg/dL (ref 0.0–40.0)

## 2023-01-14 LAB — VITAMIN B12: Vitamin B-12: >1500 pg/mL — ABNORMAL HIGH (ref 211–911)

## 2023-01-14 LAB — TSH: TSH: 0.28 u[IU]/mL — ABNORMAL LOW (ref 0.35–5.50)

## 2023-01-14 MED ORDER — VENLAFAXINE HCL ER 150 MG PO CP24: 150.0000 mg | ORAL_CAPSULE | Freq: Every day | ORAL | 1 refills | Status: DC

## 2023-01-14 NOTE — Assessment & Plan Note (Signed)
Refer neuropsychology

## 2023-01-14 NOTE — Assessment & Plan Note (Signed)
hgba1c to be checked minimize simple carbs. Increase exercise as tolerated. Continue current meds 

## 2023-01-14 NOTE — Assessment & Plan Note (Signed)
Encourage heart healthy diet such as MIND or DASH diet, increase exercise, avoid trans fats, simple carbohydrates and processed foods, consider a krill or fish or flaxseed oil cap daily.  °

## 2023-01-14 NOTE — Assessment & Plan Note (Signed)
Check labs 

## 2023-01-14 NOTE — Progress Notes (Signed)
Subjective:   By signing my name below, I, Marlana Latus, attest that this documentation has been prepared under the direction and in the presence of [name], DO 01/14/23   Patient ID: Stacy Ayers, female    DOB: 18-Dec-1940, 82 y.o.   MRN: [id number]  Chief Complaint  Patient presents with   Memory Loss    Pt states experiencing times of confusion and not remember what she was going to say. Feels of being overwhelmed     HPI Patient is in today for an office visit.   She reports that she has lately been very overwhelmed. She tends to be very busy in community activities [employer], choir, Research officer, political party). 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 She recently has removed a couple of these activities from her schedule.   She endorses memory changes. She has been having trouble remembering lyrics and conversations but notes that she can remember songs from a long time ago. She reports that she is very concerned and is "afraid to trust herself". About 5 months ago she locked herself out of the house. She believed that it may have been attributed to her drinking. She stopped drinking immediately and has been sober since then. She reports she has had a gastric bypass, which makes her sensitive to alcohol after 2 drinks.   She reports that around Christmas time she was having persistent headaches. She endorses dizziness that is accompanied by ear pain.   She eats no more than 3 meals a day and snacks in between.   She reports her mother had a history of an aneurysm before passing. Paternal grandfather and aunt had Alzheimer's.   She stopped Prednisone, 10 mg. She is compliant with Synthroid 137 mg daily, Metoprolol twice daily, Losartan 50 mg daily, and Effexor 75 mg daily.   She denies having any fever, new muscle pain, new joint pain, new moles, congestion, sinus pain, sore throat, chest pain, palpitations, cough, SOB, wheezing, n/v/d, constipation,  blood in stool, dysuria, frequency, or hematuria at this time.  Past Medical History:  Diagnosis Date   Abnormal liver function tests    Allergy    Anxiety    Arthritis    knees, lower back    Asthma    Cancer ([place])    Cataract    left eye growing cataract    Depression    Diabetes mellitus    had gastric bypass DM resolved-    Diverticulosis    GERD (gastroesophageal reflux disease)    Headache    Previous migraines   Heart murmur    History of colon polyps    adenomatous   Hyperlipidemia    past hx- resloved after gastric bypass    Hypertension    Hypothyroidism    IBS (irritable bowel syndrome)    Iron deficiency anemia    Osteoporosis     Past Surgical History:  Procedure Laterality Date   ABDOMINAL HYSTERECTOMY     ABDOMINAL HYSTERECTOMY     APPENDECTOMY     BLADDER REPAIR     BREAST BIOPSY Right    needle core biopsy, benign   CATARACT EXTRACTION Left    CHOLECYSTECTOMY     COLONOSCOPY     CORONARY ANGIOPLASTY     GASTRIC BYPASS     POLYPECTOMY     REVERSE SHOULDER ARTHROPLASTY Right 07/15/2020   Procedure: REVERSE SHOULDER ARTHROPLASTY;  Surgeon: Justice Britain, MD;  Location: [facility];  Service: Orthopedics;  Laterality: Right;  123mn   TONSILLECTOMY AND ADENOIDECTOMY     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   Procedure: TOTAL KNEE ARTHROPLASTY;  Surgeon: AGaynelle Arabian MD;  Location: [facility];  Service: Orthopedics;  Laterality: Left;    Family History  Problem Relation Age of Onset   Stroke Mother    Asthma Mother    Anuerysm Mother    Obesity Father    Diabetes Sister    Heart disease Maternal Grandmother    Angina Maternal Grandmother    Suicidality Maternal Grandfather    Cancer Paternal Grandmother    Alzheimer's disease Paternal Grandmother    Alzheimer's disease Paternal Aunt    Colon cancer Neg Hx    Colon polyps Neg Hx    Esophageal cancer Neg Hx    Rectal cancer Neg Hx    Stomach cancer Neg Hx    Pancreatic cancer Neg Hx     Liver disease Neg Hx     Social History   Socioeconomic History   Marital status: Single    Spouse name: Not on file   Number of children: 1   Years of education: Not on file   Highest education level: Not on file  Occupational History   Occupation: retired tProduct manager RETIRED  Tobacco Use   Smoking status: Former    Packs/day: 0.30    Years: 2.00    Total pack years: 0.60    Types: Cigarettes    Quit date: 07/11/1962    Years since quitting: 60.5   Smokeless tobacco: Never  Vaping Use   Vaping Use: Never used  Substance and Sexual Activity   Alcohol use: Not Currently   Drug use: No   Sexual activity: Yes    Birth control/protection: Surgical    Comment: Hysterectomy  Other Topics Concern   Not on file  Social History Narrative   Daily caffeine   Social Determinants of Health   Financial Resource Strain: Low Risk  (02/15/2022)   Overall Financial Resource Strain (CARDIA)    Difficulty of Paying Living Expenses: Not hard at all  Food Insecurity: No Food Insecurity (02/15/2022)   Hunger Vital Sign    Worried About Running Out of Food in the Last Year: Never true    Ran Out of Food in the Last Year: Never true  Transportation Needs: No Transportation Needs (02/15/2022)   PRAPARE - THydrologist(Medical): No    Lack of Transportation (Non-Medical): No  Physical Activity: Inactive (02/15/2022)   Exercise Vital Sign    Days of Exercise per Week: 0 days    Minutes of Exercise per Session: 0 min  Stress: No Stress Concern Present (02/15/2022)   [place]   Feeling of Stress : Not at all  Social Connections: Moderately Integrated (02/15/2022)   Social Connection and Isolation Panel [NHANES]    Frequency of Communication with Friends and Family: More than three times a week    Frequency of Social Gatherings with Friends and Family: More than three times a week     Attends Religious Services: More than 4 times per year    Active Member of [employer] Organizations: Yes    Attends CArchivistMeetings: More than 4 times per year    Marital Status: Divorced  Intimate Partner Violence: Not At Risk (02/15/2022)   Humiliation, Afraid, Rape, and Kick questionnaire  Fear of Current or Ex-Partner: No    Emotionally Abused: No    Physically Abused: No    Sexually Abused: No    Outpatient Medications Prior to Visit  Medication Sig Dispense Refill   acetaminophen (TYLENOL) 650 MG CR tablet Take 650 mg by mouth every 8 (eight) hours as needed for pain.     albuterol (PROVENTIL HFA) 108 (90 Base) MCG/ACT inhaler Inhale 2 puffs into the lungs every 6 (six) hours as needed. 1 each 3   denosumab (PROLIA) 60 MG/ML SOSY injection Inject 60 mg into the skin every 6 (six) months. Courier to rheum: [address], [place], [place] [address]. Appt on 08/03/22 1 mL 1   diclofenac Sodium (VOLTAREN) 1 % GEL Apply 2 g topically 4 (four) times daily. Do not apply to open area, apply to knee pain area     EPINEPHrine (EPIPEN 2-PAK) 0.3 mg/0.3 mL IJ SOAJ injection Inject 0.3 mg into the muscle as needed for anaphylaxis. 1 each 1   ferrous sulfate 325 (65 FE) MG tablet Take 325 mg by mouth daily with breakfast.     fluticasone (FLONASE) 50 MCG/ACT nasal spray USE 2 SPRAYS IN EACH NOSTRIL ONCE DAILY 16 g 5   guaiFENesin (MUCINEX) 600 MG 12 hr tablet Take 2 tablets (1,200 mg total) by mouth 2 (two) times daily. 30 tablet 2   losartan (COZAAR) 50 MG tablet Take 1 tablet (50 mg total) by mouth daily. 90 tablet 0   metoprolol succinate (TOPROL XL) 25 MG 24 hr tablet Take 1 tablet (25 mg total) by mouth 2 (two) times daily after a meal. 60 tablet 11   metoprolol tartrate (LOPRESSOR) 25 MG tablet Take 1 tablet (25 mg total) by mouth 3 (three) times daily as needed (palpitations). 90 tablet 3   pantoprazole (PROTONIX) 40 MG tablet TAKE 1 TABLET ONCE DAILY. 90 tablet 3    SYNTHROID 137 MCG tablet Take 1 tablet (137 mcg total) by mouth daily before breakfast. 90 tablet 0   Vitamin D, Ergocalciferol, (DRISDOL) 1.25 MG (50000 UNIT) CAPS capsule Take 1 capsule (50,000 Units total) by mouth every 7 (seven) days. 12 capsule 0   predniSONE (DELTASONE) 10 MG tablet TAKE 3 TABLETS PO QD FOR 3 DAYS THEN TAKE 2 TABLETS PO QD FOR 3 DAYS THEN TAKE 1 TABLET PO QD FOR 3 DAYS THEN TAKE 1/2 TAB PO QD FOR 3 DAYS 20 tablet 0   venlafaxine XR (EFFEXOR-XR) 75 MG 24 hr capsule TAKE 1 CAPSULE ONCE DAILY WITH BREAKFAST. 90 capsule 1   No facility-administered medications prior to visit.    Allergies  Allergen Reactions   Sulfonamide Derivatives Diarrhea   Cocaine     Other reaction(s): Hallucination Sinus irrigation   Montelukast Sodium     UNKNOWN   Penicillins     Local skin reaction.  Tolerated ancef 07/15/2020 Tolerated Cephalosporin Date: 10/20/21.     Pioglitazone     UNKNOWN    Rosiglitazone Maleate     UNKNOWN   Secobarbital Sodium     UNKNOWN   Cefdinir Palpitations    Possible reaction    Codeine Rash   Doxycycline Nausea Only    REACTION: GI UPSET    Review of Systems  Constitutional:  Negative for fever and malaise/fatigue.  HENT:  Positive for ear pain (associated with dizziness). Negative for congestion.   Eyes:  Negative for blurred vision.  Respiratory:  Negative for shortness of breath.   Cardiovascular:  Negative for chest  pain, palpitations and leg swelling.  Gastrointestinal:  Negative for abdominal pain, blood in stool and nausea.  Genitourinary:  Negative for dysuria and frequency.  Musculoskeletal:  Negative for falls.  Skin:  Negative for rash.  Neurological:  Positive for dizziness and headaches. Negative for loss of consciousness.  Endo/Heme/Allergies:  Negative for environmental allergies.  Psychiatric/Behavioral:  Positive for memory loss. Negative for depression. The patient is nervous/anxious.        Objective:    Physical  Exam Vitals and nursing note reviewed.  Constitutional:      Appearance: She is well-developed.  HENT:     Head: Normocephalic and atraumatic.  Eyes:     Conjunctiva/sclera: Conjunctivae normal.  Neck:     Thyroid: No thyromegaly.     Vascular: No carotid bruit or JVD.  Cardiovascular:     Rate and Rhythm: Normal rate and regular rhythm.     Heart sounds: Normal heart sounds. No murmur heard. Pulmonary:     Effort: Pulmonary effort is normal. No respiratory distress.     Breath sounds: Normal breath sounds. No wheezing or rales.  Chest:     Chest wall: No tenderness.  Musculoskeletal:     Cervical back: Normal range of motion and neck supple.  Neurological:     Mental Status: She is alert and oriented to person, place, and time. Mental status is at baseline.     Motor: No weakness.     Coordination: Coordination normal.     Gait: Gait normal.     Deep Tendon Reflexes: Reflexes abnormal.  Psychiatric:        Mood and Affect: Mood normal.        Behavior: Behavior normal.     Comments: Mmse 30/30 Psq 9 and gad -- elevated       BP 118/70 (BP Location: Left Arm, Patient Position: Sitting, Cuff Size: Normal)   Pulse 75   Temp 98.7 F (37.1 C) (Oral)   Resp 18   Ht 5' 7"$  (1.702 m)   Wt 171 lb 6.4 oz (77.7 kg)   SpO2 96%   BMI 26.85 kg/m  Wt Readings from Last 3 Encounters:  01/14/23 171 lb 6.4 oz (77.7 kg)  12/06/22 169 lb 3.2 oz (76.7 kg)  11/23/22 169 lb 14.4 oz (77.1 kg)       Assessment & Plan:  Memory loss Assessment & Plan: Refer neuropsychology  Orders: -     CBC with Differential/Platelet -     Comprehensive metabolic panel -     Lipid panel -     TSH -     Sedimentation rate -     Vitamin B12 -     VITAMIN D 25 Hydroxy (Vit-D Deficiency, Fractures) -     Ambulatory referral to Neuropsychology  Vertigo -     MR BRAIN WO CONTRAST; Future -     CBC with Differential/Platelet -     Comprehensive metabolic panel -     Lipid panel -     TSH -      Sedimentation rate -     Vitamin B12 -     VITAMIN D 25 Hydroxy (Vit-D Deficiency, Fractures)  Nonintractable episodic headache, unspecified headache type -     MR BRAIN WO CONTRAST; Future -     CBC with Differential/Platelet -     Comprehensive metabolic panel -     Lipid panel -     TSH -     Sedimentation rate -  Vitamin B12 -     VITAMIN D 25 Hydroxy (Vit-D Deficiency, Fractures)  Postablative hypothyroidism Assessment & Plan: Check labs   Orders: -     TSH  Vitamin D deficiency -     VITAMIN D 25 Hydroxy (Vit-D Deficiency, Fractures)  B12 deficiency Assessment & Plan: Check labs   Orders: -     Vitamin B12  Essential hypertension Assessment & Plan: Well controlled, no changes to meds. Encouraged heart healthy diet such as the DASH diet and exercise as tolerated.    Orders: -     CBC with Differential/Platelet -     Comprehensive metabolic panel -     Lipid panel -     TSH -     Sedimentation rate -     Vitamin B12 -     VITAMIN D 25 Hydroxy (Vit-D Deficiency, Fractures)  Depression with anxiety Assessment & Plan: Inc effexor 150 mg daily  F/u 1 month or sooner as needed   Orders: -     Venlafaxine HCl ER; Take 1 capsule (150 mg total) by mouth daily with breakfast.  Dispense: 90 capsule; Refill: 1  Uncomplicated alcohol dependence (HCC) Assessment & Plan: Quit drinking 4 months ago    Type 2 diabetes mellitus with other ophthalmic complication, without long-term current use of insulin (HCC) Assessment & Plan: [id number]  to be checked minimize simple carbs. Increase exercise as tolerated. Continue current meds    Hyperlipidemia LDL goal <100 Assessment & Plan: Encourage heart healthy diet such as MIND or DASH diet, increase exercise, avoid trans fats, simple carbohydrates and processed foods, consider a krill or fish or flaxseed oil cap daily.        I,Rachel Rivera,acting as a Education administrator for [employer], DO.,have documented all  relevant documentation on the behalf of [name], DO,as directed by  [name], DO while in the presence of [place], DO, personally preformed the services described in this documentation.  All medical record entries made by the scribe were at my direction and in my presence.  I have reviewed the chart and discharge instructions (if applicable) and agree that the record reflects my personal performance and is accurate and complete. 01/14/23   [name], DO

## 2023-01-14 NOTE — Assessment & Plan Note (Signed)
Quit drinking 4 months ago

## 2023-01-14 NOTE — Assessment & Plan Note (Signed)
Inc effexor 150 mg daily  F/u 1 month or sooner as needed

## 2023-01-14 NOTE — Assessment & Plan Note (Signed)
Well controlled, no changes to meds. Encouraged heart healthy diet such as the DASH diet and exercise as tolerated.  

## 2023-01-17 ENCOUNTER — Telehealth: Payer: Self-pay | Admitting: Family Medicine

## 2023-01-17 ENCOUNTER — Telehealth: Payer: Self-pay | Admitting: Pharmacist

## 2023-01-17 NOTE — Telephone Encounter (Signed)
Pt called stating that the [facility] would not have availability until November at this point. Pt would like to see if there are other clinic that she could go to be seen sooner.

## 2023-01-17 NOTE — Telephone Encounter (Signed)
Rx for Prolia received from [facility]. Placed in refrigerator.  Patient is due on or after 01/30/2023. CMP and Vitamin D on 01/14/2023 stable.  Next f/u appt on 01/25/2023 with Dr. Benjamine Mola.  Knox Saliva, PharmD, MPH, BCPS, CPP Clinical Pharmacist (Rheumatology and Pulmonology)

## 2023-01-19 ENCOUNTER — Other Ambulatory Visit: Payer: Self-pay | Admitting: Family Medicine

## 2023-01-19 DIAGNOSIS — E890 Postprocedural hypothyroidism: Secondary | ICD-10-CM

## 2023-01-19 NOTE — Telephone Encounter (Signed)
Patient called back and was informed of message. She stated she understood.

## 2023-01-20 ENCOUNTER — Other Ambulatory Visit: Payer: Self-pay | Admitting: *Deleted

## 2023-01-20 MED ORDER — SYNTHROID 125 MCG PO TABS: 125.0000 ug | ORAL_TABLET | Freq: Every day | ORAL | 2 refills | Status: DC

## 2023-01-24 NOTE — Progress Notes (Unsigned)
Office Visit Note  Patient: Stacy Ayers             Date of Birth: 05-10-1941           MRN: [id number]             PCP: [name], DO Referring: [name], * Visit Date: 01/25/2023   Subjective:  No chief complaint on file.   History of Present Illness: Stacy Ayers is a 82 y.o. female here for follow up ***   Previous HPI 01/25/22 Stacy Ayers is a 82 y.o. female here for follow up for osteoporosis on prolia treatment. Her most recent labs have shown persistent mild hypocalcemia. She had a recent COVID illness earlier this month so delayed initial time for this. She had left knee replacement surgery in November this was mildly complicated by cardiac arrhythmia then also hospitalized again with abdominal pain thought to be from lactic acidosis related to cystitis and improved after fluids and antibiotics treatment. She has lost weight 10-20 lbs throughout this process. She is graduating from physical therapy today. She has noticed some numbness and finger triggering in her hands first thing after waking and when driving. She had carpal tunnel syndrome years ago before her gastric bypass surgery treated with wrist splints but not since then.   Previous HPI 06/25/21 Stacy Ayers is a 82 y.o. 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  She has been on treatment with Prolia since around 4 years ago due to age-related osteoporosis identified on bone densitometry without any pathologic fractures.  She has been off the medication for about 1 year she is not entirely sure why some appointments were rescheduled related to COVID issues.  She believes was tolerating the medication well she had some generalized stiffness and body aches but these were chronic and have also continued in the past year with no medication.  She has a history of right shoulder reverse arthroplasty for osteoarthritis she is currently  seeing Dr. Wynelle Link for left knee osteoarthritis considering arthroplasty.  She has had a fracture of her right fifth finger no other joint fractures.  She had a fall last year she reports as tripping when trying to walk to her door while distracted this was evaluated at the ED and no significant injury was sustained. She was not a candidate for oral antiresorptive therapy due to history of bariatric surgery.  She takes vitamin D supplementation regularly she does not take calcium regularly due to GI side effects so she does not absorb this well.  She reports a total height loss of about 0.75".   No Rheumatology ROS completed.   PMFS History:  Patient Active Problem List   Diagnosis Date Noted   Nonintractable episodic headache 01/14/2023   Memory loss 01/14/2023   Non-recurrent acute serous otitis media of both ears 12/06/2022   Vertigo 12/06/2022   Bronchitis 12/06/2022   Trigger finger of right hand 11/03/2022   Nut allergy 07/30/2022   Bee sting allergy 07/30/2022   Urinary tract infection without hematuria 03/26/2022   Hypocalcemia 01/25/2022   Pressure injury of skin 10/29/2021   Lactic acidosis 10/28/2021   Left knee pain 10/28/2021   Primary osteoarthritis of left knee 10/19/2021   Pre-op evaluation 10/12/2021   Depression, major, single episode, mild ([place]) 07/24/2021   Age-related osteoporosis without current pathological fracture 06/25/2021   Diabetes mellitus ([place]) 04/28/2021  DM (diabetes mellitus) type II, controlled, with peripheral vascular disorder ([place]) 04/28/2021   Morbid obesity ([place]) 04/28/2021   Anemia 04/28/2021   Fatigue 03/23/2021   Right ear pain 11/06/2020   Osteoarthritis of right knee 10/01/2020   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   Viral upper respiratory tract infection 11/14/2018   OA (osteoarthritis) of knee 05/12/2018   Dysuria 05/03/2018   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   Seasonal allergies 08/11/2015    Alcohol dependence ([place]) 08/11/2015   CTS (carpal tunnel syndrome) 08/11/2015   Allergic rhinitis 12/19/2013   B12 deficiency 05/22/2013   Left foot pain 09/06/2012   Depression with anxiety 04/05/2012   Abdominal pain, generalized 01/18/2011   PERSONAL HISTORY OF COLONIC POLYPS 01/18/2011   SPONTANEOUS ECCHYMOSES 10/13/2010   DIARRHEA, CHRONIC 10/13/2010   PHARYNGITIS, CHRONIC 02/16/2010   Hypothyroidism 01/14/2010   History of bariatric surgery 02/25/2009   Vitamin D deficiency 09/10/2008   VARICOSE VEINS, LOWER EXTREMITIES 09/10/2008   PULMONARY NODULE 08/14/2008   FATIGUE 08/14/2008   MUSCLE PAIN 06/14/2007   BLADDER PAIN 06/14/2007   COLONIC POLYPS 03/14/2007   History of diet-controlled diabetes 03/14/2007   Hyperlipidemia LDL goal <100 03/14/2007   ANEMIA-IRON DEFICIENCY 03/14/2007   Essential hypertension 03/14/2007   VOCAL CORD PARALYSIS 03/14/2007   EDEMA, LARYNX 03/14/2007   ASTHMA 03/14/2007   Esophageal reflux 03/14/2007   LIVER FUNCTION TESTS, ABNORMAL, HX OF 03/14/2007   TONSILLECTOMY AND ADENOIDECTOMY, HX OF 03/14/2007    Past Medical History:  Diagnosis Date   Abnormal liver function tests    Allergy    Anxiety    Arthritis    knees, lower back    Asthma    Cancer ([place])    Cataract    left eye growing cataract    Depression    Diabetes mellitus    had gastric bypass DM resolved-    Diverticulosis    GERD (gastroesophageal reflux disease)    Headache    Previous migraines   Heart murmur    History of colon polyps    adenomatous   Hyperlipidemia    past hx- resloved after gastric bypass    Hypertension    Hypothyroidism    IBS (irritable bowel syndrome)    Iron deficiency anemia    Osteoporosis     Family History  Problem Relation Age of Onset   Stroke Mother    Asthma Mother    Anuerysm Mother    Obesity Father    Diabetes Sister    Heart disease Maternal Grandmother    Angina Maternal Grandmother    Suicidality Maternal Grandfather     Cancer Paternal Grandmother    Alzheimer's disease Paternal Grandmother    Alzheimer's disease Paternal Aunt    Colon cancer Neg Hx    Colon polyps Neg Hx    Esophageal cancer Neg Hx    Rectal cancer Neg Hx    Stomach cancer Neg Hx    Pancreatic cancer Neg Hx    Liver disease Neg Hx    Past Surgical History:  Procedure Laterality Date   ABDOMINAL HYSTERECTOMY     ABDOMINAL HYSTERECTOMY     APPENDECTOMY     BLADDER REPAIR     BREAST BIOPSY Right    needle core biopsy, benign   CATARACT EXTRACTION Left    CHOLECYSTECTOMY     COLONOSCOPY     CORONARY ANGIOPLASTY     GASTRIC BYPASS     POLYPECTOMY  REVERSE SHOULDER ARTHROPLASTY Right 07/15/2020   Procedure: REVERSE SHOULDER ARTHROPLASTY;  Surgeon: Justice Britain, MD;  Location: [facility];  Service: Orthopedics;  Laterality: Right;  120mn   TONSILLECTOMY AND ADENOIDECTOMY     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   Procedure: TOTAL KNEE ARTHROPLASTY;  Surgeon: AGaynelle Arabian MD;  Location: [facility];  Service: Orthopedics;  Laterality: Left;   Social History   Social History Narrative   Daily caffeine   Immunization History  Administered Date(s) Administered   Fluad Quad(high Dose 65+) 10/12/2021, 09/03/2022   Influenza Split 09/08/2011, 08/25/2012   Influenza Whole 08/26/2010   Influenza, High Dose Seasonal PF 08/06/2016, 08/05/2017, 10/31/2018   Influenza, Quadrivalent, Recombinant, Inj, Pf 09/07/2019   Influenza,inj,Quad PF,6+ Mos 08/09/2014, 08/08/2015   Influenza,inj,quad, With Preservative 08/01/2017   Moderna Covid-19 Vaccine Bivalent Booster 158yr& up 11/17/2021   Moderna Sars-Covid-2 Vaccination 12/11/2019, 01/08/2020, 10/14/2020, 03/03/2021   Pneumococcal Conjugate-13 12/27/2014   Pneumococcal Polysaccharide-23 08/05/2010, 08/05/2017   Tdap 07/12/2011   Zoster Recombinat (Shingrix) 09/07/2019   Zoster, Live 07/21/2011     Objective: Vital Signs: There were no vitals taken for this visit.   Physical  Exam   Musculoskeletal Exam: ***  CDAI Exam: CDAI Score: -- Patient Global: --; Provider Global: -- Swollen: --; Tender: -- Joint Exam 01/25/2023   No joint exam has been documented for this visit   There is currently no information documented on the homunculus. Go to the Rheumatology activity and complete the homunculus joint exam.  Investigation: No additional findings.  Imaging: No results found.  Recent Labs: Lab Results  Component Value Date   WBC 5.1 01/14/2023   HGB 11.6 (L) 01/14/2023   PLT 224.0 01/14/2023   NA 139 01/14/2023   K 4.7 01/14/2023   CL 102 01/14/2023   CO2 31 01/14/2023   GLUCOSE 101 (H) 01/14/2023   BUN 13 01/14/2023   CREATININE 0.66 01/14/2023   BILITOT 0.6 01/14/2023   ALKPHOS 49 01/14/2023   AST 16 01/14/2023   ALT 15 01/14/2023   PROT 6.5 01/14/2023   ALBUMIN 4.2 01/14/2023   CALCIUM 9.8 01/14/2023   GFRAA >60 07/09/2020    Speciality Comments: No specialty comments available.  Procedures:  No procedures performed Allergies: Sulfonamide derivatives, Cocaine, Montelukast sodium, Penicillins, Pioglitazone, Rosiglitazone maleate, Secobarbital sodium, Cefdinir, Codeine, and Doxycycline   Assessment / Plan:     Visit Diagnoses: No diagnosis found.  ***  Orders: No orders of the defined types were placed in this encounter.  No orders of the defined types were placed in this encounter.    Follow-Up Instructions: No follow-ups on file.   ChCollier SalinaMD  Note - This record has been created using [employer] Chart creation errors have been sought, but may not always  have been located. Such creation errors do not reflect on  the standard of medical care.

## 2023-01-25 ENCOUNTER — Encounter: Payer: Self-pay | Admitting: Internal Medicine

## 2023-01-25 ENCOUNTER — Ambulatory Visit: Payer: Medicare PPO | Attending: Internal Medicine | Admitting: Internal Medicine

## 2023-01-25 VITALS — BP 120/79 | HR 81 | Resp 12 | Ht 67.0 in | Wt 171.0 lb

## 2023-01-25 DIAGNOSIS — Z7689 Persons encountering health services in other specified circumstances: Secondary | ICD-10-CM | POA: Diagnosis not present

## 2023-01-25 DIAGNOSIS — M1712 Unilateral primary osteoarthritis, left knee: Secondary | ICD-10-CM

## 2023-01-25 DIAGNOSIS — E559 Vitamin D deficiency, unspecified: Secondary | ICD-10-CM | POA: Diagnosis not present

## 2023-01-25 DIAGNOSIS — M6530 Trigger finger, unspecified finger: Secondary | ICD-10-CM

## 2023-01-25 DIAGNOSIS — M810 Age-related osteoporosis without current pathological fracture: Secondary | ICD-10-CM

## 2023-01-25 MED ORDER — DENOSUMAB 60 MG/ML ~~LOC~~ SOSY
60.0000 mg | PREFILLED_SYRINGE | Freq: Once | SUBCUTANEOUS | Status: AC
  Administered 2023-01-25: 60 mg via SUBCUTANEOUS

## 2023-01-25 NOTE — Progress Notes (Signed)
Pharmacy Note  Subjective:   Patient presents to clinic today to receive bi-annual dose of Prolia. Patient's last dose of Prolia was on 08/03/2022  Patient running a fever or have signs/symptoms of infection? No  Patient currently on antibiotics for the treatment of infection? No  Patient had fall in the last 6 months?  No    Patient taking calcium 1200 mg daily through diet or supplement and at least 800 units vitamin D? Yes  Objective: CMP     Component Value Date/Time   NA 139 01/14/2023 1040   NA 142 11/02/2021 0000   K 4.7 01/14/2023 1040   CL 102 01/14/2023 1040   CO2 31 01/14/2023 1040   GLUCOSE 101 (H) 01/14/2023 1040   BUN 13 01/14/2023 1040   BUN 8 11/02/2021 0000   CREATININE 0.66 01/14/2023 1040   CREATININE 0.82 07/23/2022 1456   CALCIUM 9.8 01/14/2023 1040   PROT 6.5 01/14/2023 1040   PROT 7.2 04/18/2017 1517   ALBUMIN 4.2 01/14/2023 1040   ALBUMIN 4.5 04/18/2017 1517   AST 16 01/14/2023 1040   ALT 15 01/14/2023 1040   ALKPHOS 49 01/14/2023 1040   BILITOT 0.6 01/14/2023 1040   BILITOT 0.2 04/18/2017 1517   GFRNONAA >60 10/31/2021 1006   GFRAA >60 07/09/2020 0859    CBC    Component Value Date/Time   WBC 5.1 01/14/2023 1040   RBC 3.44 (L) 01/14/2023 1040   HGB 11.6 (L) 01/14/2023 1040   HGB 12.6 05/17/2006 1039   HCT 33.7 (L) 01/14/2023 1040   HCT 36.5 05/17/2006 1039   PLT 224.0 01/14/2023 1040   PLT 155 05/17/2006 1039   MCV 98.0 01/14/2023 1040   MCV 90.5 05/17/2006 1039   MCH 34.2 (H) 07/23/2022 1456   MCHC 34.4 01/14/2023 1040   RDW 14.0 01/14/2023 1040   RDW 14.7 (H) 05/17/2006 1039   LYMPHSABS 1.6 01/14/2023 1040   LYMPHSABS 1.1 05/17/2006 1039   MONOABS 0.4 01/14/2023 1040   MONOABS 0.3 05/17/2006 1039   EOSABS 0.1 01/14/2023 1040   EOSABS 0.1 05/17/2006 1039   BASOSABS 0.0 01/14/2023 1040   BASOSABS 0.0 05/17/2006 1039    Lab Results  Component Value Date   VD25OH 41.99 01/14/2023   T-score: 05/18/21 - BMD measured at Forearm  Radius 33% is 0.546 g/cm2 with a T-score of -3.8.   Assessment/Plan:   Reviewed importance of adequate dietary intake of calcium in addition to supplementation due to risk of hypocalcemia with Prolia.   Patient tolerated injection without issue.  Administrations This Visit     denosumab (PROLIA) injection 60 mg     Admin Date 01/25/2023 Action Given Dose 60 mg Route Subcutaneous Administered By Cassandria Anger, RPH-CPP           Patient's next Prolia dose is due on 07/24/2023.  Patient is due for updated DEXA in June 2024 (previously had it completed at [facility]).   All questions encouraged and answered.  Instructed patient to call with any further questions or concerns.  Knox Saliva, PharmD, MPH, BCPS, CPP Clinical Pharmacist (Rheumatology and Pulmonology)

## 2023-01-25 NOTE — Telephone Encounter (Signed)
Prolia completed at [facility] today with Dr. Benjamine Mola per ok from Dr. Tristan Schroeder, PharmD, MPH, BCPS, CPP Clinical Pharmacist (Rheumatology and Pulmonology)

## 2023-01-29 ENCOUNTER — Ambulatory Visit
Admission: RE | Admit: 2023-01-29 | Discharge: 2023-01-29 | Disposition: A | Discharge status: Home / Self Care | Payer: Medicare PPO | Source: Ambulatory Visit | Attending: Family Medicine | Admitting: Family Medicine

## 2023-01-29 DIAGNOSIS — R42 Dizziness and giddiness: Secondary | ICD-10-CM

## 2023-01-29 DIAGNOSIS — R519 Headache, unspecified: Secondary | ICD-10-CM

## 2023-02-04 ENCOUNTER — Telehealth: Payer: Self-pay | Admitting: Family Medicine

## 2023-02-04 NOTE — Telephone Encounter (Signed)
Copied from [place] [id number]. Topic: Medicare AWV >> Feb 04, 2023  2:36 PM Devoria Glassing wrote: Reason for CRM: Called patient to schedule Medicare Annual Wellness Visit (AWV). Left message for patient to call back and schedule Medicare Annual Wellness Visit (AWV).  Last date of AWV: 02/05/22  Please schedule an appointment at any time with [facility].  If any questions, please contact me.  Thank you ,  Sherol Dade; [place] Direct Dial: [phone]

## 2023-02-09 ENCOUNTER — Other Ambulatory Visit (HOSPITAL_COMMUNITY): Payer: Self-pay

## 2023-02-18 ENCOUNTER — Telehealth: Payer: Self-pay | Admitting: Family Medicine

## 2023-02-18 NOTE — Telephone Encounter (Signed)
Contacted [name] to schedule their annual wellness visit. Appointment made for 02/18/2023.

## 2023-02-20 ENCOUNTER — Other Ambulatory Visit: Payer: Self-pay | Admitting: Family Medicine

## 2023-02-20 DIAGNOSIS — K219 Gastro-esophageal reflux disease without esophagitis: Secondary | ICD-10-CM

## 2023-02-23 ENCOUNTER — Ambulatory Visit (INDEPENDENT_AMBULATORY_CARE_PROVIDER_SITE_OTHER): Payer: Medicare PPO | Admitting: *Deleted

## 2023-02-23 DIAGNOSIS — Z0000 Encounter for general adult medical examination without abnormal findings: Secondary | ICD-10-CM

## 2023-02-23 NOTE — Patient Instructions (Signed)
Stacy Ayers , Thank you for taking time to come for your Medicare Wellness Visit. I appreciate your ongoing commitment to your health goals. Please review the following plan we discussed and let me know if I can assist you in the future.   These are the goals we discussed:  Goals      Increase physical activity        This is a list of the screening recommended for you and due dates:  Health Maintenance  Topic Date Due   Eye exam for diabetics  07/21/2018   Zoster (Shingles) Vaccine (2 of 2) 11/02/2019   DTaP/Tdap/Td vaccine (2 - Td or Tdap) 07/11/2021   COVID-19 Vaccine (6 - 2023-24 season) 07/30/2022   Hemoglobin A1C  05/04/2023   Yearly kidney health urinalysis for diabetes  11/03/2023   Complete foot exam   11/03/2023   Yearly kidney function blood test for diabetes  01/15/2024   Medicare Annual Wellness Visit  02/23/2024   Pneumonia Vaccine  Completed   Flu Shot  Completed   DEXA scan (bone density measurement)  Completed   Hepatitis C Screening: USPSTF Recommendation to screen - Ages 90-79 yo.  Completed   HPV Vaccine  Aged Out   Colon Cancer Screening  Discontinued     Next appointment: Follow up in one year for your annual wellness visit.   Preventive Care 45 Years and Older, Female Preventive care refers to lifestyle choices and visits with your health care provider that can promote health and wellness. What does preventive care include? A yearly physical exam. This is also called an annual well check. Dental exams once or twice a year. Routine eye exams. Ask your health care provider how often you should have your eyes checked. Personal lifestyle choices, including: Daily care of your teeth and gums. Regular physical activity. Eating a healthy diet. Avoiding tobacco and drug use. Limiting alcohol use. Practicing safe sex. Taking low-dose aspirin every day. Taking vitamin and mineral supplements as recommended by your health care provider. What happens during  an annual well check? The services and screenings done by your health care provider during your annual well check will depend on your age, overall health, lifestyle risk factors, and family history of disease. Counseling  Your health care provider may ask you questions about your: Alcohol use. Tobacco use. Drug use. Emotional well-being. Home and relationship well-being. Sexual activity. Eating habits. History of falls. Memory and ability to understand (cognition). Work and work Statistician. Reproductive health. Screening  You may have the following tests or measurements: Height, weight, and BMI. Blood pressure. Lipid and cholesterol levels. These may be checked every 5 years, or more frequently if you are over 19 years old. Skin check. Lung cancer screening. You may have this screening every year starting at age 71 if you have a 30-pack-year history of smoking and currently smoke or have quit within the past 15 years. Fecal occult blood test (FOBT) of the stool. You may have this test every year starting at age 12. Flexible sigmoidoscopy or colonoscopy. You may have a sigmoidoscopy every 5 years or a colonoscopy every 10 years starting at age 58. Hepatitis C blood test. Hepatitis B blood test. Sexually transmitted disease (STD) testing. Diabetes screening. This is done by checking your blood sugar (glucose) after you have not eaten for a while (fasting). You may have this done every 1-3 years. Bone density scan. This is done to screen for osteoporosis. You may have this done starting at age  65. Mammogram. This may be done every 1-2 years. Talk to your health care provider about how often you should have regular mammograms. Talk with your health care provider about your test results, treatment options, and if necessary, the need for more tests. Vaccines  Your health care provider may recommend certain vaccines, such as: Influenza vaccine. This is recommended every year. Tetanus,  diphtheria, and acellular pertussis (Tdap, Td) vaccine. You may need a Td booster every 10 years. Zoster vaccine. You may need this after age 51. Pneumococcal 13-valent conjugate (PCV13) vaccine. One dose is recommended after age 72. Pneumococcal polysaccharide (PPSV23) vaccine. One dose is recommended after age 76. Talk to your health care provider about which screenings and vaccines you need and how often you need them. This information is not intended to replace advice given to you by your health care provider. Make sure you discuss any questions you have with your health care provider. Document Released: 12/12/2015 Document Revised: 08/04/2016 Document Reviewed: 09/16/2015 Elsevier Interactive Patient Education  2017 [place] Prevention in the Home Falls can cause injuries. They can happen to people of all ages. There are many things you can do to make your home safe and to help prevent falls. What can I do on the outside of my home? Regularly fix the edges of walkways and driveways and fix any cracks. Remove anything that might make you trip as you walk through a door, such as a raised step or threshold. Trim any bushes or trees on the path to your home. Use bright outdoor lighting. Clear any walking paths of anything that might make someone trip, such as rocks or tools. Regularly check to see if handrails are loose or broken. Make sure that both sides of any steps have handrails. Any raised decks and porches should have guardrails on the edges. Have any leaves, snow, or ice cleared regularly. Use sand or salt on walking paths during winter. Clean up any spills in your garage right away. This includes oil or grease spills. What can I do in the bathroom? Use night lights. Install grab bars by the toilet and in the tub and shower. Do not use towel bars as grab bars. Use non-skid mats or decals in the tub or shower. If you need to sit down in the shower, use a plastic,  non-slip stool. Keep the floor dry. Clean up any water that spills on the floor as soon as it happens. Remove soap buildup in the tub or shower regularly. Attach bath mats securely with double-sided non-slip rug tape. Do not have throw rugs and other things on the floor that can make you trip. What can I do in the bedroom? Use night lights. Make sure that you have a light by your bed that is easy to reach. Do not use any sheets or blankets that are too big for your bed. They should not hang down onto the floor. Have a firm chair that has side arms. You can use this for support while you get dressed. Do not have throw rugs and other things on the floor that can make you trip. What can I do in the kitchen? Clean up any spills right away. Avoid walking on wet floors. Keep items that you use a lot in easy-to-reach places. If you need to reach something above you, use a strong step stool that has a grab bar. Keep electrical cords out of the way. Do not use floor polish or wax that makes floors slippery.  If you must use wax, use non-skid floor wax. Do not have throw rugs and other things on the floor that can make you trip. What can I do with my stairs? Do not leave any items on the stairs. Make sure that there are handrails on both sides of the stairs and use them. Fix handrails that are broken or loose. Make sure that handrails are as long as the stairways. Check any carpeting to make sure that it is firmly attached to the stairs. Fix any carpet that is loose or worn. Avoid having throw rugs at the top or bottom of the stairs. If you do have throw rugs, attach them to the floor with carpet tape. Make sure that you have a light switch at the top of the stairs and the bottom of the stairs. If you do not have them, ask someone to add them for you. What else can I do to help prevent falls? Wear shoes that: Do not have high heels. Have rubber bottoms. Are comfortable and fit you well. Are closed  at the toe. Do not wear sandals. If you use a stepladder: Make sure that it is fully opened. Do not climb a closed stepladder. Make sure that both sides of the stepladder are locked into place. Ask someone to hold it for you, if possible. Clearly mark and make sure that you can see: Any grab bars or handrails. First and last steps. Where the edge of each step is. Use tools that help you move around (mobility aids) if they are needed. These include: Canes. Walkers. Scooters. Crutches. Turn on the lights when you go into a dark area. Replace any light bulbs as soon as they burn out. Set up your furniture so you have a clear path. Avoid moving your furniture around. If any of your floors are uneven, fix them. If there are any pets around you, be aware of where they are. Review your medicines with your doctor. Some medicines can make you feel dizzy. This can increase your chance of falling. Ask your doctor what other things that you can do to help prevent falls. This information is not intended to replace advice given to you by your health care provider. Make sure you discuss any questions you have with your health care provider. Document Released: 09/11/2009 Document Revised: 04/22/2016 Document Reviewed: 12/20/2014 Elsevier Interactive Patient Education  2017 [employer].

## 2023-02-23 NOTE — Progress Notes (Signed)
Subjective:   Stacy Ayers is a 82 y.o. female who presents for Medicare Annual (Subsequent) preventive examination.  I connected with  [name] on 02/23/23 by a audio enabled telemedicine application and verified that I am speaking with the correct person using two identifiers.  Patient Location: Home  Provider Location: Office/Clinic  I discussed the limitations of evaluation and management by telemedicine. The patient expressed understanding and agreed to proceed.   Review of Systems     Cardiac Risk Factors include: advanced age (>35men, >77 women);diabetes mellitus;dyslipidemia;hypertension     Objective:    There were no vitals filed for this visit. There is no height or weight on file to calculate BMI.     02/23/2023   10:18 AM 02/15/2022    2:43 PM 11/06/2021    9:24 AM 11/02/2021   10:23 AM 10/31/2021    9:46 AM 10/28/2021    7:42 PM 10/26/2021    9:43 AM  Advanced Directives  Does Patient Have a Medical Advance Directive? Yes Yes Yes Yes Yes Yes Yes  Type of Paramedic of [place];Living will [place];Living will Living will;Healthcare Power of Attorney Living will;Healthcare Power of Attorney Living will;Healthcare Power of [place];Living will [place];Living will  Does patient want to make changes to medical advance directive? No - Patient declined  No - Patient declined No - Patient declined No - Patient declined No - Patient declined No - Patient declined  Copy of [place] in Chart? Yes - validated most recent copy scanned in chart (See row information) No - copy requested Yes - validated most recent copy scanned in chart (See row information) Yes - validated most recent copy scanned in chart (See row information) No - copy requested, Physician notified No - copy requested No - copy requested    Current Medications (verified) Outpatient Encounter  Medications as of 02/23/2023  Medication Sig   acetaminophen (TYLENOL) 650 MG CR tablet Take 650 mg by mouth every 8 (eight) hours as needed for pain.   albuterol (PROVENTIL HFA) 108 (90 Base) MCG/ACT inhaler Inhale 2 puffs into the lungs every 6 (six) hours as needed.   CRANBERRY PO Take by mouth.   Cyanocobalamin (VITAMIN B-12 PO) Take by mouth.   denosumab (PROLIA) 60 MG/ML SOSY injection Inject 60 mg into the skin every 6 (six) months. Courier to rheum: [address], [place], [place] [address]. Appt on 08/03/22   diclofenac Sodium (VOLTAREN) 1 % GEL Apply 2 g topically 4 (four) times daily. Do not apply to open area, apply to knee pain area   EPINEPHrine (EPIPEN 2-PAK) 0.3 mg/0.3 mL IJ SOAJ injection Inject 0.3 mg into the muscle as needed for anaphylaxis. (Patient not taking: Reported on 01/25/2023)   Ferrous Sulfate (IRON PO) Take by mouth.   ferrous sulfate 325 (65 FE) MG tablet Take 325 mg by mouth daily with breakfast.   fluticasone (FLONASE) 50 MCG/ACT nasal spray USE 2 SPRAYS IN EACH NOSTRIL ONCE DAILY   Loperamide HCl (ANTI-DIARRHEAL PO) Take by mouth.   losartan (COZAAR) 50 MG tablet TAKE ONE TABLET BY MOUTH DAILY   metoprolol succinate (TOPROL XL) 25 MG 24 hr tablet Take 1 tablet (25 mg total) by mouth 2 (two) times daily after a meal.   metoprolol tartrate (LOPRESSOR) 25 MG tablet Take 1 tablet (25 mg total) by mouth 3 (three) times daily as needed (palpitations).   pantoprazole (  PROTONIX) 40 MG tablet TAKE 1 TABLET ONCE DAILY.   SYNTHROID 125 MCG tablet Take 1 tablet (125 mcg total) by mouth daily before breakfast.   venlafaxine XR (EFFEXOR XR) 150 MG 24 hr capsule Take 1 capsule (150 mg total) by mouth daily with breakfast.   Vitamin D, Ergocalciferol, (DRISDOL) 1.25 MG (50000 UNIT) CAPS capsule Take 1 capsule (50,000 Units total) by mouth every 7 (seven) days.   [DISCONTINUED] azithromycin (ZITHROMAX) 250 MG tablet TAKE 2 TABLETS BY MOUTH ON DAY 1, THEN TAKE 1 TABLET DAILY  ON DAYS 2-5   [DISCONTINUED] fluconazole (DIFLUCAN) 150 MG tablet TAKE ONE TABLET BY MOUTH now, MAY REPEAT in THREE DAYS AS NEEDED.   [DISCONTINUED] guaiFENesin (MUCINEX) 600 MG 12 hr tablet Take 2 tablets (1,200 mg total) by mouth 2 (two) times daily.   [DISCONTINUED] metoprolol succinate (TOPROL-XL) 50 MG 24 hr tablet Take 1 tablet (50 mg total) by mouth in the AM, then take 1/2 tablet (25 mg total) by mouth in the PM, everyday. Take with or immediately following a meal.   No facility-administered encounter medications on file as of 02/23/2023.    Allergies (verified) Sulfonamide derivatives, Cocaine, Montelukast sodium, Penicillins, Pioglitazone, Rosiglitazone maleate, Secobarbital sodium, Cefdinir, Codeine, and Doxycycline   History: Past Medical History:  Diagnosis Date   Abnormal liver function tests    Allergy    Anxiety    Arthritis    knees, lower back    Asthma    Cancer ([place])    Cataract    left eye growing cataract    Depression    Diabetes mellitus    had gastric bypass DM resolved-    Diverticulosis    GERD (gastroesophageal reflux disease)    Headache    Previous migraines   Heart murmur    History of colon polyps    adenomatous   Hyperlipidemia    past hx- resloved after gastric bypass    Hypertension    Hypothyroidism    IBS (irritable bowel syndrome)    Iron deficiency anemia    Osteoporosis    Past Surgical History:  Procedure Laterality Date   ABDOMINAL HYSTERECTOMY     ABDOMINAL HYSTERECTOMY     APPENDECTOMY     BLADDER REPAIR     BREAST BIOPSY Right    needle core biopsy, benign   CARPAL TUNNEL RELEASE Left    Hand   CATARACT EXTRACTION Left    CHOLECYSTECTOMY     COLONOSCOPY     CORONARY ANGIOPLASTY     GASTRIC BYPASS     POLYPECTOMY     REVERSE SHOULDER ARTHROPLASTY Right 07/15/2020   Procedure: REVERSE SHOULDER ARTHROPLASTY;  Surgeon: Justice Britain, MD;  Location: [facility];  Service: Orthopedics;  Laterality: Right;  130min    TONSILLECTOMY AND ADENOIDECTOMY     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   Procedure: TOTAL KNEE ARTHROPLASTY;  Surgeon: Gaynelle Arabian, MD;  Location: [facility];  Service: Orthopedics;  Laterality: Left;   Family History  Problem Relation Age of Onset   Stroke Mother    Asthma Mother    Anuerysm Mother    Obesity Father    Diabetes Sister    Heart disease Maternal Grandmother    Angina Maternal Grandmother    Suicidality Maternal Grandfather    Cancer Paternal Grandmother    Alzheimer's disease Paternal Grandmother    Alzheimer's disease Paternal Aunt    Colon cancer Neg Hx    Colon polyps Neg Hx    Esophageal  cancer Neg Hx    Rectal cancer Neg Hx    Stomach cancer Neg Hx    Pancreatic cancer Neg Hx    Liver disease Neg Hx    Social History   Socioeconomic History   Marital status: Single    Spouse name: Not on file   Number of children: 1   Years of education: Not on file   Highest education level: Not on file  Occupational History   Occupation: retired Product manager: RETIRED  Tobacco Use   Smoking status: Former    Packs/day: 0.30    Years: 2.00    Additional pack years: 0.00    Total pack years: 0.60    Types: Cigarettes    Quit date: 07/11/1962    Years since quitting: 60.6    Passive exposure: Past   Smokeless tobacco: Never  Vaping Use   Vaping Use: Never used  Substance and Sexual Activity   Alcohol use: Not Currently   Drug use: No   Sexual activity: Yes    Birth control/protection: Surgical    Comment: Hysterectomy  Other Topics Concern   Not on file  Social History Narrative   Daily caffeine   Social Determinants of Health   Financial Resource Strain: Low Risk  (02/15/2022)   Overall Financial Resource Strain (CARDIA)    Difficulty of Paying Living Expenses: Not hard at all  Food Insecurity: No Food Insecurity (02/23/2023)   Hunger Vital Sign    Worried About Running Out of Food in the Last Year: Never true    Ran Out of Food in the  Last Year: Never true  Transportation Needs: No Transportation Needs (02/23/2023)   PRAPARE - Hydrologist (Medical): No    Lack of Transportation (Non-Medical): No  Physical Activity: Inactive (02/15/2022)   Exercise Vital Sign    Days of Exercise per Week: 0 days    Minutes of Exercise per Session: 0 min  Stress: No Stress Concern Present (02/15/2022)   [place]    Feeling of Stress : Not at all  Social Connections: Moderately Integrated (02/15/2022)   Social Connection and Isolation Panel [NHANES]    Frequency of Communication with Friends and Family: More than three times a week    Frequency of Social Gatherings with Friends and Family: More than three times a week    Attends Religious Services: More than 4 times per year    Active Member of [employer] or Organizations: Yes    Attends Music therapist: More than 4 times per year    Marital Status: Divorced    Tobacco Counseling Counseling given: Not Answered   Clinical Intake:  Pre-visit preparation completed: Yes  Pain : No/denies pain  Nutritional Risks: Nausea/ vomitting/ diarrhea Diabetes: Yes CBG done?: No Did pt. bring in CBG monitor from home?: No  How often do you need to have someone help you when you read instructions, pamphlets, or other written materials from your doctor or pharmacy?: 1 - Never  Activities of Daily Living    02/23/2023   10:32 AM  In your present state of health, do you have any difficulty performing the following activities:  Hearing? 0  Vision? 1  Comment floaters  Difficulty concentrating or making decisions? 1  Walking or climbing stairs? 0  Dressing or bathing? 0  Doing errands, shopping? 0  Preparing Food and eating ? N  Using the Toilet?  N  In the past six months, have you accidently leaked urine? Y  Do you have problems with loss of bowel control? Y  Comment occasionally   Managing your Medications? N  Managing your Finances? N  Housekeeping or managing your Housekeeping? N    Patient Care Team: Carollee Herter, Alferd Apa, DO as PCP - General Vickie Epley, MD as PCP - Electrophysiology (Cardiology) Gaynelle Arabian, MD as Consulting Physician (Orthopedic Surgery) Marica Otter, [place] as Consulting Physician (Optometry) Sharyn Lull [place] as Consulting Physician (Dentistry) Marica Otter, OD (Optometry) Benjamine Mola, Resa Miner, MD as Consulting Physician (Rheumatology)  Indicate any recent Medical Services you may have received from other than [facility] providers in the past year (date may be approximate).     Assessment:   This is a routine wellness examination for Stacy Ayers.  Hearing/Vision screen No results found.  Dietary issues and exercise activities discussed: Current Exercise Habits: The patient does not participate in regular exercise at present, Exercise limited by: None identified   Goals Addressed   None    Depression Screen    02/23/2023   10:31 AM 01/14/2023   10:26 AM 11/02/2022    4:09 PM 02/15/2022    2:46 PM 07/24/2021   10:20 AM 04/28/2021    1:37 PM 06/09/2020    3:36 PM  PHQ 2/9 Scores  PHQ - 2 Score 0 3 1 0 1 0 0  PHQ- 9 Score  12 2        Fall Risk    02/23/2023   10:26 AM 11/02/2022    4:08 PM 02/15/2022    2:44 PM 07/24/2021   10:20 AM 07/16/2020    3:58 PM  [place] in the past year? 0 0 0 0 0  Number falls in past yr: 0 0 0 0 0  Injury with Fall? 0 0 0 0 0  Risk for fall due to : No Fall Risks   Impaired balance/gait   Follow up Falls evaluation completed Falls evaluation completed Falls prevention discussed      FALL RISK PREVENTION PERTAINING TO THE HOME:  Any stairs in or around the home? Yes  If so, are there any without handrails? No  Home free of loose throw rugs in walkways, pet beds, electrical cords, etc? Yes  Adequate lighting in your home to reduce risk of falls? Yes   ASSISTIVE DEVICES UTILIZED TO  PREVENT FALLS:  Life alert? No  Use of a cane, walker or w/c? No  Grab bars in the bathroom? Yes  Shower chair or bench in shower? No  Elevated toilet seat or a handicapped toilet? Yes   TIMED UP AND GO:  Was the test performed?  No, audio visit .    Cognitive Function:    01/14/2023   10:30 AM 10/25/2017    3:21 PM 08/06/2016   11:03 AM 08/08/2015   10:15 AM  MMSE - Mini Mental State Exam  Orientation to time 5 5 5 5   Orientation to Place 5 5 5 5   Registration 3 3 3 3   Attention/ Calculation 5 5 5 5   Recall 3 3 3 3   Language- name 2 objects 2 2 2 2   Language- repeat 1 1 1 1   Language- follow 3 step command 3 3 3 3   Language- read & follow direction 1 1 1 1   Write a sentence 1 1 1 1   Copy design 1 1 1 1   Total score 30 30 30  30  02/23/2023   10:41 AM  6CIT Screen  What Year? 0 points  What month? 0 points  What time? 0 points  Count back from 20 0 points  Months in reverse 0 points  Repeat phrase 0 points  Total Score 0 points    Immunizations Immunization History  Administered Date(s) Administered   Fluad Quad(high Dose 65+) 10/12/2021, 09/03/2022   Influenza Split 09/08/2011, 08/25/2012   Influenza Whole 08/26/2010   Influenza, High Dose Seasonal PF 08/06/2016, 08/05/2017, 10/31/2018   Influenza, Quadrivalent, Recombinant, Inj, Pf 09/07/2019   Influenza,inj,Quad PF,6+ Mos 08/09/2014, 08/08/2015   Influenza,inj,quad, With Preservative 08/01/2017   Moderna Covid-19 Vaccine Bivalent Booster 35yrs & up 11/17/2021   Moderna Sars-Covid-2 Vaccination 12/11/2019, 01/08/2020, 10/14/2020, 03/03/2021   Pneumococcal Conjugate-13 12/27/2014   Pneumococcal Polysaccharide-23 08/05/2010, 08/05/2017   Tdap 07/12/2011   Zoster Recombinat (Shingrix) 09/07/2019   Zoster, Live 07/21/2011    TDAP status: Due, Education has been provided regarding the importance of this vaccine. Advised may receive this vaccine at local pharmacy or Health Dept. Aware to provide a copy of  the vaccination record if obtained from local pharmacy or Health Dept. Verbalized acceptance and understanding.  Flu Vaccine status: Up to date  Pneumococcal vaccine status: Up to date  Covid-19 vaccine status: Information provided on how to obtain vaccines.   Qualifies for Shingles Vaccine? Yes   Zostavax completed Yes   Shingrix Completed?: No.    Education has been provided regarding the importance of this vaccine. Patient has been advised to call insurance company to determine out of pocket expense if they have not yet received this vaccine. Advised may also receive vaccine at local pharmacy or Health Dept. Verbalized acceptance and understanding.  Screening Tests Health Maintenance  Topic Date Due   OPHTHALMOLOGY EXAM  07/21/2018   Zoster Vaccines- Shingrix (2 of 2) 11/02/2019   DTaP/Tdap/Td (2 - Td or Tdap) 07/11/2021   COVID-19 Vaccine (6 - 2023-24 season) 07/30/2022   Medicare Annual Wellness (AWV)  02/16/2023   HEMOGLOBIN A1C  05/04/2023   Diabetic kidney evaluation - Urine ACR  11/03/2023   FOOT EXAM  11/03/2023   Diabetic kidney evaluation - eGFR measurement  01/15/2024   Pneumonia Vaccine 16+ Years old  Completed   INFLUENZA VACCINE  Completed   DEXA SCAN  Completed   Hepatitis C Screening  Completed   HPV VACCINES  Aged Out   COLONOSCOPY (Pts 45-49yrs Insurance coverage will need to be confirmed)  Discontinued    Health Maintenance  Health Maintenance Due  Topic Date Due   OPHTHALMOLOGY EXAM  07/21/2018   Zoster Vaccines- Shingrix (2 of 2) 11/02/2019   DTaP/Tdap/Td (2 - Td or Tdap) 07/11/2021   COVID-19 Vaccine (6 - 2023-24 season) 07/30/2022   Medicare Annual Wellness (AWV)  02/16/2023    Colorectal cancer screening: No longer required.   Mammogram status: Completed 08/09/22. Repeat every year  Bone Density status: Completed 05/18/21. Results reflect: Bone density results: OSTEOPOROSIS. Repeat every 2 years.  Lung Cancer Screening: (Low Dose CT Chest  recommended if Age 30-80 years, 30 pack-year currently smoking OR have quit w/in 15years.) does not qualify.    Additional Screening:  Hepatitis C Screening: does not qualify  Vision Screening: Recommended annual ophthalmology exams for early detection of glaucoma and other disorders of the eye. Is the patient up to date with their annual eye exam?  Yes  Who is the provider or what is the name of the office in which the patient attends annual  eye exams? Dr. Marica Otter If pt is not established with a provider, would they like to be referred to a provider to establish care? No .   Dental Screening: Recommended annual dental exams for proper oral hygiene  Community Resource Referral / Chronic Care Management: CRR required this visit?  No   CCM required this visit?  No      Plan:     I have personally reviewed and noted the following in the patient's chart:   Medical and social history Use of alcohol, tobacco or illicit drugs  Current medications and supplements including opioid prescriptions. Patient is not currently taking opioid prescriptions. Functional ability and status Nutritional status Physical activity Advanced directives List of other physicians Hospitalizations, surgeries, and ER visits in previous 12 months Vitals Screenings to include cognitive, depression, and falls Referrals and appointments  In addition, I have reviewed and discussed with patient certain preventive protocols, quality metrics, and best practice recommendations. A written personalized care plan for preventive services as well as general preventive health recommendations were provided to patient.   Due to this being a telephonic visit, the after visit summary with patients personalized plan was offered to patient via mail or my-chart. Patient would like to access on my-chart.  Beatris Ship, [place]   02/23/2023   Nurse Notes: None

## 2023-04-18 ENCOUNTER — Telehealth: Payer: Self-pay | Admitting: Family Medicine

## 2023-04-18 NOTE — Telephone Encounter (Signed)
Please advise 

## 2023-04-18 NOTE — Telephone Encounter (Signed)
Patient states [place]'s going out of the country and would like some Urinary patches to be sent to her pharmacy. Sh stated she had talked to Dr. Laury Axon about them already. Please advise.

## 2023-04-19 ENCOUNTER — Other Ambulatory Visit: Payer: Self-pay | Admitting: Family Medicine

## 2023-04-19 DIAGNOSIS — N393 Stress incontinence (female) (male): Secondary | ICD-10-CM

## 2023-04-19 MED ORDER — OXYBUTYNIN 3.9 MG/24HR TD PTTW: 1.0000 | MEDICATED_PATCH | TRANSDERMAL | 12 refills | Status: DC

## 2023-04-19 NOTE — Telephone Encounter (Signed)
yes

## 2023-04-26 DIAGNOSIS — L570 Actinic keratosis: Secondary | ICD-10-CM | POA: Diagnosis not present

## 2023-04-26 DIAGNOSIS — Z85828 Personal history of other malignant neoplasm of skin: Secondary | ICD-10-CM | POA: Diagnosis not present

## 2023-04-26 DIAGNOSIS — L821 Other seborrheic keratosis: Secondary | ICD-10-CM | POA: Diagnosis not present

## 2023-04-26 DIAGNOSIS — D1801 Hemangioma of skin and subcutaneous tissue: Secondary | ICD-10-CM | POA: Diagnosis not present

## 2023-04-27 ENCOUNTER — Telehealth: Payer: Self-pay | Admitting: Family Medicine

## 2023-04-27 ENCOUNTER — Other Ambulatory Visit: Payer: Self-pay | Admitting: Family Medicine

## 2023-04-27 ENCOUNTER — Other Ambulatory Visit: Payer: Self-pay

## 2023-04-27 DIAGNOSIS — I10 Essential (primary) hypertension: Secondary | ICD-10-CM

## 2023-04-27 MED ORDER — METOPROLOL SUCCINATE ER 25 MG PO TB24: 25.0000 mg | ORAL_TABLET | Freq: Two times a day (BID) | ORAL | 1 refills | Status: DC

## 2023-04-27 NOTE — Telephone Encounter (Signed)
Rxs sent

## 2023-04-27 NOTE — Telephone Encounter (Signed)
Pt is leaving for [place] on Friday (5.29.24) and needs her medication by then.  Prescription Request  04/27/2023  Is this a "Controlled Substance" medicine? No  LOV: 01/14/2023  What is the name of the medication or equipment?   losartan (COZAAR) 50 MG tablet [[id number]]   SYNTHROID 125 MCG tablet [[id number]]   Have you contacted your pharmacy to request a refill? Yes   Which pharmacy would you like this sent to?  [facility] [place], [place] - [address] [facility] [address] [place] [address] Phone: [phone] Fax: [phone]  Patient notified that their request is being sent to the clinical staff for review and that they should receive a response within 2 business days.   Please advise at Mobile [phone] (mobile)

## 2023-04-27 NOTE — Telephone Encounter (Signed)
Pt's medication was sent to pt's pharmacy as requested. Confirmation received.  °

## 2023-04-28 ENCOUNTER — Other Ambulatory Visit: Payer: Self-pay | Admitting: Family Medicine

## 2023-04-28 DIAGNOSIS — U071 COVID-19: Secondary | ICD-10-CM

## 2023-05-12 DIAGNOSIS — M25552 Pain in left hip: Secondary | ICD-10-CM | POA: Diagnosis not present

## 2023-05-31 DIAGNOSIS — L821 Other seborrheic keratosis: Secondary | ICD-10-CM | POA: Diagnosis not present

## 2023-05-31 DIAGNOSIS — Z85828 Personal history of other malignant neoplasm of skin: Secondary | ICD-10-CM | POA: Diagnosis not present

## 2023-05-31 DIAGNOSIS — L570 Actinic keratosis: Secondary | ICD-10-CM | POA: Diagnosis not present

## 2023-06-30 ENCOUNTER — Other Ambulatory Visit: Payer: Self-pay

## 2023-07-19 ENCOUNTER — Other Ambulatory Visit: Payer: Self-pay | Admitting: Family Medicine

## 2023-07-19 DIAGNOSIS — I10 Essential (primary) hypertension: Secondary | ICD-10-CM

## 2023-07-21 ENCOUNTER — Other Ambulatory Visit: Payer: Self-pay

## 2023-07-21 ENCOUNTER — Telehealth: Payer: Self-pay | Admitting: Pharmacist

## 2023-07-21 ENCOUNTER — Other Ambulatory Visit (HOSPITAL_COMMUNITY): Payer: Self-pay

## 2023-07-21 DIAGNOSIS — M810 Age-related osteoporosis without current pathological fracture: Secondary | ICD-10-CM

## 2023-07-21 DIAGNOSIS — Z79899 Other long term (current) drug therapy: Secondary | ICD-10-CM

## 2023-07-21 MED ORDER — DENOSUMAB 60 MG/ML ~~LOC~~ SOSY
60.0000 mg | PREFILLED_SYRINGE | SUBCUTANEOUS | 1 refills | Status: DC
  Filled 2023-07-21: qty 1, 180d supply, fill #0

## 2023-07-21 NOTE — Telephone Encounter (Signed)
Patient due for Prolia on 07/24/23. Per test claim, copay is $64 through pharmacy benefit. Patient will need updated CBC and CMP (she's planning on coming to clinic tomorrow)  Patient comfortable w coapy. Rx sent to [facility] to be couriered to clinic  Chesley Mires, PharmD, MPH, BCPS, CPP Clinical Pharmacist (Rheumatology and Pulmonology)

## 2023-07-22 LAB — COMPREHENSIVE METABOLIC PANEL
AG Ratio: 1.8 (calc) (ref 1.0–2.5)
ALT: 21 U/L (ref 6–29)
AST: 23 U/L (ref 10–35)
Albumin: 4 g/dL (ref 3.6–5.1)
Alkaline phosphatase (APISO): 51 U/L (ref 37–153)
BUN: 14 mg/dL (ref 7–25)
CO2: 29 mmol/L (ref 20–32)
Calcium: 9.6 mg/dL (ref 8.6–10.4)
Chloride: 101 mmol/L (ref 98–110)
Creat: 0.69 mg/dL (ref 0.60–0.95)
Globulin: 2.2 g/dL (ref 1.9–3.7)
Glucose, Bld: 125 mg/dL — ABNORMAL HIGH (ref 65–99)
Potassium: 5.2 mmol/L (ref 3.5–5.3)
Sodium: 137 mmol/L (ref 135–146)
Total Bilirubin: 0.6 mg/dL (ref 0.2–1.2)
Total Protein: 6.2 g/dL (ref 6.1–8.1)

## 2023-07-22 LAB — CBC WITH DIFFERENTIAL/PLATELET
Absolute Monocytes: 328 {cells}/uL (ref 200–950)
Basophils Absolute: 59 {cells}/uL (ref 0–200)
Basophils Relative: 1.2 %
Eosinophils Absolute: 137 {cells}/uL (ref 15–500)
Eosinophils Relative: 2.8 %
HCT: 33 % — ABNORMAL LOW (ref 35.0–45.0)
Hemoglobin: 11.1 g/dL — ABNORMAL LOW (ref 11.7–15.5)
Lymphs Abs: 1646 {cells}/uL (ref 850–3900)
MCH: 33.8 pg — ABNORMAL HIGH (ref 27.0–33.0)
MCHC: 33.6 g/dL (ref 32.0–36.0)
MCV: 100.6 fL — ABNORMAL HIGH (ref 80.0–100.0)
MPV: 9.8 fL (ref 7.5–12.5)
Monocytes Relative: 6.7 %
Neutro Abs: 2729 {cells}/uL (ref 1500–7800)
Neutrophils Relative %: 55.7 %
Platelets: 188 10*3/uL (ref 140–400)
RBC: 3.28 10*6/uL — ABNORMAL LOW (ref 3.80–5.10)
RDW: 12.5 % (ref 11.0–15.0)
Total Lymphocyte: 33.6 %
WBC: 4.9 10*3/uL (ref 3.8–10.8)

## 2023-07-22 NOTE — Telephone Encounter (Signed)
Prolia received in the office and placed in the fridge.  °

## 2023-07-25 NOTE — Telephone Encounter (Signed)
Patient scheduled for Prolia appt tomorrow, 07/26/23.  Reviewed l;abs - hemoglobin low but stable. Glucose elvated at 125 but patient confirms she was not fasting. Calcium wnl to proceed with Prolia  Chesley Mires, PharmD, MPH, BCPS, CPP Clinical Pharmacist (Rheumatology and Pulmonology)

## 2023-07-26 ENCOUNTER — Ambulatory Visit: Payer: Medicare PPO | Attending: Internal Medicine | Admitting: Pharmacist

## 2023-07-26 DIAGNOSIS — M810 Age-related osteoporosis without current pathological fracture: Secondary | ICD-10-CM

## 2023-07-26 MED ORDER — DENOSUMAB 60 MG/ML ~~LOC~~ SOSY
60.0000 mg | PREFILLED_SYRINGE | Freq: Once | SUBCUTANEOUS | Status: AC
  Administered 2023-07-26: 60 mg via SUBCUTANEOUS

## 2023-07-26 NOTE — Progress Notes (Signed)
Pharmacy Note  Subjective:   Patient presents to clinic today to receive bi-annual dose of Prolia. Patient's last dose of Prolia was on 01/25/2023. Stacy Ayers has remained well these last 6 months. She did not report any complaints or concerns.  Patient running a fever or have signs/symptoms of infection? No  Patient currently on antibiotics for the treatment of infection? No  Patient had fall in the last 6 months?  No  If yes, did it require medical attention? No   She is not taking calcium supplements at the moment but is taking Vitamin D.   Objective: CMP     Component Value Date/Time   NA 137 07/21/2023 1152   NA 142 11/02/2021 0000   K 5.2 07/21/2023 1152   CL 101 07/21/2023 1152   CO2 29 07/21/2023 1152   GLUCOSE 125 (H) 07/21/2023 1152   BUN 14 07/21/2023 1152   BUN 8 11/02/2021 0000   CREATININE 0.69 07/21/2023 1152   CALCIUM 9.6 07/21/2023 1152   PROT 6.2 07/21/2023 1152   PROT 7.2 04/18/2017 1517   ALBUMIN 4.2 01/14/2023 1040   ALBUMIN 4.5 04/18/2017 1517   AST 23 07/21/2023 1152   ALT 21 07/21/2023 1152   ALKPHOS 49 01/14/2023 1040   BILITOT 0.6 07/21/2023 1152   BILITOT 0.2 04/18/2017 1517   GFRNONAA >60 10/31/2021 1006   GFRAA >60 07/09/2020 0859    CBC    Component Value Date/Time   WBC 4.9 07/21/2023 1152   RBC 3.28 (L) 07/21/2023 1152   HGB 11.1 (L) 07/21/2023 1152   HGB 12.6 05/17/2006 1039   HCT 33.0 (L) 07/21/2023 1152   HCT 36.5 05/17/2006 1039   PLT 188 07/21/2023 1152   PLT 155 05/17/2006 1039   MCV 100.6 (H) 07/21/2023 1152   MCV 90.5 05/17/2006 1039   MCH 33.8 (H) 07/21/2023 1152   MCHC 33.6 07/21/2023 1152   RDW 12.5 07/21/2023 1152   RDW 14.7 (H) 05/17/2006 1039   LYMPHSABS 1,646 07/21/2023 1152   LYMPHSABS 1.1 05/17/2006 1039   MONOABS 0.4 01/14/2023 1040   MONOABS 0.3 05/17/2006 1039   EOSABS 137 07/21/2023 1152   EOSABS 0.1 05/17/2006 1039   BASOSABS 59 07/21/2023 1152   BASOSABS 0.0 05/17/2006 1039    Lab Results  Component  Value Date   VD25OH 41.99 01/14/2023    T-score:  05/18/21 - BMD measured at Forearm Radius 33% is 0.546 g/cm2 with a T-score of -3.8.   Patient was due to repeat DEXA scan June 2024.   Assessment/Plan:   Reviewed importance of adequate dietary intake of calcium in addition to supplementation due to risk of hypocalcemia with Prolia.   Patient tolerated injection without issue.  Administrations This Visit     denosumab (PROLIA) injection 60 mg     Admin Date 07/26/2023 Action Given Dose 60 mg Route Subcutaneous Documented By Gwenlyn Found, RPH             Patient's next Prolia dose is due on 01/26/2024.  Patient is due for updated DEXA ordered today for [facility].   All questions encouraged and answered.  Instructed patient to call with any further questions or concerns.  Sofie Rower, PharmD, [employer] PGY1

## 2023-07-28 ENCOUNTER — Other Ambulatory Visit: Payer: Self-pay | Admitting: Family Medicine

## 2023-07-28 DIAGNOSIS — F418 Other specified anxiety disorders: Secondary | ICD-10-CM

## 2023-08-04 DIAGNOSIS — H35462 Secondary vitreoretinal degeneration, left eye: Secondary | ICD-10-CM | POA: Diagnosis not present

## 2023-08-04 DIAGNOSIS — H43393 Other vitreous opacities, bilateral: Secondary | ICD-10-CM | POA: Diagnosis not present

## 2023-08-04 DIAGNOSIS — H43813 Vitreous degeneration, bilateral: Secondary | ICD-10-CM | POA: Diagnosis not present

## 2023-08-04 DIAGNOSIS — Q149 Congenital malformation of posterior segment of eye, unspecified: Secondary | ICD-10-CM | POA: Diagnosis not present

## 2023-08-04 DIAGNOSIS — H35372 Puckering of macula, left eye: Secondary | ICD-10-CM | POA: Diagnosis not present

## 2023-08-09 ENCOUNTER — Ambulatory Visit: Payer: Medicare PPO | Admitting: Family

## 2023-08-09 ENCOUNTER — Ambulatory Visit: Payer: Medicare PPO | Admitting: Family Medicine

## 2023-08-09 ENCOUNTER — Other Ambulatory Visit (HOSPITAL_BASED_OUTPATIENT_CLINIC_OR_DEPARTMENT_OTHER): Payer: Self-pay | Admitting: Family Medicine

## 2023-08-09 ENCOUNTER — Ambulatory Visit (HOSPITAL_BASED_OUTPATIENT_CLINIC_OR_DEPARTMENT_OTHER)
Admission: RE | Admit: 2023-08-09 | Discharge: 2023-08-09 | Disposition: A | Discharge status: Home / Self Care | Payer: Medicare PPO | Source: Ambulatory Visit | Attending: Internal Medicine | Admitting: Internal Medicine

## 2023-08-09 VITALS — BP 131/55 | HR 59 | Temp 98.3°F | Resp 16 | Wt 177.0 lb

## 2023-08-09 DIAGNOSIS — M810 Age-related osteoporosis without current pathological fracture: Secondary | ICD-10-CM | POA: Insufficient documentation

## 2023-08-09 DIAGNOSIS — R5383 Other fatigue: Secondary | ICD-10-CM | POA: Diagnosis not present

## 2023-08-09 DIAGNOSIS — E1151 Type 2 diabetes mellitus with diabetic peripheral angiopathy without gangrene: Secondary | ICD-10-CM | POA: Diagnosis not present

## 2023-08-09 DIAGNOSIS — J45909 Unspecified asthma, uncomplicated: Secondary | ICD-10-CM | POA: Diagnosis not present

## 2023-08-09 DIAGNOSIS — Z23 Encounter for immunization: Secondary | ICD-10-CM

## 2023-08-09 DIAGNOSIS — I10 Essential (primary) hypertension: Secondary | ICD-10-CM

## 2023-08-09 DIAGNOSIS — Z1231 Encounter for screening mammogram for malignant neoplasm of breast: Secondary | ICD-10-CM

## 2023-08-09 DIAGNOSIS — Z780 Asymptomatic menopausal state: Secondary | ICD-10-CM | POA: Diagnosis not present

## 2023-08-09 LAB — COMPREHENSIVE METABOLIC PANEL
ALT: 22 U/L (ref 0–35)
AST: 21 U/L (ref 0–37)
Albumin: 4.1 g/dL (ref 3.5–5.2)
Alkaline Phosphatase: 62 U/L (ref 39–117)
BUN: 11 mg/dL (ref 6–23)
CO2: 32 meq/L (ref 19–32)
Calcium: 9.3 mg/dL (ref 8.4–10.5)
Chloride: 101 meq/L (ref 96–112)
Creatinine, Ser: 0.64 mg/dL (ref 0.40–1.20)
GFR: 83.59 mL/min (ref >60.00)
Glucose, Bld: 97 mg/dL (ref 70–99)
Potassium: 4.8 meq/L (ref 3.5–5.1)
Sodium: 138 meq/L (ref 135–145)
Total Bilirubin: 0.6 mg/dL (ref 0.2–1.2)
Total Protein: 6.3 g/dL (ref 6.0–8.3)

## 2023-08-09 LAB — CBC
HCT: 35.1 % — ABNORMAL LOW (ref 36.0–46.0)
Hemoglobin: 11.6 g/dL — ABNORMAL LOW (ref 12.0–15.0)
MCHC: 33.2 g/dL (ref 30.0–36.0)
MCV: 101.1 fl — ABNORMAL HIGH (ref 78.0–100.0)
Platelets: 200 10*3/uL (ref 150.0–400.0)
RBC: 3.47 Mil/uL — ABNORMAL LOW (ref 3.87–5.11)
RDW: 13.9 % (ref 11.5–15.5)
WBC: 5.2 10*3/uL (ref 4.0–10.5)

## 2023-08-09 LAB — VITAMIN B12: Vitamin B-12: 529 pg/mL (ref 211–911)

## 2023-08-09 LAB — VITAMIN D 25 HYDROXY (VIT D DEFICIENCY, FRACTURES): VITD: 82.68 ng/mL (ref 30.00–100.00)

## 2023-08-09 LAB — TSH: TSH: 2.65 u[IU]/mL (ref 0.35–5.50)

## 2023-08-09 NOTE — Assessment & Plan Note (Signed)
Been stable since gastric bypass surgery

## 2023-08-09 NOTE — Progress Notes (Signed)
Subjective:     Patient ID: Stacy Ayers, female    DOB: [birth year], 82 y.o.   MRN: [id number]  Chief Complaint  Patient presents with   Fatigue    Complains of feeling more tired since starting effexor a few months ago   Otalgia    Complains of right ear ache on and off. Feeling of "having something in there"    Otalgia     Discussed the use of AI scribe software for clinical note transcription with the patient, who gave verbal consent to proceed.  History of Present Illness   The patient, with a history of anxiety and gastric bypass, presents with new onset fatigue which she believes began after an increase in her Effexor dose several months ago. She reports feeling the need to sleep all the time, to the point where she can no longer ignore it. Despite the fatigue, her mood is good and her anxiety is well controlled on the higher dose of effexor. She also reports occasional diarrhea, which she attributes to her gastric bypass surgery.  In addition to the fatigue, the patient has been experiencing some discomfort in her right ear that started the night before the visit. She has no shortness of breath, nausea, fever, or urinary symptoms beyond her usual incontinence, which she manages with pads and patches. She notes that her stools are black due to iron supplementation.  The patient also mentions that she has gained weight recently. She has a regular sleep schedule, but she has been taking naps during the day, which is unusual for her. She has a lot of responsibilities at [employer], but she also has days where she doesn't have a lot to do.          Health Maintenance Due  Topic Date Due   OPHTHALMOLOGY EXAM  07/21/2018   Zoster Vaccines- Shingrix (2 of 2) 11/02/2019   DTaP/Tdap/Td (2 - Td or Tdap) 07/11/2021   HEMOGLOBIN A1C  05/04/2023   INFLUENZA VACCINE  06/30/2023   COVID-19 Vaccine (6 - 2023-24 season) 07/31/2023    Past Medical History:  Diagnosis Date   Abnormal  liver function tests    Allergy    Anxiety    Arthritis    knees, lower back    Asthma    Cancer (HCC)    Cataract    left eye growing cataract    Depression    Diabetes mellitus    had gastric bypass DM resolved-    Diverticulosis    GERD (gastroesophageal reflux disease)    Headache    Previous migraines   Heart murmur    History of colon polyps    adenomatous   Hyperlipidemia    past hx- resloved after gastric bypass    Hypertension    Hypothyroidism    IBS (irritable bowel syndrome)    Iron deficiency anemia    Osteoporosis     Past Surgical History:  Procedure Laterality Date   ABDOMINAL HYSTERECTOMY     ABDOMINAL HYSTERECTOMY     APPENDECTOMY     BLADDER REPAIR     BREAST BIOPSY Right    needle core biopsy, benign   CARPAL TUNNEL RELEASE Left    Hand   CATARACT EXTRACTION Left    CHOLECYSTECTOMY     COLONOSCOPY     CORONARY ANGIOPLASTY     GASTRIC BYPASS     POLYPECTOMY     REVERSE SHOULDER ARTHROPLASTY Right 07/15/2020   Procedure: REVERSE SHOULDER ARTHROPLASTY;  Surgeon: Francena Hanly, MD;  Location: [facility];  Service: Orthopedics;  Laterality: Right;    TONSILLECTOMY AND ADENOIDECTOMY     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   Procedure: TOTAL KNEE ARTHROPLASTY;  Surgeon: Ollen Gross, MD;  Location: [facility];  Service: Orthopedics;  Laterality: Left;    Family History  Problem Relation Age of Onset   Stroke Mother    Asthma Mother    Anuerysm Mother    Obesity Father    Diabetes Sister    Heart disease Maternal Grandmother    Angina Maternal Grandmother    Suicidality Maternal Grandfather    Cancer Paternal Grandmother    Alzheimer's disease Paternal Grandmother    Alzheimer's disease Paternal Aunt    Colon cancer Neg Hx    Colon polyps Neg Hx    Esophageal cancer Neg Hx    Rectal cancer Neg Hx    Stomach cancer Neg Hx    Pancreatic cancer Neg Hx    Liver disease Neg Hx     Social History   Socioeconomic History   Marital  status: Single    Spouse name: Not on file   Number of children: 1   Years of education: Not on file   Highest education level: Not on file  Occupational History   Occupation: retired Magazine features editor: RETIRED  Tobacco Use   Smoking status: Former    Current packs/day: 0.00    Average packs/day: 0.3 packs/day for 2.0 years (0.6 ttl pk-yrs)    Types: Cigarettes    Start date: 07/11/1960    Quit date: 07/11/1962    Years since quitting: 61.1    Passive exposure: Past   Smokeless tobacco: Never  Vaping Use   Vaping status: Never Used  Substance and Sexual Activity   Alcohol use: Not Currently   Drug use: No   Sexual activity: Yes    Birth control/protection: Surgical    Comment: Hysterectomy  Other Topics Concern   Not on file  Social History Narrative   Daily caffeine   Social Determinants of Health   Financial Resource Strain: Low Risk  (02/15/2022)   Overall Financial Resource Strain (CARDIA)    Difficulty of Paying Living Expenses: Not hard at all  Food Insecurity: No Food Insecurity (02/23/2023)   Hunger Vital Sign    Worried About Running Out of Food in the Last Year: Never true    Ran Out of Food in the Last Year: Never true  Transportation Needs: No Transportation Needs (02/23/2023)   PRAPARE - Administrator, Civil Service (Medical): No    Lack of Transportation (Non-Medical): No  Physical Activity: Inactive (02/15/2022)   Exercise Vital Sign    Days of Exercise per Week: 0 days    Minutes of Exercise per Session: 0 min  Stress: No Stress Concern Present (02/15/2022)   [employer] of Occupational Health - Occupational Stress Questionnaire    Feeling of Stress : Not at all  Social Connections: Unknown (04/13/2022)   Received from [facility]   Social Network    Social Network: Not on file  Intimate Partner Violence: Not At Risk (02/23/2023)   Humiliation, Afraid, Rape, and Kick questionnaire    Fear of Current or Ex-Partner: No     Emotionally Abused: No    Physically Abused: No    Sexually Abused: No    Outpatient Medications Prior to Visit  Medication Sig Dispense Refill   acetaminophen (TYLENOL) 650  MG CR tablet Take 650 mg by mouth every 8 (eight) hours as needed for pain.     albuterol (VENTOLIN HFA) 108 (90 Base) MCG/ACT inhaler INHALE TWO PUFFS INTO THE LUNGS EVERY 6 HOURS AS NEEDED 8.5 g 3   CRANBERRY PO Take by mouth.     Cyanocobalamin (VITAMIN B-12 PO) Take by mouth.     denosumab (PROLIA) 60 MG/ML SOSY injection Inject 60 mg into the skin every 6 (six) months. Courier to rheum: [address], [address], [place] [address]. Appt on 07/28/23 1 mL 1   diclofenac Sodium (VOLTAREN) 1 % GEL Apply 2 g topically 4 (four) times daily. Do not apply to open area, apply to knee pain area     Ferrous Sulfate (IRON PO) Take by mouth.     ferrous sulfate 325 (65 FE) MG tablet Take 325 mg by mouth daily with breakfast.     fluticasone (FLONASE) 50 MCG/ACT nasal spray USE 2 SPRAYS IN EACH NOSTRIL ONCE DAILY 16 g 5   Loperamide HCl (ANTI-DIARRHEAL PO) Take by mouth.     losartan (COZAAR) 50 MG tablet Take 1 tablet (50 mg total) by mouth daily. 30 tablet 0   metoprolol succinate (TOPROL XL) 25 MG 24 hr tablet Take 1 tablet (25 mg total) by mouth 2 (two) times daily after a meal. 180 tablet 1   metoprolol tartrate (LOPRESSOR) 25 MG tablet Take 1 tablet (25 mg total) by mouth 3 (three) times daily as needed (palpitations). 90 tablet 3   oxybutynin (OXYTROL) 3.9 MG/24HR Place 1 patch onto the skin every 3 (three) days. 8 patch 12   pantoprazole (PROTONIX) 40 MG tablet TAKE 1 TABLET ONCE DAILY. 90 tablet 1   SYNTHROID 125 MCG tablet Take 1 tablet (125 mcg total) by mouth daily before breakfast. 30 tablet 0   venlafaxine XR (EFFEXOR-XR) 150 MG 24 hr capsule Take 1 capsule (150 mg total) by mouth daily with breakfast. 30 capsule 0   EPINEPHrine (EPIPEN 2-PAK) 0.3 mg/0.3 mL IJ SOAJ injection Inject 0.3 mg into the muscle as  needed for anaphylaxis. (Patient not taking: Reported on 01/25/2023) 1 each 1   Vitamin D, Ergocalciferol, (DRISDOL) 1.25 MG (50000 UNIT) CAPS capsule Take 1 capsule (50,000 Units total) by mouth every 7 (seven) days. 12 capsule 0   No facility-administered medications prior to visit.    Allergies  Allergen Reactions   Sulfonamide Derivatives Diarrhea   Cocaine     Other reaction(s): Hallucination Sinus irrigation   Montelukast Sodium     UNKNOWN   Penicillins     Local skin reaction.  Tolerated ancef 07/15/2020 Tolerated Cephalosporin Date: 10/20/21.     Pioglitazone     UNKNOWN    Rosiglitazone Maleate     UNKNOWN   Secobarbital Sodium     UNKNOWN   Cefdinir Palpitations    Possible reaction    Codeine Rash   Doxycycline Nausea Only    REACTION: GI UPSET    Review of Systems  HENT:  Positive for ear pain.    See HPI    Objective:    Physical Exam Constitutional:      General: She is not in acute distress.    Appearance: Normal appearance. She is well-developed.  HENT:     Head: Normocephalic and atraumatic.     Right Ear: Ear canal and external ear normal. Tympanic membrane is retracted.     Left Ear: Ear canal and external ear normal. Tympanic membrane is retracted.  Eyes:     General: No scleral icterus. Neck:     Thyroid: No thyromegaly.  Cardiovascular:     Rate and Rhythm: Normal rate and regular rhythm.     Heart sounds: Normal heart sounds. No murmur heard. Pulmonary:     Effort: Pulmonary effort is normal. No respiratory distress.     Breath sounds: Normal breath sounds. No wheezing.  Musculoskeletal:     Cervical back: Neck supple.  Skin:    General: Skin is warm and dry.  Neurological:     Mental Status: She is alert and oriented to person, place, and time.  Psychiatric:        Mood and Affect: Mood normal.        Behavior: Behavior normal.        Thought Content: Thought content normal.        Judgment: Judgment normal.      BP (!)  131/55 (BP Location: Right Arm, Patient Position: Sitting, Cuff Size: Small)   Pulse (!) 59   Temp 98.3 F (36.8 C) (Oral)   Resp 16   Wt 177 lb (80.3 kg)   SpO2 98%   BMI 27.72 kg/m  Wt Readings from Last 3 Encounters:  08/09/23 177 lb (80.3 kg)  01/25/23 171 lb (77.6 kg)  01/14/23 171 lb 6.4 oz (77.7 kg)       Assessment & Plan:   Problem List Items Addressed This Visit       Unprioritized   Fatigue    New.  Will obtain baseline labs.  If normal, will plan to decrease her effexor dose to see if this helps.       Relevant Orders   TSH   B12   Vitamin D (25 hydroxy)   CBC   Comp Met (CMET)   Essential hypertension - Primary    Stable. Continue on Lopressor at this time.       DM (diabetes mellitus) type II, controlled, with peripheral vascular disorder (HCC)    Been stable since gastric bypass surgery       Asthma    Stable and doing well with Albuterol       Flu shot today.  I have discontinued Renea Ee A. Lofaso "Mandy"'s Vitamin D (Ergocalciferol) and EPINEPHrine. I am also having her maintain her acetaminophen, ferrous sulfate, diclofenac Sodium, metoprolol tartrate, fluticasone, CRANBERRY PO, Cyanocobalamin (VITAMIN B-12 PO), Ferrous Sulfate (IRON PO), Loperamide HCl (ANTI-DIARRHEAL PO), pantoprazole, oxybutynin, metoprolol succinate, albuterol, losartan, Synthroid, denosumab, and venlafaxine XR.  No orders of the defined types were placed in this encounter.

## 2023-08-09 NOTE — Assessment & Plan Note (Signed)
Stable and doing well with Albuterol

## 2023-08-09 NOTE — Progress Notes (Signed)
Subjective:     Patient ID: Stacy Ayers, female    DOB: 1940/12/01, 82 y.o.   MRN: [id number]  Chief Complaint  Patient presents with   Fatigue    Complains of feeling more tired since starting effexor a few months ago   Otalgia    Complains of right ear ache on and off. Feeling of "having something in there"    Otalgia  Pertinent negatives include no abdominal pain, diarrhea, headaches, neck pain, rash or sore throat.    Discussed the use of AI scribe software for clinical note transcription with the patient, who gave verbal consent to proceed.  82 year old female presents to the office today with feeling more fatigued over the last month and right ear pain.  She states that hen effexor was doubled  from 75mg  to 150mg  last office visit  in August, that is when she noticed the more fatigued feeling. She states she takes the medication at night due to the education she got last office visit about the medication can make her sleepy, but she states she is still feeling run down and just tired.   Irratiation and pain in right ear that started last night. She felt like a bug was in the ear.       Health Maintenance Due  Topic Date Due   OPHTHALMOLOGY EXAM  07/21/2018   Zoster Vaccines- Shingrix (2 of 2) 11/02/2019   DTaP/Tdap/Td (2 - Td or Tdap) 07/11/2021   HEMOGLOBIN A1C  05/04/2023   INFLUENZA VACCINE  06/30/2023   COVID-19 Vaccine (6 - 2023-24 season) 07/31/2023    Past Medical History:  Diagnosis Date   Abnormal liver function tests    Allergy    Anxiety    Arthritis    knees, lower back    Asthma    Cancer (HCC)    Cataract    left eye growing cataract    Depression    Diabetes mellitus    had gastric bypass DM resolved-    Diverticulosis    GERD (gastroesophageal reflux disease)    Headache    Previous migraines   Heart murmur    History of colon polyps    adenomatous   Hyperlipidemia    past hx- resloved after gastric bypass    Hypertension     Hypothyroidism    IBS (irritable bowel syndrome)    Iron deficiency anemia    Osteoporosis     Past Surgical History:  Procedure Laterality Date   ABDOMINAL HYSTERECTOMY     ABDOMINAL HYSTERECTOMY     APPENDECTOMY     BLADDER REPAIR     BREAST BIOPSY Right    needle core biopsy, benign   CARPAL TUNNEL RELEASE Left    Hand   CATARACT EXTRACTION Left    CHOLECYSTECTOMY     COLONOSCOPY     CORONARY ANGIOPLASTY     GASTRIC BYPASS     POLYPECTOMY     REVERSE SHOULDER ARTHROPLASTY Right 07/15/2020   Procedure: REVERSE SHOULDER ARTHROPLASTY;  Surgeon: Francena Hanly, MD;  Location: [facility];  Service: Orthopedics;  Laterality: Right;    TONSILLECTOMY AND ADENOIDECTOMY     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   Procedure: TOTAL KNEE ARTHROPLASTY;  Surgeon: Ollen Gross, MD;  Location: [facility];  Service: Orthopedics;  Laterality: Left;    Family History  Problem Relation Age of Onset   Stroke Mother    Asthma Mother    Anuerysm Mother  Obesity Father    Diabetes Sister    Heart disease Maternal Grandmother    Angina Maternal Grandmother    Suicidality Maternal Grandfather    Cancer Paternal Grandmother    Alzheimer's disease Paternal Grandmother    Alzheimer's disease Paternal Aunt    Colon cancer Neg Hx    Colon polyps Neg Hx    Esophageal cancer Neg Hx    Rectal cancer Neg Hx    Stomach cancer Neg Hx    Pancreatic cancer Neg Hx    Liver disease Neg Hx     Social History   Socioeconomic History   Marital status: Single    Spouse name: Not on file   Number of children: 1   Years of education: Not on file   Highest education level: Not on file  Occupational History   Occupation: retired Magazine features editor: RETIRED  Tobacco Use   Smoking status: Former    Current packs/day: 0.00    Average packs/day: 0.3 packs/day for 2.0 years (0.6 ttl pk-yrs)    Types: Cigarettes    Start date: 07/11/1960    Quit date: 07/11/1962    Years since quitting: 61.1     Passive exposure: Past   Smokeless tobacco: Never  Vaping Use   Vaping status: Never Used  Substance and Sexual Activity   Alcohol use: Not Currently   Drug use: No   Sexual activity: Yes    Birth control/protection: Surgical    Comment: Hysterectomy  Other Topics Concern   Not on file  Social History Narrative   Daily caffeine   Social Determinants of Health   Financial Resource Strain: Low Risk  (02/15/2022)   Overall Financial Resource Strain (CARDIA)    Difficulty of Paying Living Expenses: Not hard at all  Food Insecurity: No Food Insecurity (02/23/2023)   Hunger Vital Sign    Worried About Running Out of Food in the Last Year: Never true    Ran Out of Food in the Last Year: Never true  Transportation Needs: No Transportation Needs (02/23/2023)   PRAPARE - Administrator, Civil Service (Medical): No    Lack of Transportation (Non-Medical): No  Physical Activity: Inactive (02/15/2022)   Exercise Vital Sign    Days of Exercise per Week: 0 days    Minutes of Exercise per Session: 0 min  Stress: No Stress Concern Present (02/15/2022)   [employer] of Occupational Health - Occupational Stress Questionnaire    Feeling of Stress : Not at all  Social Connections: Unknown (04/13/2022)   Received from [facility]   Social Network    Social Network: Not on file  Intimate Partner Violence: Not At Risk (02/23/2023)   Humiliation, Afraid, Rape, and Kick questionnaire    Fear of Current or Ex-Partner: No    Emotionally Abused: No    Physically Abused: No    Sexually Abused: No    Outpatient Medications Prior to Visit  Medication Sig Dispense Refill   acetaminophen (TYLENOL) 650 MG CR tablet Take 650 mg by mouth every 8 (eight) hours as needed for pain.     albuterol (VENTOLIN HFA) 108 (90 Base) MCG/ACT inhaler INHALE TWO PUFFS INTO THE LUNGS EVERY 6 HOURS AS NEEDED 8.5 g 3   CRANBERRY PO Take by mouth.     Cyanocobalamin (VITAMIN B-12 PO) Take by  mouth.     denosumab (PROLIA) 60 MG/ML SOSY injection Inject 60 mg into the skin every 6 (six)  months. Courier to rheum: [address], [place] [address]. Appt on 07/28/23 1 mL 1   diclofenac Sodium (VOLTAREN) 1 % GEL Apply 2 g topically 4 (four) times daily. Do not apply to open area, apply to knee pain area     Ferrous Sulfate (IRON PO) Take by mouth.     ferrous sulfate 325 (65 FE) MG tablet Take 325 mg by mouth daily with breakfast.     fluticasone (FLONASE) 50 MCG/ACT nasal spray USE 2 SPRAYS IN EACH NOSTRIL ONCE DAILY 16 g 5   Loperamide HCl (ANTI-DIARRHEAL PO) Take by mouth.     losartan (COZAAR) 50 MG tablet Take 1 tablet (50 mg total) by mouth daily. 30 tablet 0   metoprolol succinate (TOPROL XL) 25 MG 24 hr tablet Take 1 tablet (25 mg total) by mouth 2 (two) times daily after a meal. 180 tablet 1   metoprolol tartrate (LOPRESSOR) 25 MG tablet Take 1 tablet (25 mg total) by mouth 3 (three) times daily as needed (palpitations). 90 tablet 3   oxybutynin (OXYTROL) 3.9 MG/24HR Place 1 patch onto the skin every 3 (three) days. 8 patch 12   pantoprazole (PROTONIX) 40 MG tablet TAKE 1 TABLET ONCE DAILY. 90 tablet 1   SYNTHROID 125 MCG tablet Take 1 tablet (125 mcg total) by mouth daily before breakfast. 30 tablet 0   venlafaxine XR (EFFEXOR-XR) 150 MG 24 hr capsule Take 1 capsule (150 mg total) by mouth daily with breakfast. 30 capsule 0   EPINEPHrine (EPIPEN 2-PAK) 0.3 mg/0.3 mL IJ SOAJ injection Inject 0.3 mg into the muscle as needed for anaphylaxis. (Patient not taking: Reported on 01/25/2023) 1 each 1   Vitamin D, Ergocalciferol, (DRISDOL) 1.25 MG (50000 UNIT) CAPS capsule Take 1 capsule (50,000 Units total) by mouth every 7 (seven) days. 12 capsule 0   No facility-administered medications prior to visit.    Allergies  Allergen Reactions   Sulfonamide Derivatives Diarrhea   Cocaine     Other reaction(s): Hallucination Sinus irrigation   Montelukast Sodium     UNKNOWN    Penicillins     Local skin reaction.  Tolerated ancef 07/15/2020 Tolerated Cephalosporin Date: 10/20/21.     Pioglitazone     UNKNOWN    Rosiglitazone Maleate     UNKNOWN   Secobarbital Sodium     UNKNOWN   Cefdinir Palpitations    Possible reaction    Codeine Rash   Doxycycline Nausea Only    REACTION: GI UPSET    Review of Systems  Constitutional:  Negative for chills, fever and weight loss.  HENT:  Positive for ear pain. Negative for congestion and sore throat.   Eyes:  Negative for pain and redness.  Gastrointestinal:  Negative for abdominal pain, diarrhea and nausea.  Genitourinary:  Negative for dysuria.  Musculoskeletal:  Negative for back pain and neck pain.  Skin:  Negative for itching and rash.  Neurological:  Negative for tingling and headaches.       Objective:    Physical Exam Constitutional:      Appearance: Normal appearance. She is normal weight.  HENT:     Head: Normocephalic.     Right Ear: Tympanic membrane, ear canal and external ear normal.     Ears:     Comments: Some redness noted on the inside of the right ear canal.     Nose: Nose normal.  Cardiovascular:     Rate and Rhythm: Normal rate and regular rhythm.  Pulmonary:     Effort: Pulmonary effort is normal.     Breath sounds: Normal breath sounds.  Musculoskeletal:        General: Normal range of motion.     Cervical back: Normal range of motion.  Skin:    General: Skin is warm.  Neurological:     General: No focal deficit present.     Mental Status: She is alert and oriented to person, place, and time. Mental status is at baseline.      BP (!) 131/55 (BP Location: Right Arm, Patient Position: Sitting, Cuff Size: Small)   Pulse (!) 59   Temp 98.3 F (36.8 C) (Oral)   Resp 16   Wt 177 lb (80.3 kg)   SpO2 98%   BMI 27.72 kg/m  Wt Readings from Last 3 Encounters:  08/09/23 177 lb (80.3 kg)  01/25/23 171 lb (77.6 kg)  01/14/23 171 lb 6.4 oz (77.7 kg)       Assessment  & Plan:   Problem List Items Addressed This Visit   None   I have discontinued Huyen A. Kaser "Mandy"'s Vitamin D (Ergocalciferol) and EPINEPHrine. I am also having her maintain her acetaminophen, ferrous sulfate, diclofenac Sodium, metoprolol tartrate, fluticasone, CRANBERRY PO, Cyanocobalamin (VITAMIN B-12 PO), Ferrous Sulfate (IRON PO), Loperamide HCl (ANTI-DIARRHEAL PO), pantoprazole, oxybutynin, metoprolol succinate, albuterol, losartan, Synthroid, denosumab, and venlafaxine XR.  No orders of the defined types were placed in this encounter.

## 2023-08-09 NOTE — Addendum Note (Signed)
Addended by: Wilford Corner on: 08/09/2023 10:51 AM   Modules accepted: Orders

## 2023-08-09 NOTE — Assessment & Plan Note (Signed)
Stable. Continue on Lopressor at this time.

## 2023-08-09 NOTE — Assessment & Plan Note (Signed)
New.  Will obtain baseline labs.  If normal, will plan to decrease her effexor dose to see if this helps.

## 2023-08-09 NOTE — Patient Instructions (Signed)
VISIT SUMMARY:  During your visit, we discussed your new onset of fatigue, discomfort in your right ear, and your ongoing urinary incontinence. We also talked about your general health maintenance.  YOUR PLAN:  -FATIGUE: Your fatigue might be related to the increased dose of your anxiety medication, Effexor. We will do some blood tests to check for other possible causes like thyroid problems. If these tests don't show anything, we might consider adjusting your Effexor dose.  -RIGHT EAR DISCOMFORT: Your right ear discomfort might be due to increased ear pressure. At this time, no immediate action is needed.  -URINARY INCONTINENCE: Your urinary incontinence is a long-term issue. You should continue with your current management plan.  -GENERAL HEALTH MAINTENANCE: We gave you the flu vaccine during your visit.  We encourage you to troubleshoot any issues you're having with accessing MyChart for better communication.  INSTRUCTIONS:  Please get your blood work done as soon as possible. We will review the results to determine the next steps for managing your fatigue. Continue to monitor your ear discomfort and urinary incontinence. If you notice any changes or if your symptoms worsen, please contact us immediately. Also, please try to resolve any issues you're having with accessing MyChart. This will help [place] communicate with you more effectively.

## 2023-08-10 ENCOUNTER — Telehealth: Payer: Self-pay | Admitting: Family

## 2023-08-10 DIAGNOSIS — L239 Allergic contact dermatitis, unspecified cause: Secondary | ICD-10-CM | POA: Diagnosis not present

## 2023-08-10 DIAGNOSIS — Z85828 Personal history of other malignant neoplasm of skin: Secondary | ICD-10-CM | POA: Diagnosis not present

## 2023-08-10 NOTE — Telephone Encounter (Signed)
B12 looks good.  Vit D is on the high side. Is she taking any vit D supplements? If so, please discontinue.   Thyroid testing looks good.   She remains mildly anemic but unchanged since it was last checked.

## 2023-08-11 ENCOUNTER — Telehealth: Payer: Self-pay | Admitting: Family

## 2023-08-11 NOTE — Telephone Encounter (Signed)
Please advise pt that since her lab work looks good, it seems reasonable to see if she is less fatigued on a lower dose of effexor.  I will send a new rx for effexor xr 75mg  once daily.  Let me know if she has any issues with mood on this decreased dose or if symptoms do not improve. I would like her to follow up with Dr. Laury Axon in 1 month.

## 2023-08-11 NOTE — Telephone Encounter (Signed)
Called patient but no answer, left voice mail for patient to call back.   

## 2023-08-12 NOTE — Telephone Encounter (Signed)
Patient notified of all results and provider's recommendations.

## 2023-08-14 MED ORDER — VENLAFAXINE HCL ER 75 MG PO CP24: 75.0000 mg | ORAL_CAPSULE | Freq: Every day | ORAL | 0 refills | Status: DC

## 2023-08-15 NOTE — Telephone Encounter (Signed)
Called patient but no answer, left voice mail for patient to call back.

## 2023-08-16 ENCOUNTER — Encounter (HOSPITAL_BASED_OUTPATIENT_CLINIC_OR_DEPARTMENT_OTHER): Payer: Self-pay

## 2023-08-16 ENCOUNTER — Ambulatory Visit (HOSPITAL_BASED_OUTPATIENT_CLINIC_OR_DEPARTMENT_OTHER)
Admission: RE | Admit: 2023-08-16 | Discharge: 2023-08-16 | Disposition: A | Discharge status: Home / Self Care | Payer: Medicare PPO | Source: Ambulatory Visit | Attending: Family Medicine | Admitting: Family Medicine

## 2023-08-16 DIAGNOSIS — Z1231 Encounter for screening mammogram for malignant neoplasm of breast: Secondary | ICD-10-CM | POA: Diagnosis not present

## 2023-08-17 NOTE — Telephone Encounter (Signed)
Patient notified of normal results and provider's recommendations. She will start the new dose and let us know if any changes in mood. She will call back to schedule a follow up with Dr. Laury Axon.

## 2023-08-25 ENCOUNTER — Encounter: Payer: Self-pay | Admitting: Family

## 2023-08-26 ENCOUNTER — Other Ambulatory Visit: Payer: Self-pay | Admitting: Family Medicine

## 2023-08-26 DIAGNOSIS — I10 Essential (primary) hypertension: Secondary | ICD-10-CM

## 2023-08-26 DIAGNOSIS — K219 Gastro-esophageal reflux disease without esophagitis: Secondary | ICD-10-CM

## 2023-09-26 ENCOUNTER — Other Ambulatory Visit: Payer: Self-pay | Admitting: Family Medicine

## 2023-09-26 DIAGNOSIS — I10 Essential (primary) hypertension: Secondary | ICD-10-CM

## 2023-09-28 ENCOUNTER — Telehealth: Payer: Self-pay | Admitting: Family Medicine

## 2023-09-28 NOTE — Telephone Encounter (Signed)
Pt called wanting to advise Dr. Laury Axon that she has stopped taking the Vitamin D and has stayed on the higher dosage for her antidepressant. Pt would like to have her Rx for the antidepressant rewritten to reflect this going forward.

## 2023-09-28 NOTE — Telephone Encounter (Signed)
Okay to send a new rx?

## 2023-10-03 MED ORDER — VENLAFAXINE HCL ER 150 MG PO CP24: 150.0000 mg | ORAL_CAPSULE | Freq: Every day | ORAL | 1 refills | Status: DC

## 2023-10-03 NOTE — Telephone Encounter (Signed)
Rx sent 

## 2023-10-03 NOTE — Addendum Note (Signed)
Addended by: Roxanne Gates on: 10/03/2023 03:21 PM   Modules accepted: Orders

## 2023-10-04 DIAGNOSIS — M65341 Trigger finger, right ring finger: Secondary | ICD-10-CM | POA: Diagnosis not present

## 2023-10-04 DIAGNOSIS — M65331 Trigger finger, right middle finger: Secondary | ICD-10-CM | POA: Diagnosis not present

## 2023-12-01 ENCOUNTER — Other Ambulatory Visit: Payer: Self-pay | Admitting: Family Medicine

## 2023-12-01 DIAGNOSIS — K219 Gastro-esophageal reflux disease without esophagitis: Secondary | ICD-10-CM

## 2023-12-27 ENCOUNTER — Telehealth: Payer: Self-pay

## 2023-12-27 ENCOUNTER — Other Ambulatory Visit: Payer: Self-pay | Admitting: Family Medicine

## 2023-12-27 DIAGNOSIS — N393 Stress incontinence (female) (male): Secondary | ICD-10-CM

## 2023-12-27 MED ORDER — OXYBUTYNIN CHLORIDE ER 10 MG PO TB24: 10.0000 mg | ORAL_TABLET | Freq: Every day | ORAL | 3 refills | Status: DC

## 2023-12-27 NOTE — Telephone Encounter (Signed)
Copied from CRM [id number]. Topic: Clinical - Medication Question >> Dec 27, 2023 12:44 PM Almira Coaster wrote: Reason for CRM: Patient is using oxybutynin (OXYTROL) 3.9 MG/24HR but they have started to give her rashes, patient was wondering if there is a pill that she can change to instead of using patches.

## 2024-01-04 ENCOUNTER — Other Ambulatory Visit (HOSPITAL_COMMUNITY): Payer: Self-pay

## 2024-01-04 ENCOUNTER — Other Ambulatory Visit: Payer: Self-pay

## 2024-01-06 ENCOUNTER — Other Ambulatory Visit (HOSPITAL_COMMUNITY): Payer: Self-pay

## 2024-01-09 ENCOUNTER — Other Ambulatory Visit (HOSPITAL_COMMUNITY): Payer: Self-pay

## 2024-01-09 ENCOUNTER — Other Ambulatory Visit: Payer: Self-pay

## 2024-01-10 NOTE — Progress Notes (Signed)
 Office Visit Note  Patient: Stacy Ayers             Date of Birth: 07-19-1941           MRN: [id number]             PCP: Donato Schultz, DO Referring: Donato Schultz, * Visit Date: 01/24/2024   Subjective:  Follow-up (Patient states she has had some lower back and neck pain. )   Discussed the use of AI scribe software for clinical note transcription with the patient, who gave verbal consent to proceed.  History of Present Illness   Stacy Ayers "Stacy Ayers" is an 83 year old female with osteoporosis who presents for follow-up on Prolia treatment.  She has been receiving Prolia for osteoporosis, which has effectively increased her bone density.  She has been in treatment from about 2018-2021 with a gap and from 2022-current. Her bone density scan in September showed improvement, with her hip T-score increasing from -1.9 in 2022 to -1.6, nearly returning to her 2020 level of -1.5. However, her forearm radius T-score improved to -3.6. She is due for her next Prolia dose in two days or after.  She experiences lower back and neck pain due to degenerative disc disease, which causes discomfort when getting up from a seated position, particularly in the evening. She uses a small pillow for support, which helps alleviate some discomfort. She has not been engaging in regular exercise but has recently started back with flex and flow exercises. She has access to various exercise equipment, including a treadmill and bicycle, at [employer].  She has a history of trigger finger, for which she received injections. The condition has improved, although the right third finger occasionally still triggers. She also notes stiffness in her replaced knee, which she manages with biking exercises.  Her vitamin D levels were noted to be high at 86, and she has adjusted her intake to one dose at night instead of two daily.    Previous HPI 01/25/2023 Stacy Ayers is a 83 y.o. female here for follow  up for osteoporosis on Prolia treatment.  Last bone density testing June 2022 with femur osteopenia worsening over time and distal radius in the osteoporotic range.  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  She recently is experiencing more trouble with dizziness and headaches.  More lightheadedness than vertigo.  She did also have some upper respiratory illness starting late in December and has a little bit of persistent pressure and congestion ongoing since then.  She denies any new falls or trouble with walking or lower extremity sensation.  Hands are doing pretty well since previous left carpal tunnel release surgery and right hand trigger finger injections does not have current complaints today. She had recent labs checked on February 16 with primary care office including metabolic panel with normal calcium and renal function and normal vitamin D level. She is scheduled for MRI her biggest anxiety is about increased trouble with memory since she has family history of Alzheimer's dementia.   Previous HPI 01/25/22 Stacy Ayers is a 83 y.o. female here for follow up for osteoporosis on prolia treatment. Her most recent labs have shown persistent mild hypocalcemia. She had a recent COVID illness earlier this month so delayed initial time for this. She had left knee replacement surgery in November this was mildly complicated by cardiac arrhythmia then also hospitalized again with abdominal pain thought  to be from lactic acidosis related to cystitis and improved after fluids and antibiotics treatment. She has lost weight 10-20 lbs throughout this process. She is graduating from physical therapy today. She has noticed some numbness and finger triggering in her hands first thing after waking and when driving. She had carpal tunnel syndrome years ago before her gastric bypass surgery treated with wrist splints but not since then.   Previous HPI 06/25/21 Stacy Ayers is a 83 y.o. 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  She has been on treatment with Prolia since around 4 years ago due to age-related osteoporosis identified on bone densitometry without any pathologic fractures.  She has been off the medication for about 1 year she is not entirely sure why some appointments were rescheduled related to COVID issues.  She believes was tolerating the medication well she had some generalized stiffness and body aches but these were chronic and have also continued in the past year with no medication.  She has a history of right shoulder reverse arthroplasty for osteoarthritis she is currently seeing Dr. Lequita Halt for left knee osteoarthritis considering arthroplasty.  She has had a fracture of her right fifth finger no other joint fractures.  She had a fall last year she reports as tripping when trying to walk to her door while distracted this was evaluated at the ED and no significant injury was sustained. She was not a candidate for oral antiresorptive therapy due to history of bariatric surgery.  She takes vitamin D supplementation regularly she does not take calcium regularly due to GI side effects so she does not absorb this well.  She reports a total height loss of about 0.75".   Review of Systems  Constitutional:  Positive for fatigue.  HENT:  Positive for mouth dryness. Negative for mouth sores.   Eyes:  Positive for dryness.  Respiratory:  Positive for shortness of breath.   Cardiovascular:  Positive for chest pain and palpitations.  Gastrointestinal:  Positive for constipation and diarrhea. Negative for blood in stool.  Endocrine: Negative for increased urination.  Genitourinary:  Positive for involuntary urination.  Musculoskeletal:  Positive for joint pain, joint pain, myalgias and myalgias. Negative for gait problem, joint swelling, muscle weakness, morning stiffness and muscle  tenderness.  Skin:  Positive for sensitivity to sunlight. Negative for color change, rash and hair loss.  Allergic/Immunologic: Negative for susceptible to infections.  Neurological:  Positive for dizziness and headaches.  Hematological:  Negative for swollen glands.  Psychiatric/Behavioral:  Positive for depressed mood and sleep disturbance. The patient is not nervous/anxious.     PMFS History:  Patient Active Problem List   Diagnosis Date Noted   Nonintractable episodic headache 01/14/2023   Memory loss 01/14/2023   Non-recurrent acute serous otitis media of both ears 12/06/2022   Vertigo 12/06/2022   Bronchitis 12/06/2022   Trigger finger of right hand 11/03/2022   Nut allergy 07/30/2022   Bee sting allergy 07/30/2022   Urinary tract infection without hematuria 03/26/2022   Hypocalcemia 01/25/2022   Pressure injury of skin 10/29/2021   Lactic acidosis 10/28/2021   Left knee pain 10/28/2021   Primary osteoarthritis of left knee 10/19/2021   Pre-op evaluation 10/12/2021   Depression, major, single episode, mild (HCC) 07/24/2021   Age-related osteoporosis without current pathological fracture 06/25/2021   Diabetes mellitus (HCC) 04/28/2021   DM (diabetes mellitus) type II, controlled, with peripheral vascular disorder (HCC) 04/28/2021  Morbid obesity (HCC) 04/28/2021   Anemia 04/28/2021   Fatigue 03/23/2021   Right ear pain 11/06/2020   Osteoarthritis of right knee 10/01/2020   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   OA (osteoarthritis) of knee 05/12/2018   Dysuria 05/03/2018   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   Seasonal allergies 08/11/2015   Alcohol dependence (HCC) 08/11/2015   CTS (carpal tunnel syndrome) 08/11/2015   Allergic rhinitis 12/19/2013   B12 deficiency 05/22/2013   Left foot pain 09/06/2012   Depression with anxiety 04/05/2012   Abdominal pain, generalized 01/18/2011   History of colonic polyps 01/18/2011   SPONTANEOUS  ECCHYMOSES 10/13/2010   DIARRHEA, CHRONIC 10/13/2010   PHARYNGITIS, CHRONIC 02/16/2010   Hypothyroidism 01/14/2010   History of bariatric surgery 02/25/2009   Vitamin D deficiency 09/10/2008   VARICOSE VEINS, LOWER EXTREMITIES 09/10/2008   PULMONARY NODULE 08/14/2008   FATIGUE 08/14/2008   MUSCLE PAIN 06/14/2007   BLADDER PAIN 06/14/2007   COLONIC POLYPS 03/14/2007   History of diet-controlled diabetes 03/14/2007   Hyperlipidemia LDL goal <100 03/14/2007   ANEMIA-IRON DEFICIENCY 03/14/2007   Essential hypertension 03/14/2007   VOCAL CORD PARALYSIS 03/14/2007   EDEMA, LARYNX 03/14/2007   Asthma 03/14/2007   Esophageal reflux 03/14/2007   LIVER FUNCTION TESTS, ABNORMAL, HX OF 03/14/2007   TONSILLECTOMY AND ADENOIDECTOMY, HX OF 03/14/2007    Past Medical History:  Diagnosis Date   Abnormal liver function tests    Allergy    Anxiety    Arthritis    knees, lower back    Asthma    Cancer (HCC)    Cataract    left eye growing cataract    Depression    Diabetes mellitus    had gastric bypass DM resolved-    Diverticulosis    GERD (gastroesophageal reflux disease)    Headache    Previous migraines   Heart murmur    History of colon polyps    adenomatous   Hyperlipidemia    past hx- resloved after gastric bypass    Hypertension    Hypothyroidism    IBS (irritable bowel syndrome)    Iron deficiency anemia    Osteoporosis     Family History  Problem Relation Age of Onset   Stroke Mother    Asthma Mother    Anuerysm Mother    Obesity Father    Diabetes Sister    Heart disease Maternal Grandmother    Angina Maternal Grandmother    Suicidality Maternal Grandfather    Cancer Paternal Grandmother    Alzheimer's disease Paternal Grandmother    Alzheimer's disease Paternal Aunt    Colon cancer Neg Hx    Colon polyps Neg Hx    Esophageal cancer Neg Hx    Rectal cancer Neg Hx    Stomach cancer Neg Hx    Pancreatic cancer Neg Hx    Liver disease Neg Hx    Past  Surgical History:  Procedure Laterality Date   ABDOMINAL HYSTERECTOMY     ABDOMINAL HYSTERECTOMY     APPENDECTOMY     BLADDER REPAIR     BREAST BIOPSY Right    needle core biopsy, benign   CARPAL TUNNEL RELEASE Left    Hand   CATARACT EXTRACTION Left    CHOLECYSTECTOMY     COLONOSCOPY     CORONARY ANGIOPLASTY     GASTRIC BYPASS     POLYPECTOMY     REVERSE SHOULDER ARTHROPLASTY Right 07/15/2020   Procedure: REVERSE SHOULDER ARTHROPLASTY;  Surgeon: Francena Hanly, MD;  Location: [facility];  Service: Orthopedics;  Laterality: Right;    TONSILLECTOMY AND ADENOIDECTOMY     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   Procedure: TOTAL KNEE ARTHROPLASTY;  Surgeon: Ollen Gross, MD;  Location: [facility];  Service: Orthopedics;  Laterality: Left;   Social History   Social History Narrative   Daily caffeine   Immunization History  Administered Date(s) Administered   Fluad Quad(high Dose 65+) 10/12/2021, 09/03/2022   Fluad Trivalent(High Dose 65+) 08/09/2023   Influenza Split 09/08/2011, 08/25/2012   Influenza Whole 08/26/2010   Influenza, High Dose Seasonal PF 08/06/2016, 08/05/2017, 10/31/2018   Influenza, Quadrivalent, Recombinant, Inj, Pf 09/07/2019   Influenza,inj,Quad PF,6+ Mos 08/09/2014, 08/08/2015   Influenza,inj,quad, With Preservative 08/01/2017   Moderna Covid-19 Vaccine Bivalent Booster 68yrs & up 11/17/2021   Moderna Sars-Covid-2 Vaccination 12/11/2019, 01/08/2020, 10/14/2020, 03/03/2021   Pneumococcal Conjugate-13 12/27/2014   Pneumococcal Polysaccharide-23 08/05/2010, 08/05/2017   Tdap 07/12/2011   Zoster Recombinant(Shingrix) 09/07/2019   Zoster, Live 07/21/2011     Objective: Vital Signs: BP 115/73 (BP Location: Left Arm, Patient Position: Sitting, Cuff Size: Large)   Pulse 63   Resp 12   Ht 5' 6.5" (1.689 m)   Wt 173 lb (78.5 kg)   BMI 27.50 kg/m    Physical Exam Cardiovascular:     Rate and Rhythm: Normal rate and regular rhythm.  Pulmonary:      Effort: Pulmonary effort is normal.     Breath sounds: Normal breath sounds.  Musculoskeletal:     Right lower leg: No edema.     Left lower leg: No edema.  Skin:    General: Skin is warm and dry.     Findings: No rash.  Neurological:     Mental Status: She is alert.  Psychiatric:        Mood and Affect: Mood normal.      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  Imaging: No results found.  Recent Labs: Lab Results  Component Value Date   WBC 5.2 08/09/2023   HGB 11.6 (L) 08/09/2023   PLT 200.0 08/09/2023   NA 138 08/09/2023   K 4.8 08/09/2023   CL 101 08/09/2023   CO2 32 08/09/2023   GLUCOSE 97 08/09/2023   BUN 11 08/09/2023   CREATININE 0.64 08/09/2023   BILITOT 0.6 08/09/2023   ALKPHOS 62 08/09/2023   AST 21 08/09/2023   ALT 22 08/09/2023   PROT 6.3 08/09/2023   ALBUMIN 4.1 08/09/2023   CALCIUM 9.3 08/09/2023   GFRAA >60 07/09/2020    Speciality Comments: No specialty comments available.  Procedures:  No procedures performed Allergies: Sulfonamide derivatives, Cocaine, Montelukast sodium, Penicillins, Pioglitazone, Rosiglitazone maleate, Secobarbital sodium, Cefdinir, Codeine, and Doxycycline   Assessment / Plan:     Visit Diagnoses: Age-related osteoporosis without current pathological fracture  Primary osteoarthritis of left knee - Plan: COMPLETE METABOLIC PANEL WITH GFR, VITAMIN D 25 Hydroxy (Vit-D Deficiency, Fractures) Improvement in bone density noted on recent DEXA scan,  likely secondary to Prolia therapy.  We discussed  in some detail about different weightbearing exercises beneficial for bone health as well as leg strengthening and gentle for her knee arthritis.  We reviewed recent bone density findings in detail and although cannot be measured anticipate at least similar vertebral density response given Prolia mechanism of action. -Checking metabolic panel for monitoring GFR and serum calcium -Continue Prolia treatment every 6 months, next dose due end of this month or later -Encourage weight-bearing exercises for bone health.  Vitamin D deficiency Latest vitamin D level pretty high at 64 she is adjusted her daily supplement to decrease this. -Rechecking serum vitamin D  Trigger Finger Recent injections by Dr. Yehuda Budd have provided some relief, though occasional symptoms persist in the right third finger.  Degenerative Disc Disease Reports of back pain, particularly in the evening after sitting for prolonged periods. No acute distress noted on examination. -Encourage regular exercise, particularly weight-bearing activities for bone health. -Consider further evaluation or formal physical therapy if pain worsens or becomes unmanageable.    Orders: Orders Placed This Encounter  Procedures   COMPLETE METABOLIC PANEL WITH GFR   VITAMIN D 25 Hydroxy (Vit-D Deficiency, Fractures)   No orders of the defined types were placed in this encounter.    Follow-Up Instructions: Return in about 1 year (around 01/23/2025) for OP on prolia f/u 38yr.   Fuller Plan, MD  Note - This record has been created using [employer].  Chart creation errors have been sought, but may not always  have been located. Such creation errors do not reflect on  the standard of medical care.

## 2024-01-11 ENCOUNTER — Telehealth: Payer: Self-pay | Admitting: Family Medicine

## 2024-01-11 NOTE — Telephone Encounter (Signed)
Copied from CRM [id number]. Topic: Medicare AWV >> Jan 11, 2024  1:49 PM Payton Doughty wrote: Reason for CRM: Called LVM 01/11/2024 to schedule AWV. Please schedule office or virtual visits.  Verlee Rossetti; Care Guide Ambulatory Clinical Support [facility] Direct Dial: [phone]

## 2024-01-24 ENCOUNTER — Encounter: Payer: Self-pay | Admitting: Internal Medicine

## 2024-01-24 ENCOUNTER — Ambulatory Visit: Payer: Medicare PPO | Attending: Internal Medicine | Admitting: Internal Medicine

## 2024-01-24 VITALS — BP 115/73 | HR 63 | Resp 12 | Ht 66.5 in | Wt 173.0 lb

## 2024-01-24 DIAGNOSIS — M810 Age-related osteoporosis without current pathological fracture: Secondary | ICD-10-CM

## 2024-01-24 DIAGNOSIS — E559 Vitamin D deficiency, unspecified: Secondary | ICD-10-CM | POA: Diagnosis not present

## 2024-01-24 DIAGNOSIS — M6530 Trigger finger, unspecified finger: Secondary | ICD-10-CM | POA: Diagnosis not present

## 2024-01-24 DIAGNOSIS — M1712 Unilateral primary osteoarthritis, left knee: Secondary | ICD-10-CM | POA: Diagnosis not present

## 2024-01-24 DIAGNOSIS — Z79899 Other long term (current) drug therapy: Secondary | ICD-10-CM | POA: Diagnosis not present

## 2024-01-24 NOTE — Patient Instructions (Signed)
 Get other important nutrients Other nutrients that are important for bone health include: Phosphorus. This mineral is found in meat, poultry, dairy foods, nuts, and legumes. The recommended daily intake for adult men and adult women is 700 mg. Magnesium. This mineral is found in seeds, nuts, dark green vegetables, and legumes. The recommended daily intake for adult men is 400-420 mg. For adult women, it is 310-320 mg. Vitamin K. This vitamin is found in green leafy vegetables. The recommended daily intake is 120 mcg for adult men and 90 mcg for adult women. What type of physical activity is best for building and maintaining healthy bones? Weight-bearing and strength-building activities are important for building and maintaining healthy bones. Weight-bearing activities cause muscles and bones to work against gravity. Strength-building activities increase the strength of the muscles that support bones. Weight-bearing and muscle-building activities include: Walking and hiking. Jogging and running. Dancing. Gym exercises. Lifting weights. Climbing stairs. Aerobics. Adults should get at least 30 minutes of moderate physical activity on most days.  Exercise is important for bone health because it strengthens bones and muscles. Bones are living organs that get stronger when you exercise them, just like your heart and other muscles. Muscles support your bones and protect your joints. Strong muscles also help prevent bone loss and falls. Weight-bearing exercises and resistance exercises are the two types of exercise that are most important for bone health. Weight-bearing exercises include running or walking, climbing stairs, and playing sports like tennis. Resistance exercises include those done using free weights, weight machines, or resistance bands. Try to get at least 30 minutes of exercise every day. You can also include stretching, balance, and flexibility exercises, like yoga or tai chi. These lower  your risk of falls.

## 2024-01-25 LAB — COMPLETE METABOLIC PANEL WITH GFR
AG Ratio: 1.8 (calc) (ref 1.0–2.5)
ALT: 22 U/L (ref 6–29)
AST: 25 U/L (ref 10–35)
Albumin: 4 g/dL (ref 3.6–5.1)
Alkaline phosphatase (APISO): 63 U/L (ref 37–153)
BUN: 12 mg/dL (ref 7–25)
CO2: 30 mmol/L (ref 20–32)
Calcium: 9.6 mg/dL (ref 8.6–10.4)
Chloride: 103 mmol/L (ref 98–110)
Creat: 0.75 mg/dL (ref 0.60–0.95)
Globulin: 2.2 g/dL (ref 1.9–3.7)
Glucose, Bld: 73 mg/dL (ref 65–99)
Potassium: 5.2 mmol/L (ref 3.5–5.3)
Sodium: 140 mmol/L (ref 135–146)
Total Bilirubin: 0.5 mg/dL (ref 0.2–1.2)
Total Protein: 6.2 g/dL (ref 6.1–8.1)
eGFR: 80 mL/min/{1.73_m2} (ref >=60)

## 2024-01-25 LAB — VITAMIN D 25 HYDROXY (VIT D DEFICIENCY, FRACTURES): Vit D, 25-Hydroxy: 39 ng/mL (ref 30–100)

## 2024-02-08 ENCOUNTER — Other Ambulatory Visit: Payer: Self-pay | Admitting: Pharmacy Technician

## 2024-02-08 ENCOUNTER — Other Ambulatory Visit: Payer: Self-pay

## 2024-02-08 ENCOUNTER — Telehealth: Payer: Self-pay | Admitting: Pharmacist

## 2024-02-08 ENCOUNTER — Other Ambulatory Visit (HOSPITAL_COMMUNITY): Payer: Self-pay

## 2024-02-08 DIAGNOSIS — M810 Age-related osteoporosis without current pathological fracture: Secondary | ICD-10-CM

## 2024-02-08 DIAGNOSIS — Z79899 Other long term (current) drug therapy: Secondary | ICD-10-CM

## 2024-02-08 MED ORDER — DENOSUMAB 60 MG/ML ~~LOC~~ SOSY
60.0000 mg | PREFILLED_SYRINGE | SUBCUTANEOUS | 1 refills | Status: DC
  Filled 2024-02-08: qty 1, 180d supply, fill #0

## 2024-02-08 MED ORDER — DENOSUMAB 60 MG/ML ~~LOC~~ SOSY
60.0000 mg | PREFILLED_SYRINGE | Freq: Once | SUBCUTANEOUS | Status: AC
  Administered 2024-02-15: 60 mg via SUBCUTANEOUS

## 2024-02-08 NOTE — Progress Notes (Signed)
 Specialty Pharmacy Refill Coordination Note  Stacy Ayers is a 83 y.o. female assessed today regarding refills of clinic administered specialty medication(s) Denosumab (PROLIA)   Clinic requested Courier to Provider Office   Delivery date: 02/09/24   Verified address: [facility] [address]   Medication will be filled on 02/08/24.

## 2024-02-08 NOTE — Telephone Encounter (Signed)
 Patient due for Prolia on 01/22/2024. Copay is $64.  Rx sent to [facility] to be couriered to clinic.  ATC patient to schedule appt. Unable to reach. Left VM (earliest appt would be able to be scheduled is 02/15/24  Chesley Mires, PharmD, MPH, BCPS, CPP Clinical Pharmacist (Rheumatology and Pulmonology)

## 2024-02-09 NOTE — Telephone Encounter (Signed)
 Medication received from [facility]. Placed in fridge.  ATC patient to schedule Prolia appt. Unable to reach. Left VM to return call and schedule appt at any time - please schedule pt if she returns call  Chesley Mires, PharmD, MPH, BCPS, CPP Clinical Pharmacist (Rheumatology and Pulmonology)

## 2024-02-13 NOTE — Telephone Encounter (Signed)
 Patient scheduled for Prolia on 02/15/2024

## 2024-02-15 ENCOUNTER — Ambulatory Visit: Attending: Internal Medicine | Admitting: Pharmacist

## 2024-02-15 DIAGNOSIS — M810 Age-related osteoporosis without current pathological fracture: Secondary | ICD-10-CM

## 2024-02-15 DIAGNOSIS — Z7689 Persons encountering health services in other specified circumstances: Secondary | ICD-10-CM

## 2024-02-15 MED ORDER — DENOSUMAB 60 MG/ML ~~LOC~~ SOSY
60.0000 mg | PREFILLED_SYRINGE | SUBCUTANEOUS | Status: AC
  Administered 2024-08-14: 60 mg via SUBCUTANEOUS

## 2024-02-15 NOTE — Progress Notes (Signed)
 Pharmacy Note  Subjective:   Patient presents to clinic today to receive bi-annual dose of Prolia. Patient's last dose of Prolia was on 07/26/2023.  She had dental extraction completed earlier this month and denies any other upcoming procedures/surgeries.  Patient running a fever or have signs/symptoms of infection? No  Patient currently on antibiotics for the treatment of infection? No  Patient had fall in the last 6 months?  No   Patient taking calcium 1200 mg daily through diet or supplement and at least 800 units vitamin D? Yes  Objective: CMP     Component Value Date/Time   NA 140 01/24/2024 1052   NA 142 11/02/2021 0000   K 5.2 01/24/2024 1052   CL 103 01/24/2024 1052   CO2 30 01/24/2024 1052   GLUCOSE 73 01/24/2024 1052   BUN 12 01/24/2024 1052   BUN 8 11/02/2021 0000   CREATININE 0.75 01/24/2024 1052   CALCIUM 9.6 01/24/2024 1052   PROT 6.2 01/24/2024 1052   PROT 7.2 04/18/2017 1517   ALBUMIN 4.1 08/09/2023 1027   ALBUMIN 4.5 04/18/2017 1517   AST 25 01/24/2024 1052   ALT 22 01/24/2024 1052   ALKPHOS 62 08/09/2023 1027   BILITOT 0.5 01/24/2024 1052   BILITOT 0.2 04/18/2017 1517   GFRNONAA >60 10/31/2021 1006   GFRAA >60 07/09/2020 0859    CBC    Component Value Date/Time   WBC 5.2 08/09/2023 1027   RBC 3.47 (L) 08/09/2023 1027   HGB 11.6 (L) 08/09/2023 1027   HGB 12.6 05/17/2006 1039   HCT 35.1 (L) 08/09/2023 1027   HCT 36.5 05/17/2006 1039   PLT 200.0 08/09/2023 1027   PLT 155 05/17/2006 1039   MCV 101.1 (H) 08/09/2023 1027   MCV 90.5 05/17/2006 1039   MCH 33.8 (H) 07/21/2023 1152   MCHC 33.2 08/09/2023 1027   RDW 13.9 08/09/2023 1027   RDW 14.7 (H) 05/17/2006 1039   LYMPHSABS 1,646 07/21/2023 1152   LYMPHSABS 1.1 05/17/2006 1039   MONOABS 0.4 01/14/2023 1040   MONOABS 0.3 05/17/2006 1039   EOSABS 137 07/21/2023 1152   EOSABS 0.1 05/17/2006 1039   BASOSABS 59 07/21/2023 1152   BASOSABS 0.0 05/17/2006 1039    Lab Results  Component Value  Date   VD25OH 39 01/24/2024   T-score: 08/09/2023 - forearm radius T score -3.6  Assessment/Plan:   Reviewed importance of adequate dietary intake of calcium in addition to supplementation due to risk of hypocalcemia with Prolia.   Patient tolerated injection  well.   Administration details as below: Administrations This Visit     denosumab (PROLIA) injection 60 mg     Admin Date 02/15/2024 Action Given Dose 60 mg Route Subcutaneous Documented By Murrell Redden, RPH-CPP           Patient's next Prolia dose is due on 08/13/2024.  Patient is due for updated DEXA in September 2026.   All questions encouraged and answered.  Instructed patient to call with any further questions or concerns.   Chesley Mires, PharmD, MPH, BCPS, CPP Clinical Pharmacist (Rheumatology and Pulmonology)

## 2024-02-22 DIAGNOSIS — M65331 Trigger finger, right middle finger: Secondary | ICD-10-CM | POA: Diagnosis not present

## 2024-02-22 DIAGNOSIS — M65341 Trigger finger, right ring finger: Secondary | ICD-10-CM | POA: Diagnosis not present

## 2024-03-05 ENCOUNTER — Other Ambulatory Visit: Payer: Self-pay | Admitting: Cardiology

## 2024-03-05 ENCOUNTER — Other Ambulatory Visit: Payer: Self-pay | Admitting: Family Medicine

## 2024-03-05 DIAGNOSIS — K219 Gastro-esophageal reflux disease without esophagitis: Secondary | ICD-10-CM

## 2024-03-06 DIAGNOSIS — M65331 Trigger finger, right middle finger: Secondary | ICD-10-CM | POA: Diagnosis not present

## 2024-03-26 ENCOUNTER — Ambulatory Visit: Admitting: Family Medicine

## 2024-03-26 VITALS — BP 122/80 | HR 74 | Temp 98.1°F | Resp 18 | Ht 66.0 in | Wt 172.0 lb

## 2024-03-26 DIAGNOSIS — H60391 Other infective otitis externa, right ear: Secondary | ICD-10-CM | POA: Diagnosis not present

## 2024-03-26 MED ORDER — OFLOXACIN 0.3 % OT SOLN: 10.0000 [drp] | Freq: Every day | OTIC | 0 refills | Status: AC

## 2024-03-26 NOTE — Progress Notes (Signed)
 [facility] [address], [address] [place], [place] [address] [phone]  Date:  03/26/2024   Name:  Stacy Ayers   DOB:  August 27, 1941   MRN:  [id number]  PCP:  Estill Hemming, DO    Chief Complaint: R ear pain (X 3 days especially when chewing. She has had a tooth pulled on that side recently. But the pain goes into her throat. )   History of Present Illness:  Stacy Ayers is a 83 y.o. very pleasant female patient who presents with the following:  Primary patient of my partner Dr. Crecencio Dodge seen today with concern of earache  I have not seen her myself previously History of osteoporosis, hypertension, overactive bladder, hypothyroidism She has noted right ear pain for about 3 days Her throat also feels a bit sore Heraring seems normal  No swimming yet this year No drainage  She does not tend to have ear issues as an adult She did have a right upper molar pulled about a month ago- it was not infected however No other sx like fever or cough except she is dealing with her seasonal allergies   Patient Active Problem List   Diagnosis Date Noted   Nonintractable episodic headache 01/14/2023   Memory loss 01/14/2023   Non-recurrent acute serous otitis media of both ears 12/06/2022   Vertigo 12/06/2022   Bronchitis 12/06/2022   Trigger finger of right hand 11/03/2022   Nut allergy 07/30/2022   Bee sting allergy 07/30/2022   Urinary tract infection without hematuria 03/26/2022   Hypocalcemia 01/25/2022   Pressure injury of skin 10/29/2021   Lactic acidosis 10/28/2021   Left knee pain 10/28/2021   Primary osteoarthritis of left knee 10/19/2021   Pre-op evaluation 10/12/2021   Depression, major, single episode, mild (HCC) 07/24/2021   Age-related osteoporosis without current pathological fracture 06/25/2021   Diabetes mellitus (HCC) 04/28/2021   DM (diabetes mellitus) type II, controlled, with peripheral vascular  disorder (HCC) 04/28/2021   Morbid obesity (HCC) 04/28/2021   Anemia 04/28/2021   Fatigue 03/23/2021   Right ear pain 11/06/2020   Osteoarthritis of right knee 10/01/2020   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   OA (osteoarthritis) of knee 05/12/2018   Dysuria 05/03/2018   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   Seasonal allergies 08/11/2015   Alcohol dependence (HCC) 08/11/2015   CTS (carpal tunnel syndrome) 08/11/2015   Allergic rhinitis 12/19/2013   B12 deficiency 05/22/2013   Left foot pain 09/06/2012   Depression with anxiety 04/05/2012   Abdominal pain, generalized 01/18/2011   History of colonic polyps 01/18/2011   SPONTANEOUS ECCHYMOSES 10/13/2010   DIARRHEA, CHRONIC 10/13/2010   PHARYNGITIS, CHRONIC 02/16/2010   Hypothyroidism 01/14/2010   History of bariatric surgery 02/25/2009   Vitamin D  deficiency 09/10/2008   VARICOSE VEINS, LOWER EXTREMITIES 09/10/2008   PULMONARY NODULE 08/14/2008   FATIGUE 08/14/2008   MUSCLE PAIN 06/14/2007   BLADDER PAIN 06/14/2007   COLONIC POLYPS 03/14/2007   History of diet-controlled diabetes 03/14/2007   Hyperlipidemia LDL goal <100 03/14/2007   ANEMIA-IRON DEFICIENCY 03/14/2007   Essential hypertension 03/14/2007   VOCAL CORD PARALYSIS 03/14/2007   EDEMA, LARYNX 03/14/2007   Asthma 03/14/2007   Esophageal reflux 03/14/2007   LIVER FUNCTION TESTS, ABNORMAL, HX OF 03/14/2007   TONSILLECTOMY AND ADENOIDECTOMY, HX OF 03/14/2007    Past Medical History:  Diagnosis Date   Abnormal liver function tests  Allergy    Anxiety    Arthritis    knees, lower back    Asthma    Cancer (HCC)    Cataract    left eye growing cataract    Depression    Diabetes mellitus    had gastric bypass DM resolved-    Diverticulosis    GERD (gastroesophageal reflux disease)    Headache    Previous migraines   Heart murmur    History of colon polyps    adenomatous   Hyperlipidemia    past hx- resloved after gastric  bypass    Hypertension    Hypothyroidism    IBS (irritable bowel syndrome)    Iron deficiency anemia    Osteoporosis     Past Surgical History:  Procedure Laterality Date   ABDOMINAL HYSTERECTOMY     ABDOMINAL HYSTERECTOMY     APPENDECTOMY     BLADDER REPAIR     BREAST BIOPSY Right    needle core biopsy, benign   CARPAL TUNNEL RELEASE Left    Hand   CATARACT EXTRACTION Left    CHOLECYSTECTOMY     COLONOSCOPY     CORONARY ANGIOPLASTY     GASTRIC BYPASS     POLYPECTOMY     REVERSE SHOULDER ARTHROPLASTY Right 07/15/2020   Procedure: REVERSE SHOULDER ARTHROPLASTY;  Surgeon: Ellard Gunning, MD;  Location: [facility];  Service: Orthopedics;  Laterality: Right;    TONSILLECTOMY AND ADENOIDECTOMY     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   Procedure: TOTAL KNEE ARTHROPLASTY;  Surgeon: Liliane Rei, MD;  Location: [facility];  Service: Orthopedics;  Laterality: Left;    Social History   Tobacco Use   Smoking status: Former    Current packs/day: 0.00    Average packs/day: 0.3 packs/day for 2.0 years (0.6 ttl pk-yrs)    Types: Cigarettes    Start date: 07/11/1960    Quit date: 07/11/1962    Years since quitting: 61.7    Passive exposure: Past   Smokeless tobacco: Never  Vaping Use   Vaping status: Never Used  Substance Use Topics   Alcohol use: Not Currently   Drug use: No    Family History  Problem Relation Age of Onset   Stroke Mother    Asthma Mother    Anuerysm Mother    Obesity Father    Diabetes Sister    Heart disease Maternal Grandmother    Angina Maternal Grandmother    Suicidality Maternal Grandfather    Cancer Paternal Grandmother    Alzheimer's disease Paternal Grandmother    Alzheimer's disease Paternal Aunt    Colon cancer Neg Hx    Colon polyps Neg Hx    Esophageal cancer Neg Hx    Rectal cancer Neg Hx    Stomach cancer Neg Hx    Pancreatic cancer Neg Hx    Liver disease Neg Hx     Allergies  Allergen Reactions   Sulfonamide Derivatives  Diarrhea   Cocaine     Other reaction(s): Hallucination Sinus irrigation   Montelukast Sodium     UNKNOWN   Penicillins     Local skin reaction.  Tolerated ancef  07/15/2020 Tolerated Cephalosporin Date: 10/20/21.     Pioglitazone     UNKNOWN    Rosiglitazone Maleate     UNKNOWN   Secobarbital Sodium     UNKNOWN   Cefdinir  Palpitations    Possible reaction    Codeine Rash   Doxycycline Nausea Only    REACTION: GI  UPSET    Medication list has been reviewed and updated.  Current Outpatient Medications on File Prior to Visit  Medication Sig Dispense Refill   acetaminophen  (TYLENOL ) 650 MG CR tablet Take 650 mg by mouth every 8 (eight) hours as needed for pain.     albuterol  (VENTOLIN  HFA) 108 (90 Base) MCG/ACT inhaler INHALE TWO PUFFS INTO THE LUNGS EVERY 6 HOURS AS NEEDED 8.5 g 3   CRANBERRY PO Take by mouth.     Cyanocobalamin  (VITAMIN B-12 PO) Take by mouth.     denosumab  (PROLIA ) 60 MG/ML SOSY injection Inject 60 mg into the skin every 6 (six) months. Courier to rheum: [address], [address], [place] [address]. Appt on 02/14/24 1 mL 1   diclofenac  Sodium (VOLTAREN ) 1 % GEL Apply 2 g topically 4 (four) times daily. Do not apply to open area, apply to knee pain area     Ferrous Sulfate (IRON PO) Take by mouth.     ferrous sulfate 325 (65 FE) MG tablet Take 325 mg by mouth daily with breakfast.     fluticasone  (FLONASE ) 50 MCG/ACT nasal spray USE 2 SPRAYS IN EACH NOSTRIL ONCE DAILY 16 g 5   Loperamide HCl (ANTI-DIARRHEAL PO) Take by mouth.     losartan  (COZAAR ) 50 MG tablet TAKE ONE TABLET BY MOUTH DAILY 90 tablet 1   metoprolol  succinate (TOPROL -XL) 25 MG 24 hr tablet Take 1 tablet (25 mg total) by mouth 2 (two) times daily after a meal. 180 tablet 1   metoprolol  tartrate (LOPRESSOR ) 25 MG tablet Take 1 tablet (25 mg total) by mouth 3 (three) times daily as needed (palpitations). 90 tablet 3   oxybutynin  (DITROPAN -XL) 10 MG 24 hr tablet Take 1 tablet (10 mg total) by  mouth at bedtime. 30 tablet 3   oxybutynin  (OXYTROL ) 3.9 MG/24HR Place 1 patch onto the skin every 3 (three) days. 8 patch 12   pantoprazole  (PROTONIX ) 40 MG tablet Take 1 tablet (40 mg total) by mouth daily. 30 tablet 0   SYNTHROID  125 MCG tablet Take 1 tablet (125 mcg total) by mouth daily before breakfast. 30 tablet 0   venlafaxine  XR (EFFEXOR -XR) 150 MG 24 hr capsule Take 1 capsule (150 mg total) by mouth daily with breakfast. 90 capsule 1   VITAMIN D  PO Take by mouth.     Current Facility-Administered Medications on File Prior to Visit  Medication Dose Route Frequency Provider Last Rate Last Admin   [START ON 08/13/2024] denosumab  (PROLIA ) injection 60 mg  60 mg Subcutaneous Q6 months         Review of Systems:  As per HPI- otherwise negative.   Physical Examination: Vitals:   03/26/24 1328  BP: 122/80  Pulse: 74  Resp: 18  Temp: 98.1 F (36.7 C)  SpO2: 97%   Vitals:   03/26/24 1328  Height: 5\' 6"  (1.676 m)   Body mass index is 27.92 kg/m. Ideal Body Weight: Weight in (lb) to have BMI = 25: 154.6  GEN: no acute distress.  Minimally overweight, looks well. HEENT: Atraumatic, Normocephalic.  Bilateral TM wnl, oropharynx normal.  PEERL,EOMI. there is minimal erythema and edema of the right internal ear canal on exam Ears and Nose: No external deformity. CV: RRR, No M/G/R. No JVD. No thrill. No extra heart sounds. PULM: CTA B, no wheezes, crackles, rhonchi. No retractions. No resp. distress. No accessory muscle use. EXTR: No c/c/e PSYCH: Normally interactive. Conversant.    Assessment and Plan: Acute infective otitis externa,  right - Plan: ofloxacin (FLOXIN) 0.3 % OTIC solution  Patient seen today with discomfort in her right ear for a few days, on exam she does appear to have possible otitis externa.  Will start her on Floxin otic drops.  I asked her to please let me know if not seeing improvement in the next couple of days, if symptoms persist we may need to add an  oral antibiotic  Signed Gates Kasal, MD

## 2024-03-26 NOTE — Patient Instructions (Addendum)
 It was nice to meet you today!  Try the ear drops in the right ear- about 10 drops once daily Let me know if not improving over the next few days- please send me a mychart as needed Allergies may be contributing as well- your OTC antihistamine and nasal steroid spray may be helpful

## 2024-03-29 ENCOUNTER — Telehealth: Payer: Self-pay | Admitting: Family Medicine

## 2024-03-29 NOTE — Telephone Encounter (Signed)
 Copied from CRM [id number]. Topic: Medicare AWV >> Mar 29, 2024  3:23 PM Juliana Ocean wrote: Reason for CRM: LVM 03/29/2024 to schedule AWV. Please schedule Virtual or Telehealth visits ONLY.   Rosalee Collins; Care Guide Ambulatory Clinical Support [place] [facility] Direct Dial: [phone]

## 2024-04-02 ENCOUNTER — Other Ambulatory Visit: Payer: Self-pay | Admitting: Family Medicine

## 2024-04-02 DIAGNOSIS — I10 Essential (primary) hypertension: Secondary | ICD-10-CM

## 2024-04-02 DIAGNOSIS — K219 Gastro-esophageal reflux disease without esophagitis: Secondary | ICD-10-CM

## 2024-04-24 ENCOUNTER — Other Ambulatory Visit: Payer: Self-pay | Admitting: Family Medicine

## 2024-05-15 ENCOUNTER — Telehealth: Payer: Self-pay | Admitting: Family Medicine

## 2024-05-15 NOTE — Telephone Encounter (Signed)
 Copied from CRM [id number]. Topic: Medicare AWV >> May 15, 2024  3:02 PM Juliana Ocean wrote: Reason for CRM: LVM 05/15/2024 to schedule AWV. Please schedule Virtual or Telehealth visits ONLY.   Rosalee Collins; Care Guide Ambulatory Clinical Support [place] [facility] Direct Dial: [phone]

## 2024-05-22 ENCOUNTER — Ambulatory Visit: Admitting: Family Medicine

## 2024-05-22 ENCOUNTER — Ambulatory Visit (HOSPITAL_BASED_OUTPATIENT_CLINIC_OR_DEPARTMENT_OTHER)
Admission: RE | Admit: 2024-05-22 | Discharge: 2024-05-22 | Disposition: A | Discharge status: Home / Self Care | Source: Ambulatory Visit | Attending: Family Medicine | Admitting: Family Medicine

## 2024-05-22 ENCOUNTER — Encounter: Payer: Self-pay | Admitting: Family Medicine

## 2024-05-22 ENCOUNTER — Ambulatory Visit: Payer: Self-pay | Admitting: Family Medicine

## 2024-05-22 VITALS — BP 132/60 | HR 68 | Temp 98.9°F | Resp 18 | Ht 66.0 in | Wt 172.2 lb

## 2024-05-22 DIAGNOSIS — S0990XA Unspecified injury of head, initial encounter: Secondary | ICD-10-CM | POA: Diagnosis not present

## 2024-05-22 DIAGNOSIS — R519 Headache, unspecified: Secondary | ICD-10-CM | POA: Diagnosis not present

## 2024-05-22 DIAGNOSIS — E890 Postprocedural hypothyroidism: Secondary | ICD-10-CM

## 2024-05-22 DIAGNOSIS — I10 Essential (primary) hypertension: Secondary | ICD-10-CM | POA: Diagnosis not present

## 2024-05-22 DIAGNOSIS — N393 Stress incontinence (female) (male): Secondary | ICD-10-CM

## 2024-05-22 DIAGNOSIS — E1151 Type 2 diabetes mellitus with diabetic peripheral angiopathy without gangrene: Secondary | ICD-10-CM

## 2024-05-22 MED ORDER — MIRABEGRON ER 50 MG PO TB24: 50.0000 mg | ORAL_TABLET | Freq: Every day | ORAL | 5 refills | Status: DC

## 2024-05-22 NOTE — Progress Notes (Signed)
 Subjective:    Patient ID: Stacy Ayers, female    DOB: [birth year], 83 y.o.   MRN: [id number]  Chief Complaint  Patient presents with   Fall    Fall was last Thursday, pt states she tripped over a flower pot outside. Pt was seen by a nurse from [facility].    Follow-up    HPI Patient is in today for F/U FROM HER FALL.  Discussed the use of AI scribe software for clinical note transcription with the patient, who gave verbal consent to proceed.  History of Present Illness Stacy Ayers is an 83 year old female who presents with concerns of head injury after a fall.  Six days ago, she experienced a fall while weeding in the garden, tripping and hitting her forehead on a large pot before falling into a rose bush. Since then, she has developed noticeable swelling on her forehead, extending into her eye. She experiences minor headaches but denies any vision problems. No one witnessed the fall.  She has a history of urinary incontinence, describing it as 'constant leaky urine.' She has tried using the Oxytrol  patch but finds it irritating. Oral medications have caused excessive dryness and GERD symptoms, which concern her due to her singing activities. She recalls undergoing physical therapy for this issue in the past. She notes that when she leans in certain directions, she feels she has not completely emptied her bladder.  e  Past Medical History:  Diagnosis Date   Abnormal liver function tests    Allergy    Anxiety    Arthritis    knees, lower back    Asthma    Cancer (HCC)    Cataract    left eye growing cataract    Depression    Diabetes mellitus    had gastric bypass DM resolved-    Diverticulosis    GERD (gastroesophageal reflux disease)    Headache    Previous migraines   Heart murmur    History of colon polyps    adenomatous   Hyperlipidemia    past hx- resloved after gastric bypass    Hypertension    Hypothyroidism    IBS (irritable bowel syndrome)     Iron deficiency anemia    Osteoporosis     Past Surgical History:  Procedure Laterality Date   ABDOMINAL HYSTERECTOMY     ABDOMINAL HYSTERECTOMY     APPENDECTOMY     BLADDER REPAIR     BREAST BIOPSY Right    needle core biopsy, benign   CARPAL TUNNEL RELEASE Left    Hand   CATARACT EXTRACTION Left    CHOLECYSTECTOMY     COLONOSCOPY     CORONARY ANGIOPLASTY     GASTRIC BYPASS     POLYPECTOMY     REVERSE SHOULDER ARTHROPLASTY Right 07/15/2020   Procedure: REVERSE SHOULDER ARTHROPLASTY;  Surgeon: Melita Drivers, MD;  Location: [facility];  Service: Orthopedics;  Laterality: Right;    TONSILLECTOMY AND ADENOIDECTOMY     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   Procedure: TOTAL KNEE ARTHROPLASTY;  Surgeon: Melodi Lerner, MD;  Location: [facility];  Service: Orthopedics;  Laterality: Left;    Family History  Problem Relation Age of Onset   Stroke Mother    Asthma Mother    Anuerysm Mother    Obesity Father    Diabetes Sister    Heart disease Maternal Grandmother    Angina Maternal Grandmother    Suicidality Maternal Grandfather  Cancer Paternal Grandmother    Alzheimer's disease Paternal Grandmother    Alzheimer's disease Paternal Aunt    Colon cancer Neg Hx    Colon polyps Neg Hx    Esophageal cancer Neg Hx    Rectal cancer Neg Hx    Stomach cancer Neg Hx    Pancreatic cancer Neg Hx    Liver disease Neg Hx     Social History   Socioeconomic History   Marital status: Single    Spouse name: Not on file   Number of children: 1   Years of education: Not on file   Highest education level: Not on file  Occupational History   Occupation: retired Magazine features editor: RETIRED  Tobacco Use   Smoking status: Former    Current packs/day: 0.00    Average packs/day: 0.3 packs/day for 2.0 years (0.6 ttl pk-yrs)    Types: Cigarettes    Start date: 07/11/1960    Quit date: 07/11/1962    Years since quitting: 61.9    Passive exposure: Past   Smokeless tobacco: Never   Vaping Use   Vaping status: Never Used  Substance and Sexual Activity   Alcohol use: Not Currently   Drug use: No   Sexual activity: Yes    Birth control/protection: Surgical    Comment: Hysterectomy  Other Topics Concern   Not on file  Social History Narrative   Daily caffeine   Social Drivers of Health   Financial Resource Strain: Low Risk  (02/15/2022)   Overall Financial Resource Strain (CARDIA)    Difficulty of Paying Living Expenses: Not hard at all  Food Insecurity: No Food Insecurity (02/23/2023)   Hunger Vital Sign    Worried About Running Out of Food in the Last Year: Never true    Ran Out of Food in the Last Year: Never true  Transportation Needs: No Transportation Needs (02/23/2023)   PRAPARE - Administrator, Civil Service (Medical): No    Lack of Transportation (Non-Medical): No  Physical Activity: Inactive (02/15/2022)   Exercise Vital Sign    Days of Exercise per Week: 0 days    Minutes of Exercise per Session: 0 min  Stress: No Stress Concern Present (02/15/2022)   [employer] of Occupational Health - Occupational Stress Questionnaire    Feeling of Stress : Not at all  Social Connections: Unknown (04/13/2022)   Received from [facility]   Social Network    Social Network: Not on file  Intimate Partner Violence: Not At Risk (02/23/2023)   Humiliation, Afraid, Rape, and Kick questionnaire    Fear of Current or Ex-Partner: No    Emotionally Abused: No    Physically Abused: No    Sexually Abused: No    Outpatient Medications Prior to Visit  Medication Sig Dispense Refill   acetaminophen  (TYLENOL ) 650 MG CR tablet Take 650 mg by mouth every 8 (eight) hours as needed for pain.     albuterol  (VENTOLIN  HFA) 108 (90 Base) MCG/ACT inhaler INHALE TWO PUFFS INTO THE LUNGS EVERY 6 HOURS AS NEEDED 8.5 g 3   CRANBERRY PO Take by mouth.     Cyanocobalamin  (VITAMIN B-12 PO) Take by mouth.     denosumab  (PROLIA ) 60 MG/ML SOSY injection Inject 60 mg into  the skin every 6 (six) months. Courier to rheum: [address], [address], [place] [address]. Appt on 02/14/24 1 mL 1   Ferrous Sulfate (IRON PO) Take by mouth.  ferrous sulfate 325 (65 FE) MG tablet Take 325 mg by mouth daily with breakfast.     fluticasone  (FLONASE ) 50 MCG/ACT nasal spray USE 2 SPRAYS IN EACH NOSTRIL ONCE DAILY 16 g 5   Loperamide HCl (ANTI-DIARRHEAL PO) Take by mouth.     losartan  (COZAAR ) 50 MG tablet TAKE ONE TABLET BY MOUTH DAILY 90 tablet 1   metoprolol  succinate (TOPROL -XL) 25 MG 24 hr tablet Take 1 tablet (25 mg total) by mouth 2 (two) times daily after a meal. 180 tablet 1   metoprolol  tartrate (LOPRESSOR ) 25 MG tablet Take 1 tablet (25 mg total) by mouth 3 (three) times daily as needed (palpitations). 90 tablet 3   ofloxacin  (FLOXIN ) 0.3 % OTIC solution Place 10 drops into the right ear daily. Use for 7 days 5 mL 0   pantoprazole  (PROTONIX ) 40 MG tablet TAKE ONE TABLET BY MOUTH DAILY **NEED OFFICE VISIT** 30 tablet 1   SYNTHROID  125 MCG tablet TAKE ONE TABLET BY MOUTH DAILY BEFORE BREAKFAST **NEED OFFICE VISIT** 30 tablet 1   venlafaxine  XR (EFFEXOR -XR) 150 MG 24 hr capsule Take 1 capsule (150 mg total) by mouth daily with breakfast. 30 capsule 0   VITAMIN D  PO Take by mouth.     diclofenac  Sodium (VOLTAREN ) 1 % GEL Apply 2 g topically 4 (four) times daily. Do not apply to open area, apply to knee pain area     oxybutynin  (DITROPAN -XL) 10 MG 24 hr tablet Take 1 tablet (10 mg total) by mouth at bedtime. 30 tablet 3   oxybutynin  (OXYTROL ) 3.9 MG/24HR Place 1 patch onto the skin every 3 (three) days. 8 patch 12   Facility-Administered Medications Prior to Visit  Medication Dose Route Frequency Provider Last Rate Last Admin   [START ON 08/13/2024] denosumab  (PROLIA ) injection 60 mg  60 mg Subcutaneous Q6 months         Allergies  Allergen Reactions   Sulfonamide Derivatives Diarrhea   Cocaine     Other reaction(s): Hallucination Sinus irrigation   Montelukast  Sodium     UNKNOWN   Penicillins     Local skin reaction.  Tolerated ancef  07/15/2020 Tolerated Cephalosporin Date: 10/20/21.     Pioglitazone     UNKNOWN    Rosiglitazone Maleate     UNKNOWN   Secobarbital Sodium     UNKNOWN   Cefdinir  Palpitations    Possible reaction    Codeine Rash   Doxycycline Nausea Only    REACTION: GI UPSET    Review of Systems  Constitutional:  Negative for fever and malaise/fatigue.  HENT:  Negative for congestion.   Eyes:  Negative for blurred vision.  Respiratory:  Negative for cough and shortness of breath.   Cardiovascular:  Negative for chest pain, palpitations and leg swelling.  Gastrointestinal:  Negative for abdominal pain, blood in stool, nausea and vomiting.  Genitourinary:  Negative for dysuria and frequency.  Musculoskeletal:  Negative for back pain and falls.  Skin:  Negative for rash.  Neurological:  Negative for dizziness, loss of consciousness and headaches.  Endo/Heme/Allergies:  Negative for environmental allergies.       Bruising across bridge of nose and lower legs healing  Hematoma mid forehead   Psychiatric/Behavioral:  Negative for depression. The patient is not nervous/anxious.        Objective:    Physical Exam Vitals and nursing note reviewed.  Constitutional:      General: She is not in acute distress.    Appearance: Normal appearance.  She is well-developed.  HENT:     Head: Normocephalic and atraumatic.   Eyes:     General: No scleral icterus.       Right eye: No discharge.        Left eye: No discharge.    Cardiovascular:     Rate and Rhythm: Normal rate and regular rhythm.     Heart sounds: No murmur heard. Pulmonary:     Effort: Pulmonary effort is normal. No respiratory distress.     Breath sounds: Normal breath sounds.   Musculoskeletal:        General: Normal range of motion.     Cervical back: Normal range of motion and neck supple.     Right lower leg: No edema.     Left lower leg: No  edema.   Skin:    General: Skin is warm and dry.     Findings: Bruising present.     Comments: Bruising across bridge nose and hematoma mid of forehead    Neurological:     Mental Status: She is alert and oriented to person, place, and time.   Psychiatric:        Mood and Affect: Mood normal.        Behavior: Behavior normal.        Thought Content: Thought content normal.        Judgment: Judgment normal.     BP 132/60 (BP Location: Right Arm, Patient Position: Sitting, Cuff Size: Normal)   Pulse 68   Temp 98.9 F (37.2 C) (Oral)   Resp 18   Ht 5' 6 (1.676 m)   Wt 172 lb 3.2 oz (78.1 kg)   SpO2 99%   BMI 27.79 kg/m  Wt Readings from Last 3 Encounters:  05/22/24 172 lb 3.2 oz (78.1 kg)  03/26/24 172 lb (78 kg)  01/24/24 173 lb (78.5 kg)    Diabetic Foot Exam - Simple   No data filed    Lab Results  Component Value Date   WBC 5.2 08/09/2023   HGB 11.6 (L) 08/09/2023   HCT 35.1 (L) 08/09/2023   PLT 200.0 08/09/2023   GLUCOSE 73 01/24/2024   CHOL 166 01/14/2023   TRIG 131.0 01/14/2023   HDL 42.10 01/14/2023   LDLDIRECT 147.0 05/28/2019   LDLCALC 98 01/14/2023   ALT 22 01/24/2024   AST 25 01/24/2024   NA 140 01/24/2024   K 5.2 01/24/2024   CL 103 01/24/2024   CREATININE 0.75 01/24/2024   BUN 12 01/24/2024   CO2 30 01/24/2024   TSH 2.65 08/09/2023   INR 0.9 07/30/2022   HGBA1C 5.8 11/02/2022   MICROALBUR <0.7 11/02/2022    Lab Results  Component Value Date   TSH 2.65 08/09/2023   Lab Results  Component Value Date   WBC 5.2 08/09/2023   HGB 11.6 (L) 08/09/2023   HCT 35.1 (L) 08/09/2023   MCV 101.1 (H) 08/09/2023   PLT 200.0 08/09/2023   Lab Results  Component Value Date   NA 140 01/24/2024   K 5.2 01/24/2024   CO2 30 01/24/2024   GLUCOSE 73 01/24/2024   BUN 12 01/24/2024   CREATININE 0.75 01/24/2024   BILITOT 0.5 01/24/2024   ALKPHOS 62 08/09/2023   AST 25 01/24/2024   ALT 22 01/24/2024   PROT 6.2 01/24/2024   ALBUMIN 4.1 08/09/2023    CALCIUM 9.6 01/24/2024   ANIONGAP 10 10/31/2021   EGFR 80 01/24/2024   GFR 83.59 08/09/2023   Lab Results  Component Value Date   CHOL 166 01/14/2023   Lab Results  Component Value Date   HDL 42.10 01/14/2023   Lab Results  Component Value Date   LDLCALC 98 01/14/2023   Lab Results  Component Value Date   TRIG 131.0 01/14/2023   Lab Results  Component Value Date   CHOLHDL 4 01/14/2023   Lab Results  Component Value Date   HGBA1C 5.8 11/02/2022       Assessment & Plan:  Traumatic injury of head, initial encounter -     CT HEAD WO CONTRAST ( ); Future  Stress incontinence of urine -     Mirabegron ER; Take 1 tablet (50 mg total) by mouth daily.  Dispense: 30 tablet; Refill: 5  Essential hypertension -     CBC with Differential/Platelet -     Comprehensive metabolic panel with GFR -     Lipid panel  DM (diabetes mellitus) type II, controlled, with peripheral vascular disorder (HCC) -     Comprehensive metabolic panel with GFR -     Hemoglobin A1c  Postablative hypothyroidism -     TSH  Assessment and Plan Assessment & Plan Head Trauma   Sustained a fall six days ago, resulting in a forehead contusion with bruising extending into the eye. She reports minor headaches but no visual disturbances. Family history of intracranial hemorrhage post-head trauma raises concern for potential brain bleed. Discussed the case of Mylinda Bunker as an example of delayed complications from head trauma. Order a CT scan of the head to rule out intracranial hemorrhage.  Urinary Incontinence   Chronic urinary incontinence with irritation from oxytrol  patch and adverse effects from previous oral medication, including exacerbation of GERD symptoms. Reports incomplete bladder emptying with positional changes. Previous physical therapy for incontinence was done. Discussed Myrbetriq as an alternative with potentially fewer side effects. Prescribe Myrbetriq 50 mg. If ineffective or  not covered by insurance, consider Gemtesa. Refer to a urologist for further evaluation and management.  Gastroesophageal Reflux Disease (GERD)   GERD symptoms exacerbated by previous medication for urinary incontinence. Concern about impact on voice due to ongoing singing activities.  Follow-up   Coordinate with referral person for CT scan and urology referral. Follow-up plans discussed for ongoing management of conditions.  fe3  Ezmeralda Stefanick R Lowne Chase, DO

## 2024-05-23 LAB — COMPREHENSIVE METABOLIC PANEL WITH GFR
ALT: 14 U/L (ref 0–35)
AST: 17 U/L (ref 0–37)
Albumin: 4.1 g/dL (ref 3.5–5.2)
Alkaline Phosphatase: 45 U/L (ref 39–117)
BUN: 10 mg/dL (ref 6–23)
CO2: 28 meq/L (ref 19–32)
Calcium: 8.9 mg/dL (ref 8.4–10.5)
Chloride: 103 meq/L (ref 96–112)
Creatinine, Ser: 0.62 mg/dL (ref 0.40–1.20)
GFR: 83.77 mL/min (ref >60.00)
Glucose, Bld: 141 mg/dL — ABNORMAL HIGH (ref 70–99)
Potassium: 4 meq/L (ref 3.5–5.1)
Sodium: 140 meq/L (ref 135–145)
Total Bilirubin: 0.5 mg/dL (ref 0.2–1.2)
Total Protein: 6.1 g/dL (ref 6.0–8.3)

## 2024-05-23 LAB — CBC WITH DIFFERENTIAL/PLATELET
Basophils Absolute: 0.1 10*3/uL (ref 0.0–0.1)
Basophils Relative: 2.5 % (ref 0.0–3.0)
Eosinophils Absolute: 0.1 10*3/uL (ref 0.0–0.7)
Eosinophils Relative: 2 % (ref 0.0–5.0)
HCT: 33.9 % — ABNORMAL LOW (ref 36.0–46.0)
Hemoglobin: 11.4 g/dL — ABNORMAL LOW (ref 12.0–15.0)
Lymphocytes Relative: 26.3 % (ref 12.0–46.0)
Lymphs Abs: 1.3 10*3/uL (ref 0.7–4.0)
MCHC: 33.5 g/dL (ref 30.0–36.0)
MCV: 98.3 fl (ref 78.0–100.0)
Monocytes Absolute: 0.2 10*3/uL (ref 0.1–1.0)
Monocytes Relative: 4.8 % (ref 3.0–12.0)
Neutro Abs: 3.2 10*3/uL (ref 1.4–7.7)
Neutrophils Relative %: 64.4 % (ref 43.0–77.0)
Platelets: 195 10*3/uL (ref 150.0–400.0)
RBC: 3.45 Mil/uL — ABNORMAL LOW (ref 3.87–5.11)
RDW: 14.1 % (ref 11.5–15.5)
WBC: 5 10*3/uL (ref 4.0–10.5)

## 2024-05-23 LAB — LIPID PANEL
Cholesterol: 177 mg/dL (ref 0–200)
HDL: 45.3 mg/dL (ref >39.00)
LDL Cholesterol: 103 mg/dL — ABNORMAL HIGH (ref 0–99)
NonHDL: 131.78
Total CHOL/HDL Ratio: 4
Triglycerides: 144 mg/dL (ref 0.0–149.0)
VLDL: 28.8 mg/dL (ref 0.0–40.0)

## 2024-05-23 LAB — TSH: TSH: 0.39 u[IU]/mL (ref 0.35–5.50)

## 2024-05-23 LAB — HEMOGLOBIN A1C: Hgb A1c MFr Bld: 5.7 % (ref 4.6–6.5)

## 2024-05-31 ENCOUNTER — Other Ambulatory Visit: Payer: Self-pay | Admitting: Family Medicine

## 2024-06-15 ENCOUNTER — Other Ambulatory Visit: Payer: Self-pay | Admitting: Family Medicine

## 2024-06-15 DIAGNOSIS — K219 Gastro-esophageal reflux disease without esophagitis: Secondary | ICD-10-CM

## 2024-07-23 DIAGNOSIS — L821 Other seborrheic keratosis: Secondary | ICD-10-CM | POA: Diagnosis not present

## 2024-07-23 DIAGNOSIS — L570 Actinic keratosis: Secondary | ICD-10-CM | POA: Diagnosis not present

## 2024-07-23 DIAGNOSIS — L720 Epidermal cyst: Secondary | ICD-10-CM | POA: Diagnosis not present

## 2024-07-23 DIAGNOSIS — Z85828 Personal history of other malignant neoplasm of skin: Secondary | ICD-10-CM | POA: Diagnosis not present

## 2024-07-31 ENCOUNTER — Other Ambulatory Visit: Payer: Self-pay | Admitting: Family Medicine

## 2024-07-31 DIAGNOSIS — U071 COVID-19: Secondary | ICD-10-CM

## 2024-08-01 ENCOUNTER — Other Ambulatory Visit (HOSPITAL_COMMUNITY): Payer: Self-pay

## 2024-08-01 ENCOUNTER — Telehealth: Payer: Self-pay

## 2024-08-01 DIAGNOSIS — M810 Age-related osteoporosis without current pathological fracture: Secondary | ICD-10-CM

## 2024-08-01 DIAGNOSIS — Z79899 Other long term (current) drug therapy: Secondary | ICD-10-CM

## 2024-08-01 NOTE — Telephone Encounter (Signed)
 Patient due for Prolia  on 08/13/24. Patient will need to update CMP per test claim, copay through pharmacy benefit is $64. Left voicemail for patient to call back.

## 2024-08-02 NOTE — Telephone Encounter (Signed)
 ATC patient in regards to Prolia  dose due. Left VM for patient to call pharmacy team back.

## 2024-08-03 ENCOUNTER — Other Ambulatory Visit: Payer: Self-pay | Admitting: Pharmacy Technician

## 2024-08-03 ENCOUNTER — Other Ambulatory Visit: Payer: Self-pay

## 2024-08-03 ENCOUNTER — Other Ambulatory Visit: Payer: Self-pay | Admitting: *Deleted

## 2024-08-03 DIAGNOSIS — Z79899 Other long term (current) drug therapy: Secondary | ICD-10-CM

## 2024-08-03 DIAGNOSIS — M810 Age-related osteoporosis without current pathological fracture: Secondary | ICD-10-CM

## 2024-08-03 LAB — COMPREHENSIVE METABOLIC PANEL WITH GFR
AG Ratio: 2.3 (calc) (ref 1.0–2.5)
ALT: 20 U/L (ref 6–29)
AST: 25 U/L (ref 10–35)
Albumin: 4.3 g/dL (ref 3.6–5.1)
Alkaline phosphatase (APISO): 51 U/L (ref 37–153)
BUN/Creatinine Ratio: 19 (calc) (ref 6–22)
BUN: 10 mg/dL (ref 7–25)
CO2: 31 mmol/L (ref 20–32)
Calcium: 9.2 mg/dL (ref 8.6–10.4)
Chloride: 103 mmol/L (ref 98–110)
Creat: 0.54 mg/dL — ABNORMAL LOW (ref 0.60–0.95)
Globulin: 1.9 g/dL (ref 1.9–3.7)
Glucose, Bld: 92 mg/dL (ref 65–99)
Potassium: 5.2 mmol/L (ref 3.5–5.3)
Sodium: 139 mmol/L (ref 135–146)
Total Bilirubin: 0.7 mg/dL (ref 0.2–1.2)
Total Protein: 6.2 g/dL (ref 6.1–8.1)
eGFR: 92 mL/min/1.73m2 (ref >=60)

## 2024-08-03 MED ORDER — DENOSUMAB 60 MG/ML ~~LOC~~ SOSY
60.0000 mg | PREFILLED_SYRINGE | SUBCUTANEOUS | 0 refills | Status: AC
  Filled 2024-08-03: qty 1, 180d supply, fill #0

## 2024-08-03 NOTE — Progress Notes (Signed)
 Specialty Pharmacy Refill Coordination Note  Stacy Ayers is a 83 y.o. female assessed today regarding refills of clinic administered specialty medication(s) Denosumab  (PROLIA )   Clinic requested Courier to Provider Office   Delivery date: 08/08/24   Verified address: [facility] [address] [place], [place] [address]   Medication will be filled on 08/07/24. Appt on 9/16.  Per chart: 9.3 LVM pt co-pay $64 for callback

## 2024-08-03 NOTE — Telephone Encounter (Signed)
 Patient returned call. She will stop by today or next week for labs.  She is scheduled for Prolia  on 08/14/2024. Rx sent to [facility] to be couriered to clinic before appt. She denies any upcoming dental procedures/surgeries  Sherry Pennant, PharmD, MPH, BCPS, CPP Clinical Pharmacist (Rheumatology and Pulmonology)

## 2024-08-07 ENCOUNTER — Other Ambulatory Visit: Payer: Self-pay | Admitting: Family Medicine

## 2024-08-07 ENCOUNTER — Other Ambulatory Visit (HOSPITAL_COMMUNITY): Payer: Self-pay

## 2024-08-07 ENCOUNTER — Other Ambulatory Visit: Payer: Self-pay

## 2024-08-07 NOTE — Progress Notes (Signed)
 I attempted to reach the patient. Left a voicemail for patient to call back with updated payment information. Credit card on file declined.

## 2024-08-08 DIAGNOSIS — H524 Presbyopia: Secondary | ICD-10-CM | POA: Diagnosis not present

## 2024-08-08 DIAGNOSIS — H35372 Puckering of macula, left eye: Secondary | ICD-10-CM | POA: Diagnosis not present

## 2024-08-08 DIAGNOSIS — H43393 Other vitreous opacities, bilateral: Secondary | ICD-10-CM | POA: Diagnosis not present

## 2024-08-08 DIAGNOSIS — H35462 Secondary vitreoretinal degeneration, left eye: Secondary | ICD-10-CM | POA: Diagnosis not present

## 2024-08-10 NOTE — Progress Notes (Signed)
 Pharmacy Note  Subjective:   Patient presents to clinic today to receive bi-annual dose of Prolia . Patient's last dose of Prolia  was on 02/15/2024.  Patient running a fever or have signs/symptoms of infection? No  Patient currently on antibiotics for the treatment of infection? No  Patient had fall in the last 6 months?  Yes  If yes, did it require medical attention? Yes. She fell in her garden and hit the anterior part of head on a concrete planter. She developed profuse bruising on entire face but did not lose consciousness. She was able to get up herself and then reached out to nurse at [facility]. She did seek care after advisement from daughter - had head CT performed which was normal. There was no concern for fracture.  Patient taking calcium 1200 mg daily through diet or supplement and at least 800 units vitamin D ? Yes  Objective: CMP     Component Value Date/Time   NA 139 08/03/2024 1223   NA 142 11/02/2021 0000   K 5.2 08/03/2024 1223   CL 103 08/03/2024 1223   CO2 31 08/03/2024 1223   GLUCOSE 92 08/03/2024 1223   BUN 10 08/03/2024 1223   BUN 8 11/02/2021 0000   CREATININE 0.54 (L) 08/03/2024 1223   CALCIUM 9.2 08/03/2024 1223   PROT 6.2 08/03/2024 1223   PROT 7.2 04/18/2017 1517   ALBUMIN 4.1 05/22/2024 1331   ALBUMIN 4.5 04/18/2017 1517   AST 25 08/03/2024 1223   ALT 20 08/03/2024 1223   ALKPHOS 45 05/22/2024 1331   BILITOT 0.7 08/03/2024 1223   BILITOT 0.2 04/18/2017 1517   GFRNONAA >60 10/31/2021 1006   GFRAA >60 07/09/2020 0859    CBC    Component Value Date/Time   WBC 5.0 05/22/2024 1331   RBC 3.45 (L) 05/22/2024 1331   HGB 11.4 (L) 05/22/2024 1331   HGB 12.6 05/17/2006 1039   HCT 33.9 (L) 05/22/2024 1331   HCT 36.5 05/17/2006 1039   PLT 195.0 05/22/2024 1331   PLT 155 05/17/2006 1039   MCV 98.3 05/22/2024 1331   MCV 90.5 05/17/2006 1039   MCH 33.8 (H) 07/21/2023 1152   MCHC 33.5 05/22/2024 1331   RDW 14.1 05/22/2024 1331   RDW 14.7 (H) 05/17/2006  1039   LYMPHSABS 1.3 05/22/2024 1331   LYMPHSABS 1.1 05/17/2006 1039   MONOABS 0.2 05/22/2024 1331   MONOABS 0.3 05/17/2006 1039   EOSABS 0.1 05/22/2024 1331   EOSABS 0.1 05/17/2006 1039   BASOSABS 0.1 05/22/2024 1331   BASOSABS 0.0 05/17/2006 1039   Lab Results  Component Value Date   VD25OH 39 01/24/2024   T-score: 08/09/2023 - forearm radius T score -3.6  Assessment/Plan:   Reviewed importance of adequate dietary intake of calcium in addition to supplementation due to risk of hypocalcemia with Prolia .   Patient tolerated injection well.   Administration details as below: Administrations This Visit     denosumab  (PROLIA ) injection 60 mg     Admin Date 08/14/2024 Action Given Dose 60 mg Route Subcutaneous Documented By Dayne Sherry RAMAN, RPH-CPP           Patient's next Prolia  dose is due on 02/10/2025.  Patient is due for updated DEXA in September 2026. F/u with Dr. Jeannetta is scheduled for Feb 2026   All questions encouraged and answered.  Instructed patient to call with any further questions or concerns.   Sherry Dayne, PharmD, MPH, BCPS, CPP Clinical Pharmacist [facility])

## 2024-08-13 NOTE — Telephone Encounter (Signed)
 CMP on 08/03/2024 wnl. Ok to proceed with Prolia  on 08/14/24

## 2024-08-14 ENCOUNTER — Other Ambulatory Visit: Payer: Self-pay | Admitting: Family Medicine

## 2024-08-14 ENCOUNTER — Ambulatory Visit: Attending: Internal Medicine | Admitting: Pharmacist

## 2024-08-14 DIAGNOSIS — M810 Age-related osteoporosis without current pathological fracture: Secondary | ICD-10-CM

## 2024-08-14 DIAGNOSIS — K219 Gastro-esophageal reflux disease without esophagitis: Secondary | ICD-10-CM

## 2024-08-14 DIAGNOSIS — Z7689 Persons encountering health services in other specified circumstances: Secondary | ICD-10-CM

## 2024-08-14 DIAGNOSIS — Z79899 Other long term (current) drug therapy: Secondary | ICD-10-CM

## 2024-08-14 MED ORDER — DENOSUMAB 60 MG/ML ~~LOC~~ SOSY: 60.0000 mg | PREFILLED_SYRINGE | SUBCUTANEOUS | Status: AC

## 2024-09-12 ENCOUNTER — Other Ambulatory Visit: Payer: Self-pay | Admitting: Cardiology

## 2024-09-13 ENCOUNTER — Ambulatory Visit: Payer: Self-pay

## 2024-09-13 NOTE — Telephone Encounter (Signed)
 FYI Only or Action Required?: Action required by provider: Please call patient if any openings/cancellations with Dr Cruz 10/16 or 10/17.  Patient was last seen in primary care on 05/22/2024 by Antonio Meth, Jamee SAUNDERS, DO.  Called Nurse Triage reporting Medication Problem.  Symptoms began several days ago.  Interventions attempted: Rest, hydration, or home remedies.  Symptoms are: unchanged.  Triage Disposition: See PCP When Office is Open (Within 3 Days)  Patient/caregiver understands and will follow disposition?: Yes  Copied from CRM [id number]. Topic: Clinical - Red Word Triage >> Sep 13, 2024 10:38 AM Harlene ORN wrote: Red Word that prompted transfer to Nurse Triage: Patient is having medication side effects. Mixed up her morning and evening dosages, and tried to discontinue her mirabegron  ER (MYRBETRIQ ) 50 MG TB24 tablet on her own. Having dizziness, cannot think straight, and night sweats where she would wake up drenched in sweat. Reason for Disposition  [1] MILD dizziness (e.g., vertigo; walking normally) AND [2] has NOT been evaluated by doctor (or NP/PA) for this  Answer Assessment - Initial Assessment Questions Pt calling to report feeling off this week. On Monday she stopped her Myrbetriq  thinking it was causing some agitation. She has had night sweats since, waking up drenched.  She mixed up her morning and evening meds the other day as well. Unable to remember which day specifically. Both pill containers are the same color. Typical AM Meds- Losartan , Pantoprazole , synthroid , and Effexor   PM Meds- Cran iron Vit D, myrbetriq , antidiarrheal, metoprolol  She is trying to get herself back on schedule and didn't want to double dose her meds so skipped them last night  -Increased dizziness, Agitated, brain fog, night sweats (drenched 2 nights), feels like a motor is running constantly. She is very active in her community and it is increasing her stress bc she feels like she is  falling behind and doesn't want to be that person who messes up her meds.  This AM-126/92 w 98HR- normal for her. Dizziness is mild, spinning sensation feeling like there is extra energy in her body and making her feel restless. Denies vision changes, balance issue, or unilateral weakness.  CAL contacted with no PCP appt, will see another NP in office 10/17. ED/UC/Call back instructions given and understood.   1. DESCRIPTION: Describe your dizziness.     spinning 2. VERTIGO: Do you feel like either you or the room is spinning or tilting?      Room feels like it is spinning even with eyes closed 3. LIGHTHEADED: Do you feel lightheaded? (e.g., somewhat faint, woozy, weak upon standing)     Denies- feels like extra energy  4. SEVERITY: How bad is it?  Can you walk?     Mild- still able to walk and function  5. ONSET:  When did the dizziness begin?     Off and on- gotten worse in last few days  6. AGGRAVATING FACTORS: Does anything make it worse? (e.g., standing, change in head position)     Med switch up making it worse 7. CAUSE: What do you think is causing the dizziness?     Medication switch ups 8. RECURRENT SYMPTOM: Have you had dizziness before? If Yes, ask: When was the last time? What happened that time?     Yes- but last time was ear infection  9. OTHER SYMPTOMS: Do you have any other symptoms? (e.g., earache, headache, numbness, tinnitus, vomiting, weakness)     Brain fog, agitation, night sweats.  Answer Assessment - Initial Assessment  Questions Switched morning and evening meds, Agitated, can't think straight, night sweats, feels like a motor is running.  126/92 w 98HR Took herself off Myrbetriq  Tuesday night and Weds- felt inside agitation- felt like pulse was up but does happen sometime. Cran iron Vit D, myrbetriq , antidiarrheal, metoprolol - took during the day.  Losartan , Pantoprazole , synthroid , and Effexor    1. DESCRIPTION: Describe your  dizziness.     *No Answer* 2. LIGHTHEADED: Do you feel lightheaded? (e.g., somewhat faint, woozy, weak upon standing)     *No Answer* 3. VERTIGO: Do you feel like either you or the room is spinning or tilting? (i.e., vertigo)     *No Answer* 4. SEVERITY: How bad is it?  Do you feel like you are going to faint? Can you stand and walk?     *No Answer* 5. ONSET:  When did the dizziness begin?     *No Answer* 6. AGGRAVATING FACTORS: Does anything make it worse? (e.g., standing, change in head position)     *No Answer* 7. HEART RATE: Can you tell me your heart rate? How many beats in 15 seconds?  (Note: Not all patients can do this.)       *No Answer* 8. CAUSE: What do you think is causing the dizziness? (e.g., decreased fluids or food, diarrhea, emotional distress, heat exposure, new medicine, sudden standing, vomiting; unknown)     *No Answer* 9. RECURRENT SYMPTOM: Have you had dizziness before? If Yes, ask: When was the last time? What happened that time?     *No Answer* 10. OTHER SYMPTOMS: Do you have any other symptoms? (e.g., fever, chest pain, vomiting, diarrhea, bleeding)       *No Answer* 11. PREGNANCY: Is there any chance you are pregnant? When was your last menstrual period?       *No Answer*  Protocols used: Dizziness - Lightheadedness-A-AH, Dizziness - Vertigo-A-AH

## 2024-09-14 ENCOUNTER — Encounter: Payer: Self-pay | Admitting: Student

## 2024-09-14 ENCOUNTER — Ambulatory Visit: Admitting: Student

## 2024-09-14 VITALS — BP 121/72 | HR 82 | Temp 98.1°F | Ht 66.0 in | Wt 167.4 lb

## 2024-09-14 DIAGNOSIS — F418 Other specified anxiety disorders: Secondary | ICD-10-CM

## 2024-09-14 DIAGNOSIS — R32 Unspecified urinary incontinence: Secondary | ICD-10-CM | POA: Diagnosis not present

## 2024-09-14 NOTE — Progress Notes (Signed)
   Acute Office Visit  Subjective:     Patient ID: Stacy Ayers, female    DOB: October 07, 1941, 83 y.o.   MRN: [id number]  No chief complaint on file.   HPI  History of Present Illness Stacy Ayers is an 83 year old female who presents with adverse effects from Mybetriq.  After starting Mybetriq for urinary incontinence, she experienced headaches, difficulty sleeping, dizziness, heart racing, and night sweats. These symptoms resolved after discontinuing the medication five days ago. Denies dizziness, vertigo, chest pain, or syncope  She takes Effexor  (venlafaxine ) for anxiety and depression.  Patient denies fever, chills, SOB, CP, dyspnea, edema, HA, vision changes, N/V/D, abdominal pain, rash, weight changes, and recent illness or hospitalizations.   ROS  See HPI    Objective:    BP 121/72   Pulse 82   Temp 98.1 F (36.7 C)   Ht 5' 6 (1.676 m)   Wt 167 lb 6.4 oz (75.9 kg)   SpO2 98%   BMI 27.02 kg/m    Physical Exam  General: No acute distress. Awake and conversant.  Eyes: Normal conjunctiva, anicteric. Round symmetric pupils.   Respiratory: CTAB. Respirations are non-labored. No wheezing.  Skin: Warm. No rashes or ulcers.  Psych: Alert and oriented. Cooperative, Appropriate mood and affect, Normal judgment.  CV: RRR. No murmur. No lower extremity edema.  MSK: Normal ambulation.  Neuro:  CN II-XII grossly normal.    No results found for any visits on 09/14/24.      Assessment & Plan:   Problem List Items Addressed This Visit     Depression with anxiety   Other Visit Diagnoses       Urinary incontinence, unspecified type    -  Primary   Relevant Orders   Ambulatory referral to Urology      Urinary incontinence with Myrbetriq  trial discontinued due to side effects. Combination with Effexor  may contribute to symptoms. No recent urology consultation. History of pelvic floor physical therapy. - Refer to urology  - Consider pelvic floor physical  therapy if recommended by urology.    No orders of the defined types were placed in this encounter.   No follow-ups on file.  Remy Voiles L Emaline Karnes, NP

## 2024-10-08 ENCOUNTER — Other Ambulatory Visit: Payer: Self-pay

## 2024-10-09 ENCOUNTER — Other Ambulatory Visit: Payer: Self-pay | Admitting: Family Medicine

## 2024-10-17 ENCOUNTER — Ambulatory Visit: Admitting: Medical

## 2024-10-17 VITALS — BP 120/70 | HR 72 | Temp 98.3°F | Resp 16 | Ht 66.0 in | Wt 166.4 lb

## 2024-10-17 DIAGNOSIS — F418 Other specified anxiety disorders: Secondary | ICD-10-CM

## 2024-10-17 DIAGNOSIS — R350 Frequency of micturition: Secondary | ICD-10-CM | POA: Diagnosis not present

## 2024-10-17 DIAGNOSIS — R410 Disorientation, unspecified: Secondary | ICD-10-CM | POA: Diagnosis not present

## 2024-10-17 DIAGNOSIS — R5383 Other fatigue: Secondary | ICD-10-CM

## 2024-10-17 DIAGNOSIS — F1010 Alcohol abuse, uncomplicated: Secondary | ICD-10-CM | POA: Diagnosis not present

## 2024-10-17 DIAGNOSIS — Z9884 Bariatric surgery status: Secondary | ICD-10-CM

## 2024-10-17 DIAGNOSIS — D649 Anemia, unspecified: Secondary | ICD-10-CM

## 2024-10-17 DIAGNOSIS — R7989 Other specified abnormal findings of blood chemistry: Secondary | ICD-10-CM

## 2024-10-17 MED ORDER — BUSPIRONE HCL 7.5 MG PO TABS: 7.5000 mg | ORAL_TABLET | Freq: Two times a day (BID) | ORAL | 0 refills | Status: DC

## 2024-10-17 NOTE — Patient Instructions (Addendum)
 Alcohol withdrawal syndrome and alcohol use disorder Recent alcohol withdrawal symptoms resolved. No current alcohol cravings. Gastric bypass history - Ordered CBC, liver enzymes, lipase, B12, B1, and metabolic panel. - Advised to report any alcohol cravings.(if restarting)  Anxiety disorder and depression Effexor  150 mg effective for anxiety and depression. Discussed Buspar addition for anxiety, especially social and performance-related. - Continue Effexor  150 mg daily. - Prescribed Buspar 7.5 mg for anxiety. - Monitor mood and anxiety symptoms.  Right-sided soft tissue contusion and hematoma, post-fall Bruising over ribs but no pain  Thigh lump lateral aspect rt leg with palpable knot. Differential includes hematoma versus lipoma. - Consider sports medicine referral for ultrasound if persistent.  Urinary frequency Recent urinary frequency with previous Myrbetriq  use discontinued due to side effects. Flank pain noted. - Ordered urine culture. - Advised to follow up with urology referral.  Anemia, unspecified Anemia with recent bruising. History of anemia requires monitoring. - Ordered CBC. - Ordered iron panel.  Follow up date to be determined after lab review         Referring To Provider Information [facility] [address] [place] [address] [phone]  Referral Start Date: 09/14/2024

## 2024-10-17 NOTE — Progress Notes (Signed)
 Subjective:    Patient ID: Stacy Ayers, female    DOB: 1941/10/24, 83 y.o.   MRN: [id number]  HPI   Stacy Ayers is an 83 year old female who presents with symptoms following abrupt cessation of alcohol consumption.  She stopped drinking alcohol abruptly on past friday, leading to nightmares, head pressure, tinnitus,  brief confusion, insomnia, elevated blood pressure,  and feelings of being overwhelmed. By Monday, symptoms improved with resolved head pressure, improved sleep, and normalized blood pressure.  All other symptoms now resolved.  Her alcohol intake had increased over the past month from a couple of glasses of wine daily to five glasses of wine and a couple of gin tonics daily. She had been abstinent from alcohol for over a year prior until drinking accelerated prior to quitting.  She experienced confusion and fear of a serious health issue when she stopped drinking abruptly. Fatigue and lack of energy affected her ability to volunteer at [employer] briefly.  She takes Effexor  150 mg for anxiety and depression, which she finds helpful, and was concerned about its interaction with alcohol.  She fell two weeks ago, resulting in bruising and a painful knot on her right side, with pain present upon pressure. She monitors the area for changes. Admits alcohol probably played role in the fall.  She experienced achiness in the kidney area over the weekend, which has resolved. She had frequent urination and had been on Myrbetriq , which she stopped due to side effects, including dryness. No current kidney pain.       Review of Systems  Cardiovascular:  Negative for chest pain and palpitations.  Gastrointestinal:  Negative for abdominal pain.  Genitourinary:  Positive for frequency. Negative for vaginal bleeding.  Neurological:  Negative for facial asymmetry, speech difficulty and weakness.  Hematological:  Negative for adenopathy.  Psychiatric/Behavioral:  Positive for  dysphoric mood. Negative for behavioral problems, confusion, decreased concentration, sleep disturbance and suicidal ideas. The patient is nervous/anxious.     Past Medical History:  Diagnosis Date   Abnormal liver function tests    Allergy    Anxiety    Arthritis    knees, lower back    Asthma    Cancer (HCC)    Cataract    left eye growing cataract    Depression    Diabetes mellitus    had gastric bypass DM resolved-    Diverticulosis    GERD (gastroesophageal reflux disease)    Headache    Previous migraines   Heart murmur    History of colon polyps    adenomatous   Hyperlipidemia    past hx- resloved after gastric bypass    Hypertension    Hypothyroidism    IBS (irritable bowel syndrome)    Iron deficiency anemia    Osteoporosis      Social History   Socioeconomic History   Marital status: Single    Spouse name: Not on file   Number of children: 1   Years of education: Not on file   Highest education level: Not on file  Occupational History   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  Tobacco Use   Smoking status: Former    Current packs/day: 0.00    Average packs/day: 0.3 packs/day for 2.0 years (0.6 ttl pk-yrs)    Types: Cigarettes    Start date: 07/11/1960    Quit date: 07/11/1962    Years since quitting: 62.3    Passive exposure: Past  Smokeless tobacco: Never  Vaping Use   Vaping status: Never Used  Substance and Sexual Activity   Alcohol use: Not Currently   Drug use: No   Sexual activity: Yes    Birth control/protection: Surgical    Comment: Hysterectomy  Other Topics Concern   Not on file  Social History Narrative   Daily caffeine   Social Drivers of Health   Financial Resource Strain: Low Risk  (02/15/2022)   Overall Financial Resource Strain (CARDIA)    Difficulty of Paying Living Expenses: Not hard at all  Food Insecurity: No Food Insecurity (02/23/2023)   Hunger Vital Sign    Worried About Running Out of Food in the Last  Year: Never true    Ran Out of Food in the Last Year: Never true  Transportation Needs: No Transportation Needs (02/23/2023)   PRAPARE - Administrator, Civil Service (Medical): No    Lack of Transportation (Non-Medical): No  Physical Activity: Inactive (02/15/2022)   Exercise Vital Sign    Days of Exercise per Week: 0 days    Minutes of Exercise per Session: 0 min  Stress: No Stress Concern Present (02/15/2022)   [employer] of Occupational Health - Occupational Stress Questionnaire    Feeling of Stress : Not at all  Social Connections: Moderately Integrated (02/15/2022)   Social Connection and Isolation Panel    Frequency of Communication with Friends and Family: More than three times a week    Frequency of Social Gatherings with Friends and Family: More than three times a week    Attends Religious Services: More than 4 times per year    Active Member of [employer] or Organizations: Yes    Attends Engineer, Structural: More than 4 times per year    Marital Status: Divorced  Intimate Partner Violence: Not At Risk (02/23/2023)   Humiliation, Afraid, Rape, and Kick questionnaire    Fear of Current or Ex-Partner: No    Emotionally Abused: No    Physically Abused: No    Sexually Abused: No    Past Surgical History:  Procedure Laterality Date   ABDOMINAL HYSTERECTOMY     ABDOMINAL HYSTERECTOMY     APPENDECTOMY     BLADDER REPAIR     BREAST BIOPSY Right    needle core biopsy, benign   CARPAL TUNNEL RELEASE Left    Hand   CATARACT EXTRACTION Left    CHOLECYSTECTOMY     COLONOSCOPY     CORONARY ANGIOPLASTY     GASTRIC BYPASS     POLYPECTOMY     REVERSE SHOULDER ARTHROPLASTY Right 07/15/2020   Procedure: REVERSE SHOULDER ARTHROPLASTY;  Surgeon: Melita Drivers, MD;  Location: [facility];  Service: Orthopedics;  Laterality: Right;    TONSILLECTOMY AND ADENOIDECTOMY     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   Procedure: TOTAL KNEE ARTHROPLASTY;  Surgeon:  Melodi Lerner, MD;  Location: [facility];  Service: Orthopedics;  Laterality: Left;    Family History  Problem Relation Age of Onset   Stroke Mother    Asthma Mother    Anuerysm Mother    Obesity Father    Diabetes Sister    Heart disease Maternal Grandmother    Angina Maternal Grandmother    Suicidality Maternal Grandfather    Cancer Paternal Grandmother    Alzheimer's disease Paternal Grandmother    Alzheimer's disease Paternal Aunt    Colon cancer Neg Hx    Colon polyps Neg Hx    Esophageal cancer  Neg Hx    Rectal cancer Neg Hx    Stomach cancer Neg Hx    Pancreatic cancer Neg Hx    Liver disease Neg Hx     Allergies  Allergen Reactions   Sulfonamide Derivatives Diarrhea   Cocaine     Other reaction(s): Hallucination Sinus irrigation   Montelukast Sodium     UNKNOWN   Penicillins     Local skin reaction.  Tolerated ancef  07/15/2020 Tolerated Cephalosporin Date: 10/20/21.     Pioglitazone     UNKNOWN    Rosiglitazone Maleate     UNKNOWN   Secobarbital Sodium     UNKNOWN   Cefdinir  Palpitations    Possible reaction    Codeine Rash   Doxycycline Nausea Only    REACTION: GI UPSET    Current Outpatient Medications on File Prior to Visit  Medication Sig Dispense Refill   acetaminophen  (TYLENOL ) 650 MG CR tablet Take 650 mg by mouth every 8 (eight) hours as needed for pain.     albuterol  (VENTOLIN  HFA) 108 (90 Base) MCG/ACT inhaler Inhale 2 puffs into the lungs every 6 (six) hours as needed for wheezing or shortness of breath. 18 g 5   CRANBERRY PO Take by mouth.     Cyanocobalamin  (VITAMIN B-12 PO) Take by mouth.     denosumab  (PROLIA ) 60 MG/ML SOSY injection Inject 60 mg into the skin every 6 (six) months. Courier to rheum: [address], [place] [address]. Appt on 08/14/24 1 mL 0   Ferrous Sulfate (IRON PO) Take by mouth.     ferrous sulfate 325 (65 FE) MG tablet Take 325 mg by mouth daily with breakfast.     fluticasone  (FLONASE ) 50 MCG/ACT  nasal spray USE 2 SPRAYS IN EACH NOSTRIL ONCE DAILY 16 g 5   levothyroxine  (SYNTHROID ) 125 MCG tablet Take 1 tablet (125 mcg total) by mouth daily before breakfast. 90 tablet 0   Loperamide HCl (ANTI-DIARRHEAL PO) Take by mouth.     losartan  (COZAAR ) 50 MG tablet TAKE ONE TABLET BY MOUTH DAILY 90 tablet 1   metoprolol  succinate (TOPROL -XL) 25 MG 24 hr tablet TAKE ONE TABLET BY MOUTH TWICE DAILY AFTER meals. Need office visit. 30 tablet 0   metoprolol  tartrate (LOPRESSOR ) 25 MG tablet Take 1 tablet (25 mg total) by mouth 3 (three) times daily as needed (palpitations). 90 tablet 3   ofloxacin  (FLOXIN ) 0.3 % OTIC solution Place 10 drops into the right ear daily. Use for 7 days 5 mL 0   pantoprazole  (PROTONIX ) 40 MG tablet Take 1 tablet (40 mg total) by mouth daily. 90 tablet 0   venlafaxine  XR (EFFEXOR -XR) 150 MG 24 hr capsule Take 1 capsule (150 mg total) by mouth daily with breakfast. 90 capsule 0   VITAMIN D  PO Take by mouth.     Current Facility-Administered Medications on File Prior to Visit  Medication Dose Route Frequency Provider Last Rate Last Admin   [START ON 02/06/2025] denosumab  (PROLIA ) injection 60 mg  60 mg Subcutaneous Q6 months         BP 120/70   Pulse 72   Temp 98.3 F (36.8 C) (Oral)   Resp 16   Ht 5' 6 (1.676 m)   Wt 166 lb 6.4 oz (75.5 kg)   SpO2 97%   BMI 26.86 kg/m        Objective:   Physical Exam  General Mental Status- Alert. General Appearance- Not in acute distress.   Skin General:  Color- Normal Color. Moisture- Normal Moisture.  Neck . No JVD.  Chest and Lung Exam Auscultation: Breath Sounds:-CTA  Cardiovascular Auscultation:Rythm- RRR Murmurs & Other Heart Sounds:Auscultation of the heart reveals- No Murmurs.  Abdomen Inspection:-Inspeection Normal. Palpation/Percussion:Note:No mass. Palpation and Percussion of the abdomen reveal- Non Tender, Non Distended + BS, no rebound or guarding.   Neurologic Cranial Nerve exam:- CN III-XII  intact(No nystagmus), symmetric smile. Strength:- 5/5 equal and symmetric strength both upper and lower extremities.       Assessment & Plan:   Patient Instructions  Alcohol withdrawal syndrome and alcohol use disorder Recent alcohol withdrawal symptoms resolved. No current alcohol cravings. Gastric bypass history - Ordered CBC, liver enzymes, lipase, B12, B1, and metabolic panel. - Advised to report any alcohol cravings.(if restarting)  Anxiety disorder and depression Effexor  150 mg effective for anxiety and depression. Discussed Buspar  addition for anxiety, especially social and performance-related. - Continue Effexor  150 mg daily. - Prescribed Buspar  7.5 mg for anxiety. - Monitor mood and anxiety symptoms.  Right-sided soft tissue contusion and hematoma, post-fall Bruising over ribs but no pain  Thigh lump lateral aspect rt leg with palpable knot. Differential includes hematoma versus lipoma. - Consider sports medicine referral for ultrasound if persistent.  Urinary frequency Recent urinary frequency with previous Myrbetriq  use discontinued due to side effects. Flank pain noted. - Ordered urine culture. - Advised to follow up with urology referral.  Anemia, unspecified Anemia with recent bruising. History of anemia requires monitoring. - Ordered CBC. - Ordered iron panel.  Follow up date to be determined after lab review         Referring To Provider Information [facility] [address] [place] [address] [place] [address] [phone]  Referral Start Date: 09/14/2024       Dallas Maxwell, PA-C   I personally spent a total of 45 minutes in the care of the patient today including performing a medically appropriate exam/evaluation, counseling and educating, placing orders, and documenting clinical information in the EHR. SABRA

## 2024-10-18 ENCOUNTER — Ambulatory Visit: Payer: Self-pay | Admitting: Medical

## 2024-10-18 LAB — CBC WITH DIFFERENTIAL/PLATELET
Basophils Absolute: 0 K/uL (ref 0.0–0.1)
Basophils Relative: 0.4 % (ref 0.0–3.0)
Eosinophils Absolute: 0.1 K/uL (ref 0.0–0.7)
Eosinophils Relative: 2.6 % (ref 0.0–5.0)
HCT: 33.2 % — ABNORMAL LOW (ref 36.0–46.0)
Hemoglobin: 11.4 g/dL — ABNORMAL LOW (ref 12.0–15.0)
Lymphocytes Relative: 33.9 % (ref 12.0–46.0)
Lymphs Abs: 1.8 K/uL (ref 0.7–4.0)
MCHC: 34.4 g/dL (ref 30.0–36.0)
MCV: 101.8 fl — ABNORMAL HIGH (ref 78.0–100.0)
Monocytes Absolute: 0 K/uL — ABNORMAL LOW (ref 0.1–1.0)
Monocytes Relative: 0.2 % — ABNORMAL LOW (ref 3.0–12.0)
Neutro Abs: 3.4 K/uL (ref 1.4–7.7)
Neutrophils Relative %: 62.9 % (ref 43.0–77.0)
Platelets: 197 K/uL (ref 150.0–400.0)
RBC: 3.26 Mil/uL — ABNORMAL LOW (ref 3.87–5.11)
RDW: 14.1 % (ref 11.5–15.5)
WBC: 5.3 K/uL (ref 4.0–10.5)

## 2024-10-18 LAB — COMPREHENSIVE METABOLIC PANEL WITH GFR
ALT: 21 U/L (ref 0–35)
AST: 25 U/L (ref 0–37)
Albumin: 4.2 g/dL (ref 3.5–5.2)
Alkaline Phosphatase: 47 U/L (ref 39–117)
BUN: 10 mg/dL (ref 6–23)
CO2: 30 meq/L (ref 19–32)
Calcium: 9.3 mg/dL (ref 8.4–10.5)
Chloride: 102 meq/L (ref 96–112)
Creatinine, Ser: 0.77 mg/dL (ref 0.40–1.20)
GFR: 72.36 mL/min (ref >60.00)
Glucose, Bld: 107 mg/dL — ABNORMAL HIGH (ref 70–99)
Potassium: 4.2 meq/L (ref 3.5–5.1)
Sodium: 138 meq/L (ref 135–145)
Total Bilirubin: 0.6 mg/dL (ref 0.2–1.2)
Total Protein: 6.3 g/dL (ref 6.0–8.3)

## 2024-10-18 LAB — LIPASE: Lipase: 25 U/L (ref 11.0–59.0)

## 2024-10-18 LAB — URINE CULTURE
MICRO NUMBER:: 17256776
Result:: NO GROWTH
SPECIMEN QUALITY:: ADEQUATE

## 2024-10-18 LAB — VITAMIN B12: Vitamin B-12: 310 pg/mL (ref 211–911)

## 2024-10-18 NOTE — Addendum Note (Signed)
 Addended by: DORINA DALLAS HERO on: 10/18/2024 06:58 PM   Modules accepted: Orders

## 2024-10-22 ENCOUNTER — Other Ambulatory Visit: Payer: Self-pay | Admitting: Cardiology

## 2024-10-22 ENCOUNTER — Other Ambulatory Visit: Payer: Self-pay | Admitting: Family Medicine

## 2024-10-22 ENCOUNTER — Telehealth: Payer: Self-pay | Admitting: Cardiology

## 2024-10-22 DIAGNOSIS — I10 Essential (primary) hypertension: Secondary | ICD-10-CM

## 2024-10-22 DIAGNOSIS — K219 Gastro-esophageal reflux disease without esophagitis: Secondary | ICD-10-CM

## 2024-10-22 LAB — IRON,TIBC AND FERRITIN PANEL
%SAT: 20 % (ref 16–45)
Ferritin: 340 ng/mL — ABNORMAL HIGH (ref 16–288)
Iron: 60 ug/dL (ref 45–160)
TIBC: 293 ug/dL (ref 250–450)

## 2024-10-22 LAB — VITAMIN B1: Vitamin B1 (Thiamine): 9 nmol/L (ref 8–30)

## 2024-10-22 NOTE — Telephone Encounter (Signed)
*  STAT* If patient is at the pharmacy, call can be transferred to refill team.   1. Which medications need to be refilled? (please list name of each medication and dose if known)   metoprolol  succinate (TOPROL -XL) 25 MG 24 hr tablet     2. Would you like to learn more about the convenience, safety, & potential cost savings by using the [facility]? no   3. Are you open to using the [facility] (Type [facility].) No   4. Which pharmacy/location (including street and city if local pharmacy) is medication to be sent to?  [facility] [place], [place] - [address]   5. Do they need a 30 day or 90 day supply? 90 day  Pt has scheduled appt on 12/1

## 2024-10-23 ENCOUNTER — Other Ambulatory Visit (HOSPITAL_COMMUNITY): Payer: Self-pay

## 2024-10-23 ENCOUNTER — Other Ambulatory Visit: Payer: Self-pay | Admitting: Cardiology

## 2024-10-23 MED ORDER — METOPROLOL SUCCINATE ER 25 MG PO TB24
25.0000 mg | ORAL_TABLET | Freq: Two times a day (BID) | ORAL | 0 refills | Status: DC
  Filled 2024-10-23: qty 20, 10d supply, fill #0

## 2024-10-23 NOTE — Telephone Encounter (Signed)
 Pt scheduled to see Dr. Cindie 10/29/24, 10 days supply has been sent to pharmacy.

## 2024-10-28 NOTE — Progress Notes (Unsigned)
  Electrophysiology Office Follow up Visit Note:    Date:  10/29/2024   ID:  Stacy Ayers, DOB 01-27-1941, MRN [id number]  PCP:  Antonio Meth, Stacy SAUNDERS, DO  [facility] Cardiologist:  None  [facility] Electrophysiologist:  OLE ONEIDA HOLTS, MD    Interval History:     Stacy Ayers is a 83 y.o. female who presents for a follow up visit.   The patient Stacy Ayers October 27, 2022.  She was previously on propafenone  but she feels better off this medication.  She did not follow-up after her last appointment with Renee. She is doing well today.  Reports palpitations 2-3 times per month that resolved after she takes an as needed metoprolol .  No sustained episodes.       Past medical, surgical, social and family history were reviewed.  ROS:   Please see the history of present illness.    All other systems reviewed and are negative.  EKGs/Labs/Other Studies Reviewed:    The following studies were reviewed today:          Physical Exam:    VS:  BP 128/70   Pulse 70   Ht 5' 6 (1.676 m)   Wt 165 lb 14.4 oz (75.3 kg)   SpO2 98%   BMI 26.78 kg/m     Wt Readings from Last 3 Encounters:  10/29/24 165 lb 14.4 oz (75.3 kg)  10/17/24 166 lb 6.4 oz (75.5 kg)  09/14/24 167 lb 6.4 oz (75.9 kg)     GEN: no distress CARD: RRR, No MRG RESP: No IWOB. CTAB.      ASSESSMENT:    1. Atrial tachycardia   2. Primary hypertension    PLAN:    In order of problems listed above:  #Atrial tachycardia Minimal symptoms.  Doing better off propafenone   #Hypertension At goal today.  Recommend checking blood pressures 1-2 times per week at home and recording the values.  Recommend bringing these recordings to the primary care physician.  I discussed my upcoming departure from [facility] during today's clinic appointment.  The patient will continue to follow-up with one of my EP partners moving forward.  Follow-up 1 year with EP APP  Signed, Ole Holts, MD, [facility],  [facility] 10/29/2024 3:57 PM    Electrophysiology [facility]

## 2024-10-29 ENCOUNTER — Ambulatory Visit: Attending: Cardiology | Admitting: Cardiology

## 2024-10-29 ENCOUNTER — Encounter: Payer: Self-pay | Admitting: Cardiology

## 2024-10-29 VITALS — BP 128/70 | HR 70 | Ht 66.0 in | Wt 165.9 lb

## 2024-10-29 DIAGNOSIS — I4719 Other supraventricular tachycardia: Secondary | ICD-10-CM

## 2024-10-29 DIAGNOSIS — I10 Essential (primary) hypertension: Secondary | ICD-10-CM

## 2024-10-29 NOTE — Patient Instructions (Signed)

## 2024-11-11 ENCOUNTER — Other Ambulatory Visit: Payer: Self-pay | Admitting: Medical

## 2024-11-11 ENCOUNTER — Other Ambulatory Visit: Payer: Self-pay | Admitting: Cardiology

## 2024-11-11 ENCOUNTER — Other Ambulatory Visit: Payer: Self-pay | Admitting: Family Medicine

## 2024-11-15 ENCOUNTER — Ambulatory Visit

## 2024-11-15 VITALS — Ht 66.0 in | Wt 165.0 lb

## 2024-11-15 DIAGNOSIS — Z0000 Encounter for general adult medical examination without abnormal findings: Secondary | ICD-10-CM | POA: Diagnosis not present

## 2024-11-15 NOTE — Patient Instructions (Addendum)
 [name],  Thank you for taking the time for your Medicare Wellness Visit. I appreciate your continued commitment to your health goals. Please review the care plan we discussed, and feel free to reach out if I can assist you further.  Please note that Annual Wellness Visits do not include a physical exam. Some assessments may be limited, especially if the visit was conducted virtually. If needed, we may recommend an in-person follow-up with your provider.  Ongoing Care Seeing your primary care provider every 3 to 6 months helps us  monitor your health and provide consistent, personalized care.    Referrals If a referral was made during today's visit and you haven't received any updates within two weeks, please contact the referred provider directly to check on the status.  Recommended Screenings:  Health Maintenance  Topic Date Due   Yearly kidney health urinalysis for diabetes  Never done   DTaP/Tdap/Td vaccine (2 - Td or Tdap) 07/11/2021   Complete foot exam   11/03/2023   Hemoglobin A1C  11/21/2024   COVID-19 Vaccine (9 - Moderna risk 2025-26 season) 02/27/2025   Eye exam for diabetics  08/08/2025   Yearly kidney function blood test for diabetes  10/17/2025   Medicare Annual Wellness Visit  11/15/2025   Pneumococcal Vaccine for age over 39  Completed   Flu Shot  Completed   Osteoporosis screening with Bone Density Scan  Completed   Zoster (Shingles) Vaccine  Completed   Meningitis B Vaccine  Aged Out   Hepatitis B Vaccine  Discontinued   Colon Cancer Screening  Discontinued   Hepatitis C Screening  Discontinued       11/15/2024    9:38 AM  Advanced Directives  Does Patient Have a Medical Advance Directive? Yes  Type of Estate Agent of [place];Living will  Does patient want to make changes to medical advance directive? No - Patient declined  Copy of Healthcare Power of Attorney in Chart? Yes - validated most recent copy scanned in chart (See row  information)    Vision: Annual vision screenings are recommended for early detection of glaucoma, cataracts, and diabetic retinopathy. These exams can also reveal signs of chronic conditions such as diabetes and high blood pressure.  Dental: Annual dental screenings help detect early signs of oral cancer, gum disease, and other conditions linked to overall health, including heart disease and diabetes.  Please see the attached documents for additional preventive care recommendations.

## 2024-11-15 NOTE — Progress Notes (Signed)
 Chief Complaint  Patient presents with   Medicare Wellness     Subjective:   Stacy Ayers is a 83 y.o. female who presents for a Medicare Annual Wellness Visit.  Visit info / Clinical Intake: Medicare Wellness Visit Type:: Subsequent Annual Wellness Visit Persons participating in visit and providing information:: patient Medicare Wellness Visit Mode:: Telephone If telephone:: video declined Since this visit was completed virtually, some vitals may be partially provided or unavailable. Missing vitals are due to the limitations of the virtual format.: Documented vitals are patient reported If Telephone or Video please confirm:: I connected with patient using audio/video enable telemedicine. I verified patient identity with two identifiers, discussed telehealth limitations, and patient agreed to proceed. Patient Location:: Home Provider Location:: Office Interpreter Needed?: No Pre-visit prep was completed: yes AWV questionnaire completed by patient prior to visit?: no Living arrangements:: (!) lives alone; in retirement community Patient's Overall Health Status Rating: very good Typical amount of pain: some Does pain affect daily life?: no Are you currently prescribed opioids?: no  Dietary Habits and Nutritional Risks How many meals a day?: (!) 1 Eats fruit and vegetables daily?: yes Most meals are obtained by: having others provide food In the last 2 weeks, have you had any of the following?: none Diabetic:: (!) yes Any non-healing wounds?: no How often do you check your BS?: as needed Would you like to be referred to a Nutritionist or for Diabetic Management? : no  Functional Status Activities of Daily Living (to include ambulation/medication): Independent Ambulation: Independent with device- listed below Home Assistive Devices/Equipment: Contact lenses; Eyeglasses Medication Administration: Independent Home Management (perform basic housework or laundry):  Independent Manage your own finances?: yes Primary transportation is: driving Concerns about vision?: no *vision screening is required for WTM* Concerns about hearing?: no  Fall Screening Falls in the past year?: 1 Number of falls in past year: 0 Was there an injury with Fall?: 1 (Head injury, followed by medical attention) Fall Risk Category Calculator: 2 Patient Fall Risk Level: Moderate Fall Risk  Fall Risk Patient at Risk for Falls Due to: Other (Comment) (Tripped on patio.) Fall risk Follow up: Falls evaluation completed; Education provided  Home and Transportation Safety: All rugs have non-skid backing?: N/A, no rugs All stairs or steps have railings?: N/A, no stairs Grab bars in the bathtub or shower?: yes Have non-skid surface in bathtub or shower?: yes Good home lighting?: yes Regular seat belt use?: yes Hospital stays in the last year:: no  Cognitive Assessment Difficulty concentrating, remembering, or making decisions? : yes Will 6CIT or Mini Cog be Completed: yes What year is it?: 0 points What month is it?: 0 points Give patient an address phrase to remember (5 components): [address] [place]  About what time is it?: 0 points Count backwards from 20 to 1: 0 points Say the months of the year in reverse: 0 points Repeat the address phrase from earlier: 0 points 6 CIT Score: 0 points  Advance Directives (For Healthcare) Does Patient Have a Medical Advance Directive?: Yes Does patient want to make changes to medical advance directive?: No - Patient declined Type of Advance Directive: Healthcare Power of [place]; Living will Copy of Healthcare Power of Attorney in Chart?: Yes - validated most recent copy scanned in chart (See row information) Copy of Living Will in Chart?: Yes - validated most recent copy scanned in chart (See row information)  Reviewed/Updated  Reviewed/Updated: Reviewed All (Medical, Surgical, Family, Medications, Allergies, Care  Teams,  Patient Goals)    Allergies (verified) Sulfonamide derivatives, Cocaine, Montelukast sodium, Penicillins, Pioglitazone, Rosiglitazone maleate, Secobarbital sodium, Cefdinir , Codeine, and Doxycycline   Current Medications (verified) Outpatient Encounter Medications as of 11/15/2024  Medication Sig   acetaminophen  (TYLENOL ) 650 MG CR tablet Take 650 mg by mouth every 8 (eight) hours as needed for pain.   albuterol  (VENTOLIN  HFA) 108 (90 Base) MCG/ACT inhaler Inhale 2 puffs into the lungs every 6 (six) hours as needed for wheezing or shortness of breath.   busPIRone  (BUSPAR ) 7.5 MG tablet Take 1 tablet (7.5 mg total) by mouth 2 (two) times daily.   CRANBERRY PO Take by mouth.   Cyanocobalamin  (VITAMIN B-12 PO) Take by mouth.   denosumab  (PROLIA ) 60 MG/ML SOSY injection Inject 60 mg into the skin every 6 (six) months. Courier to rheum: [address], [address], [place] [address]. Appt on 08/14/24   ferrous sulfate 325 (65 FE) MG tablet Take 325 mg by mouth daily with breakfast.   fluticasone  (FLONASE ) 50 MCG/ACT nasal spray USE 2 SPRAYS IN EACH NOSTRIL ONCE DAILY   Loperamide HCl (ANTI-DIARRHEAL PO) Take by mouth.   losartan  (COZAAR ) 50 MG tablet TAKE ONE TABLET BY MOUTH DAILY   metoprolol  succinate (TOPROL -XL) 25 MG 24 hr tablet Take 1 tablet (25 mg total) by mouth in the morning and at bedtime.   metoprolol  tartrate (LOPRESSOR ) 25 MG tablet Take 1 tablet (25 mg total) by mouth 3 (three) times daily as needed (palpitations).   ofloxacin  (FLOXIN ) 0.3 % OTIC solution Place 10 drops into the right ear daily. Use for 7 days   pantoprazole  (PROTONIX ) 40 MG tablet Take 1 tablet (40 mg total) by mouth daily.   SYNTHROID  125 MCG tablet Take 1 tablet (125 mcg total) by mouth daily before breakfast.   venlafaxine  XR (EFFEXOR -XR) 150 MG 24 hr capsule Take 1 capsule (150 mg total) by mouth daily with breakfast.   VITAMIN D  PO Take by mouth.   Facility-Administered Encounter Medications as of  11/15/2024  Medication   [START ON 02/06/2025] denosumab  (PROLIA ) injection 60 mg    History: Past Medical History:  Diagnosis Date   Abnormal liver function tests    Allergy    Anxiety    Arthritis    knees, lower back    Asthma    Cancer (HCC)    Cataract    left eye growing cataract    Depression    Diabetes mellitus    had gastric bypass DM resolved-    Diverticulosis    GERD (gastroesophageal reflux disease)    Headache    Previous migraines   Heart murmur    History of colon polyps    adenomatous   Hyperlipidemia    past hx- resloved after gastric bypass    Hypertension    Hypothyroidism    IBS (irritable bowel syndrome)    Iron deficiency anemia    Osteoporosis    Past Surgical History:  Procedure Laterality Date   ABDOMINAL HYSTERECTOMY     ABDOMINAL HYSTERECTOMY     APPENDECTOMY     BLADDER REPAIR     BREAST BIOPSY Right    needle core biopsy, benign   CARPAL TUNNEL RELEASE Left    Hand   CATARACT EXTRACTION Left    CHOLECYSTECTOMY     COLONOSCOPY     CORONARY ANGIOPLASTY     GASTRIC BYPASS     POLYPECTOMY     REVERSE SHOULDER ARTHROPLASTY Right 07/15/2020   Procedure: REVERSE SHOULDER  ARTHROPLASTY;  Surgeon: Melita Drivers, MD;  Location: [facility];  Service: Orthopedics;  Laterality: Right;    TONSILLECTOMY AND ADENOIDECTOMY     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   Procedure: TOTAL KNEE ARTHROPLASTY;  Surgeon: Melodi Lerner, MD;  Location: [facility];  Service: Orthopedics;  Laterality: Left;   Family History  Problem Relation Age of Onset   Stroke Mother    Asthma Mother    Anuerysm Mother    Obesity Father    Diabetes Sister    Heart disease Maternal Grandmother    Angina Maternal Grandmother    Suicidality Maternal Grandfather    Cancer Paternal Grandmother    Alzheimer's disease Paternal Grandmother    Alzheimer's disease Paternal Aunt    Colon cancer Neg Hx    Colon polyps Neg Hx    Esophageal cancer Neg Hx    Rectal cancer  Neg Hx    Stomach cancer Neg Hx    Pancreatic cancer Neg Hx    Liver disease Neg Hx    Social History   Occupational History   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  Tobacco Use   Smoking status: Former    Current packs/day: 0.00    Average packs/day: 0.3 packs/day for 2.0 years (0.6 ttl pk-yrs)    Types: Cigarettes    Start date: 07/11/1960    Quit date: 07/11/1962    Years since quitting: 62.3    Passive exposure: Past   Smokeless tobacco: Never  Vaping Use   Vaping status: Never Used  Substance and Sexual Activity   Alcohol use: Not Currently   Drug use: No   Sexual activity: Yes    Birth control/protection: Surgical    Comment: Hysterectomy   Tobacco Counseling Counseling given: No  SDOH Screenings   Food Insecurity: No Food Insecurity (11/15/2024)  Housing: Unknown (11/15/2024)  Transportation Needs: No Transportation Needs (11/15/2024)  Utilities: Not At Risk (11/15/2024)  Alcohol Screen: Low Risk (02/23/2023)  Depression (PHQ2-9): Low Risk (11/15/2024)  Recent Concern: Depression (PHQ2-9) - Medium Risk (10/17/2024)  Financial Resource Strain: Low Risk (02/15/2022)  Physical Activity: Inactive (11/15/2024)  Social Connections: Moderately Integrated (11/15/2024)  Stress: No Stress Concern Present (11/15/2024)  Tobacco Use: Medium Risk (11/15/2024)  Health Literacy: Adequate Health Literacy (11/15/2024)   See flowsheets for full screening details  Depression Screen PHQ 2 & 9 Depression Scale- Over the past 2 weeks, how often have you been bothered by any of the following problems? Little interest or pleasure in doing things: 0 Feeling down, depressed, or hopeless (PHQ Adolescent also includes...irritable): 0 PHQ-2 Total Score: 0 Trouble falling or staying asleep, or sleeping too much: 0 Feeling tired or having little energy: 0 Poor appetite or overeating (PHQ Adolescent also includes...weight loss): 0 Feeling bad about yourself - or that you are a  failure or have let yourself or your family down: 0 Trouble concentrating on things, such as reading the newspaper or watching television (PHQ Adolescent also includes...like school work): 0 Moving or speaking so slowly that other people could have noticed. Or the opposite - being so fidgety or restless that you have been moving around a lot more than usual: 0 Thoughts that you would be better off dead, or of hurting yourself in some way: 0 PHQ-9 Total Score: 0 If you checked off any problems, how difficult have these problems made it for you to do your work, take care of things at home, or get along with other people?: Not difficult  at all  Depression Treatment Depression Interventions/Treatment : Currently on Treatment     Goals Addressed               This Visit's Progress     Increase physical activity (pt-stated)        Remain active.             Objective:    Today's Vitals   11/15/24 0938  Weight: 165 lb (74.8 kg)  Height: 5' 6 (1.676 m)   Body mass index is 26.63 kg/m.  Hearing/Vision screen Hearing Screening - Comments:: Denies hearing difficulties   Vision Screening - Comments:: Wears rx glasses - up to date with routine eye exams with  [facility] Immunizations and Health Maintenance Health Maintenance  Topic Date Due   Diabetic kidney evaluation - Urine ACR  Never done   DTaP/Tdap/Td (2 - Td or Tdap) 07/11/2021   FOOT EXAM  11/03/2023   HEMOGLOBIN A1C  11/21/2024   COVID-19 Vaccine (9 - Moderna risk 2025-26 season) 02/27/2025   OPHTHALMOLOGY EXAM  08/08/2025   Diabetic kidney evaluation - eGFR measurement  10/17/2025   Medicare Annual Wellness (AWV)  11/15/2025   Pneumococcal Vaccine: 50+ Years  Completed   Influenza Vaccine  Completed   Bone Density Scan  Completed   Zoster Vaccines- Shingrix   Completed   Meningococcal B Vaccine  Aged Out   Hepatitis B Vaccines 19-59 Average Risk  Discontinued   Colonoscopy  Discontinued   Hepatitis C  Screening  Discontinued        Assessment/Plan:  This is a routine wellness examination for [place].  Patient Care Team: Antonio Meth, Jamee SAUNDERS, DO as PCP - General Cindie Ole DASEN, MD as PCP - Electrophysiology (Cardiology) Melodi Lerner, MD as Consulting Physician (Orthopedic Surgery) Cleotilde Sewer, OD as Consulting Physician (Optometry) Rosaline Mottinger as Consulting Physician (Dentistry) Cleotilde Sewer, OD (Optometry) Jeannetta, Lonni ORN, MD as Consulting Physician (Rheumatology)  I have personally reviewed and noted the following in the patients chart:   Medical and social history Use of alcohol, tobacco or illicit drugs  Current medications and supplements including opioid prescriptions. Functional ability and status Nutritional status Physical activity Advanced directives List of other physicians Hospitalizations, surgeries, and ER visits in previous 12 months Vitals Screenings to include cognitive, depression, and falls Referrals and appointments  No orders of the defined types were placed in this encounter.  In addition, I have reviewed and discussed with patient certain preventive protocols, quality metrics, and best practice recommendations. A written personalized care plan for preventive services as well as general preventive health recommendations were provided to patient.   Rojelio ORN Blush, LPN   [id number]   Return in 53 weeks (on 11/21/2025).  After Visit Summary: (MyChart) Due to this being a telephonic visit, the after visit summary with patients personalized plan was offered to patient via MyChart   Nurse Notes: No voiced or noted concerns at this time

## 2024-12-16 ENCOUNTER — Other Ambulatory Visit: Payer: Self-pay | Admitting: Medical

## 2025-01-22 ENCOUNTER — Ambulatory Visit: Payer: Medicare PPO | Admitting: Internal Medicine

## 2025-11-21 ENCOUNTER — Ambulatory Visit
